# Patient Record
Sex: Female | Born: 1945 | Race: White | Hispanic: No | Smoking: Current every day smoker
Health system: Southern US, Community
[De-identification: ages and names within clinical notes are randomized; demographics above are authoritative.]

## PROBLEM LIST (undated history)

## (undated) DIAGNOSIS — I10 Essential (primary) hypertension: Secondary | ICD-10-CM

## (undated) DIAGNOSIS — E785 Hyperlipidemia, unspecified: Secondary | ICD-10-CM

## (undated) DIAGNOSIS — F419 Anxiety disorder, unspecified: Secondary | ICD-10-CM

## (undated) DIAGNOSIS — J40 Bronchitis, not specified as acute or chronic: Secondary | ICD-10-CM

## (undated) DIAGNOSIS — M810 Age-related osteoporosis without current pathological fracture: Secondary | ICD-10-CM

## (undated) DIAGNOSIS — H9190 Unspecified hearing loss, unspecified ear: Secondary | ICD-10-CM

## (undated) DIAGNOSIS — G709 Myoneural disorder, unspecified: Secondary | ICD-10-CM

## (undated) DIAGNOSIS — R0600 Dyspnea, unspecified: Secondary | ICD-10-CM

## (undated) DIAGNOSIS — K219 Gastro-esophageal reflux disease without esophagitis: Secondary | ICD-10-CM

## (undated) DIAGNOSIS — J302 Other seasonal allergic rhinitis: Secondary | ICD-10-CM

## (undated) DIAGNOSIS — J449 Chronic obstructive pulmonary disease, unspecified: Secondary | ICD-10-CM

## (undated) HISTORY — PX: TONSILLECTOMY: SUR1361

## (undated) HISTORY — DX: Gastro-esophageal reflux disease without esophagitis: K21.9

## (undated) HISTORY — DX: Hyperlipidemia, unspecified: E78.5

## (undated) HISTORY — PX: TUBAL LIGATION: SHX77

## (undated) HISTORY — DX: Other seasonal allergic rhinitis: J30.2

## (undated) HISTORY — PX: BACK SURGERY: SHX140

---

## 1998-05-27 ENCOUNTER — Other Ambulatory Visit: Admission: RE | Admit: 1998-05-27 | Discharge: 1998-05-27 | Payer: Self-pay | Admitting: Obstetrics and Gynecology

## 1999-05-31 ENCOUNTER — Other Ambulatory Visit: Admission: RE | Admit: 1999-05-31 | Discharge: 1999-05-31 | Payer: Self-pay | Admitting: Obstetrics and Gynecology

## 2000-05-31 ENCOUNTER — Other Ambulatory Visit: Admission: RE | Admit: 2000-05-31 | Discharge: 2000-05-31 | Payer: Self-pay | Admitting: *Deleted

## 2001-06-06 ENCOUNTER — Other Ambulatory Visit: Admission: RE | Admit: 2001-06-06 | Discharge: 2001-06-06 | Payer: Self-pay | Admitting: *Deleted

## 2002-06-12 ENCOUNTER — Other Ambulatory Visit: Admission: RE | Admit: 2002-06-12 | Discharge: 2002-06-12 | Payer: Self-pay | Admitting: Internal Medicine

## 2004-08-29 ENCOUNTER — Ambulatory Visit: Payer: Self-pay | Admitting: Internal Medicine

## 2004-09-28 ENCOUNTER — Ambulatory Visit: Payer: Self-pay | Admitting: Internal Medicine

## 2005-05-03 ENCOUNTER — Ambulatory Visit: Payer: Self-pay | Admitting: Internal Medicine

## 2005-05-23 ENCOUNTER — Ambulatory Visit: Payer: Self-pay | Admitting: Internal Medicine

## 2005-08-21 ENCOUNTER — Ambulatory Visit: Payer: Self-pay | Admitting: Internal Medicine

## 2005-09-09 ENCOUNTER — Emergency Department (HOSPITAL_COMMUNITY): Admission: EM | Admit: 2005-09-09 | Discharge: 2005-09-09 | Payer: Self-pay | Admitting: Family Medicine

## 2007-04-10 ENCOUNTER — Encounter: Admission: RE | Admit: 2007-04-10 | Discharge: 2007-04-10 | Payer: Self-pay | Admitting: Orthopedic Surgery

## 2008-03-21 ENCOUNTER — Emergency Department (HOSPITAL_COMMUNITY): Admission: EM | Admit: 2008-03-21 | Discharge: 2008-03-21 | Payer: Self-pay | Admitting: Emergency Medicine

## 2012-03-24 ENCOUNTER — Encounter (HOSPITAL_COMMUNITY): Payer: Self-pay | Admitting: Family Medicine

## 2012-03-24 ENCOUNTER — Emergency Department (HOSPITAL_COMMUNITY)
Admission: EM | Admit: 2012-03-24 | Discharge: 2012-03-24 | Disposition: A | Discharge status: Home / Self Care | Payer: Medicare Other | Attending: Emergency Medicine | Admitting: Emergency Medicine

## 2012-03-24 ENCOUNTER — Emergency Department (HOSPITAL_COMMUNITY): Payer: Medicare Other

## 2012-03-24 DIAGNOSIS — S92919A Unspecified fracture of unspecified toe(s), initial encounter for closed fracture: Secondary | ICD-10-CM | POA: Insufficient documentation

## 2012-03-24 DIAGNOSIS — X58XXXA Exposure to other specified factors, initial encounter: Secondary | ICD-10-CM | POA: Insufficient documentation

## 2012-03-24 DIAGNOSIS — Y92009 Unspecified place in unspecified non-institutional (private) residence as the place of occurrence of the external cause: Secondary | ICD-10-CM | POA: Insufficient documentation

## 2012-03-24 DIAGNOSIS — F17200 Nicotine dependence, unspecified, uncomplicated: Secondary | ICD-10-CM | POA: Insufficient documentation

## 2012-03-24 DIAGNOSIS — I10 Essential (primary) hypertension: Secondary | ICD-10-CM | POA: Insufficient documentation

## 2012-03-24 DIAGNOSIS — S92514A Nondisplaced fracture of proximal phalanx of right lesser toe(s), initial encounter for closed fracture: Secondary | ICD-10-CM

## 2012-03-24 DIAGNOSIS — J4489 Other specified chronic obstructive pulmonary disease: Secondary | ICD-10-CM | POA: Insufficient documentation

## 2012-03-24 DIAGNOSIS — J449 Chronic obstructive pulmonary disease, unspecified: Secondary | ICD-10-CM | POA: Insufficient documentation

## 2012-03-24 DIAGNOSIS — E7800 Pure hypercholesterolemia, unspecified: Secondary | ICD-10-CM | POA: Insufficient documentation

## 2012-03-24 HISTORY — DX: Essential (primary) hypertension: I10

## 2012-03-24 HISTORY — DX: Chronic obstructive pulmonary disease, unspecified: J44.9

## 2012-03-24 MED ORDER — IBUPROFEN 200 MG PO TABS
600.0000 mg | ORAL_TABLET | Freq: Once | ORAL | Status: AC
  Administered 2012-03-24: 600 mg via ORAL
  Filled 2012-03-24: qty 3

## 2012-03-24 NOTE — Discharge Instructions (Signed)
As we discussed, take ibuprofen 600mg  every 8 hours with food for pain and to reduce swelling. Apply ice to your injured areas for 15-20 minutes three times a day for the next several days to help reduce swelling and inflammation.  Wear the shoe when walking. Change the tape as needed.  Toe Fracture Your caregiver has diagnosed you as having a fractured toe. A toe fracture is a break in the bone of a toe. "Buddy taping" is a way of splinting your broken toe, by taping the broken toe to the toe next to it. This "buddy taping" will keep the injured toe from moving beyond normal range of motion. Buddy taping also helps the toe heal in a more normal alignment. It may take 6 to 8 weeks for the toe injury to heal. HOME CARE INSTRUCTIONS   Leave your toes taped together for as long as directed by your caregiver or until you see a doctor for a follow-up examination. You can change the tape after bathing. Always use a small piece of gauze or cotton between the toes when taping them together. This will help the skin stay dry and prevent infection.   Apply ice to the injury for 15 to 20 minutes each hour while awake for the first 2 days. Put the ice in a plastic bag and place a towel between the bag of ice and your skin.   After the first 2 days, apply heat to the injured area. Use heat for the next 2 to 3 days. Place a heating pad on the foot or soak the foot in warm water as directed by your caregiver.   Keep your foot elevated as much as possible to lessen swelling.   Wear sturdy, supportive shoes. The shoes should not pinch the toes or fit tightly against the toes.   Your caregiver may prescribe a rigid shoe if your foot is very swollen.   Your may be given crutches if the pain is too great and it hurts too much to walk.   Only take over-the-counter or prescription medicines for pain, discomfort, or fever as directed by your caregiver.   If your caregiver has given you a follow-up appointment, it  is very important to keep that appointment. Not keeping the appointment could result in a chronic or permanent injury, pain, and disability. If there is any problem keeping the appointment, you must call back to this facility for assistance.  SEEK MEDICAL CARE IF:   You have increased pain or swelling, not relieved with medications.   The pain does not get better after 1 week.   Your injured toe is cold when the others are warm.  SEEK IMMEDIATE MEDICAL CARE IF:   The toe becomes cold, numb, or white.   The toe becomes hot (inflamed) and red.  Document Released: 09/22/2000 Document Revised: 09/14/2011 Document Reviewed: 05/11/2008 [facility] Patient Information 2012 [place], [place].

## 2012-03-24 NOTE — ED Notes (Signed)
Pt complaining of right foot pain. sts she tripped and fell over a trunk in the hall last night. Pt has right foot pain and swelling.

## 2012-03-24 NOTE — ED Notes (Signed)
NAD noted at time of d/c home 

## 2012-03-24 NOTE — Progress Notes (Signed)
Orthopedic Tech Progress Note Patient Details:  Morgan [name] [birth year] [id number]  Ortho Devices Type of Ortho Device: Buddy tape;Postop boot Ortho Device/Splint Interventions: Application   Shawnie Pons 03/24/2012, 10:17 AM

## 2012-03-24 NOTE — ED Provider Notes (Signed)
History     CSN: [id number]  Arrival date & time 03/24/12  0848   First MD Initiated Contact with Patient 03/24/12 [id number]      Chief Complaint  Patient presents with  . Foot Pain    (Consider location/radiation/quality/duration/timing/severity/associated sxs/prior treatment) The history is provided by the patient.   66 year old female presents to the emergency department with a chief complaint of right foot pain after she tripped over her trunk in her hallway last night. Denies any other injuries sustained in the fall. Was initially able to ambulating but pain has become severe enough that she can no longer do this. Porch associated decreased sensation to the toe but is able to move all toes and the ankle with pain. Denies any wounds, but does endorse color change to the foot. Has taken Excedrin Migraine for the pain with minimal relief. Applied ice this morning.  Past Medical History  Diagnosis Date  . COPD (chronic obstructive pulmonary disease)   . Hypertension   . High cholesterol     Past Surgical History  Procedure Date  . Back surgery     History reviewed. No pertinent family history.  History  Substance Use Topics  . Smoking status: Current Everyday Smoker  . Smokeless tobacco: Not on file  . Alcohol Use: Yes     Review of Systems  Constitutional: Negative for fever.  Musculoskeletal:       See HPI  Skin: Positive for color change. Negative for wound.  Neurological: Positive for numbness. Negative for dizziness, syncope, weakness and headaches.    Allergies  Codeine  Home Medications   Current Outpatient Rx  Name Route Sig Dispense Refill  . ASPIRIN-ACETAMINOPHEN-CAFFEINE 250-250-65 MG PO TABS Oral Take 1 tablet by mouth every 6 (six) hours as needed. For headache    . CITALOPRAM HYDROBROMIDE 20 MG PO TABS Oral Take 20 mg by mouth daily.    Marland Kitchen LISINOPRIL 20 MG PO TABS Oral Take 20 mg by mouth daily.    Marland Kitchen METOPROLOL SUCCINATE ER 25 MG PO TB24 Oral Take 25  mg by mouth daily.    Marland Kitchen OMEPRAZOLE 20 MG PO CPDR Oral Take 20 mg by mouth daily.    Marland Kitchen SIMVASTATIN 40 MG PO TABS Oral Take 40 mg by mouth every evening.      BP 136/70  Pulse 66  Temp 98.3 F (36.8 C) (Oral)  Resp 20  SpO2 95%  Physical Exam  Constitutional: She appears well-developed and well-nourished. No distress.       Vital signs are reviewed and are normal.   HENT:  Head: Normocephalic and atraumatic.  Neck: Neck supple.  Cardiovascular: Normal rate and regular rhythm.        Bilateral radial and DP pulses are 2+   Pulmonary/Chest: No respiratory distress.  Musculoskeletal:       Right knee: no tenderness found.       Left knee: no tenderness found.       Right ankle: Normal. No head of 5th metatarsal tenderness found.       Left ankle: no tenderness.       Right foot: She exhibits tenderness and swelling.       Left foot: She exhibits no tenderness.       Feet:  Neurological: She is alert.       Sensation intact to light touch in BLE (present but altered compared to contralateral over the 5th digit)  Skin: Skin is warm and dry.  ED Course  Procedures (including critical care time)  Labs Reviewed - No data to display Dg Foot Complete Right  03/24/2012  *RADIOLOGY REPORT*  Clinical Data: History of injury complaining of right foot pain.  RIGHT FOOT COMPLETE - 3+ VIEW  Comparison: No priors.  Findings: There is an acute nondisplaced fracture through the proximal phalanx of the fifth digit, with approximately 5 degrees of lateral angulation.  No other acute fracture, subluxation, dislocation, joint or soft tissue abnormality is noted.  IMPRESSION: 1.  Acute nondisplaced minimally angulated fracture through the proximal phalanx of the right fifth toe.  Original Report Authenticated By: Florencia Reasons, M.D.     1. Closed nondisp fx of proximal phalanx of lesser toe of right foot       MDM  Mechanical fall, right foot injury with 3 areas of bruising.  Imaging study reviewed with pt, proximal 5th phalanx fx, non-displaced. Buddy tape, post-op shoe applied. RICE discussed. Pt to be d/c home.        Shaaron Adler, PA-C 03/24/12 1009

## 2012-03-26 NOTE — ED Provider Notes (Signed)
Medical screening examination/treatment/procedure(s) were performed by non-physician practitioner and as supervising physician I was immediately available for consultation/collaboration.  Coulter Oldaker R. Avalynn Bowe, MD 03/26/12 1459 

## 2015-09-08 ENCOUNTER — Encounter: Payer: Self-pay | Admitting: Primary Care

## 2015-09-08 ENCOUNTER — Ambulatory Visit (INDEPENDENT_AMBULATORY_CARE_PROVIDER_SITE_OTHER): Payer: Commercial Managed Care - HMO | Admitting: Primary Care

## 2015-09-08 ENCOUNTER — Ambulatory Visit (INDEPENDENT_AMBULATORY_CARE_PROVIDER_SITE_OTHER)
Admission: RE | Admit: 2015-09-08 | Discharge: 2015-09-08 | Disposition: A | Discharge status: Home / Self Care | Payer: Commercial Managed Care - HMO | Source: Ambulatory Visit | Attending: Primary Care | Admitting: Primary Care

## 2015-09-08 ENCOUNTER — Encounter (INDEPENDENT_AMBULATORY_CARE_PROVIDER_SITE_OTHER): Payer: Self-pay

## 2015-09-08 ENCOUNTER — Telehealth: Payer: Self-pay

## 2015-09-08 VITALS — BP 120/82 | HR 75 | Temp 98.1°F | Ht 61.0 in | Wt 135.1 lb

## 2015-09-08 DIAGNOSIS — N390 Urinary tract infection, site not specified: Secondary | ICD-10-CM | POA: Diagnosis not present

## 2015-09-08 DIAGNOSIS — K219 Gastro-esophageal reflux disease without esophagitis: Secondary | ICD-10-CM | POA: Insufficient documentation

## 2015-09-08 DIAGNOSIS — F329 Major depressive disorder, single episode, unspecified: Secondary | ICD-10-CM | POA: Insufficient documentation

## 2015-09-08 DIAGNOSIS — F32A Depression, unspecified: Secondary | ICD-10-CM | POA: Insufficient documentation

## 2015-09-08 DIAGNOSIS — I10 Essential (primary) hypertension: Secondary | ICD-10-CM | POA: Diagnosis not present

## 2015-09-08 DIAGNOSIS — F419 Anxiety disorder, unspecified: Secondary | ICD-10-CM

## 2015-09-08 DIAGNOSIS — F411 Generalized anxiety disorder: Secondary | ICD-10-CM

## 2015-09-08 DIAGNOSIS — E785 Hyperlipidemia, unspecified: Secondary | ICD-10-CM | POA: Insufficient documentation

## 2015-09-08 DIAGNOSIS — R109 Unspecified abdominal pain: Secondary | ICD-10-CM | POA: Diagnosis not present

## 2015-09-08 LAB — POCT URINALYSIS DIPSTICK
Bilirubin, UA: NEGATIVE
Blood, UA: NEGATIVE
Glucose, UA: NEGATIVE
Nitrite, UA: NEGATIVE
Spec Grav, UA: 1.02
Urobilinogen, UA: NEGATIVE
pH, UA: 6

## 2015-09-08 MED ORDER — CEPHALEXIN 250 MG PO CAPS: 250.0000 mg | ORAL_CAPSULE | Freq: Two times a day (BID) | ORAL | Status: DC

## 2015-09-08 NOTE — Telephone Encounter (Signed)
Pt left v/m keflex not at [facility]; was sent in error to [facility]. Sent electronically to [facility]; apologized to pt. Spoke with Georgina Snell at [place] to cancel rx.-

## 2015-09-08 NOTE — Assessment & Plan Note (Signed)
Managed on omeprazole 40 mg. Discussed risks of decreased bone density. Will obtain records for bone density report.

## 2015-09-08 NOTE — Assessment & Plan Note (Signed)
Stable today. Current managed on toprol XL 25 mg and lisinopril/hctz 10/12.5 mg. Continue current regimen, Will review BMP from records.

## 2015-09-08 NOTE — Assessment & Plan Note (Signed)
Managed on simvastatin 40 mg. Will obtain records for lipid panel.

## 2015-09-08 NOTE — Patient Instructions (Signed)
Start Cephalexin antibiotics. Take 1 capsule by mouth twice daily for 7 days.  Complete xray(s) prior to leaving today. I will contact you regarding your results.  It was a pleasure to meet you today! Please don't hesitate to call me with any questions. Welcome to [facility]!

## 2015-09-08 NOTE — Progress Notes (Signed)
Subjective:    [name], female    DOB: 10/02/1946, 69 y.o.   MRN: [id number]  HPI  Morgan [name] is a 69 year old female who presents today to establish care and discuss the problems mentioned below. Will obtain old records. Her last physical was in September 2016.   1) Flank pain: Her symptoms began 3 weeks ago with left groin and flank pain. She then noticed a foul odor to her urine. She took AZO for 1 week. She then experienced severe, sharp, "labor" pain to her lower back. Over the past 3 weeks she's had a dull, cramping pain to her bilateral groin with intermittent "attacks" of low back pain and nauesa. Overall attacks are less painful. During her first pain attack she had nausea, vomiting, weakness, diaphoresis. She's not had her lower back "attacks" since Sunday this week. She's been taking ibuprofen.   2) Essential Hypertension: Diagnosed 10 years ago. Currently managed on lisinopril/HCTZ 10/12.5 mg and toprol XL 25 mg. Denies chest pain, shortness of breath.  3) Hyperlipidemia: Diagnosed 10 years ago. Currently managed on simvastatin 40 mg.  4) Generalized Anxiety Disorder: Currently managed on Xanax 1 mg from prior PCP, will typically take 1/2 tablet every morning. Feels well managed at this dose.  Review of Systems  Constitutional: Positive for chills. Negative for fever.  HENT: Negative for rhinorrhea.   Respiratory: Negative for cough and shortness of breath.   Cardiovascular: Negative for chest pain.  Gastrointestinal: Positive for nausea. Negative for abdominal pain.  Genitourinary: Positive for frequency and flank pain. Negative for dysuria, urgency, hematuria and vaginal discharge.       Foul odor to her urine  Musculoskeletal:       Chronic back pain  Skin: Negative for rash.  Allergic/Immunologic: Positive for environmental allergies.  Neurological: Positive for dizziness. Negative for numbness.  Psychiatric/Behavioral:       Se HPI       Past  Medical History  Diagnosis Date  . COPD (chronic obstructive pulmonary disease) ([place])   . Hypertension   . Hyperlipidemia   . GERD (gastroesophageal reflux disease)   . Seasonal allergies     Social History   Social History  . Marital Status: Married    Spouse Name: N/A  . Number of Children: N/A  . Years of Education: N/A   Occupational History  . Not on file.   Social History Main Topics  . Smoking status: Current Every Day Smoker  . Smokeless tobacco: Not on file  . Alcohol Use: 0.0 oz/week    0 Standard drinks or equivalent per week     Comment: 2 to 3 beer a day  . Drug Use: Not on file  . Sexual Activity: Not on file   Other Topics Concern  . Not on file   Social History Narrative   Married.   2 children, 3 grandchildren.   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   Enjoys spending time with her family, going to the beach and movies.     Past Surgical History  Procedure Laterality Date  . Back surgery      No family history on file.  Allergies  Allergen Reactions  . Codeine Hives    Makes me crazy    Current Outpatient Prescriptions on File Prior to Visit  Medication Sig Dispense Refill  . metoprolol succinate (TOPROL-XL) 25 MG 24 hr tablet Take 25 mg by mouth daily.    . simvastatin (ZOCOR)  40 MG tablet Take 40 mg by mouth every evening.     No current facility-administered medications on file prior to visit.    BP 120/82 mmHg  Pulse 75  Temp(Src) 98.1 F (36.7 C) (Oral)  Ht 5\' 1"  (1.549 m)  Wt 135 lb 1.9 oz (61.29 kg)  BMI 25.54 kg/m2  SpO2 97%    Objective:   Physical Exam  Constitutional: She is oriented to person, place, and time. She appears well-nourished.  Neck: Neck supple.  Cardiovascular: Normal rate and regular rhythm.   Pulmonary/Chest: Effort normal and breath sounds normal.  Abdominal: Soft. Bowel sounds are normal. There is tenderness in the suprapubic area. There is CVA tenderness.  Neurological: She is alert  and oriented to person, place, and time.  Skin: Skin is warm and dry.  Psychiatric: She has a normal mood and affect.          Assessment & Plan:  Flank pain:  Present for 3 weeks with "attacks" of severe pain with nausea. Overall attacks are improved, last attack Sunday this week. Suspect renal stone as she had some CVA tenderness to right side and based off of HPI. Sounds like she may have passed the stone; however, will get KUB today to rule out any other stone, UA: trace leuks, positive for protein. No blood or nitrites. Will go ahead and treat with antibiotics due to duration and continued tenderness. Culture sent. Fluids, rest. Return precautions provided.

## 2015-09-08 NOTE — Progress Notes (Signed)
Pre visit review using our clinic review tool, if applicable. No additional management support is needed unless otherwise documented below in the visit note. 

## 2015-09-08 NOTE — Assessment & Plan Note (Signed)
Currently managed on 0.5 mg of xanax every morning for years from prior PCP. Feels well managed on this regimen. Will obtain UDS and controlled substance contract for refills.

## 2015-09-10 LAB — URINE CULTURE
Colony Count: NO GROWTH
Organism ID, Bacteria: NO GROWTH

## 2015-09-13 ENCOUNTER — Telehealth: Payer: Self-pay | Admitting: Primary Care

## 2015-09-13 ENCOUNTER — Other Ambulatory Visit (INDEPENDENT_AMBULATORY_CARE_PROVIDER_SITE_OTHER): Payer: Commercial Managed Care - HMO

## 2015-09-13 DIAGNOSIS — R109 Unspecified abdominal pain: Secondary | ICD-10-CM

## 2015-09-13 NOTE — Telephone Encounter (Signed)
Called and notified patient of Kate's comments. Patient verbalized understanding. Patient stated that someone has already called her and have her labs done. She is waiting for a call back.

## 2015-09-13 NOTE — Telephone Encounter (Signed)
Morgan [name], Please notify [name] that we will need to complete a CT scan of her kidneys for further evaluation. She will also needs labs.  [name] or [name], can they do labs at [facility] on [address]? If not she can go to [facility].

## 2015-09-13 NOTE — Telephone Encounter (Signed)
Noted. Will be on the look out for results.

## 2015-09-13 NOTE — Telephone Encounter (Signed)
Pt is bnot feeling better, she had a bad attack on Friday . cb number is [phone] Pt request cb  Thank you

## 2015-09-14 ENCOUNTER — Telehealth: Payer: Self-pay | Admitting: *Deleted

## 2015-09-14 ENCOUNTER — Ambulatory Visit (HOSPITAL_COMMUNITY)
Admission: RE | Admit: 2015-09-14 | Discharge: 2015-09-14 | Disposition: A | Discharge status: Home / Self Care | Payer: Commercial Managed Care - HMO | Source: Ambulatory Visit | Attending: Primary Care | Admitting: Primary Care

## 2015-09-14 ENCOUNTER — Other Ambulatory Visit: Payer: Self-pay | Admitting: Primary Care

## 2015-09-14 DIAGNOSIS — I776 Arteritis, unspecified: Secondary | ICD-10-CM

## 2015-09-14 DIAGNOSIS — R109 Unspecified abdominal pain: Secondary | ICD-10-CM

## 2015-09-14 LAB — COMPREHENSIVE METABOLIC PANEL
ALT: 14 U/L (ref 0–35)
AST: 24 U/L (ref 0–37)
Albumin: 4 g/dL (ref 3.5–5.2)
Alkaline Phosphatase: 80 U/L (ref 39–117)
BUN: 14 mg/dL (ref 6–23)
CO2: 30 mEq/L (ref 19–32)
Calcium: 9.3 mg/dL (ref 8.4–10.5)
Chloride: 97 mEq/L (ref 96–112)
Creatinine, Ser: 0.78 mg/dL (ref 0.40–1.20)
GFR: 78.07 mL/min (ref >60.00)
Glucose, Bld: 189 mg/dL — ABNORMAL HIGH (ref 70–99)
Potassium: 3.7 mEq/L (ref 3.5–5.1)
Sodium: 135 mEq/L (ref 135–145)
Total Bilirubin: 0.3 mg/dL (ref 0.2–1.2)
Total Protein: 6.9 g/dL (ref 6.0–8.3)

## 2015-09-14 LAB — CBC WITH DIFFERENTIAL/PLATELET
Basophils Absolute: 0.1 10*3/uL (ref 0.0–0.1)
Basophils Relative: 0.6 % (ref 0.0–3.0)
Eosinophils Absolute: 0.1 10*3/uL (ref 0.0–0.7)
Eosinophils Relative: 1.3 % (ref 0.0–5.0)
HCT: 36.9 % (ref 36.0–46.0)
Hemoglobin: 12.2 g/dL (ref 12.0–15.0)
Lymphocytes Relative: 32.7 % (ref 12.0–46.0)
Lymphs Abs: 3.3 10*3/uL (ref 0.7–4.0)
MCHC: 33.1 g/dL (ref 30.0–36.0)
MCV: 95.7 fl (ref 78.0–100.0)
Monocytes Absolute: 0.8 10*3/uL (ref 0.1–1.0)
Monocytes Relative: 7.5 % (ref 3.0–12.0)
Neutro Abs: 5.8 10*3/uL (ref 1.4–7.7)
Neutrophils Relative %: 57.9 % (ref 43.0–77.0)
Platelets: 482 10*3/uL — ABNORMAL HIGH (ref 150.0–400.0)
RBC: 3.86 Mil/uL — ABNORMAL LOW (ref 3.87–5.11)
RDW: 12.4 % (ref 11.5–15.5)
WBC: 10.1 10*3/uL (ref 4.0–10.5)

## 2015-09-14 NOTE — Telephone Encounter (Signed)
Notified patient of results. Ordered CTA and blood work for work up of aortitis. Patient verbalized understanding of the plan.

## 2015-09-14 NOTE — Telephone Encounter (Signed)
Call report from [place] at [facility].  CT renal stone study:  IMPRESSION: 1. 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 Recommend CTA follow-up.

## 2015-09-14 NOTE — Telephone Encounter (Signed)
Attempted to contact patient regarding CT results, message left for return call. Will do follow up CTA.

## 2015-09-15 ENCOUNTER — Ambulatory Visit (HOSPITAL_COMMUNITY)
Admission: RE | Admit: 2015-09-15 | Discharge: 2015-09-15 | Disposition: A | Discharge status: Home / Self Care | Payer: Commercial Managed Care - HMO | Source: Ambulatory Visit | Attending: Primary Care | Admitting: Primary Care

## 2015-09-15 ENCOUNTER — Encounter (HOSPITAL_COMMUNITY): Payer: Self-pay

## 2015-09-15 ENCOUNTER — Other Ambulatory Visit (INDEPENDENT_AMBULATORY_CARE_PROVIDER_SITE_OTHER): Payer: Commercial Managed Care - HMO

## 2015-09-15 ENCOUNTER — Other Ambulatory Visit: Payer: Self-pay | Admitting: Primary Care

## 2015-09-15 DIAGNOSIS — R109 Unspecified abdominal pain: Secondary | ICD-10-CM | POA: Diagnosis not present

## 2015-09-15 DIAGNOSIS — I719 Aortic aneurysm of unspecified site, without rupture: Secondary | ICD-10-CM | POA: Diagnosis not present

## 2015-09-15 DIAGNOSIS — K5730 Diverticulosis of large intestine without perforation or abscess without bleeding: Secondary | ICD-10-CM | POA: Diagnosis not present

## 2015-09-15 DIAGNOSIS — I776 Arteritis, unspecified: Secondary | ICD-10-CM

## 2015-09-15 DIAGNOSIS — M4854XA Collapsed vertebra, not elsewhere classified, thoracic region, initial encounter for fracture: Secondary | ICD-10-CM | POA: Insufficient documentation

## 2015-09-15 LAB — RHEUMATOID FACTOR: Rhuematoid fact SerPl-aCnc: <10 IU/mL (ref <=14)

## 2015-09-15 LAB — SEDIMENTATION RATE: Sed Rate: 18 mm/hr (ref 0–22)

## 2015-09-15 MED ORDER — IOHEXOL 350 MG/ML SOLN
100.0000 mL | Freq: Once | INTRAVENOUS | Status: AC | PRN
  Administered 2015-09-15: 100 mL via INTRAVENOUS

## 2015-09-15 NOTE — Progress Notes (Signed)
Report called to Alma Friendly,  NP, Belenda Cruise spoke to patient an advised her of the results.

## 2015-09-16 ENCOUNTER — Encounter: Payer: Self-pay | Admitting: Vascular Surgery

## 2015-09-16 LAB — ANA: Anti Nuclear Antibody(ANA): NEGATIVE

## 2015-09-17 ENCOUNTER — Ambulatory Visit (INDEPENDENT_AMBULATORY_CARE_PROVIDER_SITE_OTHER): Payer: Commercial Managed Care - HMO | Admitting: Vascular Surgery

## 2015-09-17 ENCOUNTER — Encounter: Payer: Self-pay | Admitting: Vascular Surgery

## 2015-09-17 VITALS — BP 153/90 | HR 80 | Ht 61.0 in | Wt 135.4 lb

## 2015-09-17 DIAGNOSIS — I700 Atherosclerosis of aorta: Secondary | ICD-10-CM | POA: Insufficient documentation

## 2015-09-17 NOTE — Addendum Note (Signed)
Addended by: Thresa Ross C on: 09/17/2015 02:09 PM   Modules accepted: Orders

## 2015-09-17 NOTE — Progress Notes (Signed)
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 (12-14-1945) female who presents with chief complaint: "tear in aorta".  Patient was seen in ED with abdominal vs back pain.  CT suggested aortic ulcer.  Subsequent CTA confirmed the diagnosis.  The patient described her pain as severe back/flank pain which has been intermittent without a trigger.  This pain has been described as pressure in the pelvis like a menstrual cramp and also as sharp back pain, like a tear.  This pain improved after vomitting.  The patient denies any fever or chills.  She denies any diarrhea or nausea currently.    Past Medical History  Diagnosis Date  . COPD (chronic obstructive pulmonary disease) ([place])   . Hypertension   . Hyperlipidemia   . GERD (gastroesophageal reflux disease)   . Seasonal allergies     Past Surgical History  Procedure Laterality Date  . Back surgery      Social History   Social History  . Marital Status: Married    Spouse Name: N/A  . Number of Children: N/A  . Years of Education: N/A   Occupational History  . Not on file.   Social History Main Topics  . Smoking status: Current Every Day Smoker -- 1.00 packs/day    Types: Cigarettes  . Smokeless tobacco: Not on file  . Alcohol Use: 0.0 oz/week    0 Standard drinks or equivalent per week     Comment: 2 to 3 beer a day  . Drug Use: No  . Sexual Activity: Not on file   Other Topics Concern  . Not on file   Social History Narrative   Married.   2 children, 3 grandchildren.   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   Enjoys spending time with her family, going to the beach and movies.     Family History  Problem Relation Age of Onset  . Heart disease Father     before age 68    Current Outpatient Prescriptions  Medication Sig Dispense Refill  . ALPRAZolam (XANAX) 1 MG tablet Take  1/2 tablet in the morning and 1/2 in the evening    . cephALEXin (KEFLEX) 250 MG capsule Take 1 capsule (250 mg total) by mouth 2 (two) times daily. 14 capsule 0  . Cholecalciferol (D-3-5) 5000 UNITS capsule Take 5,000 Units by mouth daily.    . diphenhydrAMINE (BENADRYL) 25 mg capsule Take 25 mg by mouth every 6 (six) hours as needed.    Marland Kitchen ibuprofen (ADVIL,MOTRIN) 200 MG tablet Take 200 mg by mouth every 6 (six) hours as needed.    Marland Kitchen lisinopril-hydrochlorothiazide (PRINZIDE,ZESTORETIC) 10-12.5 MG tablet Take 1 tablet by mouth daily.     . metoprolol succinate (TOPROL-XL) 25 MG 24 hr tablet Take 25 mg by mouth daily.    . Multiple Vitamin (MULTIVITAMIN) capsule Take 1 capsule by mouth daily.    Marland Kitchen omeprazole (PRILOSEC) 40 MG capsule Take 40 mg by mouth daily.     . simvastatin (ZOCOR) 40 MG tablet Take 40 mg by mouth every evening.    Marland Kitchen FLUZONE HIGH-DOSE 0.5 ML SUSY      No current facility-administered medications for this visit.     Allergies  Allergen Reactions  . Codeine Hives    Makes me crazy     REVIEW OF SYSTEMS:  (  Positives checked otherwise negative)  CARDIOVASCULAR:   '[ ]'$  chest pain,  $Remo'[ ]'GmwFx$  chest pressure,  $RemoveBe'[ ]'HqXkGHcrZ$  palpitations,  $RemoveBefore'[ ]'wjjzTTQSruafi$  shortness of breath when laying flat,  $Remo'[ ]'RMIWj$  shortness of breath with exertion,   '[ ]'$  pain in feet when walking,  $RemoveB'[ ]'bsoiVcLr$  pain in feet when laying flat, $RemoveBefo'[ ]'afzhzcDGIwk$  history of blood clot in veins (DVT),  $Remov'[ ]'Qfajbn$  history of phlebitis,  $RemoveBef'[ ]'acREafcqAj$  swelling in legs,  $Remo'[ ]'NWveE$  varicose veins  PULMONARY:   '[x]'$  productive cough,  $Remov'[ ]'ZVISbG$  asthma,  $Remove'[x]'EahUoOB$  wheezing  NEUROLOGIC:   '[ ]'$  weakness in arms or legs,  $Remo'[ ]'fDNUD$  numbness in arms or legs,  $Remo'[ ]'malJC$  difficulty speaking or slurred speech,  $Remove'[ ]'AhIiruX$  temporary loss of vision in one eye,  $Rem'[x]'WelM$  dizziness  HEMATOLOGIC:   '[ ]'$  bleeding problems,  $RemoveBe'[ ]'kZYnYDIOQ$  problems with blood clotting too easily  MUSCULOSKEL:   '[ ]'$  joint pain, $RemoveBef'[ ]'WXenWVtPTm$  joint swelling  GASTROINTEST:   '[ ]'$  vomiting blood,  $Remov'[ ]'QemQkq$  blood in stool     GENITOURINARY:   '[ ]'$  burning with  urination,  $RemoveBef'[ ]'TqvKeitzmE$  blood in urine  PSYCHIATRIC:   '[ ]'$  history of major depression  INTEGUMENTARY:   '[ ]'$  rashes,  $Remove'[ ]'rudQHvY$  ulcers  CONSTITUTIONAL:   '[ ]'$  fever,  $Remov'[ ]'jWKDDS$  chills   For VQI Use Only  PRE-ADM LIVING: Home  AMB STATUS: Ambulatory  CAD Sx: None  PRIOR CHF: None  STRESS TEST: $RemoveBefo'[x]'ciPWJOBHYrz$  No, $Re'[ ]'ydY$  Normal, $Remove'[ ]'AZfvpbg$  + ischemia, $RemoveBef'[ ]'JeRMEwgjVV$  + MI, $Rem'[ ]'CjDj$  Both   Physical Examination  Filed Vitals:   09/17/15 0858 09/17/15 0859  BP: 167/85 153/90  Pulse: 80   Height: $Remove'5\' 1"'SximZvd$  (1.549 m)   Weight: 135 lb 6.4 oz (61.417 kg)   SpO2: 99%    Body mass index is 25.6 kg/(m^2).  General: A&O x 3, WDWN  Head: [place]/AT  Ear/Nose/Throat: Hearing grossly intact, nares w/o erythema or drainage, oropharynx w/o Erythema/Exudate, Mallampati score: 3  Eyes: PERRLA, EOMI  Neck: Supple, no nuchal rigidity, no palpable LAD  Pulmonary: Sym exp, good air movt, CTAB, no rales, rhonchi, & wheezing  Cardiac: RRR, Nl S1, S2, no Murmurs, rubs or gallops  Vascular: Vessel Right Left  Radial Palpable Palpable  Brachial Palpable Palpable  Carotid Palpable, without bruit Palpable, without bruit  Aorta Not palpable N/A  Femoral Palpable Palpable  Popliteal Not palpable Not palpable  PT Palpable Palpable  DP Palpable Palpable   Gastrointestinal: soft, NTND, no G/R, no HSM, no masses, no CVAT B, somewhat ticklish on exam, no flank bruits,  Musculoskeletal: M/S 5/5 throughout , Extremities without ischemic changes   Neurologic: CN 2-12 intact , Pain and light touch intact in extremities , Motor exam as listed above  Psychiatric: Judgment intact, Mood & affect appropriate for pt's clinical situation  Dermatologic: See M/S exam for extremity exam, no rashes otherwise noted  Lymph : No Cervical, Axillary, or Inguinal lymphadenopathy   Laboratory: CBC:    Component Value Date/Time   WBC 10.1 09/13/2015 1544   RBC 3.86* 09/13/2015 1544   HGB 12.2 09/13/2015 1544   HCT 36.9 09/13/2015 1544   PLT 482.0* 09/13/2015  1544   MCV 95.7 09/13/2015 1544   MCHC 33.1 09/13/2015 1544   RDW 12.4 09/13/2015 1544   LYMPHSABS 3.3 09/13/2015 1544   MONOABS 0.8 09/13/2015 1544   EOSABS 0.1 09/13/2015 1544   BASOSABS 0.1 09/13/2015 1544    BMP:  Component Value Date/Time   NA 135 09/13/2015 1544   K 3.7 09/13/2015 1544   CL 97 09/13/2015 1544   CO2 30 09/13/2015 1544   GLUCOSE 189* 09/13/2015 1544   BUN 14 09/13/2015 1544   CREATININE 0.78 09/13/2015 1544   CALCIUM 9.3 09/13/2015 1544    Coagulation: No results found for: INR, PROTIME No results found for: PTT  Lipids: No results found for: CHOL, TRIG, HDL, CHOLHDL, VLDL, LDLCALC, LDLDIRECT  ESR 18 ANA: neg RF: <10  CTA abd/pelvis (09/15/15) 1. 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 Favor periaortic hematoma, possibly inflammatory aortitis, less likely retroperitoneal fibrosis in the absence of displacement/narrowing of contiguous branch vessels and IVC. Recommend vascular surgery consultation. 2. Descending and sigmoid diverticulosis. 3. T11 compression fracture deformity, age indeterminate.  Based on my review of the CTA, this patient has non-specific findings of soft tissue inflammation around the immediate infrarenal segment of the aorta with one tiny and one small PAU (~9 mm).  There is no evidence of frank rupture or obvious IMH.     Medical Decision Making  ROBERTO HLAVATY is a 69 y.o. 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  Also an aortic etiology would not improve with vomitting.    There no a big series in the literature in regards to the nature history of PAU, so there are no definitive recommendations from the literature.  From the thoracic PAU data, there are suggestions that PAU > 2.0 cm  require more frequently intervention.  Her current largest PAU is only 9 mm, so immediate interventions are needed.  IMH/PAU are also frequently related, so I would not be surprised if there was some IMH adjacent to the PAU.    Her labwork also is NOT consistent with aortitis.  As the patient remains asx currently, I don't think an aggressive stance is necessary, especially since the open aortic mortality rate remains 5-10% in the medicare data.    I would repeat the CTA abd/pelvis in 6 months if she continues to be sx, otherwise, I would plan repeating it in 1 year.  I discussed in depth with the patient the nature of atherosclerosis, and emphasized the importance of maximal medical management including strict control of blood pressure, blood glucose, and lipid levels, antiplatelet agents, obtaining regular exercise, and cessation of smoking.    The patient is aware that without maximal medical management the underlying atherosclerotic disease process will progress, limiting the benefit of any interventions. The patient is currently on a statin: Zocor. The patient is currently noton an anti-platele.  I suggested she start: ASA 81 mg PO daily. I also strongly suggested she need to stop smoking.  Thank you for allowing [place] to participate in this patient's care.   Adele Barthel, MD [facility] Office: [phone] Pager: [phone]  09/17/2015, 1:23 PM

## 2015-09-20 NOTE — Addendum Note (Signed)
Addended by: Dorthula Rue L on: 09/20/2015 02:53 PM   Modules accepted: Orders

## 2015-10-13 ENCOUNTER — Ambulatory Visit: Payer: Commercial Managed Care - HMO | Admitting: Primary Care

## 2015-10-15 ENCOUNTER — Ambulatory Visit (INDEPENDENT_AMBULATORY_CARE_PROVIDER_SITE_OTHER): Payer: PPO | Admitting: Primary Care

## 2015-10-15 ENCOUNTER — Encounter: Payer: Self-pay | Admitting: Primary Care

## 2015-10-15 VITALS — BP 132/82 | HR 74 | Temp 97.6°F | Ht 61.0 in | Wt 132.0 lb

## 2015-10-15 DIAGNOSIS — J209 Acute bronchitis, unspecified: Secondary | ICD-10-CM | POA: Diagnosis not present

## 2015-10-15 MED ORDER — LISINOPRIL-HYDROCHLOROTHIAZIDE 10-12.5 MG PO TABS: 1.0000 | ORAL_TABLET | Freq: Every day | ORAL | Status: DC

## 2015-10-15 MED ORDER — METOPROLOL SUCCINATE ER 25 MG PO TB24: 25.0000 mg | ORAL_TABLET | Freq: Every day | ORAL | Status: DC

## 2015-10-15 MED ORDER — LEVOFLOXACIN 250 MG PO TABS: 250.0000 mg | ORAL_TABLET | Freq: Every day | ORAL | Status: DC

## 2015-10-15 MED ORDER — ALBUTEROL SULFATE HFA 108 (90 BASE) MCG/ACT IN AERS: 2.0000 | INHALATION_SPRAY | Freq: Four times a day (QID) | RESPIRATORY_TRACT | Status: DC | PRN

## 2015-10-15 MED ORDER — OMEPRAZOLE 40 MG PO CPDR: 40.0000 mg | DELAYED_RELEASE_CAPSULE | Freq: Every day | ORAL | Status: DC

## 2015-10-15 MED ORDER — SIMVASTATIN 40 MG PO TABS: 40.0000 mg | ORAL_TABLET | Freq: Every evening | ORAL | Status: DC

## 2015-10-15 NOTE — Progress Notes (Signed)
Pre visit review using our clinic review tool, if applicable. No additional management support is needed unless otherwise documented below in the visit note. 

## 2015-10-15 NOTE — Patient Instructions (Signed)
Start levofloxacin antibiotics. Take 1 tablet by mouth daily for 5 days.  You may use the albuterol inhaler every 6 hours as needed for wheezing/shortness of breath.  Continue Mucinex DM and Dayquil as needed.  It was a pleasure to see you today!

## 2015-10-15 NOTE — Progress Notes (Signed)
Subjective:    [name], female    DOB: 1946-08-15, 70 y.o.   MRN: [id number]  HPI  Morgan [name] is a 70 year old female who presents today with a chief complaint of cough. She also reports nasal congestion, chest congestion, chills, nausea, wheezing. Denies vomiting, sore throat, headache, fevers. Her cough is productive with yellow sputum. Her symptoms have been present for the past 6 days. She is a current smoker and has history of bacterial bronchitis and pneumonia. She endorses getting infections twice a year and is typically treated with levaquin. She's taken Mucinex DM and Dayquil with temporary relief. Her cough is worse at night.   Review of Systems  Constitutional: Positive for chills and fatigue. Negative for fever.  HENT: Positive for congestion. Negative for sinus pressure and sore throat.   Respiratory: Positive for cough and wheezing.   Gastrointestinal: Positive for nausea. Negative for vomiting.        Past Medical History  Diagnosis Date  . COPD (chronic obstructive pulmonary disease) ([place])   . Hypertension   . Hyperlipidemia   . GERD (gastroesophageal reflux disease)   . Seasonal allergies     Social History   Social History  . Marital Status: Married    Spouse Name: N/A  . Number of Children: N/A  . Years of Education: N/A   Occupational History  . Not on file.   Social History Main Topics  . Smoking status: Current Every Day Smoker -- 1.00 packs/day    Types: Cigarettes  . Smokeless tobacco: Not on file  . Alcohol Use: 0.0 oz/week    0 Standard drinks or equivalent per week     Comment: 2 to 3 beer a day  . Drug Use: No  . Sexual Activity: Not on file   Other Topics Concern  . Not on file   Social History Narrative   Married.   2 children, 3 grandchildren.   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   Enjoys spending time with her family, going to the beach and movies.     Past Surgical History  Procedure Laterality  Date  . Back surgery      Family History  Problem Relation Age of Onset  . Heart disease Father     before age 40    Allergies  Allergen Reactions  . Azithromycin Nausea And Vomiting  . Codeine Hives    Makes me crazy    Current Outpatient Prescriptions on File Prior to Visit  Medication Sig Dispense Refill  . ALPRAZolam (XANAX) 1 MG tablet Take 1/2 tablet in the morning and 1/2 in the evening    . Cholecalciferol (D-3-5) 5000 UNITS capsule Take 5,000 Units by mouth daily.    Marland Kitchen FLUZONE HIGH-DOSE 0.5 ML SUSY     . Multiple Vitamin (MULTIVITAMIN) capsule Take 1 capsule by mouth daily.    . diphenhydrAMINE (BENADRYL) 25 mg capsule Take 25 mg by mouth every 6 (six) hours as needed. Reported on 10/15/2015     No current facility-administered medications on file prior to visit.    BP 132/82 mmHg  Pulse 74  Temp(Src) 97.6 F (36.4 C) (Oral)  Ht 5\' 1"  (1.549 m)  Wt 132 lb (59.875 kg)  BMI 24.95 kg/m2  SpO2 97%    Objective:   Physical Exam  Constitutional: She appears well-nourished.  HENT:  Right Ear: Tympanic membrane and ear canal normal.  Left Ear: Tympanic membrane and ear canal normal.  Nose: Nose normal. Right sinus exhibits no maxillary sinus tenderness and no frontal sinus tenderness. Left sinus exhibits no maxillary sinus tenderness and no frontal sinus tenderness.  Mouth/Throat: Oropharynx is clear and moist.  Eyes: Conjunctivae are normal.  Neck: Neck supple.  Cardiovascular: Normal rate and regular rhythm.   Pulmonary/Chest: She has no wheezes. She has rhonchi in the right upper field, the right lower field, the left upper field and the left lower field. She has rales.  Lymphadenopathy:    She has no cervical adenopathy.  Skin: Skin is warm and dry.          Assessment & Plan:  Acute Bronchitis:  Cough x 6 days, now worse. Productive with yellow sputum. Suspect viral initially, however lungs with rhonchi throughout. No wheezing. Otherwise exam  unremarkable. Productive cough during exam today. Lungs suspicious for bacterial involvement. Will send low dose levaquin. Allergy to zpak. Fluids, Mucinex, rest. Return precautions provided.

## 2015-10-28 ENCOUNTER — Other Ambulatory Visit: Payer: Self-pay | Admitting: Primary Care

## 2015-10-28 ENCOUNTER — Ambulatory Visit (INDEPENDENT_AMBULATORY_CARE_PROVIDER_SITE_OTHER)
Admission: RE | Admit: 2015-10-28 | Discharge: 2015-10-28 | Disposition: A | Discharge status: Home / Self Care | Payer: PPO | Source: Ambulatory Visit | Attending: Primary Care | Admitting: Primary Care

## 2015-10-28 ENCOUNTER — Ambulatory Visit (INDEPENDENT_AMBULATORY_CARE_PROVIDER_SITE_OTHER): Payer: PPO | Admitting: Primary Care

## 2015-10-28 ENCOUNTER — Encounter: Payer: Self-pay | Admitting: Primary Care

## 2015-10-28 VITALS — BP 152/88 | HR 73 | Temp 97.8°F | Ht 61.0 in | Wt 133.8 lb

## 2015-10-28 DIAGNOSIS — M25552 Pain in left hip: Secondary | ICD-10-CM

## 2015-10-28 DIAGNOSIS — M545 Low back pain, unspecified: Secondary | ICD-10-CM

## 2015-10-28 DIAGNOSIS — M549 Dorsalgia, unspecified: Secondary | ICD-10-CM | POA: Insufficient documentation

## 2015-10-28 DIAGNOSIS — M47816 Spondylosis without myelopathy or radiculopathy, lumbar region: Secondary | ICD-10-CM | POA: Diagnosis not present

## 2015-10-28 DIAGNOSIS — M1611 Unilateral primary osteoarthritis, right hip: Secondary | ICD-10-CM | POA: Diagnosis not present

## 2015-10-28 MED ORDER — METHOCARBAMOL 500 MG PO TABS: 250.0000 mg | ORAL_TABLET | Freq: Three times a day (TID) | ORAL | Status: DC | PRN

## 2015-10-28 NOTE — Patient Instructions (Signed)
You may try taking methocarbamol muscle relaxer to help with pain. Take 1/2 to 1 full tablet every 8 hours as needed. Caution as this medication may make you drowsy.  Complete xray(s) prior to leaving today. I will notify you of your results once received.  It was a pleasure to see you today!

## 2015-10-28 NOTE — Progress Notes (Signed)
Pre visit review using our clinic review tool, if applicable. No additional management support is needed unless otherwise documented below in the visit note. 

## 2015-10-28 NOTE — Progress Notes (Signed)
Subjective:    [name], female    DOB: 28-Dec-1945, 70 y.o.   MRN: [id number]  HPI  Morgan [name] is a 70 year old female who presents today with a chief complaint of back pain. Her pain is located to the left lower back with radiation to left hip. Her pain began Friday last week. She will typically take Naproxen and a hot shower with improvement. Yesterday her pain became worse so she's taken advil instead without improvement. Denies radiculopathy, recent injury/trauma. Her pain is worse with sitting still for a period of time. She has a history of chronic back pain with 3 prior surgeries. She has also undergone cortisone injections in the past with mild improvement. Her last xray was unremarkable in 2009.   Review of Systems  Respiratory: Negative for shortness of breath.   Cardiovascular: Negative for chest pain.  Musculoskeletal: Positive for back pain and arthralgias. Negative for joint swelling.  Neurological: Negative for numbness.       Past Medical History  Diagnosis Date  . COPD (chronic obstructive pulmonary disease) ([place])   . Hypertension   . Hyperlipidemia   . GERD (gastroesophageal reflux disease)   . Seasonal allergies     Social History   Social History  . Marital Status: Married    Spouse Name: N/A  . Number of Children: N/A  . Years of Education: N/A   Occupational History  . Not on file.   Social History Main Topics  . Smoking status: Current Every Day Smoker -- 1.00 packs/day    Types: E-cigarettes  . Smokeless tobacco: Not on file  . Alcohol Use: 0.0 oz/week    0 Standard drinks or equivalent per week     Comment: 2 to 3 beer a day  . Drug Use: No  . Sexual Activity: Not on file   Other Topics Concern  . Not on file   Social History Narrative   Married.   2 children, 3 grandchildren.   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   Enjoys spending time with her family, going to the beach and movies.     Past Surgical  History  Procedure Laterality Date  . Back surgery      Family History  Problem Relation Age of Onset  . Heart disease Father     before age 31    Allergies  Allergen Reactions  . Azithromycin Nausea And Vomiting  . Codeine Hives    Makes me crazy    Current Outpatient Prescriptions on File Prior to Visit  Medication Sig Dispense Refill  . albuterol (PROVENTIL HFA;VENTOLIN HFA) 108 (90 Base) MCG/ACT inhaler Inhale 2 puffs into the lungs every 6 (six) hours as needed for wheezing or shortness of breath. 1 Inhaler 2  . ALPRAZolam (XANAX) 1 MG tablet Take 1/2 tablet in the morning and 1/2 in the evening    . Cholecalciferol (D-3-5) 5000 UNITS capsule Take 5,000 Units by mouth daily.    Marland Kitchen lisinopril-hydrochlorothiazide (PRINZIDE,ZESTORETIC) 10-12.5 MG tablet Take 1 tablet by mouth daily. 30 tablet 1  . metoprolol succinate (TOPROL-XL) 25 MG 24 hr tablet Take 1 tablet (25 mg total) by mouth daily. 30 tablet 1  . Multiple Vitamin (MULTIVITAMIN) capsule Take 1 capsule by mouth daily.    . naproxen sodium (ANAPROX) 220 MG tablet Take 220 mg by mouth as needed.    Marland Kitchen omeprazole (PRILOSEC) 40 MG capsule Take 1 capsule (40 mg total) by mouth daily. 30 capsule  1  . simvastatin (ZOCOR) 40 MG tablet Take 1 tablet (40 mg total) by mouth every evening. 30 tablet 1   No current facility-administered medications on file prior to visit.    BP 152/88 mmHg  Pulse 73  Temp(Src) 97.8 F (36.6 C) (Oral)  Ht 5\' 1"  (1.549 m)  Wt 133 lb 12.8 oz (60.691 kg)  BMI 25.29 kg/m2  SpO2 99%    Objective:   Physical Exam  Constitutional: She appears well-nourished.  Cardiovascular: Normal rate and regular rhythm.   Pulmonary/Chest: Effort normal and breath sounds normal.  Musculoskeletal:       Left hip: She exhibits decreased range of motion. She exhibits normal strength and no tenderness.       Lumbar back: She exhibits decreased range of motion, tenderness and pain.  Decrease PROM to left  hip. Pain to lower back with straight leg raise in supine position.  Skin: Skin is warm and dry.          Assessment & Plan:

## 2015-10-28 NOTE — Assessment & Plan Note (Signed)
Chronic, history of 3 prior sugeries. Today with acute exacerbation. Last xrays in 2009. Pain also to left hip. Will obtain lumbar and left hip images. Rx for methocarbamol provided for possible muscle involvement. Will consider send to neurosurgery for evaluation given history.

## 2015-11-01 ENCOUNTER — Other Ambulatory Visit: Payer: Self-pay | Admitting: Primary Care

## 2015-11-01 DIAGNOSIS — M5442 Lumbago with sciatica, left side: Secondary | ICD-10-CM

## 2015-11-01 DIAGNOSIS — M5441 Lumbago with sciatica, right side: Secondary | ICD-10-CM

## 2015-11-08 ENCOUNTER — Telehealth: Payer: Self-pay | Admitting: Primary Care

## 2015-11-08 DIAGNOSIS — M5441 Lumbago with sciatica, right side: Secondary | ICD-10-CM

## 2015-11-08 DIAGNOSIS — M5442 Lumbago with sciatica, left side: Secondary | ICD-10-CM

## 2015-11-08 NOTE — Telephone Encounter (Signed)
Noted. MRI placed.

## 2015-11-08 NOTE — Telephone Encounter (Signed)
Patient called to let you know that she saw Dr Claudean Kinds Orthopedic Surgeon in [place], [place] who did her previous back surgeries last Wednesday. He doesn't accept her insurance and he told her she should have her PCP order an MRI and then send it to [facility] to the Attn of Dr Kristeen Miss. They have brought all her records from past Surgeries over to [facility] and faxed over the office note from last Wednesday's appt with Dr Claudean Kinds. She is asking if you will please order an MRI to help get her an appt with Dr Ellene Route. Please place MRI order.

## 2015-11-10 ENCOUNTER — Ambulatory Visit
Admission: RE | Admit: 2015-11-10 | Discharge: 2015-11-10 | Disposition: A | Discharge status: Home / Self Care | Payer: PPO | Source: Ambulatory Visit | Attending: Primary Care | Admitting: Primary Care

## 2015-11-10 DIAGNOSIS — M5442 Lumbago with sciatica, left side: Secondary | ICD-10-CM

## 2015-11-10 DIAGNOSIS — M5441 Lumbago with sciatica, right side: Secondary | ICD-10-CM

## 2015-11-10 DIAGNOSIS — M4806 Spinal stenosis, lumbar region: Secondary | ICD-10-CM | POA: Diagnosis not present

## 2015-11-15 ENCOUNTER — Encounter: Payer: Self-pay | Admitting: *Deleted

## 2015-12-02 ENCOUNTER — Telehealth: Payer: Self-pay

## 2015-12-02 NOTE — Telephone Encounter (Signed)
Pt wants to change her mail order pharmacy; pt has changed to [employer] ins. Pt will cb with name of mail order pharmacy and names of meds needing refill.

## 2015-12-06 ENCOUNTER — Other Ambulatory Visit: Payer: Self-pay | Admitting: Primary Care

## 2015-12-06 DIAGNOSIS — K219 Gastro-esophageal reflux disease without esophagitis: Secondary | ICD-10-CM

## 2015-12-06 DIAGNOSIS — I10 Essential (primary) hypertension: Secondary | ICD-10-CM

## 2015-12-06 DIAGNOSIS — E785 Hyperlipidemia, unspecified: Secondary | ICD-10-CM

## 2015-12-06 NOTE — Telephone Encounter (Signed)
Morgan [name], Patient called today and left a voicemail stating she is ready to use the mail order pharmacy.  She would like for you to call her back so you can get the phone and fax info to add into her chart as her new pharmacy. Please call patient [phone]

## 2015-12-07 MED ORDER — LISINOPRIL-HYDROCHLOROTHIAZIDE 10-12.5 MG PO TABS: 1.0000 | ORAL_TABLET | Freq: Every day | ORAL | Status: DC

## 2015-12-07 MED ORDER — METOPROLOL SUCCINATE ER 25 MG PO TB24: 25.0000 mg | ORAL_TABLET | Freq: Every day | ORAL | Status: DC

## 2015-12-07 MED ORDER — OMEPRAZOLE 40 MG PO CPDR: 40.0000 mg | DELAYED_RELEASE_CAPSULE | Freq: Every day | ORAL | Status: DC

## 2015-12-07 MED ORDER — SIMVASTATIN 40 MG PO TABS: 40.0000 mg | ORAL_TABLET | Freq: Every evening | ORAL | Status: DC

## 2015-12-07 NOTE — Telephone Encounter (Signed)
Called and spoken to patient. I have added the [facility] mail order pharmacy in her list. Patient also request to refill her medications. Ok to refill? Last seen on 10/28/2015. No future appointment.

## 2015-12-09 DIAGNOSIS — M810 Age-related osteoporosis without current pathological fracture: Secondary | ICD-10-CM | POA: Diagnosis not present

## 2015-12-09 DIAGNOSIS — M4120 Other idiopathic scoliosis, site unspecified: Secondary | ICD-10-CM | POA: Diagnosis not present

## 2015-12-09 DIAGNOSIS — M419 Scoliosis, unspecified: Secondary | ICD-10-CM | POA: Insufficient documentation

## 2015-12-09 DIAGNOSIS — M5416 Radiculopathy, lumbar region: Secondary | ICD-10-CM | POA: Diagnosis not present

## 2015-12-09 DIAGNOSIS — M545 Low back pain: Secondary | ICD-10-CM | POA: Diagnosis not present

## 2015-12-10 ENCOUNTER — Other Ambulatory Visit (HOSPITAL_COMMUNITY): Payer: Self-pay | Admitting: Neurological Surgery

## 2015-12-10 ENCOUNTER — Other Ambulatory Visit: Payer: Self-pay | Admitting: Neurological Surgery

## 2015-12-10 DIAGNOSIS — M4120 Other idiopathic scoliosis, site unspecified: Secondary | ICD-10-CM

## 2015-12-10 DIAGNOSIS — R5381 Other malaise: Secondary | ICD-10-CM

## 2015-12-20 ENCOUNTER — Other Ambulatory Visit: Payer: Self-pay | Admitting: Neurological Surgery

## 2015-12-20 ENCOUNTER — Other Ambulatory Visit: Payer: Self-pay

## 2015-12-20 DIAGNOSIS — M810 Age-related osteoporosis without current pathological fracture: Secondary | ICD-10-CM

## 2015-12-21 ENCOUNTER — Ambulatory Visit
Admission: RE | Admit: 2015-12-21 | Discharge: 2015-12-21 | Disposition: A | Discharge status: Home / Self Care | Payer: PPO | Source: Ambulatory Visit | Attending: Neurological Surgery | Admitting: Neurological Surgery

## 2015-12-21 DIAGNOSIS — M810 Age-related osteoporosis without current pathological fracture: Secondary | ICD-10-CM | POA: Diagnosis not present

## 2015-12-23 ENCOUNTER — Ambulatory Visit (HOSPITAL_COMMUNITY)
Admission: RE | Admit: 2015-12-23 | Discharge: 2015-12-23 | Disposition: A | Discharge status: Home / Self Care | Payer: PPO | Source: Ambulatory Visit | Attending: Neurological Surgery | Admitting: Neurological Surgery

## 2015-12-23 ENCOUNTER — Other Ambulatory Visit: Payer: PPO

## 2015-12-23 DIAGNOSIS — M4804 Spinal stenosis, thoracic region: Secondary | ICD-10-CM | POA: Insufficient documentation

## 2015-12-23 DIAGNOSIS — Z981 Arthrodesis status: Secondary | ICD-10-CM | POA: Diagnosis not present

## 2015-12-23 DIAGNOSIS — I700 Atherosclerosis of aorta: Secondary | ICD-10-CM | POA: Insufficient documentation

## 2015-12-23 DIAGNOSIS — M5144 Schmorl's nodes, thoracic region: Secondary | ICD-10-CM | POA: Insufficient documentation

## 2015-12-23 DIAGNOSIS — M5124 Other intervertebral disc displacement, thoracic region: Secondary | ICD-10-CM | POA: Diagnosis not present

## 2015-12-23 DIAGNOSIS — M4802 Spinal stenosis, cervical region: Secondary | ICD-10-CM | POA: Insufficient documentation

## 2015-12-23 DIAGNOSIS — M4716 Other spondylosis with myelopathy, lumbar region: Secondary | ICD-10-CM | POA: Diagnosis not present

## 2015-12-23 DIAGNOSIS — I77811 Abdominal aortic ectasia: Secondary | ICD-10-CM | POA: Insufficient documentation

## 2015-12-23 DIAGNOSIS — M2578 Osteophyte, vertebrae: Secondary | ICD-10-CM | POA: Diagnosis not present

## 2015-12-23 DIAGNOSIS — M4120 Other idiopathic scoliosis, site unspecified: Secondary | ICD-10-CM

## 2015-12-23 DIAGNOSIS — M419 Scoliosis, unspecified: Secondary | ICD-10-CM | POA: Diagnosis not present

## 2015-12-23 DIAGNOSIS — M4806 Spinal stenosis, lumbar region: Secondary | ICD-10-CM | POA: Insufficient documentation

## 2015-12-23 DIAGNOSIS — M50223 Other cervical disc displacement at C6-C7 level: Secondary | ICD-10-CM | POA: Diagnosis not present

## 2015-12-23 MED ORDER — DIAZEPAM 5 MG PO TABS
10.0000 mg | ORAL_TABLET | Freq: Once | ORAL | Status: AC
  Administered 2015-12-23: 10 mg via ORAL
  Filled 2015-12-23: qty 2

## 2015-12-23 MED ORDER — HYDROCODONE-ACETAMINOPHEN 5-325 MG PO TABS
ORAL_TABLET | ORAL | Status: AC
Start: 2015-12-23 — End: 2015-12-23
  Filled 2015-12-23: qty 2

## 2015-12-23 MED ORDER — DEXAMETHASONE 4 MG PO TABS
4.0000 mg | ORAL_TABLET | Freq: Once | ORAL | Status: DC
  Filled 2015-12-23: qty 1

## 2015-12-23 MED ORDER — IOHEXOL 300 MG/ML  SOLN
10.0000 mL | Freq: Once | INTRAMUSCULAR | Status: AC | PRN
  Administered 2015-12-23: 10 mL via INTRATHECAL

## 2015-12-23 MED ORDER — DIAZEPAM 5 MG PO TABS
ORAL_TABLET | ORAL | Status: AC
  Filled 2015-12-23: qty 2

## 2015-12-23 MED ORDER — ONDANSETRON HCL 4 MG/2ML IJ SOLN: 4.0000 mg | Freq: Four times a day (QID) | INTRAMUSCULAR | Status: DC | PRN

## 2015-12-23 MED ORDER — HYDROCODONE-ACETAMINOPHEN 5-325 MG PO TABS
1.0000 | ORAL_TABLET | ORAL | Status: DC | PRN
  Administered 2015-12-23: 2 via ORAL

## 2015-12-23 MED ORDER — LIDOCAINE HCL (PF) 1 % IJ SOLN
INTRAMUSCULAR | Status: AC
  Administered 2015-12-23: 5 mL via INTRAMUSCULAR
  Filled 2015-12-23: qty 5

## 2015-12-23 NOTE — Discharge Instructions (Signed)
Myelography, Care After °These instructions give you information on caring for yourself after your procedure. Your doctor may also give you more specific instructions. Call your doctor if you have any problems or questions after your procedure. °HOME CARE °· Rest the first day. °· When you rest, lie flat, with your head slightly raised (elevated). °· Avoid heavy lifting and activity for 48 hours, or as told by your doctor. °· You may take the bandage (dressing) off one day after the test, or as told by your doctor. °· Take all medicines only as told by your doctor. °· Ask your doctor when it is okay to take a shower or bath. °· Ask your doctor when your test results will be ready and how you can get them. Make sure you follow up and get your results. °· Do not drink alcohol for 24 hours, or as told by your doctor. °· Drink enough fluid to keep your pee (urine) clear or pale yellow. °GET HELP IF:  °· You have a fever. °· You have a headache. °· You feel sick to your stomach (nauseous) or throw up (vomit). °· You have pain or cramping in your belly (abdomen). °GET HELP RIGHT AWAY IF:  °· You have a headache with a stiff neck or fever. °· You have trouble breathing. °· Any of the places where the needles were put in are: °¨ Puffy (swollen) or red. °¨ Sore or hot to the touch. °¨ Draining yellowish-white fluid (pus). °¨ Bleeding. °MAKE SURE YOU: °· Understand these instructions. °· Will watch your condition. °· Will get help right away if you are not doing well or get worse. °  °This information is not intended to replace advice given to you by your health care provider. Make sure you discuss any questions you have with your health care provider. °  °Document Released: 07/04/2008 Document Revised: 10/16/2014 Document Reviewed: 06/19/2012 °Elsevier Interactive Patient Education ©2016 [employer] ° °

## 2015-12-23 NOTE — Procedures (Signed)
Patient is a 70 year old individual who's had significant problems with low back pain chronically she has a degenerative scoliosis that has been present for a number of years she underwent a fusion from L4 to the sacrum but now has additional degenerative changes above this with additional scoliosis measuring 60 Cobb angle at the thoracic lumbar junction she's been feeling progressive weakness in her lower extremities and MRI demonstrates that she has substantial metallic artifact from previous hardware and because of concerns of myelopathic changes throughout the thoracic spine is been advised she undergo a total myelogram.  Pre op Dx: Spondylosis with myelopathy, scoliosis, status post decompression fusion L4 to sacrum Post op Dx: Spondylosis with myelopathy, scoliosis, status post decompression and fusion L4 to sacrum Procedure: Total myelogram Surgeon: Artrice Kraker Puncture level: L3-4 Fluid color: Clear, colorless Injection: Iohexol 300, 10 mL Findings: Moderate areas of stenosis and upper lumbar spine. Severe thoracic kyphosis, poor visualization of the cervical spine will be evaluated with CAT scan in

## 2015-12-29 DIAGNOSIS — M810 Age-related osteoporosis without current pathological fracture: Secondary | ICD-10-CM | POA: Diagnosis not present

## 2015-12-29 DIAGNOSIS — M4120 Other idiopathic scoliosis, site unspecified: Secondary | ICD-10-CM | POA: Diagnosis not present

## 2015-12-31 DIAGNOSIS — M810 Age-related osteoporosis without current pathological fracture: Secondary | ICD-10-CM | POA: Diagnosis not present

## 2015-12-31 DIAGNOSIS — E559 Vitamin D deficiency, unspecified: Secondary | ICD-10-CM | POA: Diagnosis not present

## 2015-12-31 DIAGNOSIS — R5383 Other fatigue: Secondary | ICD-10-CM | POA: Diagnosis not present

## 2016-01-05 DIAGNOSIS — M810 Age-related osteoporosis without current pathological fracture: Secondary | ICD-10-CM | POA: Diagnosis not present

## 2016-02-02 DIAGNOSIS — M810 Age-related osteoporosis without current pathological fracture: Secondary | ICD-10-CM | POA: Diagnosis not present

## 2016-02-16 DIAGNOSIS — M810 Age-related osteoporosis without current pathological fracture: Secondary | ICD-10-CM | POA: Diagnosis not present

## 2016-02-25 DIAGNOSIS — M810 Age-related osteoporosis without current pathological fracture: Secondary | ICD-10-CM | POA: Diagnosis not present

## 2016-02-29 ENCOUNTER — Ambulatory Visit (INDEPENDENT_AMBULATORY_CARE_PROVIDER_SITE_OTHER): Payer: PPO | Admitting: Primary Care

## 2016-02-29 VITALS — BP 146/82 | HR 71 | Temp 97.5°F | Ht 60.0 in | Wt 125.0 lb

## 2016-02-29 DIAGNOSIS — F411 Generalized anxiety disorder: Secondary | ICD-10-CM

## 2016-02-29 DIAGNOSIS — K219 Gastro-esophageal reflux disease without esophagitis: Secondary | ICD-10-CM

## 2016-02-29 MED ORDER — ALPRAZOLAM 0.5 MG PO TABS: ORAL_TABLET | ORAL | Status: DC

## 2016-02-29 MED ORDER — VENLAFAXINE HCL ER 37.5 MG PO CP24: 37.5000 mg | ORAL_CAPSULE | Freq: Every day | ORAL | Status: DC

## 2016-02-29 MED ORDER — RANITIDINE HCL 150 MG PO TABS: 150.0000 mg | ORAL_TABLET | Freq: Two times a day (BID) | ORAL | Status: DC

## 2016-02-29 NOTE — Patient Instructions (Signed)
Start Effexor XR 37.5 mg (Venlafaxine) capsules everyday for anxiety. Take 1 capsule by mouth every morning with breakfast.  We are slowly weaning you off of your Xanax. Take 1/2 tablet by mouth twice daily as needed for 1 week, then 1/2 tablet once daily as needed for 1 week, then 1/2 tablet by mouth every other day as needed for 2 weeks, then stop.  It takes about 4-6 weeks for the Effexor to take full effect.   Start Zantac (ranitidine) 150 mg twice daily for acid reflux. Do not take omeprazole. Please notify me if you develop acid reflux symptoms after you've stopped the omeprazole.  Follow up in 4-6 weeks for re-evaluation of anxiety and acid reflux.  It was a pleasure to see you today!

## 2016-02-29 NOTE — Progress Notes (Signed)
Subjective:    [name], female    DOB: 1946/04/21, 70 y.o.   MRN: [id number]  HPI  Morgan [name] is a 70 year old female who presents today with a chief complaint of anxiety. She is currently managed on 0.5 mg of Alprazolam twice daily from her prior PCP. She's been experiencing an increased amount of anxiety as she is currently experiencing chronic back and and is following with neurosurgery and orthopedics. She needs surgery but her bones are too brittle. She underwent her first Prolia injection last week.   She was once managed on Prozac, Paxil, and Cymbalta numerous years ago. She stopped taking these medication as it caused weight gain. She is open to weaning off her Xanax and would like to start something more stable and less addicting. GAD 7 score of 15 today.   Review of Systems  Respiratory: Negative for shortness of breath.   Cardiovascular: Negative for chest pain.  Psychiatric/Behavioral: Negative for suicidal ideas and sleep disturbance. The patient is nervous/anxious.        Past Medical History  Diagnosis Date  . COPD (chronic obstructive pulmonary disease) ([place])   . Hypertension   . Hyperlipidemia   . GERD (gastroesophageal reflux disease)   . Seasonal allergies      Social History   Social History  . Marital Status: Married    Spouse Name: N/A  . Number of Children: N/A  . Years of Education: N/A   Occupational History  . Not on file.   Social History Main Topics  . Smoking status: Current Every Day Smoker -- 1.00 packs/day    Types: E-cigarettes  . Smokeless tobacco: Not on file  . Alcohol Use: 0.0 oz/week    0 Standard drinks or equivalent per week     Comment: 2 to 3 beer a day  . Drug Use: No  . Sexual Activity: Not on file   Other Topics Concern  . Not on file   Social History Narrative   Married.   2 children, 3 grandchildren.   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   Enjoys spending time with her family, going to  the beach and movies.     Past Surgical History  Procedure Laterality Date  . Back surgery      Family History  Problem Relation Age of Onset  . Heart disease Father     before age 59    Allergies  Allergen Reactions  . Azithromycin Nausea And Vomiting  . Codeine Itching    Makes me crazy    Current Outpatient Prescriptions on File Prior to Visit  Medication Sig Dispense Refill  . Cholecalciferol (D-3-5) 5000 UNITS capsule Take 5,000 Units by mouth daily.    Marland Kitchen ibuprofen (ADVIL,MOTRIN) 200 MG tablet Take 400-600 mg by mouth every 8 (eight) hours as needed for mild pain or moderate pain.    Marland Kitchen lisinopril-hydrochlorothiazide (PRINZIDE,ZESTORETIC) 10-12.5 MG tablet Take 1 tablet by mouth daily. 90 tablet 1  . metoprolol succinate (TOPROL-XL) 25 MG 24 hr tablet Take 1 tablet (25 mg total) by mouth daily. 90 tablet 1  . Multiple Vitamins-Minerals (MULTIVITAMIN GUMMIES ADULTS) CHEW Chew 2 each by mouth daily.    Marland Kitchen omeprazole (PRILOSEC) 40 MG capsule Take 1 capsule (40 mg total) by mouth daily. 90 capsule 1  . simvastatin (ZOCOR) 40 MG tablet Take 1 tablet (40 mg total) by mouth every evening. 90 tablet 1  . albuterol (PROVENTIL HFA;VENTOLIN HFA) 108 (90 Base)  MCG/ACT inhaler Inhale 2 puffs into the lungs every 6 (six) hours as needed for wheezing or shortness of breath. (Patient not taking: Reported on 02/29/2016) 1 Inhaler 2   No current facility-administered medications on file prior to visit.    BP 146/82 mmHg  Pulse 71  Temp(Src) 97.5 F (36.4 C) (Oral)  Ht 5' (1.524 m)  Wt 125 lb (56.7 kg)  BMI 24.41 kg/m2  SpO2 97%    Objective:   Physical Exam  Constitutional: She appears well-nourished.  Cardiovascular: Normal rate and regular rhythm.   Pulmonary/Chest: Effort normal and breath sounds normal.  Skin: Skin is warm and dry.  Psychiatric: She has a normal mood and affect.          Assessment & Plan:

## 2016-02-29 NOTE — Assessment & Plan Note (Signed)
Increased anxiety as she is dealing with chronic back pain and undergoing evaluation. Will slowly wean off Xanax and start low dose Effexor today. Will also provide her with hydroxyzine to use HS for sleep and anxiety.  We discussed possible side effects of headache, GI upset, drowsiness, and SI/HI. If thoughts of SI/HI develop, we discussed to present to the emergency immediately. Patient verbalized understanding.   Follow up in 4-6 weeks.

## 2016-02-29 NOTE — Progress Notes (Signed)
Pre visit review using our clinic review tool, if applicable. No additional management support is needed unless otherwise documented below in the visit note. 

## 2016-03-28 ENCOUNTER — Ambulatory Visit (INDEPENDENT_AMBULATORY_CARE_PROVIDER_SITE_OTHER): Payer: PPO | Admitting: Primary Care

## 2016-03-28 ENCOUNTER — Encounter: Payer: Self-pay | Admitting: Primary Care

## 2016-03-28 VITALS — BP 132/80 | HR 77 | Temp 98.2°F | Ht 60.0 in | Wt 121.4 lb

## 2016-03-28 DIAGNOSIS — F411 Generalized anxiety disorder: Secondary | ICD-10-CM | POA: Diagnosis not present

## 2016-03-28 DIAGNOSIS — K219 Gastro-esophageal reflux disease without esophagitis: Secondary | ICD-10-CM

## 2016-03-28 MED ORDER — HYDROXYZINE HCL 25 MG PO TABS: 25.0000 mg | ORAL_TABLET | Freq: Two times a day (BID) | ORAL | Status: DC | PRN

## 2016-03-28 MED ORDER — VENLAFAXINE HCL ER 75 MG PO CP24: 75.0000 mg | ORAL_CAPSULE | Freq: Every day | ORAL | Status: DC

## 2016-03-28 MED ORDER — RANITIDINE HCL 150 MG PO TABS: 150.0000 mg | ORAL_TABLET | Freq: Two times a day (BID) | ORAL | Status: DC

## 2016-03-28 NOTE — Progress Notes (Signed)
Pre visit review using our clinic review tool, if applicable. No additional management support is needed unless otherwise documented below in the visit note. 

## 2016-03-28 NOTE — Patient Instructions (Signed)
We've increased your dose of Effexor from 37.5 mg to 75 mg. I sent a new prescription of this to your pharmacy. Take 1 tablet by mouth every morning with breakfast.  Start hydroxyzine 25 mg tablets. Take 1 tablet by mouth twice daily as needed for anxiety.  Continue Zantac 150 mg twice daily for acid reflux. Continue to reduce consumption of trigger foods. Take a look at the information listed below.  Food Choices for Gastroesophageal Reflux Disease, Adult When you have gastroesophageal reflux disease (GERD), the foods you eat and your eating habits are very important. Choosing the right foods can help ease the discomfort of GERD. WHAT GENERAL GUIDELINES DO I NEED TO FOLLOW?  Choose fruits, vegetables, whole grains, low-fat dairy products, and low-fat meat, fish, and poultry.  Limit fats such as oils, salad dressings, butter, nuts, and avocado.  Keep a food diary to identify foods that cause symptoms.  Avoid foods that cause reflux. These may be different for different people.  Eat frequent small meals instead of three large meals each day.  Eat your meals slowly, in a relaxed setting.  Limit fried foods.  Cook foods using methods other than frying.  Avoid drinking alcohol.  Avoid drinking large amounts of liquids with your meals.  Avoid bending over or lying down until 2-3 hours after eating. WHAT FOODS ARE NOT RECOMMENDED? The following are some foods and drinks that may worsen your symptoms: Vegetables Tomatoes. Tomato juice. Tomato and spaghetti sauce. Chili peppers. Onion and garlic. Horseradish. Fruits Oranges, grapefruit, and lemon (fruit and juice). Meats High-fat meats, fish, and poultry. This includes hot dogs, ribs, ham, sausage, salami, and bacon. Dairy Whole milk and chocolate milk. Sour cream. Cream. Butter. Ice cream. Cream cheese.  Beverages Coffee and tea, with or without caffeine. Carbonated beverages or energy drinks. Condiments Hot sauce. Barbecue  sauce.  Sweets/Desserts Chocolate and cocoa. Donuts. Peppermint and spearmint. Fats and Oils High-fat foods, including [place] fries and potato chips. Other Vinegar. Strong spices, such as black pepper, white pepper, red pepper, cayenne, curry powder, cloves, ginger, and chili powder. The items listed above may not be a complete list of foods and beverages to avoid. Contact your dietitian for more information.   This information is not intended to replace advice given to you by your health care provider. Make sure you discuss any questions you have with your health care provider.   Document Released: 09/25/2005 Document Revised: 10/16/2014 Document Reviewed: 07/30/2013 Elsevier Interactive Patient Education [employer].

## 2016-03-28 NOTE — Assessment & Plan Note (Signed)
Stable on Zantac 150 milligrams twice daily. No longer taking omeprazole.

## 2016-03-28 NOTE — Progress Notes (Signed)
Subjective:    [name], female    DOB: 10/15/1945, 70 y.o.   MRN: [id number]  HPI  Morgan [name] is a 70 year old female who presents today for follow up.  1) Generalized Anxiety Disorder: Currently managed on Effexor XR 37.5 mg. Last visit we weaned her off of her Alprazolam for breakthrough anxiety as she was only managed on this medication in the past. She has tried Prozac, Paxil, and Cymbalta in the past but stopped taking due to weight gain.  Since her last visit she weaned herself down on the Alprazolam but had a few rough days after completion. After she weaned off my dose she then started taking an older prescription of Alprazolam due to increased family stress. She's noticed an improvement in her mood and anxiety since initiation of Effexor, but doesn't feel as though it's enough. She does wish to stop taking alprazolam as she understands the addictive nature of this medication.  2) GERD: Weaned off Omeprazole 40 mg last visit and transitioned over to Zantac 150 mg BID. Since her last visit she originally had difficulty with her acid reflux, but over time had improvement and is taking Zantac 150 mg twice daily. She is working to reduce consumption of trigger foods for acid reflux. Denies abdominal pain, esophageal burning, vomiting.  Review of Systems  Respiratory: Negative for shortness of breath.   Cardiovascular: Negative for chest pain.  Gastrointestinal: Negative for abdominal pain.  Neurological: Negative for headaches.  Psychiatric/Behavioral: Negative for suicidal ideas and sleep disturbance. The patient is nervous/anxious.        Past Medical History  Diagnosis Date  . COPD (chronic obstructive pulmonary disease) ([place])   . Hypertension   . Hyperlipidemia   . GERD (gastroesophageal reflux disease)   . Seasonal allergies      Social History   Social History  . Marital Status: Married    Spouse Name: N/A  . Number of Children: N/A  . Years of  Education: N/A   Occupational History  . Not on file.   Social History Main Topics  . Smoking status: Current Every Day Smoker -- 1.00 packs/day    Types: E-cigarettes  . Smokeless tobacco: Not on file  . Alcohol Use: 0.0 oz/week    0 Standard drinks or equivalent per week     Comment: 2 to 3 beer a day  . Drug Use: No  . Sexual Activity: Not on file   Other Topics Concern  . Not on file   Social History Narrative   Married.   2 children, 3 grandchildren.   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   Enjoys spending time with her family, going to the beach and movies.     Past Surgical History  Procedure Laterality Date  . Back surgery      Family History  Problem Relation Age of Onset  . Heart disease Father     before age 33    Allergies  Allergen Reactions  . Azithromycin Nausea And Vomiting  . Codeine Itching    Makes me crazy    Current Outpatient Prescriptions on File Prior to Visit  Medication Sig Dispense Refill  . Cholecalciferol (D-3-5) 5000 UNITS capsule Take 5,000 Units by mouth daily.    Marland Kitchen ibuprofen (ADVIL,MOTRIN) 200 MG tablet Take 400-600 mg by mouth every 8 (eight) hours as needed for mild pain or moderate pain.    Marland Kitchen lisinopril-hydrochlorothiazide (PRINZIDE,ZESTORETIC) 10-12.5 MG tablet Take 1 tablet  by mouth daily. 90 tablet 1  . metoprolol succinate (TOPROL-XL) 25 MG 24 hr tablet Take 1 tablet (25 mg total) by mouth daily. 90 tablet 1  . Multiple Vitamins-Minerals (MULTIVITAMIN GUMMIES ADULTS) CHEW Chew 2 each by mouth daily.    . simvastatin (ZOCOR) 40 MG tablet Take 1 tablet (40 mg total) by mouth every evening. 90 tablet 1  . vitamin C (ASCORBIC ACID) 500 MG tablet Take 500 mg by mouth daily.     No current facility-administered medications on file prior to visit.    BP 132/80 mmHg  Pulse 77  Temp(Src) 98.2 F (36.8 C) (Oral)  Ht 5' (1.524 m)  Wt 121 lb 6.4 oz (55.067 kg)  BMI 23.71 kg/m2  SpO2 98%    Objective:   Physical  Exam  Constitutional: She appears well-nourished.  Cardiovascular: Normal rate and regular rhythm.   Pulmonary/Chest: Effort normal and breath sounds normal.  Skin: Skin is warm and dry.  Psychiatric: She has a normal mood and affect.          Assessment & Plan:

## 2016-03-28 NOTE — Assessment & Plan Note (Signed)
Overall improvement on Effexor, however, dose increase if needed. Last visit it was intended for her to have a prescription of hydroxyzine to use for breakthrough anxiety but this was not sent to her pharmacy by accident.  Increased Effexor to 75 mg once daily, prescription for hydroxyzine provided for her to use as needed for breakthrough anxiety.  Will follow-up with patient in 4 weeks to determine if increased dose of Effexor and hydroxyzine are effective.

## 2016-04-18 ENCOUNTER — Telehealth: Payer: Self-pay | Admitting: Primary Care

## 2016-04-18 NOTE — Telephone Encounter (Signed)
-----   Message from [name], NP sent at 03/28/2016  3:11 PM EDT ----- Regarding: Effexor and Hydrozyxine Please check on patient. How's she feeling since we increased her dose of Effexor and added Hydroxyzine?

## 2016-04-18 NOTE — Telephone Encounter (Signed)
Message left for patient to return my call.  

## 2016-04-20 NOTE — Telephone Encounter (Signed)
Noted and agree with Miralax.

## 2016-04-20 NOTE — Telephone Encounter (Signed)
Spoken to patient and she stated that she is doing okay. She feels the medications help her more. Patient did mention that she feels better when on Xanax. Patient informed that she noticed she has been more constipated. She tried stool softners but it didn't help. She had to take some laxatives more often. So she asked if she should try Miralax. Notified patient that she could try and see any improvement.

## 2016-06-28 ENCOUNTER — Encounter: Payer: Self-pay | Admitting: Primary Care

## 2016-06-28 ENCOUNTER — Ambulatory Visit (INDEPENDENT_AMBULATORY_CARE_PROVIDER_SITE_OTHER): Payer: PPO | Admitting: Primary Care

## 2016-06-28 VITALS — BP 132/82 | HR 67 | Temp 97.8°F | Ht 61.0 in | Wt 133.8 lb

## 2016-06-28 DIAGNOSIS — Z23 Encounter for immunization: Secondary | ICD-10-CM | POA: Diagnosis not present

## 2016-06-28 DIAGNOSIS — E785 Hyperlipidemia, unspecified: Secondary | ICD-10-CM

## 2016-06-28 DIAGNOSIS — K5900 Constipation, unspecified: Secondary | ICD-10-CM

## 2016-06-28 DIAGNOSIS — I10 Essential (primary) hypertension: Secondary | ICD-10-CM

## 2016-06-28 DIAGNOSIS — F411 Generalized anxiety disorder: Secondary | ICD-10-CM | POA: Diagnosis not present

## 2016-06-28 DIAGNOSIS — K219 Gastro-esophageal reflux disease without esophagitis: Secondary | ICD-10-CM

## 2016-06-28 LAB — COMPREHENSIVE METABOLIC PANEL
ALT: 14 U/L (ref 0–35)
AST: 20 U/L (ref 0–37)
Albumin: 4.1 g/dL (ref 3.5–5.2)
Alkaline Phosphatase: 47 U/L (ref 39–117)
BUN: 16 mg/dL (ref 6–23)
CO2: 32 mEq/L (ref 19–32)
Calcium: 9 mg/dL (ref 8.4–10.5)
Chloride: 103 mEq/L (ref 96–112)
Creatinine, Ser: 0.72 mg/dL (ref 0.40–1.20)
GFR: 85.43 mL/min (ref >60.00)
Glucose, Bld: 92 mg/dL (ref 70–99)
Potassium: 4.1 mEq/L (ref 3.5–5.1)
Sodium: 140 mEq/L (ref 135–145)
Total Bilirubin: 0.3 mg/dL (ref 0.2–1.2)
Total Protein: 6.7 g/dL (ref 6.0–8.3)

## 2016-06-28 LAB — LIPID PANEL
Cholesterol: 166 mg/dL (ref 0–200)
HDL: 73.2 mg/dL (ref >39.00)
LDL Cholesterol: 70 mg/dL (ref 0–99)
NonHDL: 93.01
Total CHOL/HDL Ratio: 2
Triglycerides: 117 mg/dL (ref 0.0–149.0)
VLDL: 23.4 mg/dL (ref 0.0–40.0)

## 2016-06-28 MED ORDER — LISINOPRIL-HYDROCHLOROTHIAZIDE 10-12.5 MG PO TABS: 1.0000 | ORAL_TABLET | Freq: Every day | ORAL | 3 refills | Status: DC

## 2016-06-28 MED ORDER — RANITIDINE HCL 150 MG PO TABS: 150.0000 mg | ORAL_TABLET | Freq: Every day | ORAL | 3 refills | Status: DC

## 2016-06-28 MED ORDER — METOPROLOL SUCCINATE ER 25 MG PO TB24: 25.0000 mg | ORAL_TABLET | Freq: Every day | ORAL | 3 refills | Status: DC

## 2016-06-28 MED ORDER — OMEPRAZOLE 20 MG PO CPDR: 20.0000 mg | DELAYED_RELEASE_CAPSULE | Freq: Every day | ORAL | 0 refills | Status: DC

## 2016-06-28 MED ORDER — SIMVASTATIN 40 MG PO TABS: 40.0000 mg | ORAL_TABLET | Freq: Every evening | ORAL | 3 refills | Status: DC

## 2016-06-28 NOTE — Progress Notes (Signed)
Pre visit review using our clinic review tool, if applicable. No additional management support is needed unless otherwise documented below in the visit note. 

## 2016-06-28 NOTE — Assessment & Plan Note (Signed)
Stable in clinic, continue Toprol XL and Lisinopril-HCTZ. BMP up to date. Refills provided.

## 2016-06-28 NOTE — Assessment & Plan Note (Signed)
Persistent symptoms of esophageal reflux since off omeprazole 40 mg. Taking Zantac TID and Tums QID without much relief. Will have her restart omeprazole 20 mg once daily. If no complete improvement then will add in Zantac HS. Will continue to monitor.

## 2016-06-28 NOTE — Progress Notes (Signed)
Subjective:    [name], female    DOB: 12/12/1945, 70 y.o.   MRN: [id number]  HPI  Morgan [name] is a 69 year old female who presents today for medication refill and a chief complaint of constipation.  1) Essential Hypertension: Currently managed on lisinopril-HCTZ 10/12.5 mg and Toprol XL 25. Her BP in the clinic is stable in the office today.  2) GERD: Currently managed on Zantac 150 mg twice daily but is taking this three times daily. She takes 4 Tums tablets daily. She was removed from her omeprazole given recent diagnosis of osteoporosis. Since then she's had continues esophageal burning with epigastric discomfort.   3) Hyperlipidemia: Currently managed on Simvastatin 40 mg. She has no lipid panel on file. Denies myalgias.  4) GAD: Previously managed on Xanax only. Several visits ago she was placed on Effexor with improvement. She stopped taking Effexor 1 month ago due to constipation. She feels as though her anxiety has reduced since coming off of the Effexor and doesn't feel as though she needs medication. Her constipation did not improve after she stopped taking Effexor.   5) Constipation: Present since Summer 2017, two weeks after she started taking Effexor. She stopped taking Effexor over 1 month ago and continues to experience constipation. She has bowel movements only if she takes a laxative. She is taking a laxative every 2 days now. She's not tried taking Miralax as she cannot stand the taste. She's never tried probiotics, she is not drinking much water daily, and is not exercising. Denies rectal bleeding, abdominal pain, vomiting.  Review of Systems  Constitutional: Negative for fever.  Respiratory: Negative for shortness of breath.   Cardiovascular: Negative for chest pain.  Gastrointestinal: Positive for constipation. Negative for nausea and vomiting.       Esophageal reflux  Musculoskeletal: Negative for myalgias.  Psychiatric/Behavioral: Negative for  sleep disturbance and suicidal ideas. The patient is not nervous/anxious.        Past Medical History:  Diagnosis Date  . COPD (chronic obstructive pulmonary disease) ([place])   . GERD (gastroesophageal reflux disease)   . Hyperlipidemia   . Hypertension   . Seasonal allergies      Social History   Social History  . Marital status: Married    Spouse name: N/A  . Number of children: N/A  . Years of education: N/A   Occupational History  . Not on file.   Social History Main Topics  . Smoking status: Current Every Day Smoker    Packs/day: 1.00    Types: E-cigarettes  . Smokeless tobacco: Not on file  . Alcohol use 0.0 oz/week     Comment: 2 to 3 beer a day  . Drug use: No  . Sexual activity: Not on file   Other Topics Concern  . Not on file   Social History Narrative   Married.   2 children, 3 grandchildren.   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   Enjoys spending time with her family, going to the beach and movies.     Past Surgical History:  Procedure Laterality Date  . BACK SURGERY      Family History  Problem Relation Age of Onset  . Heart disease Father     before age 1    Allergies  Allergen Reactions  . Azithromycin Nausea And Vomiting  . Codeine Itching    Makes me crazy    Current Outpatient Prescriptions on File Prior to Visit  Medication Sig Dispense Refill  . Cholecalciferol (D-3-5) 5000 UNITS capsule Take 5,000 Units by mouth daily.    Marland Kitchen ibuprofen (ADVIL,MOTRIN) 200 MG tablet Take 400-600 mg by mouth every 8 (eight) hours as needed for mild pain or moderate pain.    . Multiple Vitamins-Minerals (MULTIVITAMIN GUMMIES ADULTS) CHEW Chew 2 each by mouth daily.    . vitamin C (ASCORBIC ACID) 500 MG tablet Take 500 mg by mouth daily.    . hydrOXYzine (ATARAX/VISTARIL) 25 MG tablet Take 1 tablet (25 mg total) by mouth 2 (two) times daily as needed for anxiety. (Patient not taking: Reported on 06/28/2016) 60 tablet 2   No current  facility-administered medications on file prior to visit.     BP 132/82   Pulse 67   Temp 97.8 F (36.6 C) (Oral)   Ht 5\' 1"  (1.549 m)   Wt 133 lb 12.8 oz (60.7 kg)   SpO2 (!) 67%   BMI 25.28 kg/m    Objective:   Physical Exam  Constitutional: She appears well-nourished.  Neck: Neck supple.  Cardiovascular: Normal rate and regular rhythm.   Pulmonary/Chest: Effort normal and breath sounds normal.  Abdominal: Soft. Bowel sounds are normal. There is no tenderness.  Skin: Skin is warm and dry.  Psychiatric: She has a normal mood and affect.          Assessment & Plan:

## 2016-06-28 NOTE — Patient Instructions (Addendum)
Start Omeprazole 20 mg once every morning. Take Zantac 150 mg every evening.  I sent refills of your medications through mail order.  Complete lab work prior to leaving today. I will notify you of your results once received.   Increase consumption of fiber. Take a look at the information below. Increase consumption of water, ensure you are consuming 64 ounces of water daily.   Start exercising. You should be getting 150 minutes of moderate intensity exercise weekly.  Stop taking Laxatives. Please call me if no improvement in your constipation in 3-4 weeks.  It was a pleasure to see you today!  High-Fiber Diet Fiber, also called dietary fiber, is a type of carbohydrate found in fruits, vegetables, whole grains, and beans. A high-fiber diet can have many health benefits. Your health care provider may recommend a high-fiber diet to help:  Prevent constipation. Fiber can make your bowel movements more regular.  Lower your cholesterol.  Relieve hemorrhoids, uncomplicated diverticulosis, or irritable bowel syndrome.  Prevent overeating as part of a weight-loss plan.  Prevent heart disease, type 2 diabetes, and certain cancers. WHAT IS MY PLAN? The recommended daily intake of fiber includes:  38 grams for men under age 56.  19 grams for men over age 34.  78 grams for women under age 10.  66 grams for women over age 41. You can get the recommended daily intake of dietary fiber by eating a variety of fruits, vegetables, grains, and beans. Your health care provider may also recommend a fiber supplement if it is not possible to get enough fiber through your diet. WHAT DO I NEED TO KNOW ABOUT A HIGH-FIBER DIET?  Fiber supplements have not been widely studied for their effectiveness, so it is better to get fiber through food sources.  Always check the fiber content on thenutrition facts label of any prepackaged food. Look for foods that contain at least 5 grams of fiber per  serving.  Ask your dietitian if you have questions about specific foods that are related to your condition, especially if those foods are not listed in the following section.  Increase your daily fiber consumption gradually. Increasing your intake of dietary fiber too quickly may cause bloating, cramping, or gas.  Drink plenty of water. Water helps you to digest fiber. WHAT FOODS CAN I EAT? Grains Whole-grain breads. Multigrain cereal. Oats and oatmeal. Brown rice. Barley. Bulgur wheat. Madeira. Bran muffins. Popcorn. Rye wafer crackers. Vegetables Sweet potatoes. Spinach. Kale. Artichokes. Cabbage. Broccoli. Green peas. Carrots. Squash. Fruits Berries. Pears. Apples. Oranges. Avocados. Prunes and raisins. Dried figs. Meats and Other Protein Sources [place], kidney, pinto, and soy beans. Split peas. Lentils. Nuts and seeds. Dairy Fiber-fortified yogurt. Beverages Fiber-fortified soy milk. Fiber-fortified orange juice. Other Fiber bars. The items listed above may not be a complete list of recommended foods or beverages. Contact your dietitian for more options. WHAT FOODS ARE NOT RECOMMENDED? Grains White bread. Pasta made with refined flour. White rice. Vegetables Fried potatoes. Canned vegetables. Well-cooked vegetables.  Fruits Fruit juice. Cooked, strained fruit. Meats and Other Protein Sources Fatty cuts of meat. Fried Sales executive or fried fish. Dairy Milk. Yogurt. Cream cheese. Sour cream. Beverages Soft drinks. Other Cakes and pastries. Butter and oils. The items listed above may not be a complete list of foods and beverages to avoid. Contact your dietitian for more information. WHAT ARE SOME TIPS FOR INCLUDING HIGH-FIBER FOODS IN MY DIET?  Eat a wide variety of high-fiber foods.  Make sure that half of all  grains consumed each day are whole grains.  Replace breads and cereals made from refined flour or white flour with whole-grain breads and cereals.  Replace white rice  with brown rice, bulgur wheat, or millet.  Start the day with a breakfast that is high in fiber, such as a cereal that contains at least 5 grams of fiber per serving.  Use beans in place of meat in soups, salads, or pasta.  Eat high-fiber snacks, such as berries, raw vegetables, nuts, or popcorn.   This information is not intended to replace advice given to you by your health care provider. Make sure you discuss any questions you have with your health care provider.   Document Released: 09/25/2005 Document Revised: 10/16/2014 Document Reviewed: 03/10/2014 Elsevier Interactive Patient Education [employer].

## 2016-06-28 NOTE — Assessment & Plan Note (Signed)
Improved since off Effexor. Now just uses hydroxyzine PRN. Denise SI/HI.

## 2016-06-28 NOTE — Assessment & Plan Note (Signed)
Present since summer 2017. Now taking laxatives 2-3 times weekly, strongly discouraged use. After further discussion she is not eating much fiber, does not drink water, and is not exercising. Information provided today regarding fiber rich foods, appropriate water intake, and recommendations for daily exercise. Will also have her start daily probiotics. Good bowel sounds throughout. No suspicion for obstruction.

## 2016-06-28 NOTE — Assessment & Plan Note (Signed)
Lipid panel and LFT's pending today. Refill simvastatin.

## 2016-06-29 ENCOUNTER — Encounter: Payer: Self-pay | Admitting: *Deleted

## 2016-07-03 ENCOUNTER — Telehealth: Payer: Self-pay | Admitting: *Deleted

## 2016-07-03 NOTE — Telephone Encounter (Signed)
Morgan [name] from [facility] called stating that she sent over a request for a prior authorization on Hydroxyzine and wanted to make sure that you got it? Please let Morgan [name] know the status of the PA.

## 2016-07-04 NOTE — Telephone Encounter (Signed)
Received fax request for Prior Auth and will process it.  Notified Anna at [facility].

## 2016-07-04 NOTE — Telephone Encounter (Signed)
Morgan [name], any information regarding this?

## 2016-07-06 NOTE — Telephone Encounter (Signed)
Faxed over Prior Auth on 07/05/2016.

## 2016-08-21 DIAGNOSIS — Z1231 Encounter for screening mammogram for malignant neoplasm of breast: Secondary | ICD-10-CM | POA: Diagnosis not present

## 2016-08-21 DIAGNOSIS — M810 Age-related osteoporosis without current pathological fracture: Secondary | ICD-10-CM | POA: Diagnosis not present

## 2016-08-21 DIAGNOSIS — E559 Vitamin D deficiency, unspecified: Secondary | ICD-10-CM | POA: Diagnosis not present

## 2016-08-21 DIAGNOSIS — R5383 Other fatigue: Secondary | ICD-10-CM | POA: Diagnosis not present

## 2016-08-23 ENCOUNTER — Encounter: Payer: Self-pay | Admitting: Primary Care

## 2016-08-26 ENCOUNTER — Other Ambulatory Visit: Payer: Self-pay | Admitting: Primary Care

## 2016-08-26 DIAGNOSIS — F411 Generalized anxiety disorder: Secondary | ICD-10-CM

## 2016-08-30 DIAGNOSIS — M810 Age-related osteoporosis without current pathological fracture: Secondary | ICD-10-CM | POA: Diagnosis not present

## 2016-09-08 ENCOUNTER — Ambulatory Visit
Admission: RE | Admit: 2016-09-08 | Discharge: 2016-09-08 | Disposition: A | Discharge status: Home / Self Care | Payer: PPO | Source: Ambulatory Visit | Attending: Vascular Surgery | Admitting: Vascular Surgery

## 2016-09-08 DIAGNOSIS — I700 Atherosclerosis of aorta: Secondary | ICD-10-CM

## 2016-09-08 MED ORDER — IOPAMIDOL (ISOVUE-370) INJECTION 76%
75.0000 mL | Freq: Once | INTRAVENOUS | Status: AC | PRN
  Administered 2016-09-08: 75 mL via INTRAVENOUS

## 2016-09-13 ENCOUNTER — Encounter: Payer: Self-pay | Admitting: Vascular Surgery

## 2016-09-13 DIAGNOSIS — M4120 Other idiopathic scoliosis, site unspecified: Secondary | ICD-10-CM | POA: Diagnosis not present

## 2016-09-13 DIAGNOSIS — Z6825 Body mass index (BMI) 25.0-25.9, adult: Secondary | ICD-10-CM | POA: Diagnosis not present

## 2016-09-13 DIAGNOSIS — I10 Essential (primary) hypertension: Secondary | ICD-10-CM | POA: Diagnosis not present

## 2016-09-18 NOTE — Progress Notes (Signed)
Established Abdominal Aortic PAU  History of Present Illness  The patient is a 70 y.o. ([birth year]) female who presents with chief complaint: back pain.  This patient previously presented with severe back/flank pain.  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  The spine ortho felt this should be treated with more conservative measures.  She still has back sx but these are improved.  She has had no recurrently of the prior sx leading to the prior CT.  The patient's PMH, PSH, SH, and FamHx are unchanged from 09/17/15.  Current Outpatient Prescriptions  Medication Sig Dispense Refill  . Cholecalciferol (D-3-5) 5000 UNITS capsule Take 5,000 Units by mouth daily.    . hydrOXYzine (ATARAX/VISTARIL) 25 MG tablet TAKE 1 TABLET (25 MG TOTAL) BY MOUTH 2 (TWO) TIMES DAILY AS NEEDED FOR ANXIETY. 60 tablet 2  . ibuprofen (ADVIL,MOTRIN) 200 MG tablet Take 400-600 mg by mouth every 8 (eight) hours as needed for mild pain or moderate pain.    Marland Kitchen lisinopril-hydrochlorothiazide (PRINZIDE,ZESTORETIC) 10-12.5 MG tablet Take 1 tablet by mouth daily. 90 tablet 3  . metoprolol succinate (TOPROL-XL) 25 MG 24 hr tablet Take 1 tablet (25 mg total) by mouth daily. 90 tablet 3  . Multiple Vitamins-Minerals (MULTIVITAMIN GUMMIES ADULTS) CHEW Chew 2 each by mouth daily.    Marland Kitchen omeprazole (PRILOSEC) 20 MG capsule Take 1 capsule (20 mg total) by mouth daily. 90 capsule 0  . ranitidine (ZANTAC) 150 MG tablet Take 1 tablet (150 mg total) by mouth daily. 90 tablet 3  . simvastatin (ZOCOR) 40 MG tablet Take 1 tablet (40 mg total) by mouth every evening. 90 tablet 3  . vitamin C (ASCORBIC ACID) 500 MG tablet Take 500 mg by mouth daily.     No current facility-administered medications for this visit.     On ROS today: chronic back pain, no motor loss   Physical Examination  Vitals:   09/22/16 1125  BP:  131/73  Pulse: 81  Resp: 14  Temp: 97.2 F (36.2 C)  SpO2: 99%  Weight: 125 lb (56.7 kg)  Height: 5\' 1"  (1.549 m)   Body mass index is 23.62 kg/m.  General: A&O x 3, WDWN  Pulmonary: Sym exp, good air movt, CTAB, no rales, rhonchi, & wheezing  Cardiac: RRR, Nl S1, S2, no Murmurs, rubs or gallops  Vascular: Vessel Right Left  Radial Palpable Palpable  Brachial Palpable Palpable  Carotid Palpable, without bruit Palpable, without bruit  Aorta Not palpable N/A  Femoral Palpable Palpable  Popliteal Not palpable Not palpable  PT Palpable Palpable  DP Palpable Palpable   Gastrointestinal: soft, NTND, no G/R, no HSM, no masses, no CVAT B  Musculoskeletal: M/S 5/5 throughout , Extremities without ischemic changes   Neurologic:  Pain and light touch intact in extremities , Motor exam as listed above   CTA Abd/pelvis (09/22/2016) VASCULAR  1. Moderate amount of mixed calcified and noncalcified atherosclerotic plaque within a normal caliber abdominal aorta, not resulting in a hemodynamically significant stenosis. Aortic Atherosclerosis (ICD10-170.0) 2. Note is made of two penetrating though contained atherosclerotic ulcers arising from the left side of the abdominal aorta as detailed above. No abdominal aortic dissection or periaortic stranding.  NON-VASCULAR  1. Unchanged punctate (approximately 4 mm) right middle lobe pulmonary nodule, stable since the 09/2015 examination. This examination documents 1 year of stability. No follow-up needed if patient is low-risk.  2. Stable sequela  of lower lumbar paraspinal fusion with persistent severe scoliotic curvature of the thoracolumbar spine.  Based on my review of this patient's CTA, there is no evidence of IMH in the segment involving the two small PAU.  The periaortic tissue appears less inflamed than previously.  I don't appreciate that the size of these PAU has changed much.   Medical Decision Making  The patient is  a 70 y.o. female who presents with: small asx aortic PAU x 2   After comparing the two CT studies, I suspect that the patient likely had a sx PAU with limited IMH previously.  Unfortunately, it would have been impossible to separate the spinal related pain and the PAU related pain, so I doubt I would have done anything.  At this point, the PAUs remain small without any IMH or dissection or periaortic stranding, and the patient is complete asx from the aorta, so I don't think there is an indication for intervention.  In the event of development of sx or worsening size growth in these PAU, I would consider EVAR for these PAU.  At this point, I think it is acceptable to monitor her aorta with annual CTA abd/pelvis as I doubt AAA duplex will be adequate.  Thank you for allowing [place] to participate in this patient's care.   Adele Barthel, MD, FACS [facility] Office: [phone] Pager: [phone]

## 2016-09-22 ENCOUNTER — Ambulatory Visit (INDEPENDENT_AMBULATORY_CARE_PROVIDER_SITE_OTHER): Payer: PPO | Admitting: Vascular Surgery

## 2016-09-22 ENCOUNTER — Ambulatory Visit: Payer: Commercial Managed Care - HMO | Admitting: Vascular Surgery

## 2016-09-22 ENCOUNTER — Encounter: Payer: Self-pay | Admitting: Vascular Surgery

## 2016-09-22 VITALS — BP 131/73 | HR 81 | Temp 97.2°F | Resp 14 | Ht 61.0 in | Wt 125.0 lb

## 2016-09-22 DIAGNOSIS — I700 Atherosclerosis of aorta: Secondary | ICD-10-CM | POA: Diagnosis not present

## 2016-09-26 ENCOUNTER — Other Ambulatory Visit: Payer: Self-pay | Admitting: Primary Care

## 2016-09-26 DIAGNOSIS — K219 Gastro-esophageal reflux disease without esophagitis: Secondary | ICD-10-CM

## 2016-09-26 MED ORDER — OMEPRAZOLE 20 MG PO CPDR: 20.0000 mg | DELAYED_RELEASE_CAPSULE | Freq: Every day | ORAL | 0 refills | Status: DC

## 2016-09-26 NOTE — Telephone Encounter (Signed)
Received faxed refill request for omeprazole (PRILOSEC) 20 MG capsule.  Last prescribed and seen on 06/28/2016.  Will refill as requested to mail order.

## 2016-09-27 ENCOUNTER — Ambulatory Visit: Payer: PPO

## 2016-10-11 ENCOUNTER — Ambulatory Visit (INDEPENDENT_AMBULATORY_CARE_PROVIDER_SITE_OTHER): Payer: PPO | Admitting: Primary Care

## 2016-10-11 ENCOUNTER — Encounter: Payer: Self-pay | Admitting: Primary Care

## 2016-10-11 VITALS — BP 134/86 | HR 70 | Temp 97.3°F | Ht 61.0 in | Wt 126.8 lb

## 2016-10-11 DIAGNOSIS — I700 Atherosclerosis of aorta: Secondary | ICD-10-CM | POA: Diagnosis not present

## 2016-10-11 DIAGNOSIS — F411 Generalized anxiety disorder: Secondary | ICD-10-CM

## 2016-10-11 DIAGNOSIS — Z0000 Encounter for general adult medical examination without abnormal findings: Secondary | ICD-10-CM

## 2016-10-11 DIAGNOSIS — Z23 Encounter for immunization: Secondary | ICD-10-CM

## 2016-10-11 DIAGNOSIS — I10 Essential (primary) hypertension: Secondary | ICD-10-CM

## 2016-10-11 MED ORDER — ZOSTER VACCINE LIVE 19400 UNT/0.65ML ~~LOC~~ SUSR: 0.6500 mL | Freq: Once | SUBCUTANEOUS | 0 refills | Status: AC

## 2016-10-11 NOTE — Addendum Note (Signed)
Addended by: Jacqualin Combes on: 10/11/2016 05:29 PM   Modules accepted: Orders

## 2016-10-11 NOTE — Progress Notes (Signed)
[name], female   DOB: 1946-01-15, 71 y.o.   MRN: [id number]  HPI: Ms. Morgan [name] is a 71 year old female who presents today for her annual wellness exam.  Past Medical History:  Diagnosis Date  . COPD (chronic obstructive pulmonary disease) ([place])   . GERD (gastroesophageal reflux disease)   . Hyperlipidemia   . Hypertension   . Seasonal allergies     Current Outpatient Prescriptions  Medication Sig Dispense Refill  . aspirin EC 81 MG tablet Take 81 mg by mouth daily.    . Cholecalciferol (D-3-5) 5000 UNITS capsule Take 5,000 Units by mouth daily.    . hydrOXYzine (ATARAX/VISTARIL) 25 MG tablet TAKE 1 TABLET (25 MG TOTAL) BY MOUTH 2 (TWO) TIMES DAILY AS NEEDED FOR ANXIETY. 60 tablet 2  . ibuprofen (ADVIL,MOTRIN) 200 MG tablet Take 400-600 mg by mouth every 8 (eight) hours as needed for mild pain or moderate pain.    Marland Kitchen lisinopril-hydrochlorothiazide (PRINZIDE,ZESTORETIC) 10-12.5 MG tablet Take 1 tablet by mouth daily. 90 tablet 3  . metoprolol succinate (TOPROL-XL) 25 MG 24 hr tablet Take 1 tablet (25 mg total) by mouth daily. 90 tablet 3  . Multiple Vitamins-Minerals (MULTIVITAMIN GUMMIES ADULTS) CHEW Chew 2 each by mouth daily.    Marland Kitchen omeprazole (PRILOSEC) 20 MG capsule Take 1 capsule (20 mg total) by mouth daily. 90 capsule 0  . ranitidine (ZANTAC) 150 MG tablet Take 1 tablet (150 mg total) by mouth daily. 90 tablet 3  . simvastatin (ZOCOR) 40 MG tablet Take 1 tablet (40 mg total) by mouth every evening. 90 tablet 3  . vitamin C (ASCORBIC ACID) 500 MG tablet Take 500 mg by mouth daily.     No current facility-administered medications for this visit.     Allergies  Allergen Reactions  . Azithromycin Nausea And Vomiting  . Codeine Itching    Makes me crazy    Family History  Problem Relation Age of Onset  . Heart disease Father     before age 44    Social History   Social History  . Marital status: Married    Spouse name: N/A  . Number of children: N/A  .  Years of education: N/A   Occupational History  . Not on file.   Social History Main Topics  . Smoking status: Current Every Day Smoker    Packs/day: 1.00    Types: E-cigarettes  . Smokeless tobacco: Never Used  . Alcohol use 0.0 oz/week     Comment: 2 to 3 beer a day  . Drug use: No  . Sexual activity: Not on file   Other Topics Concern  . Not on file   Social History Narrative   Married.   2 children, 3 grandchildren.   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   Enjoys spending time with her family, going to the beach and movies.     Hospitiliaztions: None  Health Maintenance:    Flu: Completed in September 2017  Tetanus: Unsure, believes it's been over 10 years.  Pneumovax: Never completed  Prevnar: Never completed  Zostavax: Never completed.   Bone Density: Completed in March 2017, osteoporosis, managed on Prolia.  Colonoscopy: Completed a "partial" colonoscopy over 10 years.   Eye Doctor: Completed in 2014, no abrupt changes in vision  Dental Exam: Completed in April 2017.  Mammogram: Completed in November 2017, negative  Pap: Completed in 2012, declines today.    Providers: Dr. Layne Benton, Orthopedics; Dr. Ellene Route, Neurosurgery; Dr. Bridgett Larsson,  Vein and Vascular; Dr. Donnal Debar, audiologist; Alma Friendly, PCP   I have personally reviewed and have noted: 1. The patient's medical and social history 2. Their use of alcohol, tobacco or illicit drugs 3. Their current medications and supplements 4. The patient's functional ability including ADL's, fall risks, home safety risks  and hearing or visual impairment. 5. Diet and physical activities 6. Evidence for depression or mood disorder  Subjective:   Review of Systems:   Constitutional: Denies fever, headache or abrupt weight changes.  HEENT: Denies eye pain, eye redness, ear pain, ringing in the ears, wax buildup, runny nose, nasal congestion, bloody nose, or sore throat. Respiratory: Denies difficulty breathing,  shortness of breath. She has noticed sinus congestion and chest congestion that began on 10/01/16. Her cough is productive with light yellow which is better than before.   Cardiovascular: Denies chest pain, chest tightness, palpitations or swelling in the hands or feet.  Gastrointestinal: Denies abdominal pain, bloating, constipation, diarrhea or blood in the stool.  GU: Denies urgency, frequency, pain with urination, burning sensation, blood in urine, odor or discharge. Musculoskeletal: She does experience decrease in range of motion to lower back. No difficulty with gait, muscle pain or joint pain and swelling.  Skin: Denies redness, rashes, lesions or ulcercations.  Neurological: Denies dizziness, difficulty with memory, difficulty with speech or problems with balance and coordination.   No other specific complaints in a complete review of systems (except as listed in HPI above).  Objective:  PE:   BP 134/86   Pulse 70   Temp 97.3 F (36.3 C) (Oral)   Ht 5\' 1"  (1.549 m)   Wt 126 lb 12.8 oz (57.5 kg)   SpO2 96%   BMI 23.96 kg/m  Wt Readings from Last 3 Encounters:  10/11/16 126 lb 12.8 oz (57.5 kg)  09/22/16 125 lb (56.7 kg)  06/28/16 133 lb 12.8 oz (60.7 kg)    General: Appears their stated age, well developed, well nourished in NAD. Skin: Warm, dry and intact. No rashes, lesions or ulcerations noted. HEENT: Head: normal shape and size; Eyes: sclera white, no icterus, conjunctiva pink, PERRLA and EOMs intact; Ears: Tm's gray and intact, normal light reflex; Nose: mucosa pink and moist, septum midline; Throat/Mouth: Teeth present, mucosa pink and moist, no exudate, lesions or ulcerations noted.  Neck: Normal range of motion. Neck supple, trachea midline. No massses, lumps or thyromegaly present.  Cardiovascular: Normal rate and rhythm. S1,S2 noted.  No murmur, rubs or gallops noted. No JVD or BLE edema. No carotid bruits noted. Pulmonary/Chest: Normal effort and positive vesicular  breath sounds. No respiratory distress. No wheezes, rales or ronchi noted.  Abdomen: Soft and nontender. Normal bowel sounds, no bruits noted. No distention or masses noted. Liver, spleen and kidneys non palpable. Musculoskeletal: Normal range of motion. No signs of joint swelling. No difficulty with gait.  Neurological: Alert and oriented. Cranial nerves II-XII intact. Coordination normal. +DTRs bilaterally. Psychiatric: Mood and affect normal. Behavior is normal. Judgment and thought content normal.    BMET    Component Value Date/Time   NA 140 06/28/2016 1500   K 4.1 06/28/2016 1500   CL 103 06/28/2016 1500   CO2 32 06/28/2016 1500   GLUCOSE 92 06/28/2016 1500   BUN 16 06/28/2016 1500   CREATININE 0.72 06/28/2016 1500   CALCIUM 9.0 06/28/2016 1500    Lipid Panel     Component Value Date/Time   CHOL 166 06/28/2016 1500   TRIG 117.0 06/28/2016 1500  HDL 73.20 06/28/2016 1500   CHOLHDL 2 06/28/2016 1500   VLDL 23.4 06/28/2016 1500   LDLCALC 70 06/28/2016 1500    CBC    Component Value Date/Time   WBC 10.1 09/13/2015 1544   RBC 3.86 (L) 09/13/2015 1544   HGB 12.2 09/13/2015 1544   HCT 36.9 09/13/2015 1544   PLT 482.0 (H) 09/13/2015 1544   MCV 95.7 09/13/2015 1544   MCHC 33.1 09/13/2015 1544   RDW 12.4 09/13/2015 1544   LYMPHSABS 3.3 09/13/2015 1544   MONOABS 0.8 09/13/2015 1544   EOSABS 0.1 09/13/2015 1544   BASOSABS 0.1 09/13/2015 1544    Hgb A1C No results found for: HGBA1C    Assessment and Plan:   Medicare Annual Wellness Visit:  Diet: She endorses a healthy diet. Physical activity: Active but does not regularly exercise. Depression/mood screen: Negative. Does experience bouts of anxiety with improvement with hydroxyzine.  Hearing: Intact to whispered voice Visual acuity: Grossly normal, performs annual eye exam  ADLs: Capable Fall risk: None, discussed prevention. Home safety: Good  Cognitive evaluation: Intact to orientation, naming, recall and  repetition EOL planning: Discussed advanced directives. Full code.  Preventative Medicine: Unsure of Td, she will check with insurance company. Prevnar due and provided today. Rx printed for Zostavax with instructions to check with insurance for coverage and complete 30 days after Prevnar. Influenza vaccination UTD. Overall healthy diet, discussed regular exercise. Labs UTD. Exam stable. 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 Information provided regarding Cologuard today. She declines Pap, strongly recommended. All recommendations were provided at the end of her visit today.  Next appointment: Follow up in 1 year.

## 2016-10-11 NOTE — Assessment & Plan Note (Signed)
Recently completed CT angio per vascular with two ulcers to left side. She will continue with annual CT evaluation. No evidence of dissection.

## 2016-10-11 NOTE — Assessment & Plan Note (Signed)
Unsure of Td, she will check with insurance company. Prevnar due and provided today. Rx printed for Zostavax with instructions to check with insurance for coverage and complete 30 days after Prevnar. Influenza vaccination UTD. Overall healthy diet, discussed regular exercise. Labs UTD. Exam stable. 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 Information provided regarding Cologuard today. She declines Pap, strongly recommended. All recommendations were provided at the end of her visit today.  I have personally reviewed and have noted: 1. The patient's medical and social history 2. Their use of alcohol, tobacco or illicit drugs 3. Their current medications and supplements 4. The patient's functional ability including ADL's, fall  risks, home safety risks and hearing or visual  impairment. 5. Diet and physical activities 6. Evidence for depression or mood disorder

## 2016-10-11 NOTE — Progress Notes (Signed)
Pre visit review using our clinic review tool, if applicable. No additional management support is needed unless otherwise documented below in the visit note. 

## 2016-10-11 NOTE — Patient Instructions (Addendum)
You were provided with a pneumonia vaccination today.  Call your insurance company to ensure they will cover the Zostavax (shingles) vaccination. Take the prescription to your pharmacy for administration. You must wait 30 days after the pneumonia vaccination before you can get the shingles vaccination.  Complete the Cologuard specimen once received.  Schedule an eye exam at your earliest convenience.   I recommend a Pap. Think about this.  Continue to work on healthy diet and regular exercise.  Continue Prolia injections for osteoporosis.  Follow up in 1 year for annual physical or sooner if needed.  It was a pleasure to see you today!

## 2016-10-11 NOTE — Assessment & Plan Note (Signed)
Overall manages well. Uses hydroxyzine PRN for breakthrough anxiety.

## 2016-10-11 NOTE — Assessment & Plan Note (Signed)
Stable. Continue Toprol XL.

## 2016-10-11 NOTE — Assessment & Plan Note (Signed)
Lipids stable in September 2017, continue statin.

## 2016-10-12 ENCOUNTER — Telehealth: Payer: Self-pay | Admitting: Primary Care

## 2016-10-12 NOTE — Telephone Encounter (Signed)
Please notify patient that I did some research, and based off of current guidelines she will need a repeat bone density scan in March of 2019, not this March. Continue Prolia injections.

## 2016-10-12 NOTE — Telephone Encounter (Signed)
Message left for patient to return my call.  

## 2016-10-13 NOTE — Telephone Encounter (Signed)
Spoken and notified patient of Kate's comments. Patient verbalized understanding. 

## 2016-10-16 ENCOUNTER — Telehealth: Payer: Self-pay | Admitting: *Deleted

## 2016-10-16 DIAGNOSIS — J069 Acute upper respiratory infection, unspecified: Secondary | ICD-10-CM

## 2016-10-16 MED ORDER — AMOXICILLIN 875 MG PO TABS: 875.0000 mg | ORAL_TABLET | Freq: Two times a day (BID) | ORAL | 0 refills | Status: DC

## 2016-10-16 NOTE — Telephone Encounter (Signed)
Spoke to pt

## 2016-10-16 NOTE — Telephone Encounter (Signed)
Please notify patient that I've sent in a prescription for Amoxicillin to her pharmacy. Take 1 tablet by mouth twice daily for 7 days.

## 2016-10-16 NOTE — Telephone Encounter (Addendum)
Patient stated that she was seen last week and was told to call back this week if not better and an antibiotic would be called in for her. Patient stated that she has head and chest congestion/light yellow, low grade fever and feels that she has an infection.   Patient stated she wanted to let Anda Kraft know that she is not any better and feels really bad. Pharmacy [facility]

## 2016-10-24 ENCOUNTER — Telehealth: Payer: Self-pay | Admitting: Primary Care

## 2016-10-24 NOTE — Telephone Encounter (Signed)
Spoken to patient earlier today regarding Cologaurd.Will fax order to [employer].  Also patient wanted to asked Anda Kraft. Patient was seen on 10/11/2016. Patient stated that she is not feeling any better. Still have a lot congestion and ears still bothering her.  Patient stated that she completed the amoxicillin a couple days ago. Patient would like to know if she need another round of antibiotics or need to be seen. Please advise.

## 2016-10-24 NOTE — Telephone Encounter (Signed)
Patient spoke to [employer] about [employer].  The insurance company told her it would be approved with a $30 co-pay.

## 2016-10-24 NOTE — Telephone Encounter (Signed)
She shouldn't need another antibiotic. If she's no better at all then she needs to be re-evaluated.  Has she tried Mucinex for chest congestion? Is she running fevers? Is she having difficulty breathing?

## 2016-10-24 NOTE — Telephone Encounter (Signed)
Spoken and notified patient of Kate's comments. Patient verbalized understanding. 

## 2016-10-30 ENCOUNTER — Encounter: Payer: Self-pay | Admitting: Primary Care

## 2016-10-30 ENCOUNTER — Ambulatory Visit (INDEPENDENT_AMBULATORY_CARE_PROVIDER_SITE_OTHER)
Admission: RE | Admit: 2016-10-30 | Discharge: 2016-10-30 | Disposition: A | Discharge status: Home / Self Care | Payer: PPO | Source: Ambulatory Visit | Attending: Primary Care | Admitting: Primary Care

## 2016-10-30 ENCOUNTER — Ambulatory Visit (INDEPENDENT_AMBULATORY_CARE_PROVIDER_SITE_OTHER): Payer: PPO | Admitting: Primary Care

## 2016-10-30 ENCOUNTER — Other Ambulatory Visit: Payer: Self-pay | Admitting: Primary Care

## 2016-10-30 VITALS — BP 152/90 | HR 77 | Temp 98.7°F | Ht 61.0 in | Wt 125.8 lb

## 2016-10-30 DIAGNOSIS — R059 Cough, unspecified: Secondary | ICD-10-CM

## 2016-10-30 DIAGNOSIS — J209 Acute bronchitis, unspecified: Secondary | ICD-10-CM

## 2016-10-30 DIAGNOSIS — R05 Cough: Secondary | ICD-10-CM

## 2016-10-30 MED ORDER — DOXYCYCLINE HYCLATE 100 MG PO TABS: 100.0000 mg | ORAL_TABLET | Freq: Two times a day (BID) | ORAL | 0 refills | Status: DC

## 2016-10-30 NOTE — Progress Notes (Signed)
Subjective:    [name], female    DOB: [birth year], 71 y.o.   MRN: [id number]  HPI  Ms. Morgan [name] is a 71 year old female who presents today with a chief complaint of cough. She also reports nasal congestion, ear pain, ear fullness. She was evaluated on 01/03 for [place] and noted to have URI symptoms. She called in several days later reporting productive cough with yellow sputum and feeling worse and was provided with a prescription for Amoxil. She called in eight days after her antibiotic was prescribed stating no improvement. She was recommended to come in again for re-evaluation.  Since completion of antibiotics she's not noticed any improvement. She's experiencing ear pain, sinus pressure, cough, chest congestion, body aches, fatigue. Her cough is worse in the morning with mucous production. She has run low grade fevers. She was once on Spiriva for emphysema but stopped taking it a while back. She did recently restart the Spiriva for a few days with improvement in mucous production.  Review of Systems  Constitutional: Positive for chills and fatigue.  HENT: Positive for congestion, ear pain and sinus pressure.   Respiratory: Positive for cough. Negative for shortness of breath and wheezing.   Cardiovascular: Negative for chest pain.       Past Medical History:  Diagnosis Date  . COPD (chronic obstructive pulmonary disease) ([place])   . GERD (gastroesophageal reflux disease)   . Hyperlipidemia   . Hypertension   . Seasonal allergies      Social History   Social History  . Marital status: Married    Spouse name: N/A  . Number of children: N/A  . Years of education: N/A   Occupational History  . Not on file.   Social History Main Topics  . Smoking status: Current Every Day Smoker    Packs/day: 1.00    Types: E-cigarettes  . Smokeless tobacco: Never Used  . Alcohol use 0.0 oz/week     Comment: 2 to 3 beer a day  . Drug use: No  . Sexual activity: Not on file     Other Topics Concern  . Not on file   Social History Narrative   Married.   2 children, 3 grandchildren.   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   Enjoys spending time with her family, going to the beach and movies.     Past Surgical History:  Procedure Laterality Date  . BACK SURGERY      Family History  Problem Relation Age of Onset  . Heart disease Father     before age 84    Allergies  Allergen Reactions  . Azithromycin Nausea And Vomiting  . Codeine Itching    Makes me crazy    Current Outpatient Prescriptions on File Prior to Visit  Medication Sig Dispense Refill  . aspirin EC 81 MG tablet Take 81 mg by mouth daily.    . Cholecalciferol (D-3-5) 5000 UNITS capsule Take 5,000 Units by mouth daily.    . hydrOXYzine (ATARAX/VISTARIL) 25 MG tablet TAKE 1 TABLET (25 MG TOTAL) BY MOUTH 2 (TWO) TIMES DAILY AS NEEDED FOR ANXIETY. 60 tablet 2  . ibuprofen (ADVIL,MOTRIN) 200 MG tablet Take 400-600 mg by mouth every 8 (eight) hours as needed for mild pain or moderate pain.    Marland Kitchen lisinopril-hydrochlorothiazide (PRINZIDE,ZESTORETIC) 10-12.5 MG tablet Take 1 tablet by mouth daily. 90 tablet 3  . metoprolol succinate (TOPROL-XL) 25 MG 24 hr tablet Take 1 tablet (25 mg  total) by mouth daily. 90 tablet 3  . Multiple Vitamins-Minerals (MULTIVITAMIN GUMMIES ADULTS) CHEW Chew 2 each by mouth daily.    Marland Kitchen omeprazole (PRILOSEC) 20 MG capsule Take 1 capsule (20 mg total) by mouth daily. 90 capsule 0  . ranitidine (ZANTAC) 150 MG tablet Take 1 tablet (150 mg total) by mouth daily. 90 tablet 3  . simvastatin (ZOCOR) 40 MG tablet Take 1 tablet (40 mg total) by mouth every evening. 90 tablet 3  . vitamin C (ASCORBIC ACID) 500 MG tablet Take 500 mg by mouth daily.     No current facility-administered medications on file prior to visit.     BP (!) 152/90   Pulse 77   Temp 98.7 F (37.1 C) (Oral)   Ht 5\' 1"  (1.549 m)   Wt 125 lb 12.8 oz (57.1 kg)   SpO2 98%   BMI 23.77 kg/m     Objective:   Physical Exam  Constitutional: She appears well-nourished.  HENT:  Right Ear: Ear canal normal.  Left Ear: Ear canal normal.  Nose: Mucosal edema present. Right sinus exhibits maxillary sinus tenderness. Right sinus exhibits no frontal sinus tenderness. Left sinus exhibits maxillary sinus tenderness. Left sinus exhibits no frontal sinus tenderness.  Mouth/Throat: Oropharynx is clear and moist.  TM cloudy bilaterally  Eyes: Conjunctivae are normal.  Neck: Neck supple.  Cardiovascular: Normal rate and regular rhythm.   Pulmonary/Chest: Effort normal and breath sounds normal. She has no wheezes. She has no rales.  Lymphadenopathy:    She has no cervical adenopathy.  Skin: Skin is warm and dry.          Assessment & Plan:  URI vs Bronchitis:  Symptoms since early January. No improvement with Amoxil course. Exam today without evidence of pneumonia, but given duration of symptoms, history of tobacco abuse, and systemic symptoms will obtain chest xray. Vitals stable. She appears tired, not sickly. Will await results of xray. Consider Doxycycline.   Sheral Flow, NP

## 2016-10-30 NOTE — Progress Notes (Signed)
Pre visit review using our clinic review tool, if applicable. No additional management support is needed unless otherwise documented below in the visit note. 

## 2016-10-30 NOTE — Patient Instructions (Signed)
Continue Mucinex for chest congestion.  Nasal Congestion/Ear Pressure: Try using Flonase (fluticasone) nasal spray. Instill 1 spray in each nostril twice daily.   Consider restarting your Spiriva inhaler as this will decrease overall mucous production.  Complete xray(s) prior to leaving today. I will notify you of your results once received.  It was a pleasure to see you today!

## 2016-11-15 ENCOUNTER — Encounter: Payer: Self-pay | Admitting: Primary Care

## 2016-11-15 DIAGNOSIS — Z1212 Encounter for screening for malignant neoplasm of rectum: Secondary | ICD-10-CM | POA: Diagnosis not present

## 2016-11-15 DIAGNOSIS — Z1211 Encounter for screening for malignant neoplasm of colon: Secondary | ICD-10-CM | POA: Diagnosis not present

## 2016-11-16 LAB — COLOGUARD

## 2016-11-23 ENCOUNTER — Telehealth: Payer: Self-pay | Admitting: Primary Care

## 2016-11-23 NOTE — Telephone Encounter (Signed)
Please notify patient that her Cologuard result was negative for colon cancer. We will repeat this in 3 years.

## 2016-11-24 NOTE — Telephone Encounter (Signed)
Sending letter with results and Kate's comments for patient. 

## 2016-12-07 ENCOUNTER — Other Ambulatory Visit: Payer: Self-pay | Admitting: Primary Care

## 2016-12-07 DIAGNOSIS — F411 Generalized anxiety disorder: Secondary | ICD-10-CM

## 2017-02-19 DIAGNOSIS — M810 Age-related osteoporosis without current pathological fracture: Secondary | ICD-10-CM | POA: Diagnosis not present

## 2017-02-19 DIAGNOSIS — E559 Vitamin D deficiency, unspecified: Secondary | ICD-10-CM | POA: Diagnosis not present

## 2017-02-19 DIAGNOSIS — R5383 Other fatigue: Secondary | ICD-10-CM | POA: Diagnosis not present

## 2017-02-26 ENCOUNTER — Other Ambulatory Visit: Payer: Self-pay | Admitting: Primary Care

## 2017-02-26 DIAGNOSIS — K219 Gastro-esophageal reflux disease without esophagitis: Secondary | ICD-10-CM

## 2017-02-26 MED ORDER — OMEPRAZOLE 20 MG PO CPDR: 20.0000 mg | DELAYED_RELEASE_CAPSULE | Freq: Every day | ORAL | 0 refills | Status: DC

## 2017-02-28 DIAGNOSIS — M810 Age-related osteoporosis without current pathological fracture: Secondary | ICD-10-CM | POA: Diagnosis not present

## 2017-02-28 DIAGNOSIS — E559 Vitamin D deficiency, unspecified: Secondary | ICD-10-CM | POA: Diagnosis not present

## 2017-03-13 ENCOUNTER — Other Ambulatory Visit: Payer: Self-pay | Admitting: Primary Care

## 2017-03-13 DIAGNOSIS — F411 Generalized anxiety disorder: Secondary | ICD-10-CM

## 2017-04-26 ENCOUNTER — Encounter: Payer: Self-pay | Admitting: Primary Care

## 2017-04-26 ENCOUNTER — Ambulatory Visit (INDEPENDENT_AMBULATORY_CARE_PROVIDER_SITE_OTHER): Payer: PPO | Admitting: Primary Care

## 2017-04-26 VITALS — BP 126/74 | HR 64 | Temp 98.2°F | Ht 61.0 in | Wt 120.1 lb

## 2017-04-26 DIAGNOSIS — F32A Depression, unspecified: Secondary | ICD-10-CM

## 2017-04-26 DIAGNOSIS — F329 Major depressive disorder, single episode, unspecified: Secondary | ICD-10-CM

## 2017-04-26 DIAGNOSIS — F419 Anxiety disorder, unspecified: Secondary | ICD-10-CM | POA: Diagnosis not present

## 2017-04-26 MED ORDER — VENLAFAXINE HCL ER 37.5 MG PO CP24: 37.5000 mg | ORAL_CAPSULE | Freq: Every day | ORAL | 1 refills | Status: DC

## 2017-04-26 NOTE — Assessment & Plan Note (Signed)
GAD 7 score of 15, PHQ 9 score of 17 today. Since she had some improvement on her short term treatment with Effexor, will restart at XR 37.5 mg once daily. Discussed potential side effects, she verbalized understanding.  Follow up in 6 weeks for re-evaluation.

## 2017-04-26 NOTE — Progress Notes (Signed)
Subjective:    [name], female    DOB: 05/05/1946, 71 y.o.   MRN: [id number]  HPI  Morgan [name] is a 71 year old female with a history of anxiety disorder who presents today with a chief complaint of anxiety and depression.   Previously managed on Xanax solely for anxiety this was discontinued given daily symptoms of anxiety despite medication use. She was initiated on Effexor XR 37.5 mg in Summer 2017 and increased to 75 mg six weeks later. She did notice brief improvement in symptoms but stopped taking the Effexor after 1 month as she felt better.   She's experiencing symptoms of loss of interest in most things, little motivation to do anything, doesn't want to be around people, daily worry, daily anxiety. GAD 7 score of 15 and PHQ 9 score of 17. She denies SI/HI.  Review of Systems  Constitutional: Positive for fatigue.  Cardiovascular: Negative for chest pain and palpitations.  Neurological: Negative for headaches.  Psychiatric/Behavioral: Negative for sleep disturbance. The patient is nervous/anxious.        See HPI       Past Medical History:  Diagnosis Date  . COPD (chronic obstructive pulmonary disease) ([place])   . GERD (gastroesophageal reflux disease)   . Hyperlipidemia   . Hypertension   . Seasonal allergies      Social History   Social History  . Marital status: Married    Spouse name: N/A  . Number of children: N/A  . Years of education: N/A   Occupational History  . Not on file.   Social History Main Topics  . Smoking status: Current Every Day Smoker    Packs/day: 1.00    Types: E-cigarettes  . Smokeless tobacco: Never Used  . Alcohol use 0.0 oz/week     Comment: 2 to 3 beer a day  . Drug use: No  . Sexual activity: Not on file   Other Topics Concern  . Not on file   Social History Narrative   Married.   2 children, 3 grandchildren.   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   Enjoys spending time with her family, going to  the beach and movies.     Past Surgical History:  Procedure Laterality Date  . BACK SURGERY      Family History  Problem Relation Age of Onset  . Heart disease Father        before age 62    Allergies  Allergen Reactions  . Azithromycin Nausea And Vomiting  . Codeine Itching    Makes me crazy    Current Outpatient Prescriptions on File Prior to Visit  Medication Sig Dispense Refill  . aspirin EC 81 MG tablet Take 81 mg by mouth daily.    . Cholecalciferol (D-3-5) 5000 UNITS capsule Take 5,000 Units by mouth daily.    . hydrOXYzine (ATARAX/VISTARIL) 25 MG tablet TAKE 1 TABLET (25 MG TOTAL) BY MOUTH 2 (TWO) TIMES DAILY AS NEEDED FOR ANXIETY. 60 tablet 2  . ibuprofen (ADVIL,MOTRIN) 200 MG tablet Take 400-600 mg by mouth every 8 (eight) hours as needed for mild pain or moderate pain.    Marland Kitchen lisinopril-hydrochlorothiazide (PRINZIDE,ZESTORETIC) 10-12.5 MG tablet Take 1 tablet by mouth daily. 90 tablet 3  . metoprolol succinate (TOPROL-XL) 25 MG 24 hr tablet Take 1 tablet (25 mg total) by mouth daily. 90 tablet 3  . Multiple Vitamins-Minerals (MULTIVITAMIN GUMMIES ADULTS) CHEW Chew 2 each by mouth daily.    Marland Kitchen  omeprazole (PRILOSEC) 20 MG capsule Take 1 capsule (20 mg total) by mouth daily. 90 capsule 0  . simvastatin (ZOCOR) 40 MG tablet Take 1 tablet (40 mg total) by mouth every evening. 90 tablet 3  . vitamin C (ASCORBIC ACID) 500 MG tablet Take 500 mg by mouth daily.    . ranitidine (ZANTAC) 150 MG tablet Take 1 tablet (150 mg total) by mouth daily. (Patient not taking: Reported on 04/26/2017) 90 tablet 3   No current facility-administered medications on file prior to visit.     BP 126/74   Pulse 64   Temp 98.2 F (36.8 C) (Oral)   Ht 5\' 1"  (1.549 m)   Wt 120 lb 1.9 oz (54.5 kg)   SpO2 98%   BMI 22.70 kg/m    Objective:   Physical Exam  Constitutional: She appears well-nourished.  Neck: Neck supple.  Cardiovascular: Normal rate and regular rhythm.   Pulmonary/Chest:  Effort normal and breath sounds normal.  Skin: Skin is warm and dry.  Psychiatric:  Mood seems more down than usual          Assessment & Plan:

## 2017-04-26 NOTE — Patient Instructions (Signed)
Start venlafaxine (Effexor) ER 37.5 mg tablets for anxiety and depression. Take 1 tablet by mouth daily.  Schedule a follow up visit in 6 weeks for re-evaluation. Call me sooner if you have any problems.  It was a pleasure to see you today!

## 2017-05-11 ENCOUNTER — Telehealth: Payer: Self-pay

## 2017-05-11 DIAGNOSIS — F419 Anxiety disorder, unspecified: Principal | ICD-10-CM

## 2017-05-11 DIAGNOSIS — F329 Major depressive disorder, single episode, unspecified: Secondary | ICD-10-CM

## 2017-05-11 DIAGNOSIS — F32A Depression, unspecified: Secondary | ICD-10-CM

## 2017-05-11 MED ORDER — VENLAFAXINE HCL ER 75 MG PO CP24: 75.0000 mg | ORAL_CAPSULE | Freq: Every day | ORAL | 0 refills | Status: DC

## 2017-05-11 NOTE — Telephone Encounter (Signed)
Pt left v/m that pt was seen on 04/26/17 and pt was started on effexor XR 37.5 mg taking one cap po at breakfast. Pt said this dosage is not helping and pt request to be increased to 75 mg. [facility]. Pt request cb.

## 2017-05-11 NOTE — Telephone Encounter (Signed)
Pt was notified of results and instructions, pt verbalized understanding.   

## 2017-05-11 NOTE — Telephone Encounter (Signed)
Will increase dose to XR 75 mg. She may take two of the 37.5 mg capsules until her bottle is empty. I sent a new prescription for the 75 mg capsules to her pharmacy (just take one of those). We will see her in late August for follow up as scheduled.

## 2017-05-30 ENCOUNTER — Other Ambulatory Visit: Payer: Self-pay

## 2017-05-30 DIAGNOSIS — I700 Atherosclerosis of aorta: Secondary | ICD-10-CM

## 2017-05-31 ENCOUNTER — Other Ambulatory Visit: Payer: Self-pay | Admitting: Sports Medicine

## 2017-05-31 DIAGNOSIS — M810 Age-related osteoporosis without current pathological fracture: Secondary | ICD-10-CM | POA: Diagnosis not present

## 2017-05-31 DIAGNOSIS — M25552 Pain in left hip: Secondary | ICD-10-CM | POA: Diagnosis not present

## 2017-05-31 DIAGNOSIS — M549 Dorsalgia, unspecified: Secondary | ICD-10-CM

## 2017-05-31 DIAGNOSIS — M8448XS Pathological fracture, other site, sequela: Secondary | ICD-10-CM

## 2017-05-31 DIAGNOSIS — M545 Low back pain: Secondary | ICD-10-CM | POA: Diagnosis not present

## 2017-06-05 ENCOUNTER — Telehealth: Payer: Self-pay | Admitting: Primary Care

## 2017-06-05 NOTE — Telephone Encounter (Signed)
Please notify patient that she is not due for physical. If she would like to continue the Effexor and would like lab work done and she will need to come in for an office visit. She may schedule this at her convenience. I do recommend lab work and follow-up as she will be due in September.

## 2017-06-05 NOTE — Telephone Encounter (Signed)
Spoken and notified patient of Kate's comments. Patient verbalized understanding.  Patient will call back to schedule follow up appt with lab work.  FYI to Anda Kraft, patient wanted Anda Kraft to know that she is will on the Effexor.

## 2017-06-05 NOTE — Telephone Encounter (Signed)
Noted  

## 2017-06-05 NOTE — Telephone Encounter (Signed)
Patient called to cancel her 6 wk follow up appointment on 06/06/17.  Patient said she wants to have lab work done, but it's not due until 06/29/17.  Patient said she doesn't want to come in for a physical. Please advise.

## 2017-06-06 ENCOUNTER — Other Ambulatory Visit: Payer: PPO

## 2017-06-07 ENCOUNTER — Ambulatory Visit: Payer: PPO | Admitting: Primary Care

## 2017-06-10 ENCOUNTER — Ambulatory Visit
Admission: RE | Admit: 2017-06-10 | Discharge: 2017-06-10 | Disposition: A | Discharge status: Home / Self Care | Payer: PPO | Source: Ambulatory Visit | Attending: Sports Medicine | Admitting: Sports Medicine

## 2017-06-10 DIAGNOSIS — M8448XS Pathological fracture, other site, sequela: Secondary | ICD-10-CM

## 2017-06-10 DIAGNOSIS — M549 Dorsalgia, unspecified: Secondary | ICD-10-CM

## 2017-06-10 DIAGNOSIS — M5126 Other intervertebral disc displacement, lumbar region: Secondary | ICD-10-CM | POA: Diagnosis not present

## 2017-06-13 ENCOUNTER — Other Ambulatory Visit: Payer: Self-pay | Admitting: Primary Care

## 2017-06-13 ENCOUNTER — Other Ambulatory Visit: Payer: PPO

## 2017-06-13 DIAGNOSIS — K219 Gastro-esophageal reflux disease without esophagitis: Secondary | ICD-10-CM

## 2017-06-18 ENCOUNTER — Other Ambulatory Visit: Payer: PPO

## 2017-06-28 DIAGNOSIS — M47816 Spondylosis without myelopathy or radiculopathy, lumbar region: Secondary | ICD-10-CM | POA: Diagnosis not present

## 2017-07-06 DIAGNOSIS — M5416 Radiculopathy, lumbar region: Secondary | ICD-10-CM | POA: Diagnosis not present

## 2017-07-06 DIAGNOSIS — M48061 Spinal stenosis, lumbar region without neurogenic claudication: Secondary | ICD-10-CM | POA: Diagnosis not present

## 2017-07-06 DIAGNOSIS — M5136 Other intervertebral disc degeneration, lumbar region: Secondary | ICD-10-CM | POA: Diagnosis not present

## 2017-07-06 DIAGNOSIS — M4726 Other spondylosis with radiculopathy, lumbar region: Secondary | ICD-10-CM | POA: Diagnosis not present

## 2017-07-31 ENCOUNTER — Encounter: Payer: Self-pay | Admitting: Primary Care

## 2017-07-31 ENCOUNTER — Ambulatory Visit (INDEPENDENT_AMBULATORY_CARE_PROVIDER_SITE_OTHER): Payer: PPO | Admitting: Primary Care

## 2017-07-31 VITALS — BP 144/82 | HR 83 | Temp 98.1°F | Ht 61.0 in | Wt 124.1 lb

## 2017-07-31 DIAGNOSIS — Z23 Encounter for immunization: Secondary | ICD-10-CM

## 2017-07-31 DIAGNOSIS — I10 Essential (primary) hypertension: Secondary | ICD-10-CM | POA: Diagnosis not present

## 2017-07-31 DIAGNOSIS — F329 Major depressive disorder, single episode, unspecified: Secondary | ICD-10-CM | POA: Diagnosis not present

## 2017-07-31 DIAGNOSIS — E785 Hyperlipidemia, unspecified: Secondary | ICD-10-CM

## 2017-07-31 DIAGNOSIS — F419 Anxiety disorder, unspecified: Secondary | ICD-10-CM

## 2017-07-31 DIAGNOSIS — K219 Gastro-esophageal reflux disease without esophagitis: Secondary | ICD-10-CM

## 2017-07-31 DIAGNOSIS — I700 Atherosclerosis of aorta: Secondary | ICD-10-CM

## 2017-07-31 DIAGNOSIS — F32A Depression, unspecified: Secondary | ICD-10-CM

## 2017-07-31 LAB — COMPREHENSIVE METABOLIC PANEL
ALT: 19 U/L (ref 0–35)
AST: 22 U/L (ref 0–37)
Albumin: 4.2 g/dL (ref 3.5–5.2)
Alkaline Phosphatase: 45 U/L (ref 39–117)
BUN: 19 mg/dL (ref 6–23)
CO2: 29 mEq/L (ref 19–32)
Calcium: 8.9 mg/dL (ref 8.4–10.5)
Chloride: 99 mEq/L (ref 96–112)
Creatinine, Ser: 0.69 mg/dL (ref 0.40–1.20)
GFR: 89.44 mL/min (ref >60.00)
Glucose, Bld: 114 mg/dL — ABNORMAL HIGH (ref 70–99)
Potassium: 3.5 mEq/L (ref 3.5–5.1)
Sodium: 136 mEq/L (ref 135–145)
Total Bilirubin: 0.5 mg/dL (ref 0.2–1.2)
Total Protein: 7 g/dL (ref 6.0–8.3)

## 2017-07-31 LAB — LIPID PANEL
Cholesterol: 210 mg/dL — ABNORMAL HIGH (ref 0–200)
HDL: 96 mg/dL (ref >39.00)
LDL Cholesterol: 94 mg/dL (ref 0–99)
NonHDL: 114.06
Total CHOL/HDL Ratio: 2
Triglycerides: 102 mg/dL (ref 0.0–149.0)
VLDL: 20.4 mg/dL (ref 0.0–40.0)

## 2017-07-31 MED ORDER — METOPROLOL SUCCINATE ER 25 MG PO TB24: 25.0000 mg | ORAL_TABLET | Freq: Every day | ORAL | 3 refills | Status: DC

## 2017-07-31 MED ORDER — LISINOPRIL-HYDROCHLOROTHIAZIDE 10-12.5 MG PO TABS: 1.0000 | ORAL_TABLET | Freq: Every day | ORAL | 3 refills | Status: DC

## 2017-07-31 MED ORDER — VENLAFAXINE HCL ER 75 MG PO CP24: 75.0000 mg | ORAL_CAPSULE | Freq: Every day | ORAL | 3 refills | Status: DC

## 2017-07-31 MED ORDER — OMEPRAZOLE 20 MG PO CPDR: 20.0000 mg | DELAYED_RELEASE_CAPSULE | Freq: Every day | ORAL | 3 refills | Status: DC

## 2017-07-31 MED ORDER — SIMVASTATIN 40 MG PO TABS: 40.0000 mg | ORAL_TABLET | Freq: Every evening | ORAL | 3 refills | Status: DC

## 2017-07-31 NOTE — Assessment & Plan Note (Signed)
Following with Neurosurgery, recent epidural was successful. Doing well.

## 2017-07-31 NOTE — Assessment & Plan Note (Signed)
Due for repeat CT scan in January 2019.

## 2017-07-31 NOTE — Assessment & Plan Note (Signed)
Stable overall. Continue lisinopril-HCTZ and metoprolol. Refills sent to pharmacy. BMP pending.

## 2017-07-31 NOTE — Assessment & Plan Note (Signed)
Doing well on increased Effexor dose, continue same. Refills sent to pharmacy.

## 2017-07-31 NOTE — Assessment & Plan Note (Signed)
Stable on omeprazole, use Zantac PRN. Refills sent to pharmacy.

## 2017-07-31 NOTE — Assessment & Plan Note (Signed)
Due for repeat lipids today. Refills sent for Simvastatin.

## 2017-07-31 NOTE — Patient Instructions (Signed)
Complete lab work prior to leaving today. I will notify you of your results once received.   I sent refills of your medications through the mail order pharmacy.  It was a pleasure to see you today!

## 2017-07-31 NOTE — Progress Notes (Signed)
Subjective:    [name], female    DOB: June 30, 1946, 71 y.o.   MRN: [id number]  HPI  Morgan [name] is a 71 year old female who presents today for medication refills.  1) Hyperlipidemia: Currently managed on Simvastatin 40 mg. She is due for repeat lipid panel today. She denies myalgias.   2) Essential Hypertension: Currently managed on lisinopril-HCTZ 10-12.5 mg once daily and metoprolol succinate 25 mg once daily. She does not check her BP at home. She denies chest pain, dizziness.  3) Anxiety and Depression: Currently managed on Effexor 75 mg. She feels well managed on this dose. She denies SI/HI.   4) GERD: Currently managed on omeprazole 20 mg. She feels well managed on this dose. She will occasionally require Zantac in addition to omeprazole. Without her omeprazole she will experience esophageal burning with reflux.   Review of Systems  Eyes: Negative for visual disturbance.  Respiratory: Negative for shortness of breath.   Cardiovascular: Negative for chest pain.  Gastrointestinal:       GERD  Neurological: Negative for dizziness and headaches.  Psychiatric/Behavioral: Negative for sleep disturbance. The patient is not nervous/anxious.        Doing well on Effexor       Past Medical History:  Diagnosis Date  . COPD (chronic obstructive pulmonary disease) ([place])   . GERD (gastroesophageal reflux disease)   . Hyperlipidemia   . Hypertension   . Seasonal allergies      Social History   Social History  . Marital status: Married    Spouse name: N/A  . Number of children: N/A  . Years of education: N/A   Occupational History  . Not on file.   Social History Main Topics  . Smoking status: Current Every Day Smoker    Packs/day: 1.00    Types: E-cigarettes  . Smokeless tobacco: Never Used  . Alcohol use 0.0 oz/week     Comment: 2 to 3 beer a day  . Drug use: No  . Sexual activity: Not on file   Other Topics Concern  . Not on file   Social  History Narrative   Married.   2 children, 3 grandchildren.   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   Enjoys spending time with her family, going to the beach and movies.     Past Surgical History:  Procedure Laterality Date  . BACK SURGERY      Family History  Problem Relation Age of Onset  . Heart disease Father        before age 59    Allergies  Allergen Reactions  . Azithromycin Nausea And Vomiting  . Codeine Itching    Makes me crazy    Current Outpatient Prescriptions on File Prior to Visit  Medication Sig Dispense Refill  . Cholecalciferol (D-3-5) 5000 UNITS capsule Take 5,000 Units by mouth daily.    . Multiple Vitamins-Minerals (MULTIVITAMIN GUMMIES ADULTS) CHEW Chew 2 each by mouth daily.    . vitamin C (ASCORBIC ACID) 500 MG tablet Take 500 mg by mouth daily.    Marland Kitchen aspirin EC 81 MG tablet Take 81 mg by mouth daily.    Marland Kitchen ibuprofen (ADVIL,MOTRIN) 200 MG tablet Take 400-600 mg by mouth every 8 (eight) hours as needed for mild pain or moderate pain.    . ranitidine (ZANTAC) 150 MG tablet TAKE 1 TABLET (150 MG TOTAL) BY MOUTH 2 (TWO) TIMES DAILY. (Patient not taking: Reported on 07/31/2017) 180 tablet  1   No current facility-administered medications on file prior to visit.     BP (!) 144/82   Pulse 83   Temp 98.1 F (36.7 C) (Oral)   Ht 5\' 1"  (1.549 m)   Wt 124 lb 1.9 oz (56.3 kg)   SpO2 98%   BMI 23.45 kg/m    Objective:   Physical Exam  Constitutional: She appears well-nourished.  Neck: Neck supple.  Cardiovascular: Normal rate and regular rhythm.   Pulmonary/Chest: Effort normal and breath sounds normal.  Skin: Skin is warm and dry.          Assessment & Plan:

## 2017-08-01 NOTE — Addendum Note (Signed)
Addended by: Jacqualin Combes on: 08/01/2017 06:44 PM   Modules accepted: Orders

## 2017-08-03 ENCOUNTER — Other Ambulatory Visit: Payer: Self-pay | Admitting: Primary Care

## 2017-08-03 DIAGNOSIS — R739 Hyperglycemia, unspecified: Secondary | ICD-10-CM

## 2017-08-06 ENCOUNTER — Other Ambulatory Visit (INDEPENDENT_AMBULATORY_CARE_PROVIDER_SITE_OTHER): Payer: PPO

## 2017-08-06 DIAGNOSIS — R739 Hyperglycemia, unspecified: Secondary | ICD-10-CM

## 2017-08-06 LAB — HEMOGLOBIN A1C: Hgb A1c MFr Bld: 5.6 % (ref 4.6–6.5)

## 2017-08-07 ENCOUNTER — Other Ambulatory Visit: Payer: Self-pay | Admitting: Primary Care

## 2017-08-07 DIAGNOSIS — F419 Anxiety disorder, unspecified: Principal | ICD-10-CM

## 2017-08-07 DIAGNOSIS — F32A Depression, unspecified: Secondary | ICD-10-CM

## 2017-08-07 DIAGNOSIS — F329 Major depressive disorder, single episode, unspecified: Secondary | ICD-10-CM

## 2017-08-24 DIAGNOSIS — M810 Age-related osteoporosis without current pathological fracture: Secondary | ICD-10-CM | POA: Diagnosis not present

## 2017-08-24 DIAGNOSIS — E559 Vitamin D deficiency, unspecified: Secondary | ICD-10-CM | POA: Diagnosis not present

## 2017-08-24 DIAGNOSIS — R5383 Other fatigue: Secondary | ICD-10-CM | POA: Diagnosis not present

## 2017-09-05 DIAGNOSIS — E559 Vitamin D deficiency, unspecified: Secondary | ICD-10-CM | POA: Diagnosis not present

## 2017-09-05 DIAGNOSIS — M810 Age-related osteoporosis without current pathological fracture: Secondary | ICD-10-CM | POA: Diagnosis not present

## 2017-10-11 NOTE — Progress Notes (Deleted)
Established Aortic PAU   History of Present Illness   Morgan [name] is a 72 y.o. (August 09, 1946) female who presents with chief complaint: ***.  This patient previously presented with severe back/flank pain.  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  The spine ortho felt this should be treated with more conservative measures.    Pt current sx are: ***.  The patient is *** a smoker.  The patient's PMH, PSH, SH, and FamHx are unchanged from 09/22/17.  Current Outpatient Medications  Medication Sig Dispense Refill  . aspirin EC 81 MG tablet Take 81 mg by mouth daily.    . Cholecalciferol (D-3-5) 5000 UNITS capsule Take 5,000 Units by mouth daily.    Marland Kitchen ibuprofen (ADVIL,MOTRIN) 200 MG tablet Take 400-600 mg by mouth every 8 (eight) hours as needed for mild pain or moderate pain.    Marland Kitchen lisinopril-hydrochlorothiazide (PRINZIDE,ZESTORETIC) 10-12.5 MG tablet Take 1 tablet by mouth daily. 90 tablet 3  . metoprolol succinate (TOPROL-XL) 25 MG 24 hr tablet Take 1 tablet (25 mg total) by mouth daily. 90 tablet 3  . Multiple Vitamins-Minerals (MULTIVITAMIN GUMMIES ADULTS) CHEW Chew 2 each by mouth daily.    Marland Kitchen omeprazole (PRILOSEC) 20 MG capsule Take 1 capsule (20 mg total) by mouth daily. 90 capsule 3  . ranitidine (ZANTAC) 150 MG tablet TAKE 1 TABLET (150 MG TOTAL) BY MOUTH 2 (TWO) TIMES DAILY. (Patient not taking: Reported on 07/31/2017) 180 tablet 1  . simvastatin (ZOCOR) 40 MG tablet Take 1 tablet (40 mg total) by mouth every evening. 90 tablet 3  . venlafaxine XR (EFFEXOR XR) 75 MG 24 hr capsule Take 1 capsule (75 mg total) by mouth daily with breakfast. 90 capsule 3  . vitamin C (ASCORBIC ACID) 500 MG tablet Take 500 mg by mouth daily.     No current facility-administered medications for this visit.     On ROS today: ***, ***   Physical Examination  ***There were no  vitals filed for this visit. ***There is no height or weight on file to calculate BMI.  General {LOC:19197::"Somulent","Alert"}, {Orientation:19197::"Confused","O x 3"}, {Weight:19197::"Obese","Cachectic","WD"}, {General state of health:19197::"Ill appearing","Elderly","NAD"}  Pulmonary {Chest wall:19197::"Asx chest movement","Sym exp"}, {Air movt:19197::"Decreased *** air movt","good B air movt"}, {BS:19197::"rales on ***","rhonchi on ***","wheezing on ***","CTA B"}  Cardiac {Rhythm:19197::"Irregularly, irregular rate and rhythm","RRR, Nl S1, S2"}, {Murmur:19197::"Murmur present: ***","no Murmurs"}, {Rubs:19197::"Rub present: ***","No rubs"}, {Gallop:19197::"Gallop present: ***","No S3,S4"}  Vascular Vessel Right Left  Radial {Palpable:19197::"Not palpable","Faintly palpable","Palpable"} {Palpable:19197::"Not palpable","Faintly palpable","Palpable"}  Brachial {Palpable:19197::"Not palpable","Faintly palpable","Palpable"} {Palpable:19197::"Not palpable","Faintly palpable","Palpable"}  Carotid Palpable, {Bruit:19197::"Bruit present","No Bruit"} Palpable, {Bruit:19197::"Bruit present","No Bruit"}  Aorta Not palpable N/A  Femoral {Palpable:19197::"Not palpable","Faintly palpable","Palpable"} {Palpable:19197::"Not palpable","Faintly palpable","Palpable"}  Popliteal {Palpable:19197::"Prominently palpable","Not palpable"} {Palpable:19197::"Prominently palpable","Not palpable"}  PT {Palpable:19197::"Not palpable","Faintly palpable","Palpable"} {Palpable:19197::"Not palpable","Faintly palpable","Palpable"}  DP {Palpable:19197::"Not palpable","Faintly palpable","Palpable"} {Palpable:19197::"Not palpable","Faintly palpable","Palpable"}    Gastro- intestinal soft, {Distension:19197::"distended","non-distended"}, {TTP:19197::"TTP in *** quadrant","appropriate tenderness to palpation","non-tender to palpation"}, {Guarding:19197::"Guarding and rebound in *** quadrant","No guarding or rebound"}, {HSM:19197::"HSM  present","no HSM"}, {Masses:19197::"Mass present: ***","no masses"}, {Flank:19197::"CVAT on ***","Flank bruit present on ***","no CVAT B"}, {AAA:19197::"Palpable prominent aortic pulse","No palpable prominent aortic pulse"}, {Surgical incision:19197::"Surg incisions: ***","Surgical incisions well healed"," "}  Musculo- skeletal M/S 5/5 throughout {MS:19197::"except ***"," "}, Extremities without ischemic changes {MS:19197::"except ***"," "}, {Edema:19197::"Pitting edema present: ***","Non-pitting edema present: ***","No edema present"}, {Varicosities:19197::"Varicosities present: ***","No visible varicosities "}, {LDS:19197::"Lipodermatosclerosis present: ***","No Lipodermatosclerosis present"}  Neurologic Pain and light touch intact in extremities{CN:19197::" except  for decreased sensation in ***"," "}, Motor exam as listed above     Radiology     No results found.    Medical Decision Making   Morgan [name] is a 72 y.o. (May 26, 1946) female who presents with: small asx PAU x 2   ***  Thank you for allowing [place] to participate in this patient's care.  Adele Barthel, MD, FACS [facility] Office: [phone] Pager: [phone]

## 2017-10-12 ENCOUNTER — Other Ambulatory Visit: Payer: PPO

## 2017-10-12 ENCOUNTER — Ambulatory Visit: Payer: PPO | Admitting: Vascular Surgery

## 2017-10-19 ENCOUNTER — Encounter: Payer: Self-pay | Admitting: Primary Care

## 2017-10-19 ENCOUNTER — Ambulatory Visit (INDEPENDENT_AMBULATORY_CARE_PROVIDER_SITE_OTHER): Payer: PPO | Admitting: Primary Care

## 2017-10-19 VITALS — BP 120/68 | HR 73 | Temp 97.9°F | Ht 61.0 in | Wt 137.0 lb

## 2017-10-19 DIAGNOSIS — J019 Acute sinusitis, unspecified: Secondary | ICD-10-CM

## 2017-10-19 DIAGNOSIS — K5903 Drug induced constipation: Secondary | ICD-10-CM

## 2017-10-19 MED ORDER — POLYETHYLENE GLYCOL 3350 17 GM/SCOOP PO POWD: ORAL | 0 refills | Status: DC

## 2017-10-19 MED ORDER — AMOXICILLIN-POT CLAVULANATE 875-125 MG PO TABS: 1.0000 | ORAL_TABLET | Freq: Two times a day (BID) | ORAL | 0 refills | Status: DC

## 2017-10-19 NOTE — Progress Notes (Signed)
Subjective:    [name], female    DOB: 27-Sep-1946, 72 y.o.   MRN: [id number]  HPI  Morgan [name] is a 72 year old female who presents today with a chief complaint of sinus pressure and constipation.   1) Sinus Pressure: She also reports nasal congestion, sore throat, cough, chest congestion, headaches. She's blowing green mucous from her nasal cavity. Her symptoms began in early December 2018 which became worse around Christmas. She's taken Mucinex, "sinus medication", used her husband's inhaler without improvement. She denies fevers.   2) Constipation: Present since initiation of Effexor. She feels well managed on Effexor and doesn't want to switch. Originally started with ducloax OTC daily, now takes Senna every other night. She will experience daily bowel movements with four senna tablets daily.   Review of Systems  Constitutional: Positive for fatigue.  HENT: Positive for congestion and sinus pressure.   Respiratory: Positive for cough. Negative for shortness of breath.   Gastrointestinal: Positive for constipation. Negative for abdominal pain.       Past Medical History:  Diagnosis Date  . COPD (chronic obstructive pulmonary disease) ([place])   . GERD (gastroesophageal reflux disease)   . Hyperlipidemia   . Hypertension   . Seasonal allergies      Social History   Socioeconomic History  . Marital status: Married    Spouse name: Not on file  . Number of children: Not on file  . Years of education: Not on file  . Highest education level: Not on file  Social Needs  . Financial resource strain: Not on file  . Food insecurity - worry: Not on file  . Food insecurity - inability: Not on file  . Transportation needs - medical: Not on file  . Transportation needs - non-medical: Not on file  Occupational History  . Not on file  Tobacco Use  . Smoking status: Current Every Day Smoker    Packs/day: 1.00    Types: E-cigarettes  . Smokeless tobacco: Never Used    Substance and Sexual Activity  . Alcohol use: Yes    Alcohol/week: 0.0 oz    Comment: 2 to 3 beer a day  . Drug use: No  . Sexual activity: Not on file  Other Topics Concern  . Not on file  Social History Narrative   Married.   2 children, 3 grandchildren.   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   Enjoys spending time with her family, going to the beach and movies.     Past Surgical History:  Procedure Laterality Date  . BACK SURGERY      Family History  Problem Relation Age of Onset  . Heart disease Father        before age 63    Allergies  Allergen Reactions  . Azithromycin Nausea And Vomiting  . Codeine Itching    Makes me crazy    Current Outpatient Medications on File Prior to Visit  Medication Sig Dispense Refill  . aspirin EC 81 MG tablet Take 81 mg by mouth daily.    . Cholecalciferol (D-3-5) 5000 UNITS capsule Take 5,000 Units by mouth daily.    Marland Kitchen ibuprofen (ADVIL,MOTRIN) 200 MG tablet Take 400-600 mg by mouth every 8 (eight) hours as needed for mild pain or moderate pain.    Marland Kitchen lisinopril-hydrochlorothiazide (PRINZIDE,ZESTORETIC) 10-12.5 MG tablet Take 1 tablet by mouth daily. 90 tablet 3  . metoprolol succinate (TOPROL-XL) 25 MG 24 hr tablet Take 1 tablet (25  mg total) by mouth daily. 90 tablet 3  . Multiple Vitamins-Minerals (MULTIVITAMIN GUMMIES ADULTS) CHEW Chew 2 each by mouth daily.    Marland Kitchen omeprazole (PRILOSEC) 20 MG capsule Take 1 capsule (20 mg total) by mouth daily. 90 capsule 3  . ranitidine (ZANTAC) 150 MG tablet TAKE 1 TABLET (150 MG TOTAL) BY MOUTH 2 (TWO) TIMES DAILY. 180 tablet 1  . simvastatin (ZOCOR) 40 MG tablet Take 1 tablet (40 mg total) by mouth every evening. 90 tablet 3  . venlafaxine XR (EFFEXOR XR) 75 MG 24 hr capsule Take 1 capsule (75 mg total) by mouth daily with breakfast. 90 capsule 3  . vitamin C (ASCORBIC ACID) 500 MG tablet Take 500 mg by mouth daily.     No current facility-administered medications on file prior to  visit.     BP 120/68   Pulse 73   Temp 97.9 F (36.6 C) (Oral)   Ht 5\' 1"  (1.549 m)   Wt 137 lb (62.1 kg)   SpO2 98%   BMI 25.89 kg/m    Objective:   Physical Exam  Constitutional: She appears well-nourished. She appears ill.  HENT:  Right Ear: Tympanic membrane and ear canal normal.  Left Ear: Tympanic membrane and ear canal normal.  Nose: Mucosal edema present. Right sinus exhibits maxillary sinus tenderness and frontal sinus tenderness. Left sinus exhibits maxillary sinus tenderness and frontal sinus tenderness.  Mouth/Throat: Oropharynx is clear and moist.  Eyes: Conjunctivae are normal.  Neck: Neck supple.  Cardiovascular: Normal rate and regular rhythm.  Pulmonary/Chest: Effort normal. She has no wheezes. She has rhonchi in the left upper field. She has no rales.  Lymphadenopathy:    She has no cervical adenopathy.  Skin: Skin is warm and dry.          Assessment & Plan:  Acute Sinusitis:  Symptoms x 4 weeks, worse over the last 2 weeks. Exam today consistent for likely bacterial involvement. Rx for Augmentin course sent to pharmacy. Fluids, rest, follow up PRN.

## 2017-10-19 NOTE — Patient Instructions (Signed)
Start Augmentin antibiotics for the infection Take 1 tablet by mouth twice daily for 10 days.  Start Miralax daily. Mix 17 grams into clear liquid once to twice daily as needed for constipation.  It was a pleasure to see you today!   High-Fiber Diet Fiber, also called dietary fiber, is a type of carbohydrate found in fruits, vegetables, whole grains, and beans. A high-fiber diet can have many health benefits. Your health care provider may recommend a high-fiber diet to help:  Prevent constipation. Fiber can make your bowel movements more regular.  Lower your cholesterol.  Relieve hemorrhoids, uncomplicated diverticulosis, or irritable bowel syndrome.  Prevent overeating as part of a weight-loss plan.  Prevent heart disease, type 2 diabetes, and certain cancers.  What is my plan? The recommended daily intake of fiber includes:  38 grams for men under age 16.  89 grams for men over age 79.  74 grams for women under age 63.  50 grams for women over age 58.  You can get the recommended daily intake of dietary fiber by eating a variety of fruits, vegetables, grains, and beans. Your health care provider may also recommend a fiber supplement if it is not possible to get enough fiber through your diet. What do I need to know about a high-fiber diet?  Fiber supplements have not been widely studied for their effectiveness, so it is better to get fiber through food sources.  Always check the fiber content on thenutrition facts label of any prepackaged food. Look for foods that contain at least 5 grams of fiber per serving.  Ask your dietitian if you have questions about specific foods that are related to your condition, especially if those foods are not listed in the following section.  Increase your daily fiber consumption gradually. Increasing your intake of dietary fiber too quickly may cause bloating, cramping, or gas.  Drink plenty of water. Water helps you to digest fiber. What  foods can I eat? Grains Whole-grain breads. Multigrain cereal. Oats and oatmeal. Brown rice. Barley. Bulgur wheat. [place]. Bran muffins. Popcorn. Rye wafer crackers. Vegetables Sweet potatoes. Spinach. Kale. Artichokes. Cabbage. Broccoli. Green peas. Carrots. Squash. Fruits Berries. Pears. Apples. Oranges. Avocados. Prunes and raisins. Dried figs. Meats and Other Protein Sources [place], kidney, pinto, and soy beans. Split peas. Lentils. Nuts and seeds. Dairy Fiber-fortified yogurt. Beverages Fiber-fortified soy milk. Fiber-fortified orange juice. Other Fiber bars. The items listed above may not be a complete list of recommended foods or beverages. Contact your dietitian for more options. What foods are not recommended? Grains White bread. Pasta made with refined flour. White rice. Vegetables Fried potatoes. Canned vegetables. Well-cooked vegetables. Fruits Fruit juice. Cooked, strained fruit. Meats and Other Protein Sources Fatty cuts of meat. Fried Sales executive or fried fish. Dairy Milk. Yogurt. Cream cheese. Sour cream. Beverages Soft drinks. Other Cakes and pastries. Butter and oils. The items listed above may not be a complete list of foods and beverages to avoid. Contact your dietitian for more information. What are some tips for including high-fiber foods in my diet?  Eat a wide variety of high-fiber foods.  Make sure that half of all grains consumed each day are whole grains.  Replace breads and cereals made from refined flour or white flour with whole-grain breads and cereals.  Replace white rice with brown rice, bulgur wheat, or millet.  Start the day with a breakfast that is high in fiber, such as a cereal that contains at least 5 grams of fiber per serving.  Use beans in place of meat in soups, salads, or pasta.  Eat high-fiber snacks, such as berries, raw vegetables, nuts, or popcorn. This information is not intended to replace advice given to you by your health  care provider. Make sure you discuss any questions you have with your health care provider. Document Released: 09/25/2005 Document Revised: 03/02/2016 Document Reviewed: 03/10/2014 Elsevier Interactive Patient Education  [employer].

## 2017-10-19 NOTE — Assessment & Plan Note (Signed)
Secondary to Effexor. Will avoid changing Effexor as she's doing so well on this regimen.  Will have her try Miralax, increase water intake, increase fiber. She will update.

## 2017-11-05 NOTE — Progress Notes (Signed)
Established Abdominal Aortic PAU   History of Present Illness   Morgan [name] is a 72 y.o. (01/22/1946) female who presents for cc: chronic back pain.  Patient is due for a repeat epidural injection.  She notes on L side radiating pain to hip.  This patient previously presented with severe back/flank pain.  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  The spine ortho felt this should be treated with more conservative measures.  Subsequent CTA demonstrated two small PAUs.  Pt's sx are: unchanged.  The patient's PMH, PSH, SH, and FamHx are unchanged from 09/22/16.  Current Outpatient Medications  Medication Sig Dispense Refill  . amoxicillin-clavulanate (AUGMENTIN) 875-125 MG tablet Take 1 tablet by mouth 2 (two) times daily. 20 tablet 0  . aspirin EC 81 MG tablet Take 81 mg by mouth daily.    . Cholecalciferol (D-3-5) 5000 UNITS capsule Take 5,000 Units by mouth daily.    Marland Kitchen ibuprofen (ADVIL,MOTRIN) 200 MG tablet Take 400-600 mg by mouth every 8 (eight) hours as needed for mild pain or moderate pain.    Marland Kitchen lisinopril-hydrochlorothiazide (PRINZIDE,ZESTORETIC) 10-12.5 MG tablet Take 1 tablet by mouth daily. 90 tablet 3  . metoprolol succinate (TOPROL-XL) 25 MG 24 hr tablet Take 1 tablet (25 mg total) by mouth daily. 90 tablet 3  . Multiple Vitamins-Minerals (MULTIVITAMIN GUMMIES ADULTS) CHEW Chew 2 each by mouth daily.    Marland Kitchen omeprazole (PRILOSEC) 20 MG capsule Take 1 capsule (20 mg total) by mouth daily. 90 capsule 3  . polyethylene glycol powder (GLYCOLAX/MIRALAX) powder Mix 17 g into water once to twice daily as needed for constipation. 3350 g 0  . ranitidine (ZANTAC) 150 MG tablet TAKE 1 TABLET (150 MG TOTAL) BY MOUTH 2 (TWO) TIMES DAILY. 180 tablet 1  . simvastatin (ZOCOR) 40 MG tablet Take 1 tablet (40 mg total) by mouth every evening. 90 tablet 3  . venlafaxine XR  (EFFEXOR XR) 75 MG 24 hr capsule Take 1 capsule (75 mg total) by mouth daily with breakfast. 90 capsule 3  . vitamin C (ASCORBIC ACID) 500 MG tablet Take 500 mg by mouth daily.     No current facility-administered medications for this visit.     On ROS today: recurrent back pain, no thromboembolic sx   Physical Examination   Vitals:   11/09/17 1336  BP: 132/74  Pulse: 68  Resp: 18  Temp: (!) 97.4 F (36.3 C)  TempSrc: Oral  SpO2: 98%  Weight: 137 lb (62.1 kg)  Height: 5\' 1"  (1.549 m)   Body mass index is 25.89 kg/m.  General Alert, O x 3, WD, NAD  Pulmonary Sym exp, good B air movt, CTA B  Cardiac RRR, Nl S1, S2, no Murmurs, No rubs, No S3,S4  Vascular Vessel Right Left  Radial Palpable Palpable  Brachial Palpable Palpable  Carotid Palpable, No Bruit Palpable, No Bruit  Aorta Not palpable N/A  Femoral Palpable Palpable  Popliteal Not palpable Not palpable  PT Faintly palpable Faintly palpable  DP Faintly palpable Faintly palpable    Gastro- intestinal soft, non-distended, non-tender to palpation, No guarding or rebound, no HSM, no masses, no CVAT B, No palpable prominent aortic pulse,    Musculo- skeletal M/S 5/5 throughout  , Extremities without ischemic changes  , No edema present, No visible varicosities , No Lipodermatosclerosis present  Neurologic Pain and light touch intact in extremities , Motor exam  as listed above   Radiology     Ct Angio Abdomen Pelvis  W &/or Wo Contrast  Result Date: 11/09/2017 CLINICAL DATA:  Aortic atherosclerosis follow-up EXAM: CTA ABDOMEN AND PELVIS wITHOUT AND WITH CONTRAST TECHNIQUE: Multidetector CT imaging of the abdomen and pelvis was performed using the standard protocol during bolus administration of intravenous contrast. Multiplanar reconstructed images and MIPs were obtained and reviewed to evaluate the vascular anatomy. CONTRAST:  73mL ISOVUE-370 IOPAMIDOL (ISOVUE-370) INJECTION 76% COMPARISON:  09/08/2016 FINDINGS: VASCULAR  Aorta: Atherosclerotic calcifications and changes involving the abdominal aorta are redemonstrated. There are 2 left-sided penetrating atherosclerotic ulcers that are grossly unchanged in size measuring 9 mm and 7 mm compared with 9 mm and 6 mm in length on the prior study. The aorta is nonaneurysmal. There is no luminal narrowing. Celiac: Atherosclerotic calcification at the origin. Patent. Branch vessels patent. SMA: Atherosclerotic calcifications at the origin. Patent. Branch vessels grossly patent. Renals: There is atherosclerotic calcification and some smooth plaque at the origin of the left renal artery without significant narrowing. There is similar atherosclerotic calcification and smooth plaque at the origin of the right renal artery without significant narrowing. IMA: Diminutive and grossly patent. Inflow: There are minimal atherosclerotic calcifications in the bilateral common iliac arteries without significant narrowing. There are nonaneurysmal. Internal and external iliac arteries are minimally diseased and are widely patent without aneurysmal dilatation. Proximal Outflow: Grossly patent. Veins: No obvious DVT. Review of the MIP images confirms the above findings. NON-VASCULAR Lower chest: 4 mm right middle lobe nodule is stable on image 1. Hepatobiliary: Unremarkable Pancreas: Unremarkable Spleen: Unremarkable Adrenals/Urinary Tract: Stable left adrenal nodule. Kidneys and right adrenal gland are within normal limits. Bladder is decompressed. Stomach/Bowel: Stomach and duodenum are within normal limits. No obvious mass in the colon. No evidence of small-bowel obstruction. Lymphatic: No abnormal retroperitoneal adenopathy. Reproductive: No evidence of pelvic mass. Adnexa are within normal limits. Other: No free fluid. Musculoskeletal: Scoliosis of the thoracolumbar junction. Stabilization hardware in the lumbosacral spine. These findings are not significantly changed. Inferior endplate depression at J68  is stable. Mild compression deformity at T11 is stable. 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 There is no significant luminal narrowing or aortic aneurysm. Aortic Atherosclerosis (ICD10-I70.0). NON-VASCULAR Chronic changes. Electronically Signed   By: Marybelle Killings M.D.   On: 11/09/2017 13:46    Based on my review of the CTA, there has been essentially no changes since last CT.   Medical Decision Making   MARTICIA REIFSCHNEIDER is a 72 y.o. female who presents small asx aortic PAU x 2   We discussed proceeding with q2 year abd/pelvic CTA to limit radiation exposure given pt significant radiation history for her spinal evaluations.  Patient will follow up with [place] in the event any acute changes in back or abdominal pain.  Thank you for allowing [place] to participate in this patient's care.   Adele Barthel, MD, FACS [facility] Office: [phone] Pager: [phone]

## 2017-11-09 ENCOUNTER — Ambulatory Visit: Payer: PPO | Admitting: Vascular Surgery

## 2017-11-09 ENCOUNTER — Encounter: Payer: Self-pay | Admitting: Vascular Surgery

## 2017-11-09 ENCOUNTER — Ambulatory Visit
Admission: RE | Admit: 2017-11-09 | Discharge: 2017-11-09 | Disposition: A | Discharge status: Home / Self Care | Payer: PPO | Source: Ambulatory Visit | Attending: Vascular Surgery | Admitting: Vascular Surgery

## 2017-11-09 VITALS — BP 132/74 | HR 68 | Temp 97.4°F | Resp 18 | Ht 61.0 in | Wt 137.0 lb

## 2017-11-09 DIAGNOSIS — I700 Atherosclerosis of aorta: Secondary | ICD-10-CM

## 2017-11-09 MED ORDER — IOPAMIDOL (ISOVUE-370) INJECTION 76%
75.0000 mL | Freq: Once | INTRAVENOUS | Status: AC | PRN
  Administered 2017-11-09: 75 mL via INTRAVENOUS

## 2017-11-12 DIAGNOSIS — H2513 Age-related nuclear cataract, bilateral: Secondary | ICD-10-CM | POA: Diagnosis not present

## 2017-12-10 ENCOUNTER — Other Ambulatory Visit: Payer: Self-pay | Admitting: Sports Medicine

## 2017-12-10 DIAGNOSIS — M810 Age-related osteoporosis without current pathological fracture: Secondary | ICD-10-CM

## 2017-12-12 ENCOUNTER — Ambulatory Visit (INDEPENDENT_AMBULATORY_CARE_PROVIDER_SITE_OTHER): Payer: PPO | Admitting: Primary Care

## 2017-12-12 ENCOUNTER — Encounter: Payer: Self-pay | Admitting: Primary Care

## 2017-12-12 VITALS — BP 134/78 | HR 87 | Temp 98.0°F | Ht 61.0 in | Wt 139.5 lb

## 2017-12-12 DIAGNOSIS — J069 Acute upper respiratory infection, unspecified: Secondary | ICD-10-CM

## 2017-12-12 MED ORDER — ALBUTEROL SULFATE HFA 108 (90 BASE) MCG/ACT IN AERS: 2.0000 | INHALATION_SPRAY | RESPIRATORY_TRACT | 0 refills | Status: DC | PRN

## 2017-12-12 MED ORDER — PREDNISONE 20 MG PO TABS: ORAL_TABLET | ORAL | 0 refills | Status: DC

## 2017-12-12 MED ORDER — BENZONATATE 200 MG PO CAPS: 200.0000 mg | ORAL_CAPSULE | Freq: Three times a day (TID) | ORAL | 0 refills | Status: DC | PRN

## 2017-12-12 NOTE — Progress Notes (Signed)
Subjective:    Patient ID: SOLE LENGACHER, female    DOB: [birth year], 72 y.o.   MRN: [id number]  HPI  Ms. Mariner is a 72 year old female with a history of tobacco abuse who presents today with a chief complaint of cough.   She also reports chest congestion, sinus pressure, sore throat, shortness of breath, wheezing. Two weeks ago she was diagnosed with a dental abscess and treated with Amoxil for one week, last dose was last night. Four days ago she began to experience sore throat with dry, hacking cough. She's been taking Mucinex, Aleve, and Amoxil, Nyquil without improvement in her cough.   Review of Systems  Constitutional: Negative for fever.  HENT: Positive for congestion and sinus pressure. Negative for ear pain.   Respiratory: Positive for cough, shortness of breath and wheezing.        Past Medical History:  Diagnosis Date  . COPD (chronic obstructive pulmonary disease) ([place])   . GERD (gastroesophageal reflux disease)   . Hyperlipidemia   . Hypertension   . Seasonal allergies      Social History   Socioeconomic History  . Marital status: Married    Spouse name: Not on file  . Number of children: Not on file  . Years of education: Not on file  . Highest education level: Not on file  Social Needs  . Financial resource strain: Not on file  . Food insecurity - worry: Not on file  . Food insecurity - inability: Not on file  . Transportation needs - medical: Not on file  . Transportation needs - non-medical: Not on file  Occupational History  . Not on file  Tobacco Use  . Smoking status: Current Every Day Smoker    Packs/day: 1.00    Types: E-cigarettes, Cigarettes  . Smokeless tobacco: Never Used  Substance and Sexual Activity  . Alcohol use: Yes    Alcohol/week: 0.0 oz    Comment: 2 to 3 beer a day  . Drug use: No  . Sexual activity: Not on file  Other Topics Concern  . Not on file  Social History Narrative   Married.   2 children, 3 grandchildren.   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   Enjoys spending time with her family, going to the beach and movies.     Past Surgical History:  Procedure Laterality Date  . BACK SURGERY      Family History  Problem Relation Age of Onset  . Heart disease Father        before age 45    Allergies  Allergen Reactions  . Azithromycin Nausea And Vomiting  . Codeine Itching    Makes me crazy    Current Outpatient Medications on File Prior to Visit  Medication Sig Dispense Refill  . aspirin EC 81 MG tablet Take 81 mg by mouth daily.    . Cholecalciferol (D-3-5) 5000 UNITS capsule Take 5,000 Units by mouth daily.    Marland Kitchen ibuprofen (ADVIL,MOTRIN) 200 MG tablet Take 400-600 mg by mouth every 8 (eight) hours as needed for mild pain or moderate pain.    Marland Kitchen lisinopril-hydrochlorothiazide (PRINZIDE,ZESTORETIC) 10-12.5 MG tablet Take 1 tablet by mouth daily. 90 tablet 3  . metoprolol succinate (TOPROL-XL) 25 MG 24 hr tablet Take 1 tablet (25 mg total) by mouth daily. 90 tablet 3  . Multiple Vitamins-Minerals (MULTIVITAMIN GUMMIES ADULTS) CHEW Chew 2 each by mouth daily.    Marland Kitchen omeprazole (PRILOSEC) 20 MG capsule Take 1  capsule (20 mg total) by mouth daily. 90 capsule 3  . polyethylene glycol powder (GLYCOLAX/MIRALAX) powder Mix 17 g into water once to twice daily as needed for constipation. 3350 g 0  . ranitidine (ZANTAC) 150 MG tablet TAKE 1 TABLET (150 MG TOTAL) BY MOUTH 2 (TWO) TIMES DAILY. 180 tablet 1  . simvastatin (ZOCOR) 40 MG tablet Take 1 tablet (40 mg total) by mouth every evening. 90 tablet 3  . venlafaxine XR (EFFEXOR XR) 75 MG 24 hr capsule Take 1 capsule (75 mg total) by mouth daily with breakfast. 90 capsule 3  . vitamin C (ASCORBIC ACID) 500 MG tablet Take 500 mg by mouth daily.     No current facility-administered medications on file prior to visit.     BP 134/78   Pulse 87   Temp 98 F (36.7 C) (Oral)   Ht 5\' 1"  (1.549 m)   Wt 139 lb 8 oz (63.3 kg)   SpO2 96%   BMI 26.36  kg/m    Objective:   Physical Exam  Constitutional: She appears well-nourished.  HENT:  Right Ear: Tympanic membrane and ear canal normal.  Left Ear: Tympanic membrane and ear canal normal.  Nose: Mucosal edema present. Right sinus exhibits maxillary sinus tenderness. Right sinus exhibits no frontal sinus tenderness. Left sinus exhibits maxillary sinus tenderness. Left sinus exhibits no frontal sinus tenderness.  Mouth/Throat: Oropharynx is clear and moist.  Eyes: Conjunctivae are normal.  Neck: Neck supple.  Cardiovascular: Normal rate and regular rhythm.  Pulmonary/Chest: Effort normal and breath sounds normal. She has no wheezes. She has no rales.  Lymphadenopathy:    She has no cervical adenopathy.  Skin: Skin is warm and dry.          Assessment & Plan:  URI:  Cough, sore throat, sinus pressure x 4 days. Completed antibiotic course last night for dental abscess. Exam today without evidence of pneumonia, doesn't appear sickly, vitals stable. Suspect viral involvement at this point, especially given recent antibiotic use. Treat with prednisone burst and albuterol inhaler given SOB with wheezing. Fluids, rest, follow up PRN.  [name], NP

## 2017-12-12 NOTE — Patient Instructions (Addendum)
Start prednisone tablets. Take 2 tablets daily for 5 days.   Shortness of Breath/Wheezing/Cough: Use the albuterol inhaler. Inhale 2 puffs into the lungs every 4 to six hours as needed for wheezing, cough, and/or shortness of breath.   You may take Benzonatate capsules for cough. Take 1 capsule by mouth three times daily as needed for cough.  Ensure you are staying hydrated with water and rest.  Please call me if no improvement in 3-4 days.  It was a pleasure to see you today!

## 2017-12-14 ENCOUNTER — Telehealth: Payer: Self-pay | Admitting: Primary Care

## 2017-12-14 DIAGNOSIS — J069 Acute upper respiratory infection, unspecified: Secondary | ICD-10-CM

## 2017-12-14 MED ORDER — PREDNISONE 10 MG PO TABS: ORAL_TABLET | ORAL | 0 refills | Status: DC

## 2017-12-14 NOTE — Telephone Encounter (Signed)
Prescription for prednisone 10 mg sent to pharmacy.  Take 4 tablets by mouth once daily for 5 days.

## 2017-12-14 NOTE — Telephone Encounter (Signed)
Received a faxed request from [facility]. They are notifying  that prednisone 20 mg is on back order. They have 10 mg's in stock. Can they get a new script?

## 2017-12-21 ENCOUNTER — Ambulatory Visit
Admission: RE | Admit: 2017-12-21 | Discharge: 2017-12-21 | Disposition: A | Discharge status: Home / Self Care | Payer: PPO | Source: Ambulatory Visit | Attending: Sports Medicine | Admitting: Sports Medicine

## 2017-12-21 DIAGNOSIS — Z780 Asymptomatic menopausal state: Secondary | ICD-10-CM | POA: Diagnosis not present

## 2017-12-21 DIAGNOSIS — M810 Age-related osteoporosis without current pathological fracture: Secondary | ICD-10-CM | POA: Diagnosis not present

## 2018-01-26 ENCOUNTER — Other Ambulatory Visit: Payer: Self-pay | Admitting: Primary Care

## 2018-01-26 DIAGNOSIS — K219 Gastro-esophageal reflux disease without esophagitis: Secondary | ICD-10-CM

## 2018-02-06 DIAGNOSIS — M4120 Other idiopathic scoliosis, site unspecified: Secondary | ICD-10-CM | POA: Diagnosis not present

## 2018-02-08 DIAGNOSIS — M545 Low back pain: Secondary | ICD-10-CM | POA: Diagnosis not present

## 2018-02-08 DIAGNOSIS — M4726 Other spondylosis with radiculopathy, lumbar region: Secondary | ICD-10-CM | POA: Diagnosis not present

## 2018-02-08 DIAGNOSIS — M5416 Radiculopathy, lumbar region: Secondary | ICD-10-CM | POA: Diagnosis not present

## 2018-02-08 DIAGNOSIS — M4186 Other forms of scoliosis, lumbar region: Secondary | ICD-10-CM | POA: Diagnosis not present

## 2018-02-28 DIAGNOSIS — R5383 Other fatigue: Secondary | ICD-10-CM | POA: Diagnosis not present

## 2018-02-28 DIAGNOSIS — M810 Age-related osteoporosis without current pathological fracture: Secondary | ICD-10-CM | POA: Diagnosis not present

## 2018-02-28 DIAGNOSIS — E559 Vitamin D deficiency, unspecified: Secondary | ICD-10-CM | POA: Diagnosis not present

## 2018-03-05 DIAGNOSIS — E559 Vitamin D deficiency, unspecified: Secondary | ICD-10-CM | POA: Diagnosis not present

## 2018-03-05 DIAGNOSIS — M810 Age-related osteoporosis without current pathological fracture: Secondary | ICD-10-CM | POA: Diagnosis not present

## 2018-05-30 DIAGNOSIS — Z6827 Body mass index (BMI) 27.0-27.9, adult: Secondary | ICD-10-CM | POA: Diagnosis not present

## 2018-05-30 DIAGNOSIS — M4120 Other idiopathic scoliosis, site unspecified: Secondary | ICD-10-CM | POA: Diagnosis not present

## 2018-05-30 DIAGNOSIS — I10 Essential (primary) hypertension: Secondary | ICD-10-CM | POA: Diagnosis not present

## 2018-06-07 DIAGNOSIS — M5416 Radiculopathy, lumbar region: Secondary | ICD-10-CM | POA: Diagnosis not present

## 2018-06-07 DIAGNOSIS — M47816 Spondylosis without myelopathy or radiculopathy, lumbar region: Secondary | ICD-10-CM | POA: Diagnosis not present

## 2018-07-30 ENCOUNTER — Other Ambulatory Visit: Payer: Self-pay | Admitting: Primary Care

## 2018-07-30 DIAGNOSIS — F419 Anxiety disorder, unspecified: Secondary | ICD-10-CM

## 2018-07-30 DIAGNOSIS — F32A Depression, unspecified: Secondary | ICD-10-CM

## 2018-07-30 DIAGNOSIS — E785 Hyperlipidemia, unspecified: Secondary | ICD-10-CM

## 2018-07-30 DIAGNOSIS — F329 Major depressive disorder, single episode, unspecified: Secondary | ICD-10-CM

## 2018-07-30 DIAGNOSIS — I10 Essential (primary) hypertension: Secondary | ICD-10-CM

## 2018-07-30 DIAGNOSIS — K219 Gastro-esophageal reflux disease without esophagitis: Secondary | ICD-10-CM

## 2018-07-31 NOTE — Telephone Encounter (Signed)
Needs office visit for further refills. Please notify her that I will send a 30 day supply to her pharmacy for now, she will have to follow up for further refills.

## 2018-07-31 NOTE — Telephone Encounter (Signed)
Last prescribed on 07/31/2017  Last office visit on 07/31/2017

## 2018-08-02 NOTE — Telephone Encounter (Signed)
Spoken and notified patient of Morgan [name] comments. Patient verbalized understanding.  OV on 08/09/2018

## 2018-08-09 ENCOUNTER — Ambulatory Visit (INDEPENDENT_AMBULATORY_CARE_PROVIDER_SITE_OTHER): Payer: PPO | Admitting: Primary Care

## 2018-08-09 ENCOUNTER — Encounter: Payer: Self-pay | Admitting: Primary Care

## 2018-08-09 ENCOUNTER — Other Ambulatory Visit: Payer: Self-pay | Admitting: Primary Care

## 2018-08-09 VITALS — BP 140/80 | HR 78 | Temp 98.1°F | Ht 61.0 in | Wt 141.5 lb

## 2018-08-09 DIAGNOSIS — Z122 Encounter for screening for malignant neoplasm of respiratory organs: Secondary | ICD-10-CM | POA: Diagnosis not present

## 2018-08-09 DIAGNOSIS — E785 Hyperlipidemia, unspecified: Secondary | ICD-10-CM

## 2018-08-09 DIAGNOSIS — R5383 Other fatigue: Secondary | ICD-10-CM | POA: Diagnosis not present

## 2018-08-09 DIAGNOSIS — Z23 Encounter for immunization: Secondary | ICD-10-CM | POA: Diagnosis not present

## 2018-08-09 DIAGNOSIS — F17200 Nicotine dependence, unspecified, uncomplicated: Secondary | ICD-10-CM | POA: Insufficient documentation

## 2018-08-09 DIAGNOSIS — F419 Anxiety disorder, unspecified: Secondary | ICD-10-CM

## 2018-08-09 DIAGNOSIS — I10 Essential (primary) hypertension: Secondary | ICD-10-CM

## 2018-08-09 DIAGNOSIS — K219 Gastro-esophageal reflux disease without esophagitis: Secondary | ICD-10-CM

## 2018-08-09 DIAGNOSIS — M810 Age-related osteoporosis without current pathological fracture: Secondary | ICD-10-CM | POA: Diagnosis not present

## 2018-08-09 DIAGNOSIS — Z720 Tobacco use: Secondary | ICD-10-CM | POA: Diagnosis not present

## 2018-08-09 DIAGNOSIS — F329 Major depressive disorder, single episode, unspecified: Secondary | ICD-10-CM

## 2018-08-09 DIAGNOSIS — I700 Atherosclerosis of aorta: Secondary | ICD-10-CM

## 2018-08-09 DIAGNOSIS — J449 Chronic obstructive pulmonary disease, unspecified: Secondary | ICD-10-CM | POA: Diagnosis not present

## 2018-08-09 DIAGNOSIS — E871 Hypo-osmolality and hyponatremia: Secondary | ICD-10-CM

## 2018-08-09 DIAGNOSIS — F32A Depression, unspecified: Secondary | ICD-10-CM

## 2018-08-09 DIAGNOSIS — J441 Chronic obstructive pulmonary disease with (acute) exacerbation: Secondary | ICD-10-CM

## 2018-08-09 HISTORY — DX: Chronic obstructive pulmonary disease with (acute) exacerbation: J44.1

## 2018-08-09 LAB — COMPREHENSIVE METABOLIC PANEL
ALT: 16 U/L (ref 0–35)
AST: 19 U/L (ref 0–37)
Albumin: 4.5 g/dL (ref 3.5–5.2)
Alkaline Phosphatase: 57 U/L (ref 39–117)
BUN: 8 mg/dL (ref 6–23)
CO2: 28 mEq/L (ref 19–32)
Calcium: 9.4 mg/dL (ref 8.4–10.5)
Chloride: 91 mEq/L — ABNORMAL LOW (ref 96–112)
Creatinine, Ser: 0.74 mg/dL (ref 0.40–1.20)
GFR: 82.26 mL/min (ref >60.00)
Glucose, Bld: 116 mg/dL — ABNORMAL HIGH (ref 70–99)
Potassium: 3.4 mEq/L — ABNORMAL LOW (ref 3.5–5.1)
Sodium: 128 mEq/L — ABNORMAL LOW (ref 135–145)
Total Bilirubin: 0.5 mg/dL (ref 0.2–1.2)
Total Protein: 7.1 g/dL (ref 6.0–8.3)

## 2018-08-09 LAB — LIPID PANEL
Cholesterol: 183 mg/dL (ref 0–200)
HDL: 94 mg/dL (ref >39.00)
LDL Cholesterol: 69 mg/dL (ref 0–99)
NonHDL: 88.66
Total CHOL/HDL Ratio: 2
Triglycerides: 100 mg/dL (ref 0.0–149.0)
VLDL: 20 mg/dL (ref 0.0–40.0)

## 2018-08-09 LAB — CBC
HCT: 38.4 % (ref 36.0–46.0)
Hemoglobin: 13 g/dL (ref 12.0–15.0)
MCHC: 33.9 g/dL (ref 30.0–36.0)
MCV: 98.5 fl (ref 78.0–100.0)
Platelets: 463 10*3/uL — ABNORMAL HIGH (ref 150.0–400.0)
RBC: 3.9 Mil/uL (ref 3.87–5.11)
RDW: 13.1 % (ref 11.5–15.5)
WBC: 8.5 10*3/uL (ref 4.0–10.5)

## 2018-08-09 LAB — VITAMIN B12: Vitamin B-12: 348 pg/mL (ref 211–911)

## 2018-08-09 LAB — HEMOGLOBIN A1C: Hgb A1c MFr Bld: 6.2 % (ref 4.6–6.5)

## 2018-08-09 LAB — TSH: TSH: 0.68 u[IU]/mL (ref 0.35–4.50)

## 2018-08-09 MED ORDER — OMEPRAZOLE 20 MG PO CPDR: 20.0000 mg | DELAYED_RELEASE_CAPSULE | Freq: Every day | ORAL | 3 refills | Status: DC

## 2018-08-09 MED ORDER — RANITIDINE HCL 150 MG PO TABS: 150.0000 mg | ORAL_TABLET | Freq: Every day | ORAL | 3 refills | Status: DC

## 2018-08-09 MED ORDER — ZOSTER VAC RECOMB ADJUVANTED 50 MCG/0.5ML IM SUSR: 0.5000 mL | Freq: Once | INTRAMUSCULAR | 1 refills | Status: AC

## 2018-08-09 MED ORDER — ZOSTER VAC RECOMB ADJUVANTED 50 MCG/0.5ML IM SUSR: 0.5000 mL | Freq: Once | INTRAMUSCULAR | 0 refills | Status: DC

## 2018-08-09 NOTE — Assessment & Plan Note (Signed)
Doing well on omeprazole daily and Zantac PRN. Refills sent for both.

## 2018-08-09 NOTE — Assessment & Plan Note (Signed)
Doing well on venlafaxine ER 75 mg, continue same. Denies SI/HI.

## 2018-08-09 NOTE — Assessment & Plan Note (Signed)
Borderline too high today. Continue current regimen for now. Will have her start monitoring home readings. She will report readings that are consistently at or above 140/90. BMP pending.

## 2018-08-09 NOTE — Assessment & Plan Note (Signed)
50+ pack year history of tobacco abuse, not ready to quit. Referral placed for lung cancer screening.

## 2018-08-09 NOTE — Addendum Note (Signed)
Addended by: Jacqualin Combes on: 08/09/2018 01:07 PM   Modules accepted: Orders

## 2018-08-09 NOTE — Assessment & Plan Note (Signed)
Managed on Prolia every 6 months. Bone density scan from March 2019 with some improvement. Continue Prolia.

## 2018-08-09 NOTE — Progress Notes (Signed)
Subjective:    [name], female    DOB: [birth year], 72 y.o.   MRN: [id number]  HPI  Morgan [name] is a 72 year old female who presents today for follow up. She would like an influenza vaccination and has never had the shingrix vaccination.   1) Essential Hypertension: Currently managed on lisinopril-HCTZ 10-12.5 mg daily, metoprolol succinate 25 mg. She is not checking her BP at home. She denies chest pain, headaches, dizziness.   BP Readings from Last 3 Encounters:  08/09/18 140/80  12/12/17 134/78  11/09/17 132/74     2) Hyperlipidemia/Atherosclerosis of Aorta: Currently managed on Simvastatin 40 mg, aspirin 81 mg. Her last lipid panel with TC of 210, HDL 96, LDL of 94. She continues to smoke cigarettes, not ready to quit. She is compliant to her simvastatin daily.  3) Anxiety and Depression: Currently managed on venlafaxine ER 75 mg daily. Overall she's doing much better, feels well managed on venlafaxine. Denies SI/HI.  4) GERD: Currently managed on omeprazole 20 mg once daily, and ranitidine 150 mg as needed. She ran out of her omeprazole so she's been taking ranitidine daily to twice daily.   5) Osteoporosis: Currently managed on Prolia for which she gets twice annually. Recent bone density scan in March 2019 with improvement. Following with orthopedics.   6) COPD: Using her albuterol twice daily for which she's been doing so for the last 1 month. Prior to that she used her albuterol 1-2 times weekly. She denies fevers. She thinks she's having more difficulty with SOB now due to seasonal changes. She has noticed feeling more fatigued and out of energy.   Review of Systems  Constitutional: Negative for fever.  HENT: Negative for congestion.   Respiratory: Negative for cough.        Intermittent shortness of breath recently  Cardiovascular: Negative for chest pain.  Neurological: Negative for dizziness and headaches.  Psychiatric/Behavioral: Negative for  suicidal ideas.       See HPI       Past Medical History:  Diagnosis Date  . COPD (chronic obstructive pulmonary disease) ([place])   . GERD (gastroesophageal reflux disease)   . Hyperlipidemia   . Hypertension   . Seasonal allergies      Social History   Socioeconomic History  . Marital status: Married    Spouse name: Not on file  . Number of children: Not on file  . Years of education: Not on file  . Highest education level: Not on file  Occupational History  . Not on file  Social Needs  . Financial resource strain: Not on file  . Food insecurity:    Worry: Not on file    Inability: Not on file  . Transportation needs:    Medical: Not on file    Non-medical: Not on file  Tobacco Use  . Smoking status: Current Every Day Smoker    Packs/day: 1.00    Types: E-cigarettes, Cigarettes  . Smokeless tobacco: Never Used  Substance and Sexual Activity  . Alcohol use: Yes    Alcohol/week: 0.0 standard drinks    Comment: 2 to 3 beer a day  . Drug use: No  . Sexual activity: Not on file  Lifestyle  . Physical activity:    Days per week: Not on file    Minutes per session: Not on file  . Stress: Not on file  Relationships  . Social connections:    Talks on phone: Not on file  Gets together: Not on file    Attends religious service: Not on file    Active member of club or organization: Not on file    Attends meetings of clubs or organizations: Not on file    Relationship status: Not on file  . Intimate partner violence:    Fear of current or ex partner: Not on file    Emotionally abused: Not on file    Physically abused: Not on file    Forced sexual activity: Not on file  Other Topics Concern  . Not on file  Social History Narrative   Married.   2 children, 3 grandchildren.   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   Enjoys spending time with her family, going to the beach and movies.     Past Surgical History:  Procedure Laterality Date  . BACK SURGERY       Family History  Problem Relation Age of Onset  . Heart disease Father        before age 51    Allergies  Allergen Reactions  . Azithromycin Nausea And Vomiting  . Codeine Itching    Makes me crazy    Current Outpatient Medications on File Prior to Visit  Medication Sig Dispense Refill  . albuterol (PROAIR HFA) 108 (90 Base) MCG/ACT inhaler Inhale 2 puffs into the lungs every 4 (four) hours as needed for wheezing or shortness of breath. 1 Inhaler 0  . aspirin EC 81 MG tablet Take 81 mg by mouth daily.    . Cholecalciferol (D-3-5) 5000 UNITS capsule Take 5,000 Units by mouth daily.    Marland Kitchen ibuprofen (ADVIL,MOTRIN) 200 MG tablet Take 400-600 mg by mouth every 8 (eight) hours as needed for mild pain or moderate pain.    Marland Kitchen lisinopril-hydrochlorothiazide (PRINZIDE,ZESTORETIC) 10-12.5 MG tablet Take 1 tablet by mouth daily. For blood pressure. 30 tablet 0  . metoprolol succinate (TOPROL-XL) 25 MG 24 hr tablet Take 1 tablet (25 mg total) by mouth daily. For blood pressure. 30 tablet 0  . Multiple Vitamins-Minerals (MULTIVITAMIN GUMMIES ADULTS) CHEW Chew 2 each by mouth daily.    . polyethylene glycol powder (GLYCOLAX/MIRALAX) powder Mix 17 g into water once to twice daily as needed for constipation. 3350 g 0  . simvastatin (ZOCOR) 40 MG tablet Take 1 tablet (40 mg total) by mouth every evening. For cholesterol. 30 tablet 0  . venlafaxine XR (EFFEXOR-XR) 75 MG 24 hr capsule Take 1 capsule (75 mg total) by mouth daily with breakfast. For anxiety/depression. 30 capsule 0  . vitamin C (ASCORBIC ACID) 500 MG tablet Take 500 mg by mouth daily.     No current facility-administered medications on file prior to visit.     BP 140/80   Pulse 78   Temp 98.1 F (36.7 C) (Oral)   Ht 5\' 1"  (1.549 m)   Wt 141 lb 8 oz (64.2 kg)   SpO2 98%   BMI 26.74 kg/m    Objective:   Physical Exam  Constitutional: She appears well-nourished.  Neck: Neck supple.  Cardiovascular: Normal rate and regular  rhythm.  Respiratory: Effort normal and breath sounds normal. She has no wheezes.  Skin: Skin is warm and dry.  Psychiatric: She has a normal mood and affect.           Assessment & Plan:

## 2018-08-09 NOTE — Assessment & Plan Note (Signed)
Repeat lipid panel pending. Continue simvastatin. Strongly advised she quit smoking.

## 2018-08-09 NOTE — Patient Instructions (Signed)
Stop by the lab prior to leaving today. I will notify you of your results once received.   Take the shingles vaccination to your pharmacy for administration.  Continue Prolia for osteoporosis.  You will be contacted regarding your referral for the lung cancer screening program.  Please let us know if you have not been contacted within one week.   Please notify me if you continue to require use of your albuterol inhaler more than three times weekly.   Monitor your blood pressure at home and notify me if you see readings consistently at or above 140/90.  Stop smoking as discussed.   It was a pleasure to see you today!

## 2018-08-09 NOTE — Assessment & Plan Note (Signed)
Overall stable except for seasonal changes. Discussed for her to notify me if she continues to require use of her albuterol inhaler more than three times weekly. Consider LABA/ICS if needed. She will update. Lungs clear on exam.

## 2018-08-09 NOTE — Assessment & Plan Note (Signed)
Continues to smoke, compliant to statin and aspirin. Strongly advised she quit smoking.

## 2018-08-12 ENCOUNTER — Encounter: Payer: Self-pay | Admitting: *Deleted

## 2018-08-12 ENCOUNTER — Telehealth: Payer: Self-pay | Admitting: *Deleted

## 2018-08-12 DIAGNOSIS — Z122 Encounter for screening for malignant neoplasm of respiratory organs: Secondary | ICD-10-CM

## 2018-08-12 NOTE — Telephone Encounter (Signed)
Received a referral for initial lung cancer screening scan.  Contacted the patient and obtained their smoking history, current smoker at 1.5ppd normally been at 1 ppd for 56 years with a 48 pkyr history   as well as answering questions related to screening process.  Patient denies signs of lung cancer such as weight loss or hemoptysis at this time.  Patient denies comorbidity that would prevent curative treatment if lung cancer were found.  Patient is scheduled for the Shared Decision Making Visit and CT scan on 09-10-18@1315 .

## 2018-08-13 ENCOUNTER — Telehealth: Payer: Self-pay | Admitting: *Deleted

## 2018-08-13 NOTE — Telephone Encounter (Signed)
Lab appt on 08/16/2018

## 2018-08-13 NOTE — Telephone Encounter (Signed)
Pt called and wanted to speak to nurse concerning phone called message.

## 2018-08-13 NOTE — Telephone Encounter (Signed)
Per DPR, left detail message of Kate Clark's comments for patient to call back to schedule a lab appointment. 

## 2018-08-13 NOTE — Telephone Encounter (Signed)
-----   Message from [name], NP sent at 08/09/2018  4:36 PM EDT ----- Please notify patient:  Electrolytes are slightly off including sodium and potassium. Unsure why, but this will need repeating. Blood sugar is in the prediabetic range so this could account for the abnormal electrolytes. Needs to work on a healthy diet and start with regular exercise.  Thyroid function is normal. Blood counts are stable, no anemia. Cholesterol looks good. B12 is normal but on the lower end of normal. I would start with oral B12 1000 mcg once daily.   Repeat BMP needed in one week, please schedule.

## 2018-08-16 ENCOUNTER — Other Ambulatory Visit (INDEPENDENT_AMBULATORY_CARE_PROVIDER_SITE_OTHER): Payer: PPO

## 2018-08-16 DIAGNOSIS — E871 Hypo-osmolality and hyponatremia: Secondary | ICD-10-CM | POA: Diagnosis not present

## 2018-08-16 LAB — BASIC METABOLIC PANEL
BUN: 10 mg/dL (ref 6–23)
CO2: 31 mEq/L (ref 19–32)
Calcium: 9.1 mg/dL (ref 8.4–10.5)
Chloride: 92 mEq/L — ABNORMAL LOW (ref 96–112)
Creatinine, Ser: 0.71 mg/dL (ref 0.40–1.20)
GFR: 86.28 mL/min (ref >60.00)
Glucose, Bld: 122 mg/dL — ABNORMAL HIGH (ref 70–99)
Potassium: 3.6 mEq/L (ref 3.5–5.1)
Sodium: 129 mEq/L — ABNORMAL LOW (ref 135–145)

## 2018-08-19 ENCOUNTER — Telehealth: Payer: Self-pay | Admitting: Primary Care

## 2018-08-19 ENCOUNTER — Other Ambulatory Visit: Payer: Self-pay | Admitting: Primary Care

## 2018-08-19 DIAGNOSIS — I10 Essential (primary) hypertension: Secondary | ICD-10-CM

## 2018-08-19 MED ORDER — LISINOPRIL 20 MG PO TABS: 20.0000 mg | ORAL_TABLET | Freq: Every day | ORAL | 0 refills | Status: DC

## 2018-08-19 NOTE — Telephone Encounter (Signed)
Patient returned Chan's call about her lab results.  Please call patient back.

## 2018-08-19 NOTE — Telephone Encounter (Signed)
Spoken and notified patient of Kate Clark's comments. Patient verbalized understanding.  

## 2018-08-23 ENCOUNTER — Other Ambulatory Visit (INDEPENDENT_AMBULATORY_CARE_PROVIDER_SITE_OTHER): Payer: PPO

## 2018-08-23 DIAGNOSIS — E871 Hypo-osmolality and hyponatremia: Secondary | ICD-10-CM | POA: Diagnosis not present

## 2018-08-23 LAB — BASIC METABOLIC PANEL
BUN: 9 mg/dL (ref 6–23)
CO2: 27 mEq/L (ref 19–32)
Calcium: 8.9 mg/dL (ref 8.4–10.5)
Chloride: 100 mEq/L (ref 96–112)
Creatinine, Ser: 0.72 mg/dL (ref 0.40–1.20)
GFR: 84.89 mL/min (ref >60.00)
Glucose, Bld: 116 mg/dL — ABNORMAL HIGH (ref 70–99)
Potassium: 3.6 mEq/L (ref 3.5–5.1)
Sodium: 135 mEq/L (ref 135–145)

## 2018-08-23 NOTE — Addendum Note (Signed)
Addended by: Ellamae Sia on: 08/23/2018 11:22 AM   Modules accepted: Orders

## 2018-08-30 DIAGNOSIS — R5383 Other fatigue: Secondary | ICD-10-CM | POA: Diagnosis not present

## 2018-08-30 DIAGNOSIS — M810 Age-related osteoporosis without current pathological fracture: Secondary | ICD-10-CM | POA: Diagnosis not present

## 2018-08-30 DIAGNOSIS — E559 Vitamin D deficiency, unspecified: Secondary | ICD-10-CM | POA: Diagnosis not present

## 2018-09-10 ENCOUNTER — Inpatient Hospital Stay: Payer: PPO | Attending: Oncology | Admitting: Oncology

## 2018-09-10 ENCOUNTER — Ambulatory Visit
Admission: RE | Admit: 2018-09-10 | Discharge: 2018-09-10 | Disposition: A | Discharge status: Home / Self Care | Payer: PPO | Source: Ambulatory Visit | Attending: Oncology | Admitting: Oncology

## 2018-09-10 DIAGNOSIS — Z87891 Personal history of nicotine dependence: Secondary | ICD-10-CM | POA: Diagnosis not present

## 2018-09-10 DIAGNOSIS — F17210 Nicotine dependence, cigarettes, uncomplicated: Secondary | ICD-10-CM | POA: Diagnosis not present

## 2018-09-10 DIAGNOSIS — Z122 Encounter for screening for malignant neoplasm of respiratory organs: Secondary | ICD-10-CM | POA: Diagnosis not present

## 2018-09-10 NOTE — Progress Notes (Signed)
In accordance with CMS guidelines, patient has met eligibility criteria including age, absence of signs or symptoms of lung cancer.  Social History   Tobacco Use  . Smoking status: Current Every Day Smoker    Packs/day: 1.00    Years: 56.00    Pack years: 56.00    Types: E-cigarettes, Cigarettes  . Smokeless tobacco: Never Used  Substance Use Topics  . Alcohol use: Yes    Alcohol/week: 0.0 standard drinks    Comment: 2 to 3 beer a day  . Drug use: No     A shared decision-making session was conducted prior to the performance of CT scan. This includes one or more decision aids, includes benefits and harms of screening, follow-up diagnostic testing, over-diagnosis, false positive rate, and total radiation exposure.  Counseling on the importance of adherence to annual lung cancer LDCT screening, impact of co-morbidities, and ability or willingness to undergo diagnosis and treatment is imperative for compliance of the program.  Counseling on the importance of continued smoking cessation for former smokers; the importance of smoking cessation for current smokers, and information about tobacco cessation interventions have been given to patient including [place] and [facility] quit [place] programs.  Written order for lung cancer screening with LDCT has been given to the patient and any and all questions have been answered to the best of my abilities.   Yearly follow up will be coordinated by Burgess Estelle, Thoracic Navigator.  Faythe Casa, NP 09/10/2018 2:21 PM

## 2018-09-11 DIAGNOSIS — M810 Age-related osteoporosis without current pathological fracture: Secondary | ICD-10-CM | POA: Diagnosis not present

## 2018-09-11 DIAGNOSIS — E559 Vitamin D deficiency, unspecified: Secondary | ICD-10-CM | POA: Diagnosis not present

## 2018-09-13 ENCOUNTER — Other Ambulatory Visit: Payer: Self-pay | Admitting: *Deleted

## 2018-09-13 ENCOUNTER — Encounter: Payer: Self-pay | Admitting: *Deleted

## 2018-09-13 DIAGNOSIS — I10 Essential (primary) hypertension: Secondary | ICD-10-CM

## 2018-09-13 DIAGNOSIS — F419 Anxiety disorder, unspecified: Secondary | ICD-10-CM

## 2018-09-13 DIAGNOSIS — E785 Hyperlipidemia, unspecified: Secondary | ICD-10-CM

## 2018-09-13 DIAGNOSIS — F329 Major depressive disorder, single episode, unspecified: Secondary | ICD-10-CM

## 2018-09-13 DIAGNOSIS — F32A Depression, unspecified: Secondary | ICD-10-CM

## 2018-09-13 MED ORDER — METOPROLOL SUCCINATE ER 25 MG PO TB24: 25.0000 mg | ORAL_TABLET | Freq: Every day | ORAL | 1 refills | Status: DC

## 2018-09-13 MED ORDER — SIMVASTATIN 40 MG PO TABS: 40.0000 mg | ORAL_TABLET | Freq: Every evening | ORAL | 1 refills | Status: DC

## 2018-09-13 MED ORDER — VENLAFAXINE HCL ER 75 MG PO CP24: 75.0000 mg | ORAL_CAPSULE | Freq: Every day | ORAL | 1 refills | Status: DC

## 2018-11-10 ENCOUNTER — Other Ambulatory Visit: Payer: Self-pay | Admitting: Primary Care

## 2018-11-10 DIAGNOSIS — I10 Essential (primary) hypertension: Secondary | ICD-10-CM

## 2018-11-12 ENCOUNTER — Ambulatory Visit (INDEPENDENT_AMBULATORY_CARE_PROVIDER_SITE_OTHER): Payer: PPO | Admitting: Internal Medicine

## 2018-11-12 ENCOUNTER — Encounter: Payer: Self-pay | Admitting: Internal Medicine

## 2018-11-12 VITALS — BP 200/110 | HR 75 | Temp 97.8°F | Wt 134.0 lb

## 2018-11-12 DIAGNOSIS — I10 Essential (primary) hypertension: Secondary | ICD-10-CM | POA: Diagnosis not present

## 2018-11-12 LAB — CBC
HCT: 38.3 % (ref 36.0–46.0)
Hemoglobin: 12.8 g/dL (ref 12.0–15.0)
MCHC: 33.4 g/dL (ref 30.0–36.0)
MCV: 97.3 fl (ref 78.0–100.0)
Platelets: 350 10*3/uL (ref 150.0–400.0)
RBC: 3.94 Mil/uL (ref 3.87–5.11)
RDW: 12.2 % (ref 11.5–15.5)
WBC: 8.8 10*3/uL (ref 4.0–10.5)

## 2018-11-12 LAB — COMPREHENSIVE METABOLIC PANEL
ALT: 16 U/L (ref 0–35)
AST: 21 U/L (ref 0–37)
Albumin: 4.4 g/dL (ref 3.5–5.2)
Alkaline Phosphatase: 52 U/L (ref 39–117)
BUN: 8 mg/dL (ref 6–23)
CO2: 29 mEq/L (ref 19–32)
Calcium: 9.3 mg/dL (ref 8.4–10.5)
Chloride: 99 mEq/L (ref 96–112)
Creatinine, Ser: 0.62 mg/dL (ref 0.40–1.20)
GFR: 94.86 mL/min (ref >60.00)
Glucose, Bld: 117 mg/dL — ABNORMAL HIGH (ref 70–99)
Potassium: 3.9 mEq/L (ref 3.5–5.1)
Sodium: 136 mEq/L (ref 135–145)
Total Bilirubin: 0.4 mg/dL (ref 0.2–1.2)
Total Protein: 6.9 g/dL (ref 6.0–8.3)

## 2018-11-13 ENCOUNTER — Encounter: Payer: Self-pay | Admitting: Internal Medicine

## 2018-11-13 NOTE — Patient Instructions (Signed)

## 2018-11-13 NOTE — Progress Notes (Signed)
Subjective:    [name], female    DOB: 1946-09-01, 73 y.o.   MRN: [id number]  HPI  Patient presents to the clinic today with complaint of elevated blood pressure.  She has noticed that she has had headaches over the last 2 weeks.  She felt like this was related to stress because she has been staying with a friend who recently had surgery.  The headache is located in the back of her head extending up to her forehead.  She describes the pain as pressure.  She reports some associated lightheadedness but denies visual changes or dizziness.  She reports she has been checking her blood pressure with her friend's blood pressure cuff and it has been running in the high 180s.  Her blood pressure this morning at [facility] was 215/104.  She is currently taking Lisinopril 20 mg daily.  She reports she was on HCTZ in the past but it was stopped due to low potassium.  She recently stopped smoking.  Her BP in the clinic is 200/110.  There is no ECG on file.  Review of Systems      Past Medical History:  Diagnosis Date  . COPD (chronic obstructive pulmonary disease) ([place])   . GERD (gastroesophageal reflux disease)   . Hyperlipidemia   . Hypertension   . Seasonal allergies     Current Outpatient Medications  Medication Sig Dispense Refill  . albuterol (PROAIR HFA) 108 (90 Base) MCG/ACT inhaler Inhale 2 puffs into the lungs every 4 (four) hours as needed for wheezing or shortness of breath. 1 Inhaler 0  . Cholecalciferol (D-3-5) 5000 UNITS capsule Take 5,000 Units by mouth daily.    Marland Kitchen ibuprofen (ADVIL,MOTRIN) 200 MG tablet Take 400-600 mg by mouth every 8 (eight) hours as needed for mild pain or moderate pain.    Marland Kitchen lisinopril (PRINIVIL,ZESTRIL) 20 MG tablet TAKE 1 TABLET (20 MG TOTAL) BY MOUTH DAILY. FOR BLOOD PRESSURE. 90 tablet 1  . metoprolol succinate (TOPROL-XL) 25 MG 24 hr tablet Take 1 tablet (25 mg total) by mouth daily. For blood pressure. 90 tablet 1  . Multiple Vitamins-Minerals  (MULTIVITAMIN GUMMIES ADULTS) CHEW Chew 2 each by mouth daily.    Marland Kitchen omeprazole (PRILOSEC) 20 MG capsule Take 1 capsule (20 mg total) by mouth daily. For heartburn. 90 capsule 3  . polyethylene glycol powder (GLYCOLAX/MIRALAX) powder Mix 17 g into water once to twice daily as needed for constipation. 3350 g 0  . simvastatin (ZOCOR) 40 MG tablet Take 1 tablet (40 mg total) by mouth every evening. For cholesterol. 90 tablet 1  . venlafaxine XR (EFFEXOR-XR) 75 MG 24 hr capsule Take 1 capsule (75 mg total) by mouth daily with breakfast. For anxiety/depression. 90 capsule 1  . vitamin C (ASCORBIC ACID) 500 MG tablet Take 500 mg by mouth daily.    Marland Kitchen aspirin EC 81 MG tablet Take 81 mg by mouth daily.     No current facility-administered medications for this visit.     Allergies  Allergen Reactions  . Hctz [Hydrochlorothiazide] Other (See Comments)    Hyponatremia   . Azithromycin Nausea And Vomiting  . Codeine Itching    Makes me crazy    Family History  Problem Relation Age of Onset  . Heart disease Father        before age 32    Social History   Socioeconomic History  . Marital status: Married    Spouse name: Not on file  . Number  of children: Not on file  . Years of education: Not on file  . Highest education level: Not on file  Occupational History  . Not on file  Social Needs  . Financial resource strain: Not on file  . Food insecurity:    Worry: Not on file    Inability: Not on file  . Transportation needs:    Medical: Not on file    Non-medical: Not on file  Tobacco Use  . Smoking status: Current Every Day Smoker    Packs/day: 1.00    Years: 56.00    Pack years: 56.00    Types: E-cigarettes  . Smokeless tobacco: Never Used  . Tobacco comment: stopped cigs Dec 2019  Substance and Sexual Activity  . Alcohol use: Yes    Alcohol/week: 0.0 standard drinks    Comment: 2 to 3 beer a day  . Drug use: No  . Sexual activity: Not on file  Lifestyle  . Physical activity:     Days per week: Not on file    Minutes per session: Not on file  . Stress: Not on file  Relationships  . Social connections:    Talks on phone: Not on file    Gets together: Not on file    Attends religious service: Not on file    Active member of club or organization: Not on file    Attends meetings of clubs or organizations: Not on file    Relationship status: Not on file  . Intimate partner violence:    Fear of current or ex partner: Not on file    Emotionally abused: Not on file    Physically abused: Not on file    Forced sexual activity: Not on file  Other Topics Concern  . Not on file  Social History Narrative   Married.   2 children, 3 grandchildren.   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   Enjoys spending time with her family, going to the beach and movies.      Constitutional: Patient reports headache.  Denies fever, malaise, fatigue, or abrupt weight changes.  HEENT: Denies eye pain, eye redness, ear pain, ringing in the ears, wax buildup, runny nose, nasal congestion, bloody nose, or sore throat. Respiratory: Denies difficulty breathing, shortness of breath, cough or sputum production.   Cardiovascular: Denies chest pain, chest tightness, palpitations or swelling in the hands or feet.  Musculoskeletal: Denies decrease in range of motion, difficulty with gait, muscle pain or joint pain and swelling.  Neurological: Denies dizziness, difficulty with memory, difficulty with speech or problems with balance and coordination.    No other specific complaints in a complete review of systems (except as listed in HPI above).  Objective:   Physical Exam    BP (!) 200/110   Pulse 75   Temp 97.8 F (36.6 C) (Oral)   Wt 134 lb (60.8 kg)   SpO2 97%   BMI 25.32 kg/m  Wt Readings from Last 3 Encounters:  11/12/18 134 lb (60.8 kg)  09/10/18 138 lb (62.6 kg)  08/09/18 141 lb 8 oz (64.2 kg)    General: Appears her stated age, well developed, well nourished in  NAD. HEENT: Head: normal shape and size; Eyes: sclera white, no icterus, conjunctiva pink, PERRLA and EOMs intact;  Cardiovascular: Normal rate and rhythm. S1,S2 noted.  No murmur, rubs or gallops noted. No JVD or BLE edema.  Pulmonary/Chest: Normal effort and positive vesicular breath sounds. No respiratory distress. No wheezes, rales or  ronchi noted.  Musculoskeletal: Normal flexion, extension and rotation of the cervical spine.  No bony tenderness noted over the cervical spine. Neurological: Alert and oriented.  Coordination normal.    BMET    Component Value Date/Time   NA 136 11/12/2018 1209   K 3.9 11/12/2018 1209   CL 99 11/12/2018 1209   CO2 29 11/12/2018 1209   GLUCOSE 117 (H) 11/12/2018 1209   BUN 8 11/12/2018 1209   CREATININE 0.62 11/12/2018 1209   CALCIUM 9.3 11/12/2018 1209    Lipid Panel     Component Value Date/Time   CHOL 183 08/09/2018 1218   TRIG 100.0 08/09/2018 1218   HDL 94.00 08/09/2018 1218   CHOLHDL 2 08/09/2018 1218   VLDL 20.0 08/09/2018 1218   LDLCALC 69 08/09/2018 1218    CBC    Component Value Date/Time   WBC 8.8 11/12/2018 1209   RBC 3.94 11/12/2018 1209   HGB 12.8 11/12/2018 1209   HCT 38.3 11/12/2018 1209   PLT 350.0 11/12/2018 1209   MCV 97.3 11/12/2018 1209   MCHC 33.4 11/12/2018 1209   RDW 12.2 11/12/2018 1209   LYMPHSABS 3.3 09/13/2015 1544   MONOABS 0.8 09/13/2015 1544   EOSABS 0.1 09/13/2015 1544   BASOSABS 0.1 09/13/2015 1544    Hgb A1C Lab Results  Component Value Date   HGBA1C 6.2 08/09/2018          Assessment & Plan:   HTN, Uncontrolled:  Increase Lisinopril to 40 mg p.o. daily Discussed DASH diet and increase in physical activity Monitor BP once daily, at least 2 hours after taking medication CBC and CMP today  Follow-up with PCP on Monday for recheck of hypertension Webb Silversmith, NP

## 2018-11-18 ENCOUNTER — Ambulatory Visit (INDEPENDENT_AMBULATORY_CARE_PROVIDER_SITE_OTHER): Payer: PPO | Admitting: Primary Care

## 2018-11-18 ENCOUNTER — Encounter: Payer: Self-pay | Admitting: Primary Care

## 2018-11-18 VITALS — BP 162/98 | HR 78 | Temp 98.3°F | Ht 61.0 in | Wt 135.5 lb

## 2018-11-18 DIAGNOSIS — I700 Atherosclerosis of aorta: Secondary | ICD-10-CM | POA: Diagnosis not present

## 2018-11-18 DIAGNOSIS — I10 Essential (primary) hypertension: Secondary | ICD-10-CM

## 2018-11-18 LAB — BASIC METABOLIC PANEL
BUN: 9 mg/dL (ref 6–23)
CO2: 30 mEq/L (ref 19–32)
Calcium: 9.2 mg/dL (ref 8.4–10.5)
Chloride: 99 mEq/L (ref 96–112)
Creatinine, Ser: 0.68 mg/dL (ref 0.40–1.20)
GFR: 85.26 mL/min (ref >60.00)
Glucose, Bld: 109 mg/dL — ABNORMAL HIGH (ref 70–99)
Potassium: 4.1 mEq/L (ref 3.5–5.1)
Sodium: 137 mEq/L (ref 135–145)

## 2018-11-18 MED ORDER — AMLODIPINE BESYLATE 5 MG PO TABS: 5.0000 mg | ORAL_TABLET | Freq: Every day | ORAL | 0 refills | Status: DC

## 2018-11-18 MED ORDER — LISINOPRIL 20 MG PO TABS: 40.0000 mg | ORAL_TABLET | Freq: Every day | ORAL | 0 refills | Status: DC

## 2018-11-18 NOTE — Assessment & Plan Note (Signed)
Uncontrolled. Evaluated one week ago, lisinopril increased to 40 mg.  BP improved but still above goal. Continue lisinopril 40 mg, check BMP. Continue metoprolol succinate 25 mg. Add Amlodipine 5 mg. Continue to monitor BP and follow up in 2-3 weeks for BP check.

## 2018-11-18 NOTE — Progress Notes (Signed)
Subjective:    [name], female    DOB: September 21, 1946, 73 y.o.   MRN: [id number]  HPI  Morgan [name] is a 73 year old female who presents today for follow up of hypertension.   She was last evaluated on 11/12/18 with complaints of elevated blood pressure readings, headaches, increased stress. Her BP was running 270'W systolic. She did check her BP at [facility] which was 215/104. BP was 200/110 during that visit. She endorsed compliance to Lisinopril 20 mg and metoprolol succinate 25 mg. During her last visit her lisinopril was increased to 40 mg. She was encouraged to work on a [place] and follow up with PCP today.  Since her last visit she's been checking her BP at home which is running 150-180's/80's-90's. Her headaches have improved but she's still feeling "brain fog". She denies changes in speech, unilateral weakness, dizziness, abdominal pain, back pain. She does endorse daily worry and long history of anxiety. She was following with Vascular, Dr. Bridgett Larsson and undergoing evaluation for abdominal atherosclerosis, last visit in 2019. She will be due for repeat imaging in 2021.   BP Readings from Last 3 Encounters:  11/18/18 (!) 162/98  11/12/18 (!) 200/110  08/09/18 140/80     Review of Systems  Constitutional: Negative for fatigue.  Respiratory: Negative for shortness of breath.   Cardiovascular: Negative for chest pain.  Neurological: Negative for dizziness and headaches.       Past Medical History:  Diagnosis Date  . COPD (chronic obstructive pulmonary disease) ([place])   . GERD (gastroesophageal reflux disease)   . Hyperlipidemia   . Hypertension   . Seasonal allergies      Social History   Socioeconomic History  . Marital status: Married    Spouse name: Not on file  . Number of children: Not on file  . Years of education: Not on file  . Highest education level: Not on file  Occupational History  . Not on file  Social Needs  . Financial resource strain: Not on  file  . Food insecurity:    Worry: Not on file    Inability: Not on file  . Transportation needs:    Medical: Not on file    Non-medical: Not on file  Tobacco Use  . Smoking status: Current Every Day Smoker    Packs/day: 1.00    Years: 56.00    Pack years: 56.00    Types: E-cigarettes  . Smokeless tobacco: Never Used  . Tobacco comment: stopped cigs Dec 2019  Substance and Sexual Activity  . Alcohol use: Yes    Alcohol/week: 0.0 standard drinks    Comment: 2 to 3 beer a day  . Drug use: No  . Sexual activity: Not on file  Lifestyle  . Physical activity:    Days per week: Not on file    Minutes per session: Not on file  . Stress: Not on file  Relationships  . Social connections:    Talks on phone: Not on file    Gets together: Not on file    Attends religious service: Not on file    Active member of club or organization: Not on file    Attends meetings of clubs or organizations: Not on file    Relationship status: Not on file  . Intimate partner violence:    Fear of current or ex partner: Not on file    Emotionally abused: Not on file    Physically abused: Not on  file    Forced sexual activity: Not on file  Other Topics Concern  . Not on file  Social History Narrative   Married.   2 children, 3 grandchildren.   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   Enjoys spending time with her family, going to the beach and movies.     Past Surgical History:  Procedure Laterality Date  . BACK SURGERY      Family History  Problem Relation Age of Onset  . Heart disease Father        before age 72    Allergies  Allergen Reactions  . Hctz [Hydrochlorothiazide] Other (See Comments)    Hyponatremia   . Azithromycin Nausea And Vomiting  . Codeine Itching    Makes me crazy    Current Outpatient Medications on File Prior to Visit  Medication Sig Dispense Refill  . albuterol (PROAIR HFA) 108 (90 Base) MCG/ACT inhaler Inhale 2 puffs into the lungs every 4 (four)  hours as needed for wheezing or shortness of breath. 1 Inhaler 0  . aspirin EC 81 MG tablet Take 81 mg by mouth daily.    . Cholecalciferol (D-3-5) 5000 UNITS capsule Take 5,000 Units by mouth daily.    Marland Kitchen ibuprofen (ADVIL,MOTRIN) 200 MG tablet Take 400-600 mg by mouth every 8 (eight) hours as needed for mild pain or moderate pain.    Marland Kitchen lisinopril (PRINIVIL,ZESTRIL) 20 MG tablet TAKE 1 TABLET (20 MG TOTAL) BY MOUTH DAILY. FOR BLOOD PRESSURE. (Patient taking differently: Take 40 mg by mouth daily. For blood pressure.) 90 tablet 1  . metoprolol succinate (TOPROL-XL) 25 MG 24 hr tablet Take 1 tablet (25 mg total) by mouth daily. For blood pressure. 90 tablet 1  . Multiple Vitamins-Minerals (MULTIVITAMIN GUMMIES ADULTS) CHEW Chew 2 each by mouth daily.    Marland Kitchen omeprazole (PRILOSEC) 20 MG capsule Take 1 capsule (20 mg total) by mouth daily. For heartburn. 90 capsule 3  . polyethylene glycol powder (GLYCOLAX/MIRALAX) powder Mix 17 g into water once to twice daily as needed for constipation. 3350 g 0  . simvastatin (ZOCOR) 40 MG tablet Take 1 tablet (40 mg total) by mouth every evening. For cholesterol. 90 tablet 1  . venlafaxine XR (EFFEXOR-XR) 75 MG 24 hr capsule Take 1 capsule (75 mg total) by mouth daily with breakfast. For anxiety/depression. 90 capsule 1  . vitamin C (ASCORBIC ACID) 500 MG tablet Take 500 mg by mouth daily.     No current facility-administered medications on file prior to visit.     BP (!) 162/98   Pulse 78   Temp 98.3 F (36.8 C) (Oral)   Ht 5\' 1"  (1.549 m)   Wt 135 lb 8 oz (61.5 kg)   SpO2 98%   BMI 25.60 kg/m    Objective:   Physical Exam  Constitutional: She appears well-nourished.  Neck: Neck supple.  Cardiovascular: Normal rate and regular rhythm.  Respiratory: Effort normal and breath sounds normal.  Skin: Skin is warm and dry.           Assessment & Plan:

## 2018-11-18 NOTE — Assessment & Plan Note (Signed)
Following with vascular surgery, due for repeat imaging in 2021. Asymptomatic.

## 2018-11-18 NOTE — Patient Instructions (Signed)
Start amlodipine 5 mg for blood pressure.  Continue taking lisinopril 40 mg daily for blood pressure and metoprolol succinate 25 mg daily for blood pressure.  Stop by the lab prior to leaving today. I will notify you of your results once received.   Continue to monitor your blood pressure daily.  Schedule a follow up visit in 2-3 weeks for BP check.  It was a pleasure to see you today!

## 2018-11-28 ENCOUNTER — Other Ambulatory Visit: Payer: Self-pay | Admitting: Primary Care

## 2018-11-28 DIAGNOSIS — F32A Depression, unspecified: Secondary | ICD-10-CM

## 2018-11-28 DIAGNOSIS — F419 Anxiety disorder, unspecified: Principal | ICD-10-CM

## 2018-11-28 DIAGNOSIS — F329 Major depressive disorder, single episode, unspecified: Secondary | ICD-10-CM

## 2018-11-28 MED ORDER — VENLAFAXINE HCL ER 75 MG PO CP24: 75.0000 mg | ORAL_CAPSULE | Freq: Every day | ORAL | 1 refills | Status: DC

## 2018-12-02 ENCOUNTER — Encounter: Payer: Self-pay | Admitting: Primary Care

## 2018-12-02 ENCOUNTER — Ambulatory Visit (INDEPENDENT_AMBULATORY_CARE_PROVIDER_SITE_OTHER): Payer: PPO | Admitting: Primary Care

## 2018-12-02 VITALS — BP 150/82 | HR 77 | Temp 98.3°F | Ht 61.0 in | Wt 135.2 lb

## 2018-12-02 DIAGNOSIS — I10 Essential (primary) hypertension: Secondary | ICD-10-CM

## 2018-12-02 MED ORDER — AMLODIPINE BESYLATE 10 MG PO TABS: 10.0000 mg | ORAL_TABLET | Freq: Every day | ORAL | 0 refills | Status: DC

## 2018-12-02 NOTE — Progress Notes (Signed)
Subjective:    [name], female    DOB: 1946/08/02, 73 y.o.   MRN: [id number]  HPI  Morgan [name] is a 73 year old female who presents today for follow up of hypertension.   She was last evaluated on 11/18/18 for BP follow up, blood pressure was noted to be uncontrolled despite increase in her lisinopril to 40 mg. We decided to continue her lisinopril 40 mg and metoprolol succinate 25 mg and add in Amlodipine 5 mg.   Since her last visit she's noticed some ankle edema, right worse than left. Overall the edema is tolerable and doesn't cause her discomfort. She is not elevating her lower extremities at night. She's checking BP at home which is running 140's-150's/70's-80's. She denies dizziness. Headaches have improved.   BP Readings from Last 3 Encounters:  12/02/18 (!) 150/82  11/18/18 (!) 162/98  11/12/18 (!) 200/110     Review of Systems  Eyes: Negative for visual disturbance.  Respiratory: Negative for shortness of breath.   Cardiovascular: Positive for leg swelling. Negative for chest pain.  Neurological: Positive for headaches. Negative for dizziness.       Past Medical History:  Diagnosis Date  . COPD (chronic obstructive pulmonary disease) ([place])   . GERD (gastroesophageal reflux disease)   . Hyperlipidemia   . Hypertension   . Seasonal allergies      Social History   Socioeconomic History  . Marital status: Married    Spouse name: Not on file  . Number of children: Not on file  . Years of education: Not on file  . Highest education level: Not on file  Occupational History  . Not on file  Social Needs  . Financial resource strain: Not on file  . Food insecurity:    Worry: Not on file    Inability: Not on file  . Transportation needs:    Medical: Not on file    Non-medical: Not on file  Tobacco Use  . Smoking status: Current Every Day Smoker    Packs/day: 1.00    Years: 56.00    Pack years: 56.00    Types: E-cigarettes  . Smokeless  tobacco: Never Used  . Tobacco comment: stopped cigs Dec 2019  Substance and Sexual Activity  . Alcohol use: Yes    Alcohol/week: 0.0 standard drinks    Comment: 2 to 3 beer a day  . Drug use: No  . Sexual activity: Not on file  Lifestyle  . Physical activity:    Days per week: Not on file    Minutes per session: Not on file  . Stress: Not on file  Relationships  . Social connections:    Talks on phone: Not on file    Gets together: Not on file    Attends religious service: Not on file    Active member of club or organization: Not on file    Attends meetings of clubs or organizations: Not on file    Relationship status: Not on file  . Intimate partner violence:    Fear of current or ex partner: Not on file    Emotionally abused: Not on file    Physically abused: Not on file    Forced sexual activity: Not on file  Other Topics Concern  . Not on file  Social History Narrative   Married.   2 children, 3 grandchildren.   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   Enjoys spending time with her family, going  to the beach and movies.     Past Surgical History:  Procedure Laterality Date  . BACK SURGERY      Family History  Problem Relation Age of Onset  . Heart disease Father        before age 79    Allergies  Allergen Reactions  . Hctz [Hydrochlorothiazide] Other (See Comments)    Hyponatremia   . Azithromycin Nausea And Vomiting  . Codeine Itching    Makes me crazy    Current Outpatient Medications on File Prior to Visit  Medication Sig Dispense Refill  . albuterol (PROAIR HFA) 108 (90 Base) MCG/ACT inhaler Inhale 2 puffs into the lungs every 4 (four) hours as needed for wheezing or shortness of breath. 1 Inhaler 0  . aspirin EC 81 MG tablet Take 81 mg by mouth daily.    . Cholecalciferol (D-3-5) 5000 UNITS capsule Take 5,000 Units by mouth daily.    Marland Kitchen ibuprofen (ADVIL,MOTRIN) 200 MG tablet Take 400-600 mg by mouth every 8 (eight) hours as needed for mild  pain or moderate pain.    Marland Kitchen lisinopril (PRINIVIL,ZESTRIL) 20 MG tablet Take 2 tablets (40 mg total) by mouth daily. For blood pressure. 30 tablet 0  . metoprolol succinate (TOPROL-XL) 25 MG 24 hr tablet Take 1 tablet (25 mg total) by mouth daily. For blood pressure. 90 tablet 1  . Multiple Vitamins-Minerals (MULTIVITAMIN GUMMIES ADULTS) CHEW Chew 2 each by mouth daily.    Marland Kitchen omeprazole (PRILOSEC) 20 MG capsule Take 1 capsule (20 mg total) by mouth daily. For heartburn. 90 capsule 3  . polyethylene glycol powder (GLYCOLAX/MIRALAX) powder Mix 17 g into water once to twice daily as needed for constipation. 3350 g 0  . simvastatin (ZOCOR) 40 MG tablet Take 1 tablet (40 mg total) by mouth every evening. For cholesterol. 90 tablet 1  . venlafaxine XR (EFFEXOR-XR) 75 MG 24 hr capsule Take 1 capsule (75 mg total) by mouth daily with breakfast. For anxiety/depression. 90 capsule 1  . vitamin C (ASCORBIC ACID) 500 MG tablet Take 500 mg by mouth daily.     No current facility-administered medications on file prior to visit.     BP (!) 150/82   Pulse 77   Temp 98.3 F (36.8 C) (Oral)   Ht 5\' 1"  (1.549 m)   Wt 135 lb 4 oz (61.3 kg)   SpO2 98%   BMI 25.56 kg/m    Objective:   Physical Exam  Constitutional: She appears well-nourished.  Neck: Neck supple.  Cardiovascular: Normal rate and regular rhythm.  Mild ankle edema to bilateral lower extremities, right more than left.   Respiratory: Effort normal and breath sounds normal.  Skin: Skin is warm and dry.           Assessment & Plan:

## 2018-12-02 NOTE — Patient Instructions (Signed)
Continue lisinopril 40 mg daily and metoprolol succinate 25 mg daily. We've increased the Amlodipine to 10 mg, I sent a new prescription to your pharmacy.  Elevate your legs and be sure to stay hydrated with water.  Please call me with some BP readings in 2 weeks as discussed. Call me if your swelling becomes worse/intolerable.  It was a pleasure to see you today!

## 2018-12-02 NOTE — Assessment & Plan Note (Signed)
Improved with addition of Amlodipine 5 mg, not at goal. Continue lisinopril 40 mg and metoprolol succinate 25 mg. Increase Amlodipine to 10 mg. She will monitor BP at home and report readings in 2 weeks. She will also update if edema becomes worse/intolerable. If unable to tolerate then consider hydralazine. Could consider increase in metoprolol succinate to 50 mg but has had borderline lower heart rate on prior visits.

## 2018-12-13 DIAGNOSIS — H903 Sensorineural hearing loss, bilateral: Secondary | ICD-10-CM | POA: Diagnosis not present

## 2018-12-16 ENCOUNTER — Ambulatory Visit (INDEPENDENT_AMBULATORY_CARE_PROVIDER_SITE_OTHER): Payer: PPO | Admitting: Primary Care

## 2018-12-16 ENCOUNTER — Telehealth: Payer: Self-pay | Admitting: Primary Care

## 2018-12-16 ENCOUNTER — Encounter: Payer: Self-pay | Admitting: Primary Care

## 2018-12-16 VITALS — BP 144/84 | HR 73 | Temp 98.0°F | Ht 61.0 in | Wt 137.2 lb

## 2018-12-16 DIAGNOSIS — R1900 Intra-abdominal and pelvic swelling, mass and lump, unspecified site: Secondary | ICD-10-CM | POA: Diagnosis not present

## 2018-12-16 DIAGNOSIS — I10 Essential (primary) hypertension: Secondary | ICD-10-CM

## 2018-12-16 HISTORY — DX: Intra-abdominal and pelvic swelling, mass and lump, unspecified site: R19.00

## 2018-12-16 MED ORDER — LOSARTAN POTASSIUM 100 MG PO TABS: 100.0000 mg | ORAL_TABLET | Freq: Every day | ORAL | 0 refills | Status: DC

## 2018-12-16 MED ORDER — HYDRALAZINE HCL 25 MG PO TABS: 25.0000 mg | ORAL_TABLET | Freq: Two times a day (BID) | ORAL | 0 refills | Status: DC

## 2018-12-16 NOTE — Progress Notes (Signed)
Subjective:    [name], female    DOB: January 16, 1946, 73 y.o.   MRN: [id number]  HPI  Morgan [name] is a 73 year old female who presents today for follow up of hypertension and a chief complaint of abdominal bulge.  She was last evaluated on 12/02/18 for hypertension. Her BP had improved on Amlodipine 5 mg, lisinopril 40 mg, and metoprolol succinate 25 mg but was not at goal. During her last visit amlodipine was increased to 10 mg. She was once managed on HCTZ but this caused hyponatremia.   Since her last visit she's noticed an increase in lower extremity/ankle edema. She's checking her BP at home which is running 130's-150's/70's-80's.   BP Readings from Last 3 Encounters:  12/16/18 (!) 144/84  12/02/18 (!) 150/82  11/18/18 (!) 162/98   She also repots a "lump" to the left lower abdomen for which she noticed one month ago. Over the last one week she's noticed that the lump has enlarged. She denies vaginal discharge, bleeding  Review of Systems  Eyes: Negative for visual disturbance.  Respiratory: Negative for shortness of breath.   Cardiovascular: Positive for leg swelling. Negative for chest pain.  Neurological: Negative for dizziness and headaches.       Past Medical History:  Diagnosis Date  . COPD (chronic obstructive pulmonary disease) ([place])   . GERD (gastroesophageal reflux disease)   . Hyperlipidemia   . Hypertension   . Seasonal allergies      Social History   Socioeconomic History  . Marital status: Married    Spouse name: Not on file  . Number of children: Not on file  . Years of education: Not on file  . Highest education level: Not on file  Occupational History  . Not on file  Social Needs  . Financial resource strain: Not on file  . Food insecurity:    Worry: Not on file    Inability: Not on file  . Transportation needs:    Medical: Not on file    Non-medical: Not on file  Tobacco Use  . Smoking status: Current Every Day Smoker   Packs/day: 1.00    Years: 56.00    Pack years: 56.00    Types: E-cigarettes  . Smokeless tobacco: Never Used  . Tobacco comment: stopped cigs Dec 2019  Substance and Sexual Activity  . Alcohol use: Yes    Alcohol/week: 0.0 standard drinks    Comment: 2 to 3 beer a day  . Drug use: No  . Sexual activity: Not on file  Lifestyle  . Physical activity:    Days per week: Not on file    Minutes per session: Not on file  . Stress: Not on file  Relationships  . Social connections:    Talks on phone: Not on file    Gets together: Not on file    Attends religious service: Not on file    Active member of club or organization: Not on file    Attends meetings of clubs or organizations: Not on file    Relationship status: Not on file  . Intimate partner violence:    Fear of current or ex partner: Not on file    Emotionally abused: Not on file    Physically abused: Not on file    Forced sexual activity: Not on file  Other Topics Concern  . Not on file  Social History Narrative   Married.   2 children, 3 grandchildren.  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   Enjoys spending time with her family, going to the beach and movies.     Past Surgical History:  Procedure Laterality Date  . BACK SURGERY      Family History  Problem Relation Age of Onset  . Heart disease Father        before age 39    Allergies  Allergen Reactions  . Hctz [Hydrochlorothiazide] Other (See Comments)    Hyponatremia   . Azithromycin Nausea And Vomiting  . Codeine Itching    Makes me crazy    Current Outpatient Medications on File Prior to Visit  Medication Sig Dispense Refill  . albuterol (PROAIR HFA) 108 (90 Base) MCG/ACT inhaler Inhale 2 puffs into the lungs every 4 (four) hours as needed for wheezing or shortness of breath. 1 Inhaler 0  . aspirin EC 81 MG tablet Take 81 mg by mouth daily.    . Cholecalciferol (D-3-5) 5000 UNITS capsule Take 5,000 Units by mouth daily.    Marland Kitchen ibuprofen  (ADVIL,MOTRIN) 200 MG tablet Take 400-600 mg by mouth every 8 (eight) hours as needed for mild pain or moderate pain.    . metoprolol succinate (TOPROL-XL) 25 MG 24 hr tablet Take 1 tablet (25 mg total) by mouth daily. For blood pressure. 90 tablet 1  . Multiple Vitamins-Minerals (MULTIVITAMIN GUMMIES ADULTS) CHEW Chew 2 each by mouth daily.    Marland Kitchen omeprazole (PRILOSEC) 20 MG capsule Take 1 capsule (20 mg total) by mouth daily. For heartburn. 90 capsule 3  . polyethylene glycol powder (GLYCOLAX/MIRALAX) powder Mix 17 g into water once to twice daily as needed for constipation. 3350 g 0  . simvastatin (ZOCOR) 40 MG tablet Take 1 tablet (40 mg total) by mouth every evening. For cholesterol. 90 tablet 1  . venlafaxine XR (EFFEXOR-XR) 75 MG 24 hr capsule Take 1 capsule (75 mg total) by mouth daily with breakfast. For anxiety/depression. 90 capsule 1  . vitamin C (ASCORBIC ACID) 500 MG tablet Take 500 mg by mouth daily.     No current facility-administered medications on file prior to visit.     BP (!) 144/84   Pulse 73   Temp 98 F (36.7 C) (Oral)   Ht 5\' 1"  (1.549 m)   Wt 137 lb 4 oz (62.3 kg)   SpO2 98%   BMI 25.93 kg/m    Objective:   Physical Exam  Constitutional: She appears well-nourished.  Neck: Neck supple.  Cardiovascular: Normal rate and regular rhythm.  Respiratory: Effort normal and breath sounds normal.  GI: Normal appearance and bowel sounds are normal. There is no abdominal tenderness.    Large abdominal bulge to bilateral lower abdomen. Most noticeable when standing, feels firm when standing. Softer feeling when supine. Non tender  Skin: Skin is warm and dry.  Psychiatric: She has a normal mood and affect.           Assessment & Plan:

## 2018-12-16 NOTE — Patient Instructions (Signed)
Stop lisinopril and amlodipine for blood pressure.  Continue metoprolol succinate 25 mg for blood pressure. Start losartan 100 mg for blood pressure. Take 1 tablet once daily. Start hydralazine 25 mg for blood pressure. Take 1 tablet twice daily.  You will be contacted regarding your ultrasound.  Please let us know if you have not been contacted within one week.   It was a pleasure to see you today!

## 2018-12-16 NOTE — Telephone Encounter (Signed)
Patient has appointment today 12/16/2018

## 2018-12-16 NOTE — Addendum Note (Signed)
Addended by: Jacqualin Combes on: 12/16/2018 12:47 PM   Modules accepted: Orders

## 2018-12-16 NOTE — Telephone Encounter (Addendum)
-----   Message from [name], NP sent at 12/02/2018 11:16 AM EST ----- Regarding: BP Will you please check on patient's BP readings since we increased her Amlodipine to 10 mg. How's her leg swelling?

## 2018-12-16 NOTE — Assessment & Plan Note (Addendum)
Unclear etiology. Could be hernia. Rule out other mass. Large and quite evident on exam. Abdominal ultrasound pending.

## 2018-12-16 NOTE — Assessment & Plan Note (Signed)
Improved on Amlodipine but cannot tolerate side effects of lower extremity edema. Cannot take HCTZ given hyponatremia. Cannot tolerate increase in metoprolol dose given lower HR.  Stop lisinopril. Start losartan. Start hydralazine BID.   We will plan to see her back in 2-3 weeks for BP check.

## 2018-12-17 ENCOUNTER — Ambulatory Visit
Admission: RE | Admit: 2018-12-17 | Discharge: 2018-12-17 | Disposition: A | Discharge status: Home / Self Care | Payer: PPO | Source: Ambulatory Visit | Attending: Primary Care | Admitting: Primary Care

## 2018-12-17 DIAGNOSIS — R1900 Intra-abdominal and pelvic swelling, mass and lump, unspecified site: Secondary | ICD-10-CM

## 2018-12-18 ENCOUNTER — Telehealth: Payer: Self-pay

## 2018-12-18 DIAGNOSIS — I10 Essential (primary) hypertension: Secondary | ICD-10-CM

## 2018-12-18 MED ORDER — LOSARTAN POTASSIUM 50 MG PO TABS: 100.0000 mg | ORAL_TABLET | Freq: Every day | ORAL | 1 refills | Status: DC

## 2018-12-18 NOTE — Telephone Encounter (Signed)
Pt left v/m that pharmacy is supposed to be contacting Gentry Fitz NP about inability to get losartan. Pt wants to know what to do and request cb.

## 2018-12-18 NOTE — Telephone Encounter (Signed)
Noted, will send losartan 50 mg tablets and have her take 2 to equal 100 mg.  Rx sent to pharmacy.

## 2018-12-18 NOTE — Telephone Encounter (Signed)
Per DPR, left detail message of Kate Clark's comments for patient. 

## 2018-12-19 ENCOUNTER — Telehealth: Payer: Self-pay

## 2018-12-19 NOTE — Telephone Encounter (Signed)
Patient is wondering about her ultrasound results. Please review. Also see other message about her b/p medication. Thank you

## 2018-12-19 NOTE — Telephone Encounter (Signed)
Patient called back and left a message stating that pharmacy advised patient all Losartan doses are D/C, unavailable now. Patient wanted to know what to do now. [phone].

## 2018-12-20 MED ORDER — IRBESARTAN 150 MG PO TABS: 150.0000 mg | ORAL_TABLET | Freq: Every day | ORAL | 0 refills | Status: DC

## 2018-12-20 NOTE — Telephone Encounter (Signed)
Please notify patient that we will send irbesartan 150 mg to her pharmacy.  Have her monitor her BP and notify us if she sees consistent readings at or above 135/90.

## 2018-12-20 NOTE — Telephone Encounter (Signed)
See telephone encounter.

## 2018-12-20 NOTE — Addendum Note (Signed)
Addended by: [name] on: 12/20/2018 07:16 AM   Modules accepted: Orders

## 2018-12-20 NOTE — Telephone Encounter (Signed)
See result note.  

## 2018-12-20 NOTE — Telephone Encounter (Signed)
Spoken and notified patient of Morgan [name]'s comments. Patient verbalized understanding.  

## 2018-12-23 DIAGNOSIS — H903 Sensorineural hearing loss, bilateral: Secondary | ICD-10-CM | POA: Diagnosis not present

## 2018-12-30 ENCOUNTER — Ambulatory Visit (INDEPENDENT_AMBULATORY_CARE_PROVIDER_SITE_OTHER): Payer: PPO | Admitting: Primary Care

## 2018-12-30 ENCOUNTER — Other Ambulatory Visit: Payer: Self-pay

## 2018-12-30 DIAGNOSIS — I10 Essential (primary) hypertension: Secondary | ICD-10-CM | POA: Diagnosis not present

## 2018-12-30 DIAGNOSIS — E785 Hyperlipidemia, unspecified: Secondary | ICD-10-CM

## 2018-12-30 MED ORDER — METOPROLOL SUCCINATE ER 25 MG PO TB24: 25.0000 mg | ORAL_TABLET | Freq: Every day | ORAL | 1 refills | Status: DC

## 2018-12-30 MED ORDER — SIMVASTATIN 40 MG PO TABS: 40.0000 mg | ORAL_TABLET | Freq: Every evening | ORAL | 1 refills | Status: DC

## 2018-12-30 MED ORDER — IRBESARTAN 300 MG PO TABS: 300.0000 mg | ORAL_TABLET | Freq: Every day | ORAL | 0 refills | Status: DC

## 2018-12-30 NOTE — Assessment & Plan Note (Signed)
Since last visit her losartan 100 mg was recalled, we switched to irbesartan 150 mg.  Blood pressure seems about the same, but given that we have now switched to irbesartan 150 mg we can increase to 300 mg and discontinue hydralazine.  Hydralazine is not favored in the elderly population given risks of falls, dizziness.  Continue metoprolol succinate.  She verbalized understanding.  We will call her in 2 weeks for blood pressure readings, she will continue to monitor.

## 2018-12-30 NOTE — Progress Notes (Signed)
Subjective:    [name], female    DOB: [birth year], 73 y.o.   MRN: [id number]  HPI  Morgan [name] is a 73 year old female who is being evaluated via phone today for follow up of hypertension. Patient consented to treatment via phone, she is at home, I am in the office.  She was last evaluated in the office on December 10, 2018 for follow-up of hypertension.  Blood pressure had improved on amlodipine 10 mg, losartan 100 mg, metoprolol 25 mg but was not at goal.  She also noted marked increase of lower extremity/ankle edema.  Blood pressure from home was running 140's-150's/70's-80's.  Given her complaints of intolerable ankle edema her amlodipine was discontinued.  She has a history of hypo-natremia on HCTZ so this was not an option.  She could not go up on metoprolol dose given near bradycardia.  Given those limitations we decided to start hydralazine 25 mg twice daily.  In between her office visit and now her losartan 100 mg tablets were recalled so she was switched to irbesartan 150 mg.  She is speaking with me via phone today given recent Covid-19 outbreak. She's checking her blood pressure at home and is getting readings of  130's-150's/80's, mostly 140's/80's.  Her ankle edema has decreased overall.  She denies dizziness, falls, weakness.  Phone visit lasted for 10 minutes.  Review of Systems  Eyes: Negative for visual disturbance.  Respiratory: Negative for shortness of breath.   Cardiovascular: Negative for chest pain.  Neurological: Negative for dizziness and headaches.       Past Medical History:  Diagnosis Date  . COPD (chronic obstructive pulmonary disease) ([place])   . GERD (gastroesophageal reflux disease)   . Hyperlipidemia   . Hypertension   . Seasonal allergies      Social History   Socioeconomic History  . Marital status: Married    Spouse name: Not on file  . Number of children: Not on file  . Years of education: Not on file  . Highest education  level: Not on file  Occupational History  . Not on file  Social Needs  . Financial resource strain: Not on file  . Food insecurity:    Worry: Not on file    Inability: Not on file  . Transportation needs:    Medical: Not on file    Non-medical: Not on file  Tobacco Use  . Smoking status: Current Every Day Smoker    Packs/day: 1.00    Years: 56.00    Pack years: 56.00    Types: E-cigarettes  . Smokeless tobacco: Never Used  . Tobacco comment: stopped cigs Dec 2019  Substance and Sexual Activity  . Alcohol use: Yes    Alcohol/week: 0.0 standard drinks    Comment: 2 to 3 beer a day  . Drug use: No  . Sexual activity: Not on file  Lifestyle  . Physical activity:    Days per week: Not on file    Minutes per session: Not on file  . Stress: Not on file  Relationships  . Social connections:    Talks on phone: Not on file    Gets together: Not on file    Attends religious service: Not on file    Active member of club or organization: Not on file    Attends meetings of clubs or organizations: Not on file    Relationship status: Not on file  . Intimate partner violence:  Fear of current or ex partner: Not on file    Emotionally abused: Not on file    Physically abused: Not on file    Forced sexual activity: Not on file  Other Topics Concern  . Not on file  Social History Narrative   Married.   2 children, 3 grandchildren.   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   Enjoys spending time with her family, going to the beach and movies.     Past Surgical History:  Procedure Laterality Date  . BACK SURGERY      Family History  Problem Relation Age of Onset  . Heart disease Father        before age 18    Allergies  Allergen Reactions  . Hctz [Hydrochlorothiazide] Other (See Comments)    Hyponatremia   . Azithromycin Nausea And Vomiting  . Codeine Itching    Makes me crazy    Current Outpatient Medications on File Prior to Visit  Medication Sig Dispense  Refill  . albuterol (PROAIR HFA) 108 (90 Base) MCG/ACT inhaler Inhale 2 puffs into the lungs every 4 (four) hours as needed for wheezing or shortness of breath. 1 Inhaler 0  . aspirin EC 81 MG tablet Take 81 mg by mouth daily.    . Cholecalciferol (D-3-5) 5000 UNITS capsule Take 5,000 Units by mouth daily.    . hydrALAZINE (APRESOLINE) 25 MG tablet Take 1 tablet (25 mg total) by mouth 2 (two) times daily. For blood pressure. 60 tablet 0  . ibuprofen (ADVIL,MOTRIN) 200 MG tablet Take 400-600 mg by mouth every 8 (eight) hours as needed for mild pain or moderate pain.    . Multiple Vitamins-Minerals (MULTIVITAMIN GUMMIES ADULTS) CHEW Chew 2 each by mouth daily.    Marland Kitchen omeprazole (PRILOSEC) 20 MG capsule Take 1 capsule (20 mg total) by mouth daily. For heartburn. 90 capsule 3  . polyethylene glycol powder (GLYCOLAX/MIRALAX) powder Mix 17 g into water once to twice daily as needed for constipation. 3350 g 0  . venlafaxine XR (EFFEXOR-XR) 75 MG 24 hr capsule Take 1 capsule (75 mg total) by mouth daily with breakfast. For anxiety/depression. 90 capsule 1  . vitamin C (ASCORBIC ACID) 500 MG tablet Take 500 mg by mouth daily.     No current facility-administered medications on file prior to visit.     There were no vitals taken for this visit.   Objective:   Physical Exam  Constitutional: She is oriented to person, place, and time.  Respiratory: Effort normal.  Speaking in complete sentences  Neurological: She is alert and oriented to person, place, and time.  Psychiatric: She has a normal mood and affect.           Assessment & Plan:

## 2018-12-30 NOTE — Patient Instructions (Addendum)
Stop taking hydralazine 25 mg tablets for blood pressure.  We've increased your irbesartan to 300 mg. You may take two of your irbesartan tablets until your bottle is empty. I will send a new prescription for the 300 mg tablets to your pharmacy.  Continue taking metoprolol 25 mg daily for blood pressure.  Continue to monitor your blood pressure as discussed, we will message/call you in a few weeks for some readings.  Please notify me sooner if you see readings at or below 100/60 or at or above 140/90.  Have a good day!

## 2019-01-07 ENCOUNTER — Telehealth: Payer: Self-pay | Admitting: *Deleted

## 2019-01-07 NOTE — Telephone Encounter (Signed)
Patient called stating that she did increase her Irbesartan to 300 mg and is also taking her Metoprolol. Patient stated that she does have the Hydralazine, but has not been taking it. Patient stated that her BP this morning was 181/87. Patient wants to know what she should do?

## 2019-01-07 NOTE — Telephone Encounter (Signed)
See MyChart messages.

## 2019-01-08 ENCOUNTER — Other Ambulatory Visit: Payer: Self-pay | Admitting: Primary Care

## 2019-01-08 DIAGNOSIS — I10 Essential (primary) hypertension: Secondary | ICD-10-CM

## 2019-01-27 ENCOUNTER — Other Ambulatory Visit: Payer: Self-pay | Admitting: Primary Care

## 2019-01-27 DIAGNOSIS — I10 Essential (primary) hypertension: Secondary | ICD-10-CM

## 2019-01-28 ENCOUNTER — Other Ambulatory Visit: Payer: Self-pay | Admitting: Primary Care

## 2019-01-28 DIAGNOSIS — I10 Essential (primary) hypertension: Secondary | ICD-10-CM

## 2019-01-30 ENCOUNTER — Telehealth: Payer: Self-pay

## 2019-01-30 NOTE — Telephone Encounter (Signed)
Can we set her up for a virtual visit for Friday or early next week?

## 2019-01-30 NOTE — Telephone Encounter (Signed)
Pt had virtual visit on 12/30/18. Since then pts BP has been fluctuating. This past week highest reading was this morning at 174/87 and the lowest reading was 136/76 on 01/27/19. Pt checked BP now at 12:10 pm and BP is 176/88.pt took metoprolol 25 mg and irbesartan 300 mg at 10 AM this morning.ankles are slightly swollen but much better than when taking the amlodipine. No H/A,dizziness,CP,SOB or blurred vision.pt wants to know what to do. [facility].

## 2019-01-31 ENCOUNTER — Ambulatory Visit (INDEPENDENT_AMBULATORY_CARE_PROVIDER_SITE_OTHER): Payer: PPO | Admitting: Primary Care

## 2019-01-31 ENCOUNTER — Encounter: Payer: Self-pay | Admitting: Primary Care

## 2019-01-31 ENCOUNTER — Other Ambulatory Visit: Payer: Self-pay

## 2019-01-31 VITALS — BP 175/88 | HR 77

## 2019-01-31 DIAGNOSIS — I10 Essential (primary) hypertension: Secondary | ICD-10-CM

## 2019-01-31 MED ORDER — HYDRALAZINE HCL 50 MG PO TABS: ORAL_TABLET | ORAL | 0 refills | Status: DC

## 2019-01-31 NOTE — Progress Notes (Signed)
Subjective:    [name], female    DOB: October 28, 1945, 73 y.o.   MRN: [id number]  HPI  Virtual Visit via Video Note  I connected with ZAYDEN HAHNE on 01/31/19 at 11:00 AM EDT by a video enabled telemedicine application and verified that I am speaking with the correct person using two identifiers.   I discussed the limitations of evaluation and management by telemedicine and the availability of in person appointments. The patient expressed understanding and agreed to proceed. She is at home, I am in the office.  We attempted a video visit and connected briefly until the video failed. We completed our visit via phone.   History of Present Illness:  Morgan [name] is a 73 year old female who presents today for follow up of hypertension.  She is currently managed on irbesartan 300 mg, metoprolol succinate 25 mg. She cannot tolerate Amlodipine due to ankle edema and has hyponatremia with HCTZ.   BP Readings from Last 3 Encounters:  01/31/19 (!) 175/88  12/16/18 (!) 144/84  12/02/18 (!) 150/82   Today she endorses a fluctuation of blood pressure. She is compliant to her metoprolol succinate 25 mg and irbesartan 300 mg. She was actually taking the hydralazine 25 mg BID up until 10 days ago. When taking hydralazine she would notice readings of 130's-160's/80's, mostly 150's/80's. Since coming off of the hydralazine she's seen readings of 148/80, 136/76, 163/84, 171/81, 174/87, 152/71, 175/88. She denies dizziness, chest pain, shortness of breath. She does still have some ankle edema.    Observations/Objective:  Alert and oriented. Speaking in complete sentences. Appears well. No distress.  Assessment and Plan:  See problem based charting.  Follow Up Instructions:  Continue metoprolol succinate 25 mg daily. Continue irbesartan 300 mg daily.  Start hydralazine 50 mg. Take 1/2 tablet in the morning and 1 full tablet in the afternoon.  Continue to monitor your blood  pressure and schedule a follow up visit with me for two weeks.  It was a pleasure to see you today!    I discussed the assessment and treatment plan with the patient. The patient was provided an opportunity to ask questions and all were answered. The patient agreed with the plan and demonstrated an understanding of the instructions.   The patient was advised to call back or seek an in-person evaluation if the symptoms worsen or if the condition fails to improve as anticipated.     [name], NP    Review of Systems  Respiratory: Negative for shortness of breath.   Cardiovascular: Negative for chest pain.  Neurological: Negative for dizziness and headaches.       Past Medical History:  Diagnosis Date  . COPD (chronic obstructive pulmonary disease) ([place])   . GERD (gastroesophageal reflux disease)   . Hyperlipidemia   . Hypertension   . Seasonal allergies      Social History   Socioeconomic History  . Marital status: Married    Spouse name: Not on file  . Number of children: Not on file  . Years of education: Not on file  . Highest education level: Not on file  Occupational History  . Not on file  Social Needs  . Financial resource strain: Not on file  . Food insecurity:    Worry: Not on file    Inability: Not on file  . Transportation needs:    Medical: Not on file    Non-medical: Not on file  Tobacco Use  .  Smoking status: Current Every Day Smoker    Packs/day: 1.00    Years: 56.00    Pack years: 56.00    Types: E-cigarettes  . Smokeless tobacco: Never Used  . Tobacco comment: stopped cigs Dec 2019  Substance and Sexual Activity  . Alcohol use: Yes    Alcohol/week: 0.0 standard drinks    Comment: 2 to 3 beer a day  . Drug use: No  . Sexual activity: Not on file  Lifestyle  . Physical activity:    Days per week: Not on file    Minutes per session: Not on file  . Stress: Not on file  Relationships  . Social connections:    Talks on phone:  Not on file    Gets together: Not on file    Attends religious service: Not on file    Active member of club or organization: Not on file    Attends meetings of clubs or organizations: Not on file    Relationship status: Not on file  . Intimate partner violence:    Fear of current or ex partner: Not on file    Emotionally abused: Not on file    Physically abused: Not on file    Forced sexual activity: Not on file  Other Topics Concern  . Not on file  Social History Narrative   Married.   2 children, 3 grandchildren.   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   Enjoys spending time with her family, going to the beach and movies.     Past Surgical History:  Procedure Laterality Date  . BACK SURGERY      Family History  Problem Relation Age of Onset  . Heart disease Father        before age 59    Allergies  Allergen Reactions  . Hctz [Hydrochlorothiazide] Other (See Comments)    Hyponatremia   . Amlodipine Swelling  . Azithromycin Nausea And Vomiting  . Codeine Itching    Makes me crazy    Current Outpatient Medications on File Prior to Visit  Medication Sig Dispense Refill  . albuterol (PROAIR HFA) 108 (90 Base) MCG/ACT inhaler Inhale 2 puffs into the lungs every 4 (four) hours as needed for wheezing or shortness of breath. 1 Inhaler 0  . aspirin EC 81 MG tablet Take 81 mg by mouth daily.    . Cholecalciferol (D-3-5) 5000 UNITS capsule Take 5,000 Units by mouth daily.    Marland Kitchen ibuprofen (ADVIL,MOTRIN) 200 MG tablet Take 400-600 mg by mouth every 8 (eight) hours as needed for mild pain or moderate pain.    Marland Kitchen irbesartan (AVAPRO) 300 MG tablet Take 1 tablet (300 mg total) by mouth daily. For blood pressure. 90 tablet 0  . metoprolol succinate (TOPROL-XL) 25 MG 24 hr tablet Take 1 tablet (25 mg total) by mouth daily. For blood pressure. 90 tablet 1  . Multiple Vitamins-Minerals (MULTIVITAMIN GUMMIES ADULTS) CHEW Chew 2 each by mouth daily.    Marland Kitchen omeprazole (PRILOSEC) 20  MG capsule Take 1 capsule (20 mg total) by mouth daily. For heartburn. 90 capsule 3  . polyethylene glycol powder (GLYCOLAX/MIRALAX) powder Mix 17 g into water once to twice daily as needed for constipation. 3350 g 0  . simvastatin (ZOCOR) 40 MG tablet Take 1 tablet (40 mg total) by mouth every evening. For cholesterol. 90 tablet 1  . venlafaxine XR (EFFEXOR-XR) 75 MG 24 hr capsule Take 1 capsule (75 mg total) by mouth daily with breakfast. For anxiety/depression.  90 capsule 1  . vitamin C (ASCORBIC ACID) 500 MG tablet Take 500 mg by mouth daily.     No current facility-administered medications on file prior to visit.     BP (!) 175/88   Pulse 77    Objective:   Physical Exam  Constitutional: She is oriented to person, place, and time. She appears well-nourished.  Respiratory: Effort normal. No respiratory distress.  Neurological: She is alert and oriented to person, place, and time.  Skin: Skin is warm and dry.  Psychiatric: She has a normal mood and affect.           Assessment & Plan:

## 2019-01-31 NOTE — Patient Instructions (Signed)
Continue metoprolol succinate 25 mg daily. Continue irbesartan 300 mg daily.  Start hydralazine 50 mg. Take 1/2 tablet in the morning and 1 full tablet in the afternoon.  Continue to monitor your blood pressure and schedule a follow up visit with me for two weeks.  It was a pleasure to see you today!

## 2019-01-31 NOTE — Assessment & Plan Note (Signed)
Above goal. Seemed to be doing slightly better on hydralazine 25 mg BID, but still above goal.  Continue irbesartan 300 mg and metoprolol succinate 25 mg, increase hydralazine to 25 mg in the AM and 50 mg in the afternoon.   She will monitor BP and we will see her back in 2 weeks.

## 2019-02-01 ENCOUNTER — Other Ambulatory Visit: Payer: Self-pay | Admitting: Primary Care

## 2019-02-01 DIAGNOSIS — I10 Essential (primary) hypertension: Secondary | ICD-10-CM

## 2019-02-27 ENCOUNTER — Other Ambulatory Visit: Payer: Self-pay | Admitting: Primary Care

## 2019-02-27 DIAGNOSIS — I10 Essential (primary) hypertension: Secondary | ICD-10-CM

## 2019-03-05 ENCOUNTER — Ambulatory Visit: Payer: PPO | Admitting: *Deleted

## 2019-03-05 ENCOUNTER — Encounter: Payer: Self-pay | Admitting: Primary Care

## 2019-03-05 ENCOUNTER — Other Ambulatory Visit (INDEPENDENT_AMBULATORY_CARE_PROVIDER_SITE_OTHER): Payer: PPO

## 2019-03-05 ENCOUNTER — Ambulatory Visit (INDEPENDENT_AMBULATORY_CARE_PROVIDER_SITE_OTHER): Payer: PPO | Admitting: Primary Care

## 2019-03-05 VITALS — BP 160/76 | HR 70 | Temp 98.5°F | Wt 134.0 lb

## 2019-03-05 DIAGNOSIS — R7303 Prediabetes: Secondary | ICD-10-CM

## 2019-03-05 DIAGNOSIS — E119 Type 2 diabetes mellitus without complications: Secondary | ICD-10-CM | POA: Insufficient documentation

## 2019-03-05 DIAGNOSIS — I10 Essential (primary) hypertension: Secondary | ICD-10-CM | POA: Diagnosis not present

## 2019-03-05 LAB — POCT GLYCOSYLATED HEMOGLOBIN (HGB A1C): Hemoglobin A1C: 5.5 % (ref 4.0–5.6)

## 2019-03-05 NOTE — Progress Notes (Signed)
Subjective:    [name], female    DOB: 01/31/1946, 73 y.o.   MRN: [id number]  HPI  Virtual Visit via Video Note  I connected with Morgan [name] on 03/05/19 at 10:20 AM EDT by a video enabled telemedicine application and verified that I am speaking with the correct person using two identifiers.  Location: Patient: Home Provider: Office   I discussed the limitations of evaluation and management by telemedicine and the availability of in person appointments. The patient expressed understanding and agreed to proceed.  History of Present Illness:  Morgan [name] is a 73 year old female who presents today for follow up of hypertension.  She is currently managed on hydralazine 25 mg every morning and 50 mg HS, metoprolol succinate 25 mg daily, irbesartan 300 mg daily.   Her son passed away unexpectedly about one week ago and since then she's felt fatigued. Prior to the passing of her son she started feeling fatigued. She continues to notice mild ankle edema, more swelling during the evenings. She does have some headaches and has been taking Aleve as needed with resolve.    She's checking her BP at home which is running 152/79, 149/71, 146/89, 131/60, 161/74, 108/59, 132/95, 138/73, 143/67, 143/79, 135/73, 128/63 (morning after her son passed away), 154/81, 169/81, 158/79, 171/83, 142/80, 152/79, 163/81, 129/60, 168/79, 150/77 (this morning).     Observations/Objective:  Alert and oriented. Appears well, not sickly. No distress. Speaking in complete sentences.   Assessment and Plan:  See problem based charting  Follow Up Instructions:  Call the main office to schedule a nurse visit for BP check and also a lab appointment for the diabetes test.  If your blood pressure is too high then we will increase your morning hydralazine to 50 mg and continue your evening hydralazine at 50 mg. Continue taking metoprolol succinate and irbesartan for blood pressure.  We will be  in touch soon! It was a pleasure to see you today! Morgan Bossier, NP-C    I discussed the assessment and treatment plan with the patient. The patient was provided an opportunity to ask questions and all were answered. The patient agreed with the plan and demonstrated an understanding of the instructions.   The patient was advised to call back or seek an in-person evaluation if the symptoms worsen or if the condition fails to improve as anticipated.    Morgan [name], NP    Review of Systems  Constitutional: Positive for fatigue.  Respiratory: Negative for shortness of breath.   Cardiovascular: Negative for chest pain.       Mild ankle edema, improved overall  Neurological: Positive for headaches.       Past Medical History:  Diagnosis Date  . COPD (chronic obstructive pulmonary disease) ([place])   . GERD (gastroesophageal reflux disease)   . Hyperlipidemia   . Hypertension   . Seasonal allergies      Social History   Socioeconomic History  . Marital status: Married    Spouse name: Not on file  . Number of children: Not on file  . Years of education: Not on file  . Highest education level: Not on file  Occupational History  . Not on file  Social Needs  . Financial resource strain: Not on file  . Food insecurity:    Worry: Not on file    Inability: Not on file  . Transportation needs:    Medical: Not on file    Non-medical:  Not on file  Tobacco Use  . Smoking status: Current Every Day Smoker    Packs/day: 1.00    Years: 56.00    Pack years: 56.00    Types: E-cigarettes  . Smokeless tobacco: Never Used  . Tobacco comment: stopped cigs Dec 2019  Substance and Sexual Activity  . Alcohol use: Yes    Alcohol/week: 0.0 standard drinks    Comment: 2 to 3 beer a day  . Drug use: No  . Sexual activity: Not on file  Lifestyle  . Physical activity:    Days per week: Not on file    Minutes per session: Not on file  . Stress: Not on file  Relationships  . Social  connections:    Talks on phone: Not on file    Gets together: Not on file    Attends religious service: Not on file    Active member of club or organization: Not on file    Attends meetings of clubs or organizations: Not on file    Relationship status: Not on file  . Intimate partner violence:    Fear of current or ex partner: Not on file    Emotionally abused: Not on file    Physically abused: Not on file    Forced sexual activity: Not on file  Other Topics Concern  . Not on file  Social History Narrative   Married.   2 children, 3 grandchildren.   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   Enjoys spending time with her family, going to the beach and movies.     Past Surgical History:  Procedure Laterality Date  . BACK SURGERY      Family History  Problem Relation Age of Onset  . Heart disease Father        before age 62    Allergies  Allergen Reactions  . Hctz [Hydrochlorothiazide] Other (See Comments)    Hyponatremia   . Amlodipine Swelling  . Azithromycin Nausea And Vomiting  . Codeine Itching    Makes me crazy    Current Outpatient Medications on File Prior to Visit  Medication Sig Dispense Refill  . albuterol (PROAIR HFA) 108 (90 Base) MCG/ACT inhaler Inhale 2 puffs into the lungs every 4 (four) hours as needed for wheezing or shortness of breath. 1 Inhaler 0  . aspirin EC 81 MG tablet Take 81 mg by mouth daily.    . Cholecalciferol (D-3-5) 5000 UNITS capsule Take 5,000 Units by mouth daily.    . hydrALAZINE (APRESOLINE) 50 MG tablet TAKE 1/2 TABLET EVERY MORNING AND 1 FULL TABLET IN THE EVENING FOR BLOOD PRESSURE. 45 tablet 0  . ibuprofen (ADVIL,MOTRIN) 200 MG tablet Take 400-600 mg by mouth every 8 (eight) hours as needed for mild pain or moderate pain.    Marland Kitchen irbesartan (AVAPRO) 300 MG tablet Take 1 tablet (300 mg total) by mouth daily. For blood pressure. 90 tablet 0  . metoprolol succinate (TOPROL-XL) 25 MG 24 hr tablet Take 1 tablet (25 mg total) by  mouth daily. For blood pressure. 90 tablet 1  . Multiple Vitamins-Minerals (MULTIVITAMIN GUMMIES ADULTS) CHEW Chew 2 each by mouth daily.    Marland Kitchen omeprazole (PRILOSEC) 20 MG capsule Take 1 capsule (20 mg total) by mouth daily. For heartburn. 90 capsule 3  . polyethylene glycol powder (GLYCOLAX/MIRALAX) powder Mix 17 g into water once to twice daily as needed for constipation. 3350 g 0  . simvastatin (ZOCOR) 40 MG tablet Take 1 tablet (40 mg  total) by mouth every evening. For cholesterol. 90 tablet 1  . venlafaxine XR (EFFEXOR-XR) 75 MG 24 hr capsule Take 1 capsule (75 mg total) by mouth daily with breakfast. For anxiety/depression. 90 capsule 1  . vitamin C (ASCORBIC ACID) 500 MG tablet Take 500 mg by mouth daily.     No current facility-administered medications on file prior to visit.     BP (!) 150/77   Pulse 84   Temp 98.5 F (36.9 C) (Oral)   Wt 134 lb (60.8 kg)   BMI 25.32 kg/m    Objective:   Physical Exam  Constitutional: She is oriented to person, place, and time. She appears well-nourished.  Respiratory: Effort normal.  Neurological: She is alert and oriented to person, place, and time.  Psychiatric: She has a normal mood and affect.           Assessment & Plan:

## 2019-03-05 NOTE — Progress Notes (Signed)
Per [name], NP encounter order on 03/05/2019, patient presents today for a nurse visit blood pressure check for ongoing follow up and management.  Vital Sign Readings today BP 160/76 P 70.

## 2019-03-05 NOTE — Assessment & Plan Note (Signed)
Fluctuations of BP with improved readings in the evening. Will have her start by scheduling a nurse visit for BP check in our office. Once we have these results we will be able to make a decision on treatment.  If BP above goal then increase hydralazine to 50 mg in the AM, continue 50 mg in the PM. Continue metoprolol succinate and irbesartan.  Await BP check.

## 2019-03-05 NOTE — Patient Instructions (Signed)
Call the main office to schedule a nurse visit for BP check and also a lab appointment for the diabetes test.  If your blood pressure is too high then we will increase your morning hydralazine to 50 mg and continue your evening hydralazine at 50 mg. Continue taking metoprolol succinate and irbesartan for blood pressure.  We will be in touch soon! It was a pleasure to see you today! [name], NP-C

## 2019-03-05 NOTE — Assessment & Plan Note (Signed)
A1C of 6.2 in November 2019, repeat A1C pending.

## 2019-03-10 ENCOUNTER — Telehealth: Payer: Self-pay | Admitting: *Deleted

## 2019-03-10 DIAGNOSIS — I10 Essential (primary) hypertension: Secondary | ICD-10-CM

## 2019-03-10 DIAGNOSIS — E538 Deficiency of other specified B group vitamins: Secondary | ICD-10-CM

## 2019-03-10 NOTE — Telephone Encounter (Signed)
Message left for patient to return my call.  

## 2019-03-10 NOTE — Telephone Encounter (Signed)
It seems like her BP is coming down from the initial check nearly every time. Her BP goal is to be at or less than 140/90. I would like to recheck some labs to make sure there's nothing abnormal causing this. Will you please schedule her?

## 2019-03-10 NOTE — Telephone Encounter (Signed)
Spoken and notified patient of Morgan [name] comments. Patient verbalized understanding.  Lab appt on 03/12/2019

## 2019-03-10 NOTE — Telephone Encounter (Signed)
Patient called stating that she is still having problems with her blood pressure. Patient stated that these are the readings that she has gotten: Thursday 03/06/19 166/81 Friday  03/07/19 171/77,  134/64  1:30 110/57 felt a little disoriented 9:00 169/80 Saturday 03/08/19 160/77 plus 73, 9:00 am took medication, 11:25 am 100/50/ pulse 119, 10:00 pm 151/73 Sunday May 31/20 156/77,  2 and half hours later 135/66 pulse 105, 2:00 111/71 91 pulse 3:30 pm 118/66 pulse 98 Today 157/85 pulse 93

## 2019-03-12 ENCOUNTER — Other Ambulatory Visit (INDEPENDENT_AMBULATORY_CARE_PROVIDER_SITE_OTHER): Payer: PPO

## 2019-03-12 DIAGNOSIS — I10 Essential (primary) hypertension: Secondary | ICD-10-CM | POA: Diagnosis not present

## 2019-03-12 DIAGNOSIS — E538 Deficiency of other specified B group vitamins: Secondary | ICD-10-CM

## 2019-03-12 LAB — TSH: TSH: 0.81 u[IU]/mL (ref 0.35–4.50)

## 2019-03-12 LAB — CBC
HCT: 34.8 % — ABNORMAL LOW (ref 36.0–46.0)
Hemoglobin: 11.7 g/dL — ABNORMAL LOW (ref 12.0–15.0)
MCHC: 33.8 g/dL (ref 30.0–36.0)
MCV: 96.9 fl (ref 78.0–100.0)
Platelets: 342 10*3/uL (ref 150.0–400.0)
RBC: 3.59 Mil/uL — ABNORMAL LOW (ref 3.87–5.11)
RDW: 12.8 % (ref 11.5–15.5)
WBC: 7.6 10*3/uL (ref 4.0–10.5)

## 2019-03-12 LAB — VITAMIN B12: Vitamin B-12: >1500 pg/mL — ABNORMAL HIGH (ref 211–911)

## 2019-03-18 DIAGNOSIS — E559 Vitamin D deficiency, unspecified: Secondary | ICD-10-CM | POA: Diagnosis not present

## 2019-03-18 DIAGNOSIS — M810 Age-related osteoporosis without current pathological fracture: Secondary | ICD-10-CM | POA: Diagnosis not present

## 2019-03-28 ENCOUNTER — Other Ambulatory Visit: Payer: Self-pay | Admitting: Primary Care

## 2019-03-28 DIAGNOSIS — L720 Epidermal cyst: Secondary | ICD-10-CM | POA: Diagnosis not present

## 2019-03-28 DIAGNOSIS — I10 Essential (primary) hypertension: Secondary | ICD-10-CM

## 2019-03-28 DIAGNOSIS — L821 Other seborrheic keratosis: Secondary | ICD-10-CM | POA: Diagnosis not present

## 2019-03-28 DIAGNOSIS — L820 Inflamed seborrheic keratosis: Secondary | ICD-10-CM | POA: Diagnosis not present

## 2019-03-28 DIAGNOSIS — L570 Actinic keratosis: Secondary | ICD-10-CM | POA: Diagnosis not present

## 2019-03-28 DIAGNOSIS — L578 Other skin changes due to chronic exposure to nonionizing radiation: Secondary | ICD-10-CM | POA: Diagnosis not present

## 2019-03-28 NOTE — Telephone Encounter (Deleted)
Last prescribed on  . Last appointment on     . Next future appointment

## 2019-03-29 ENCOUNTER — Other Ambulatory Visit: Payer: Self-pay | Admitting: Primary Care

## 2019-03-29 DIAGNOSIS — I10 Essential (primary) hypertension: Secondary | ICD-10-CM

## 2019-04-02 DIAGNOSIS — M6200 Separation of muscle (nontraumatic), unspecified site: Secondary | ICD-10-CM | POA: Diagnosis not present

## 2019-04-24 ENCOUNTER — Other Ambulatory Visit: Payer: Self-pay | Admitting: Primary Care

## 2019-04-24 DIAGNOSIS — I10 Essential (primary) hypertension: Secondary | ICD-10-CM

## 2019-05-01 ENCOUNTER — Encounter: Payer: Self-pay | Admitting: Family Medicine

## 2019-05-01 ENCOUNTER — Ambulatory Visit (INDEPENDENT_AMBULATORY_CARE_PROVIDER_SITE_OTHER): Payer: PPO | Admitting: Family Medicine

## 2019-05-01 VITALS — BP 149/79 | HR 107 | Temp 100.3°F | Wt 136.0 lb

## 2019-05-01 DIAGNOSIS — J441 Chronic obstructive pulmonary disease with (acute) exacerbation: Secondary | ICD-10-CM | POA: Diagnosis not present

## 2019-05-01 DIAGNOSIS — J449 Chronic obstructive pulmonary disease, unspecified: Secondary | ICD-10-CM

## 2019-05-01 DIAGNOSIS — J069 Acute upper respiratory infection, unspecified: Secondary | ICD-10-CM

## 2019-05-01 MED ORDER — DOXYCYCLINE HYCLATE 100 MG PO TABS: 100.0000 mg | ORAL_TABLET | Freq: Two times a day (BID) | ORAL | 0 refills | Status: DC

## 2019-05-01 MED ORDER — ALBUTEROL SULFATE HFA 108 (90 BASE) MCG/ACT IN AERS: 2.0000 | INHALATION_SPRAY | RESPIRATORY_TRACT | 1 refills | Status: DC | PRN

## 2019-05-01 NOTE — Patient Instructions (Signed)
Covid-19 testing  Drive up testing at  [place] under the overhang of the entrance closest to the corner of [address] and [address]   COPD exacerbation  1) Albuterol every 6 hours for the next 24-48 hours 2) Take the antibiotic 3) Call back if symptoms not improving  4) If worsening breathing difficulty, high fevers/chills > go to the ER.

## 2019-05-01 NOTE — Progress Notes (Signed)
I connected with Morgan [name] on 05/01/19 at  4:00 PM EDT by video and verified that I am speaking with the correct person using two identifiers.   I discussed the limitations, risks, security and privacy concerns of performing an evaluation and management service by video/telephone and the availability of in person appointments. I also discussed with the patient that there may be a patient responsible charge related to this service. The patient expressed understanding and agreed to proceed.  Patient location: Home Provider Location: [facility] Participants: Lesleigh Noe and Minola Guin [facility]   Subjective:     Morgan [name] is a 73 y.o. female presenting for Cough (shortness of breath, congestion, some wheezing. Started 2 weeks ago.)     Cough This is a new problem. The current episode started 1 to 4 weeks ago. The problem has been gradually worsening. The cough is productive of sputum. Associated symptoms include a fever, nasal congestion, postnasal drip, rhinorrhea, shortness of breath (even when walking short distance) and wheezing. Pertinent negatives include no chest pain, chills, ear congestion, ear pain, headaches, myalgias or sore throat. The symptoms are aggravated by lying down. She has tried ipratropium inhaler (mucinex, antibiotics, ) for the symptoms.   Walking with SOB - the last few days while inside even walking across the room is having a hard time breathing.   Ran out of albuterol. Has been using husband's Spiriva w/ some improvement. The last 2 days has been getting worse.  Did start taking amoxicillin from husband when it was left over w/o improvement   Sick contact: no Went to [place] - stayed with son's condo, and tried to social distance - was having symptoms prior to this   Review of Systems  Constitutional: Positive for fever. Negative for chills.  HENT: Positive for postnasal drip and rhinorrhea. Negative for ear pain and sore throat.    Respiratory: Positive for cough, shortness of breath (even when walking short distance) and wheezing.   Cardiovascular: Negative for chest pain.  Musculoskeletal: Positive for back pain (from coughing). Negative for myalgias.  Neurological: Negative for headaches.     Social History   Tobacco Use  Smoking Status Former Smoker  . Packs/day: 1.00  . Years: 56.00  . Pack years: 56.00  . Types: E-cigarettes  . Quit date: 09/08/2018  . Years since quitting: 0.6  Smokeless Tobacco Never Used  Tobacco Comment   stopped cigs Dec 2019        Objective:   BP Readings from Last 3 Encounters:  05/01/19 (!) 149/79  03/05/19 (!) 160/76  01/31/19 (!) 175/88   Wt Readings from Last 3 Encounters:  05/01/19 136 lb (61.7 kg)  03/05/19 134 lb (60.8 kg)  12/16/18 137 lb 4 oz (62.3 kg)   BP (!) 149/79 Comment: per patient  Pulse (!) 107 Comment: per patient  Temp 100.3 F (37.9 C) Comment: per patient  Wt 136 lb (61.7 kg)   BMI 25.70 kg/m   Physical Exam Constitutional:      Appearance: Normal appearance. She is not ill-appearing.  HENT:     Head: Normocephalic and atraumatic.     Right Ear: External ear normal.     Left Ear: External ear normal.  Eyes:     Conjunctiva/sclera: Conjunctivae normal.  Pulmonary:     Effort: Pulmonary effort is normal. No respiratory distress.  Neurological:     Mental Status: She is alert. Mental status is at baseline.  Psychiatric:  Mood and Affect: Mood normal.        Behavior: Behavior normal.        Thought Content: Thought content normal.        Judgment: Judgment normal.           Assessment & Plan:   Problem List Items Addressed This Visit      Respiratory   COPD (chronic obstructive pulmonary disease) (HCC) - Primary   Relevant Medications   albuterol (PROAIR HFA) 108 (90 Base) MCG/ACT inhaler   Other Relevant Orders   Novel Coronavirus, NAA ([facility])    Other Visit Diagnoses    Acute upper respiratory infection        Relevant Medications   albuterol (PROAIR HFA) 108 (90 Base) MCG/ACT inhaler   Other Relevant Orders   Novel Coronavirus, NAA ([facility])   COPD exacerbation (HCC)       Relevant Medications   doxycycline (VIBRA-TABS) 100 MG tablet   albuterol (PROAIR HFA) 108 (90 Base) MCG/ACT inhaler     Given COPD hx and SOB, productive cough -- suspect exacerbation, however, due to covid-19 recommend that patient be tested. Allergy to azithromycin so will treat with doxy.   Pt complaining of SOB with short ambulation but speaking in complete sentences and breathing w/o the use of accessory muscles.    Given symptoms possible covid-19 and would recommend being tested ER precautions given Advised staying out of work and isolating until results have come back Order placed and advised to go to the [employer]   No follow-ups on file.  Lesleigh Noe, MD

## 2019-05-02 ENCOUNTER — Other Ambulatory Visit: Payer: Self-pay

## 2019-05-02 DIAGNOSIS — Z20822 Contact with and (suspected) exposure to covid-19: Secondary | ICD-10-CM

## 2019-05-02 DIAGNOSIS — R6889 Other general symptoms and signs: Secondary | ICD-10-CM | POA: Diagnosis not present

## 2019-05-05 ENCOUNTER — Ambulatory Visit: Payer: PPO | Admitting: Family Medicine

## 2019-05-05 ENCOUNTER — Encounter (HOSPITAL_COMMUNITY): Payer: Self-pay | Admitting: Emergency Medicine

## 2019-05-05 ENCOUNTER — Emergency Department (HOSPITAL_COMMUNITY): Payer: PPO

## 2019-05-05 ENCOUNTER — Inpatient Hospital Stay (HOSPITAL_COMMUNITY)
Admission: EM | Admit: 2019-05-05 | Discharge: 2019-05-07 | DRG: 190 | Disposition: A | Discharge status: Home / Self Care | Payer: PPO | Attending: Internal Medicine | Admitting: Internal Medicine

## 2019-05-05 ENCOUNTER — Other Ambulatory Visit: Payer: Self-pay

## 2019-05-05 DIAGNOSIS — J9601 Acute respiratory failure with hypoxia: Secondary | ICD-10-CM | POA: Diagnosis present

## 2019-05-05 DIAGNOSIS — F17290 Nicotine dependence, other tobacco product, uncomplicated: Secondary | ICD-10-CM | POA: Diagnosis present

## 2019-05-05 DIAGNOSIS — Z20828 Contact with and (suspected) exposure to other viral communicable diseases: Secondary | ICD-10-CM | POA: Diagnosis present

## 2019-05-05 DIAGNOSIS — R0602 Shortness of breath: Secondary | ICD-10-CM | POA: Diagnosis not present

## 2019-05-05 DIAGNOSIS — K219 Gastro-esophageal reflux disease without esophagitis: Secondary | ICD-10-CM | POA: Diagnosis present

## 2019-05-05 DIAGNOSIS — Z8249 Family history of ischemic heart disease and other diseases of the circulatory system: Secondary | ICD-10-CM | POA: Diagnosis not present

## 2019-05-05 DIAGNOSIS — F419 Anxiety disorder, unspecified: Secondary | ICD-10-CM

## 2019-05-05 DIAGNOSIS — F32A Depression, unspecified: Secondary | ICD-10-CM | POA: Diagnosis present

## 2019-05-05 DIAGNOSIS — E785 Hyperlipidemia, unspecified: Secondary | ICD-10-CM

## 2019-05-05 DIAGNOSIS — Z791 Long term (current) use of non-steroidal anti-inflammatories (NSAID): Secondary | ICD-10-CM

## 2019-05-05 DIAGNOSIS — J441 Chronic obstructive pulmonary disease with (acute) exacerbation: Secondary | ICD-10-CM

## 2019-05-05 DIAGNOSIS — Z716 Tobacco abuse counseling: Secondary | ICD-10-CM

## 2019-05-05 DIAGNOSIS — Z79899 Other long term (current) drug therapy: Secondary | ICD-10-CM

## 2019-05-05 DIAGNOSIS — F329 Major depressive disorder, single episode, unspecified: Secondary | ICD-10-CM | POA: Diagnosis not present

## 2019-05-05 DIAGNOSIS — F411 Generalized anxiety disorder: Secondary | ICD-10-CM | POA: Diagnosis not present

## 2019-05-05 DIAGNOSIS — Z885 Allergy status to narcotic agent status: Secondary | ICD-10-CM

## 2019-05-05 DIAGNOSIS — Z888 Allergy status to other drugs, medicaments and biological substances status: Secondary | ICD-10-CM

## 2019-05-05 DIAGNOSIS — R05 Cough: Secondary | ICD-10-CM | POA: Diagnosis not present

## 2019-05-05 DIAGNOSIS — F17200 Nicotine dependence, unspecified, uncomplicated: Secondary | ICD-10-CM | POA: Diagnosis present

## 2019-05-05 DIAGNOSIS — I700 Atherosclerosis of aorta: Secondary | ICD-10-CM | POA: Diagnosis not present

## 2019-05-05 DIAGNOSIS — I10 Essential (primary) hypertension: Secondary | ICD-10-CM | POA: Diagnosis not present

## 2019-05-05 DIAGNOSIS — Z881 Allergy status to other antibiotic agents status: Secondary | ICD-10-CM | POA: Diagnosis not present

## 2019-05-05 DIAGNOSIS — Z7982 Long term (current) use of aspirin: Secondary | ICD-10-CM | POA: Diagnosis not present

## 2019-05-05 DIAGNOSIS — Z720 Tobacco use: Secondary | ICD-10-CM | POA: Diagnosis present

## 2019-05-05 HISTORY — DX: Chronic obstructive pulmonary disease with (acute) exacerbation: J44.1

## 2019-05-05 LAB — COMPREHENSIVE METABOLIC PANEL
ALT: 16 U/L (ref 0–44)
AST: 27 U/L (ref 15–41)
Albumin: 4.5 g/dL (ref 3.5–5.0)
Alkaline Phosphatase: 61 U/L (ref 38–126)
Anion gap: 12 (ref 5–15)
BUN: 11 mg/dL (ref 8–23)
CO2: 22 mmol/L (ref 22–32)
Calcium: 9.3 mg/dL (ref 8.9–10.3)
Chloride: 100 mmol/L (ref 98–111)
Creatinine, Ser: 0.72 mg/dL (ref 0.44–1.00)
GFR calc Af Amer: >60 mL/min (ref >60)
GFR calc non Af Amer: >60 mL/min (ref >60)
Glucose, Bld: 125 mg/dL — ABNORMAL HIGH (ref 70–99)
Potassium: 4.1 mmol/L (ref 3.5–5.1)
Sodium: 134 mmol/L — ABNORMAL LOW (ref 135–145)
Total Bilirubin: 0.8 mg/dL (ref 0.3–1.2)
Total Protein: 7.2 g/dL (ref 6.5–8.1)

## 2019-05-05 LAB — POCT I-STAT EG7
Acid-base deficit: 2 mmol/L (ref 0.0–2.0)
Bicarbonate: 23.5 mmol/L (ref 20.0–28.0)
Calcium, Ion: 1.12 mmol/L — ABNORMAL LOW (ref 1.15–1.40)
HCT: 40 % (ref 36.0–46.0)
Hemoglobin: 13.6 g/dL (ref 12.0–15.0)
O2 Saturation: 80 %
Potassium: 4.1 mmol/L (ref 3.5–5.1)
Sodium: 133 mmol/L — ABNORMAL LOW (ref 135–145)
TCO2: 25 mmol/L (ref 22–32)
pCO2, Ven: 40.8 mmHg — ABNORMAL LOW (ref 44.0–60.0)
pH, Ven: 7.369 (ref 7.250–7.430)
pO2, Ven: 46 mmHg — ABNORMAL HIGH (ref 32.0–45.0)

## 2019-05-05 LAB — CBC WITH DIFFERENTIAL/PLATELET
Abs Immature Granulocytes: 0.03 10*3/uL (ref 0.00–0.07)
Basophils Absolute: 0.1 10*3/uL (ref 0.0–0.1)
Basophils Relative: 2 %
Eosinophils Absolute: 1.2 10*3/uL — ABNORMAL HIGH (ref 0.0–0.5)
Eosinophils Relative: 13 %
HCT: 37.1 % (ref 36.0–46.0)
Hemoglobin: 12.4 g/dL (ref 12.0–15.0)
Immature Granulocytes: 0 %
Lymphocytes Relative: 22 %
Lymphs Abs: 2.1 10*3/uL (ref 0.7–4.0)
MCH: 32.1 pg (ref 26.0–34.0)
MCHC: 33.4 g/dL (ref 30.0–36.0)
MCV: 96.1 fL (ref 80.0–100.0)
Monocytes Absolute: 1.1 10*3/uL — ABNORMAL HIGH (ref 0.1–1.0)
Monocytes Relative: 12 %
Neutro Abs: 4.7 10*3/uL (ref 1.7–7.7)
Neutrophils Relative %: 51 %
Platelets: 374 10*3/uL (ref 150–400)
RBC: 3.86 MIL/uL — ABNORMAL LOW (ref 3.87–5.11)
RDW: 12.8 % (ref 11.5–15.5)
WBC: 9.3 10*3/uL (ref 4.0–10.5)
nRBC: 0 % (ref 0.0–0.2)

## 2019-05-05 LAB — MRSA PCR SCREENING: MRSA by PCR: NEGATIVE

## 2019-05-05 LAB — SARS CORONAVIRUS 2 BY RT PCR (HOSPITAL ORDER, PERFORMED IN ~~LOC~~ HOSPITAL LAB): SARS Coronavirus 2: NEGATIVE

## 2019-05-05 LAB — NOVEL CORONAVIRUS, NAA: SARS-CoV-2, NAA: NOT DETECTED

## 2019-05-05 MED ORDER — VITAMIN C 500 MG PO TABS
500.0000 mg | ORAL_TABLET | Freq: Every day | ORAL | Status: DC
  Administered 2019-05-06 – 2019-05-07 (×2): 500 mg via ORAL
  Filled 2019-05-05 (×2): qty 1

## 2019-05-05 MED ORDER — ALBUTEROL SULFATE (2.5 MG/3ML) 0.083% IN NEBU
5.0000 mg | INHALATION_SOLUTION | RESPIRATORY_TRACT | Status: DC | PRN
  Administered 2019-05-06: 5 mg via RESPIRATORY_TRACT
  Filled 2019-05-05: qty 6

## 2019-05-05 MED ORDER — ALBUTEROL SULFATE HFA 108 (90 BASE) MCG/ACT IN AERS
INHALATION_SPRAY | RESPIRATORY_TRACT | Status: AC
  Administered 2019-05-05: 13:00:00 8 via RESPIRATORY_TRACT
  Filled 2019-05-05: qty 6.7

## 2019-05-05 MED ORDER — ALBUTEROL SULFATE HFA 108 (90 BASE) MCG/ACT IN AERS: 2.0000 | INHALATION_SPRAY | RESPIRATORY_TRACT | Status: DC | PRN

## 2019-05-05 MED ORDER — IRBESARTAN 150 MG PO TABS
300.0000 mg | ORAL_TABLET | Freq: Every day | ORAL | Status: DC
  Administered 2019-05-06 – 2019-05-07 (×2): 300 mg via ORAL
  Filled 2019-05-05 (×2): qty 2

## 2019-05-05 MED ORDER — METOPROLOL SUCCINATE ER 25 MG PO TB24
25.0000 mg | ORAL_TABLET | Freq: Every day | ORAL | Status: DC
  Administered 2019-05-06 – 2019-05-07 (×2): 25 mg via ORAL
  Filled 2019-05-05 (×2): qty 1

## 2019-05-05 MED ORDER — VENLAFAXINE HCL ER 75 MG PO CP24
75.0000 mg | ORAL_CAPSULE | Freq: Every day | ORAL | Status: DC
  Administered 2019-05-06 – 2019-05-07 (×2): 75 mg via ORAL
  Filled 2019-05-05 (×2): qty 1

## 2019-05-05 MED ORDER — AEROCHAMBER PLUS FLO-VU LARGE MISC
Status: AC
  Administered 2019-05-05: 13:00:00
  Filled 2019-05-05: qty 1

## 2019-05-05 MED ORDER — IBUPROFEN 200 MG PO TABS: 400.0000 mg | ORAL_TABLET | Freq: Three times a day (TID) | ORAL | Status: DC | PRN

## 2019-05-05 MED ORDER — ALBUTEROL (5 MG/ML) CONTINUOUS INHALATION SOLN
15.0000 mg/h | INHALATION_SOLUTION | Freq: Once | RESPIRATORY_TRACT | Status: AC
  Administered 2019-05-05: 15 mg/h via RESPIRATORY_TRACT
  Filled 2019-05-05: qty 20

## 2019-05-05 MED ORDER — ADULT MULTIVITAMIN W/MINERALS CH
ORAL_TABLET | Freq: Every day | ORAL | Status: DC
  Filled 2019-05-05: qty 1

## 2019-05-05 MED ORDER — METHYLPREDNISOLONE SODIUM SUCC 125 MG IJ SOLR
80.0000 mg | Freq: Four times a day (QID) | INTRAMUSCULAR | Status: AC
  Administered 2019-05-05 – 2019-05-06 (×4): 80 mg via INTRAVENOUS
  Filled 2019-05-05 (×4): qty 2

## 2019-05-05 MED ORDER — PREDNISONE 20 MG PO TABS
40.0000 mg | ORAL_TABLET | Freq: Every day | ORAL | Status: DC
  Administered 2019-05-07: 08:00:00 40 mg via ORAL
  Filled 2019-05-05: qty 2

## 2019-05-05 MED ORDER — ALBUTEROL SULFATE (2.5 MG/3ML) 0.083% IN NEBU: 5.0000 mg | INHALATION_SOLUTION | Freq: Once | RESPIRATORY_TRACT | Status: DC

## 2019-05-05 MED ORDER — HYDRALAZINE HCL 25 MG PO TABS
25.0000 mg | ORAL_TABLET | Freq: Every morning | ORAL | Status: DC
  Administered 2019-05-06 – 2019-05-07 (×2): 25 mg via ORAL
  Filled 2019-05-05 (×2): qty 1

## 2019-05-05 MED ORDER — SODIUM CHLORIDE 0.9 % IV SOLN
1.0000 g | INTRAVENOUS | Status: DC
  Administered 2019-05-05: 1 g via INTRAVENOUS
  Filled 2019-05-05: qty 10

## 2019-05-05 MED ORDER — MAGNESIUM SULFATE 2 GM/50ML IV SOLN
2.0000 g | Freq: Once | INTRAVENOUS | Status: AC
  Administered 2019-05-05: 2 g via INTRAVENOUS
  Filled 2019-05-05: qty 50

## 2019-05-05 MED ORDER — PANTOPRAZOLE SODIUM 40 MG PO TBEC
40.0000 mg | DELAYED_RELEASE_TABLET | Freq: Every day | ORAL | Status: DC
  Administered 2019-05-06 – 2019-05-07 (×2): 40 mg via ORAL
  Filled 2019-05-05 (×2): qty 1

## 2019-05-05 MED ORDER — ADULT MULTIVITAMIN W/MINERALS CH
1.0000 | ORAL_TABLET | Freq: Every day | ORAL | Status: DC
  Administered 2019-05-06 – 2019-05-07 (×2): 1 via ORAL
  Filled 2019-05-05 (×2): qty 1

## 2019-05-05 MED ORDER — SODIUM CHLORIDE 0.45 % IV SOLN
INTRAVENOUS | Status: DC
  Administered 2019-05-05: 21:00:00 via INTRAVENOUS

## 2019-05-05 MED ORDER — ENOXAPARIN SODIUM 40 MG/0.4ML ~~LOC~~ SOLN
40.0000 mg | SUBCUTANEOUS | Status: DC
  Administered 2019-05-05 – 2019-05-06 (×2): 40 mg via SUBCUTANEOUS
  Filled 2019-05-05 (×2): qty 0.4

## 2019-05-05 MED ORDER — SIMVASTATIN 20 MG PO TABS
40.0000 mg | ORAL_TABLET | Freq: Every evening | ORAL | Status: DC
  Administered 2019-05-05 – 2019-05-06 (×2): 40 mg via ORAL
  Filled 2019-05-05 (×2): qty 2

## 2019-05-05 MED ORDER — IPRATROPIUM BROMIDE HFA 17 MCG/ACT IN AERS
4.0000 | INHALATION_SPRAY | Freq: Once | RESPIRATORY_TRACT | Status: AC
  Administered 2019-05-05: 4 via RESPIRATORY_TRACT
  Filled 2019-05-05: qty 12.9

## 2019-05-05 MED ORDER — IPRATROPIUM BROMIDE 0.02 % IN SOLN
0.5000 mg | Freq: Once | RESPIRATORY_TRACT | Status: AC
  Administered 2019-05-05: 0.5 mg via RESPIRATORY_TRACT
  Filled 2019-05-05: qty 2.5

## 2019-05-05 MED ORDER — PNEUMOCOCCAL VAC POLYVALENT 25 MCG/0.5ML IJ INJ: 0.5000 mL | INJECTION | INTRAMUSCULAR | Status: DC

## 2019-05-05 MED ORDER — ALBUTEROL SULFATE HFA 108 (90 BASE) MCG/ACT IN AERS
8.0000 | INHALATION_SPRAY | RESPIRATORY_TRACT | Status: DC | PRN
  Administered 2019-05-05: 8 via RESPIRATORY_TRACT
  Filled 2019-05-05: qty 6.7

## 2019-05-05 MED ORDER — METHYLPREDNISOLONE SODIUM SUCC 125 MG IJ SOLR
125.0000 mg | Freq: Once | INTRAMUSCULAR | Status: AC
  Administered 2019-05-05: 125 mg via INTRAVENOUS
  Filled 2019-05-05: qty 2

## 2019-05-05 MED ORDER — HYDRALAZINE HCL 25 MG PO TABS: 25.0000 mg | ORAL_TABLET | ORAL | Status: DC

## 2019-05-05 MED ORDER — NICOTINE 21 MG/24HR TD PT24: 21.0000 mg | MEDICATED_PATCH | Freq: Every day | TRANSDERMAL | Status: DC

## 2019-05-05 MED ORDER — HYDRALAZINE HCL 50 MG PO TABS
50.0000 mg | ORAL_TABLET | Freq: Every day | ORAL | Status: DC
  Administered 2019-05-05 – 2019-05-06 (×2): 50 mg via ORAL
  Filled 2019-05-05 (×2): qty 1

## 2019-05-05 MED ORDER — VITAMIN D 25 MCG (1000 UNIT) PO TABS
5000.0000 [IU] | ORAL_TABLET | Freq: Every day | ORAL | Status: DC
  Administered 2019-05-06 – 2019-05-07 (×2): 5000 [IU] via ORAL
  Filled 2019-05-05 (×3): qty 5

## 2019-05-05 NOTE — ED Notes (Signed)
ED TO INPATIENT HANDOFF REPORT  ED Nurse Name and Phone #: [phone]  S Name/Age/Gender Morgan [name] 73 y.o. female Room/Bed: 026C/026C  Code Status   Code Status: Not on file  Home/SNF/Other Home Patient oriented to: self, place, time and situation Is this baseline? Yes   Triage Complete: Triage complete  Chief Complaint shob  Triage Note Pt reports SHOB x 2-3 weeks. Pt reports diagnosed with bronchitis and started antibiotics on Friday. Pt reports she also took her husbands antibiotic. Pt reports worsening SHOB since Saturday. Pt reports using an albuterol inhaler every 2 hours with minimal relief. Pt swabbed for COVID on 7/24 with no result. Denies COVID contact, or fever. Pt reports cough.    Allergies Allergies  Allergen Reactions  . Hctz [Hydrochlorothiazide] Other (See Comments)    Hyponatremia   . Amlodipine Swelling    Ankle/leg swelling  . Azithromycin Nausea And Vomiting  . Codeine Itching    Makes me crazy    Level of Care/Admitting Diagnosis ED Disposition    ED Disposition Condition [facility] Area: [place] [[id number]]  Level of Care: Telemetry Medical [104]  Covid Evaluation: Confirmed COVID Negative  Diagnosis: COPD exacerbation ([place]) [[id number]]  Admitting Physician: Elwyn Reach [[id number]]  Attending Physician: Elwyn Reach [[id number]]  Estimated length of stay: past midnight tomorrow  Certification:: I certify this patient will need inpatient services for at least 2 midnights  PT Class (Do Not Modify): Inpatient [101]  PT Acc Code (Do Not Modify): Private [1]       B Medical/Surgery History Past Medical History:  Diagnosis Date  . COPD (chronic obstructive pulmonary disease) ([place])   . GERD (gastroesophageal reflux disease)   . Hyperlipidemia   . Hypertension   . Seasonal allergies    Past Surgical History:  Procedure Laterality Date  . BACK SURGERY       A IV Location/Drains/Wounds Patient  Lines/Drains/Airways Status   Active Line/Drains/Airways    Name:   Placement date:   Placement time:   Site:   Days:   Peripheral IV 05/05/19 Left Antecubital   05/05/19    1312    Antecubital   less than 1   Incision (Closed) 12/23/15 Back Medial   12/23/15    1002     1229          Intake/Output Last 24 hours  Intake/Output Summary (Last 24 hours) at 05/05/2019 1834 Last data filed at 05/05/2019 1440 Gross per 24 hour  Intake 50 ml  Output -  Net 50 ml    Labs/Imaging Results for orders placed or performed during the hospital encounter of 05/05/19 (from the past 48 hour(s))  CBC with Differential/Platelet     Status: Abnormal   Collection Time: 05/05/19 12:38 PM  Result Value Ref Range   WBC 9.3 4.0 - 10.5 K/uL   RBC 3.86 (L) 3.87 - 5.11 MIL/uL   Hemoglobin 12.4 12.0 - 15.0 g/dL   HCT 37.1 36.0 - 46.0 %   MCV 96.1 80.0 - 100.0 fL   MCH 32.1 26.0 - 34.0 pg   MCHC 33.4 30.0 - 36.0 g/dL   RDW 12.8 11.5 - 15.5 %   Platelets 374 150 - 400 K/uL   nRBC 0.0 0.0 - 0.2 %   Neutrophils Relative % 51 %   Neutro Abs 4.7 1.7 - 7.7 K/uL   Lymphocytes Relative 22 %   Lymphs Abs 2.1 0.7 - 4.0 K/uL  Monocytes Relative 12 %   Monocytes Absolute 1.1 (H) 0.1 - 1.0 K/uL   Eosinophils Relative 13 %   Eosinophils Absolute 1.2 (H) 0.0 - 0.5 K/uL   Basophils Relative 2 %   Basophils Absolute 0.1 0.0 - 0.1 K/uL   Immature Granulocytes 0 %   Abs Immature Granulocytes 0.03 0.00 - 0.07 K/uL    Comment: Performed at [facility] [address], [place], [place] [address]  Comprehensive metabolic panel     Status: Abnormal   Collection Time: 05/05/19 12:38 PM  Result Value Ref Range   Sodium 134 (L) 135 - 145 mmol/L   Potassium 4.1 3.5 - 5.1 mmol/L   Chloride 100 98 - 111 mmol/L   CO2 22 22 - 32 mmol/L   Glucose, Bld 125 (H) 70 - 99 mg/dL   BUN 11 8 - 23 mg/dL   Creatinine, Ser 0.72 0.44 - 1.00 mg/dL   Calcium 9.3 8.9 - 10.3 mg/dL   Total Protein 7.2 6.5 - 8.1 g/dL   Albumin 4.5  3.5 - 5.0 g/dL   AST 27 15 - 41 U/L   ALT 16 0 - 44 U/L   Alkaline Phosphatase 61 38 - 126 U/L   Total Bilirubin 0.8 0.3 - 1.2 mg/dL   GFR calc non Af Amer >60 >60 mL/min   GFR calc Af Amer >60 >60 mL/min   Anion gap 12 5 - 15    Comment: Performed at [facility], [place] [address], [place], [place] [address]  SARS Coronavirus 2 (CEPHEID - Performed in [place] hospital lab), Hosp Order     Status: None   Collection Time: 05/05/19 12:38 PM   Specimen: Nasopharyngeal Swab  Result Value Ref Range   SARS Coronavirus 2 NEGATIVE NEGATIVE    Comment: (NOTE) If result is NEGATIVE SARS-CoV-2 target nucleic acids are NOT DETECTED. The SARS-CoV-2 RNA is generally detectable in upper and lower  respiratory specimens during the acute phase of infection. The lowest  concentration of SARS-CoV-2 viral copies this assay can detect is 250  copies / mL. A negative result does not preclude SARS-CoV-2 infection  and should not be used as the sole basis for treatment or other  patient management decisions.  A negative result may occur with  improper specimen collection / handling, submission of specimen other  than nasopharyngeal swab, presence of viral mutation(s) within the  areas targeted by this assay, and inadequate number of viral copies  (<250 copies / mL). A negative result must be combined with clinical  observations, patient history, and epidemiological information. If result is POSITIVE SARS-CoV-2 target nucleic acids are DETECTED. The SARS-CoV-2 RNA is generally detectable in upper and lower  respiratory specimens dur ing the acute phase of infection.  Positive  results are indicative of active infection with SARS-CoV-2.  Clinical  correlation with patient history and other diagnostic information is  necessary to determine patient infection status.  Positive results do  not rule out bacterial infection or co-infection with other viruses. If result is PRESUMPTIVE POSTIVE SARS-CoV-2  nucleic acids MAY BE PRESENT.   A presumptive positive result was obtained on the submitted specimen  and confirmed on repeat testing.  While 2019 novel coronavirus  (SARS-CoV-2) nucleic acids may be present in the submitted sample  additional confirmatory testing may be necessary for epidemiological  and / or clinical management purposes  to differentiate between  SARS-CoV-2 and other Sarbecovirus currently known to infect humans.  If clinically  indicated additional testing with an alternate test  methodology [phone]) is advised. The SARS-CoV-2 RNA is generally  detectable in upper and lower respiratory sp ecimens during the acute  phase of infection. The expected result is Negative. Fact Sheet for Patients:  StrictlyIdeas.no Fact Sheet for Healthcare Providers: BankingDealers.co.za This test is not yet approved or cleared by the [place] FDA and has been authorized for detection and/or diagnosis of SARS-CoV-2 by FDA under an Emergency Use Authorization (EUA).  This EUA will remain in effect (meaning this test can be used) for the duration of the COVID-19 declaration under Section 564(b)(1) of the Act, 21 U.S.C. section 360bbb-3(b)(1), unless the authorization is terminated or revoked sooner. Performed at [facility], [place] [address], [place], [place] [address]   POCT I-Stat EG7     Status: Abnormal   Collection Time: 05/05/19 12:51 PM  Result Value Ref Range   pH, Ven 7.369 7.250 - 7.430   pCO2, Ven 40.8 (L) 44.0 - 60.0 mmHg   pO2, Ven 46.0 (H) 32.0 - 45.0 mmHg   Bicarbonate 23.5 20.0 - 28.0 mmol/L   TCO2 25 22 - 32 mmol/L   O2 Saturation 80.0 %   Acid-base deficit 2.0 0.0 - 2.0 mmol/L   Sodium 133 (L) 135 - 145 mmol/L   Potassium 4.1 3.5 - 5.1 mmol/L   Calcium, Ion 1.12 (L) 1.15 - 1.40 mmol/L   HCT 40.0 36.0 - 46.0 %   Hemoglobin 13.6 12.0 - 15.0 g/dL   Patient temperature HIDE    Sample type VENOUS    Dg Chest  Port 1 View  Result Date: 05/05/2019 CLINICAL DATA:  Shortness of breath and cough EXAM: PORTABLE CHEST 1 VIEW COMPARISON:  October 30, 2016 FINDINGS: There is mild scarring in the right base. There is no edema or consolidation. Heart size and pulmonary vascularity are normal. No adenopathy. There is aortic atherosclerosis. There is thoracic levoscoliosis as well as thoracolumbar dextroscoliosis. There is postoperative change in the lower cervical region. IMPRESSION: Mild scarring right base. No edema or consolidation. Stable cardiac silhouette. Aortic Atherosclerosis (ICD10-I70.0). Electronically Signed   By: Lowella Grip III M.D.   On: 05/05/2019 13:01    Pending Labs [employer] (From admission, onward)    Start     Ordered   Signed and Held  HIV antibody  Once,   R     Signed and Held   Signed and Held  CBC  (enoxaparin (LOVENOX)    CrCl >/= 30 ml/min)  Once,   R    Comments: Baseline for enoxaparin therapy IF NOT ALREADY DRAWN.  Notify MD if PLT < 100 K.    Signed and Held   Signed and Held  Creatinine, serum  (enoxaparin (LOVENOX)    CrCl >/= 30 ml/min)  Once,   R    Comments: Baseline for enoxaparin therapy IF NOT ALREADY DRAWN.    Signed and Held   Signed and Held  Creatinine, serum  (enoxaparin (LOVENOX)    CrCl >/= 30 ml/min)  Weekly,   R    Comments: while on enoxaparin therapy    Signed and Held   Signed and Held  Basic metabolic panel  Tomorrow morning,   R     Signed and Held   Signed and Held  Comprehensive metabolic panel  Tomorrow morning,   R     Signed and Held          Vitals/Pain Today's Vitals   05/05/19 1500  05/05/19 1530 05/05/19 1536 05/05/19 1657  BP: (!) 144/73 (!) 134/108    Pulse: 80 80    Resp: (!) 23 18    Temp:      TempSrc:      SpO2: 96% 98%    PainSc:   0-No pain 0-No pain    Isolation Precautions No active isolations  Medications Medications  albuterol (VENTOLIN HFA) 108 (90 Base) MCG/ACT inhaler 8 puff (8 puffs Inhalation  Given 05/05/19 1245)  methylPREDNISolone sodium succinate (SOLU-MEDROL) 125 mg/2 mL injection 125 mg (125 mg Intravenous Given 05/05/19 1305)  magnesium sulfate IVPB 2 g 50 mL (0 g Intravenous Stopped 05/05/19 1440)  ipratropium (ATROVENT HFA) inhaler 4 puff (4 puffs Inhalation Given 05/05/19 1343)  AeroChamber Plus Flo-Vu Large MISC (  Given 05/05/19 1245)  ipratropium (ATROVENT) nebulizer solution 0.5 mg (0.5 mg Nebulization Given 05/05/19 1618)  albuterol (PROVENTIL,VENTOLIN) solution continuous neb (15 mg/hr Nebulization Given 05/05/19 1618)    Mobility walks with person assist Low fall risk   Focused Assessments  , Pulmonary Assessment Handoff:  Lung sounds: Bilateral Breath Sounds: Inspiratory wheezes, Expiratory wheezes O2 Device: Nasal Cannula O2 Flow Rate (L/min): 2 L/min      R Recommendations: See Admitting Provider Note  Report given to:   Additional Notes:

## 2019-05-05 NOTE — ED Triage Notes (Signed)
Pt reports SHOB x 2-3 weeks. Pt reports diagnosed with bronchitis and started antibiotics on Friday. Pt reports she also took her husbands antibiotic. Pt reports worsening SHOB since Saturday. Pt reports using an albuterol inhaler every 2 hours with minimal relief. Pt swabbed for COVID on 7/24 with no result. Denies COVID contact, or fever. Pt reports cough.

## 2019-05-05 NOTE — H&P (Signed)
History and Physical   Morgan [name] QQP:[id number] DOB: 04-12-1946 DOA: 05/05/2019  Referring MD/NP/PA: Dr. Maryan Rued  PCP: [name], NP   Outpatient Specialists: None  Patient coming from: Home  Chief Complaint: Shortness of breath  HPI: Morgan [name] is a 73 y.o. female with medical history significant of COPD, GERD, hypertension, seasonal allergies, who quit smoking in December of last year and has never been hospitalized for COPD but started having progressive shortness of breath wheezing and cough in the last 2 days.  Patient has inhalers at home that she try but no relief.  She came to the ER in acute respiratory failure with hypoxia.  Patient was struggling to breathe in obvious distress.  Patient denies any exposure to patient was COVID-19.  She denies any heart disease.  She tried antibiotics at home including doxycycline and amoxicillin from her husband.  Symptoms continue to get worse.  She is being admitted with acute exacerbation of COPD.Marland Kitchen  ED Course: Temperature 98.2 blood pressure 1 5584 pulse 93 respiratory of 32 oxygen sat 86% room air and 97% on 4 L.  ABG showed a pH of 7.36 PCO2 of 40 and PO2 of 46.  Sodium 134 otherwise chemistry and CBC appear to be all within normal.  COVID-19 testing is negative.  Chest x-ray shows mild scarring at the right base no edema or consolidation.  Patient has been given steroids with breathing treatment in the ER and will be admitted for treatment.  Review of Systems: As per HPI otherwise 10 point review of systems negative.    Past Medical History:  Diagnosis Date  . COPD (chronic obstructive pulmonary disease) ([place])   . GERD (gastroesophageal reflux disease)   . Hyperlipidemia   . Hypertension   . Seasonal allergies     Past Surgical History:  Procedure Laterality Date  . BACK SURGERY       reports that she quit smoking about 7 months ago. Her smoking use included e-cigarettes. She has a 56.00 pack-year smoking history.  She has never used smokeless tobacco. She reports current alcohol use. She reports that she does not use drugs.  Allergies  Allergen Reactions  . Hctz [Hydrochlorothiazide] Other (See Comments)    Hyponatremia   . Amlodipine Swelling  . Azithromycin Nausea And Vomiting  . Codeine Itching    Makes me crazy    Family History  Problem Relation Age of Onset  . Heart disease Father        before age 72     Prior to Admission medications   Medication Sig Start Date End Date Taking? Authorizing Provider  albuterol (PROAIR HFA) 108 (90 Base) MCG/ACT inhaler Inhale 2 puffs into the lungs every 4 (four) hours as needed for wheezing or shortness of breath. 05/01/19   Lesleigh Noe, MD  aspirin EC 81 MG tablet Take 81 mg by mouth daily.    [provider]  Cholecalciferol (D-3-5) 5000 UNITS capsule Take 5,000 Units by mouth daily.    [provider]  doxycycline (VIBRA-TABS) 100 MG tablet Take 1 tablet (100 mg total) by mouth 2 (two) times daily for 5 days. 05/01/19 05/06/19  Lesleigh Noe, MD  hydrALAZINE (APRESOLINE) 50 MG tablet TAKE 1/2 TABLET EVERY MORNING AND 1 FULL TABLET IN THE EVENING FOR BLOOD PRESSURE. 04/24/19   [name], NP  ibuprofen (ADVIL,MOTRIN) 200 MG tablet Take 400-600 mg by mouth every 8 (eight) hours as needed for mild pain or moderate pain.  [provider]  irbesartan (AVAPRO) 300 MG tablet TAKE 1 TABLET (300 MG TOTAL) BY MOUTH DAILY. FOR BLOOD PRESSURE. 03/31/19   [name], NP  metoprolol succinate (TOPROL-XL) 25 MG 24 hr tablet Take 1 tablet (25 mg total) by mouth daily. For blood pressure. 12/30/18   [name], NP  Multiple Vitamins-Minerals (MULTIVITAMIN GUMMIES ADULTS) CHEW Chew 2 each by mouth daily.    [provider]  omeprazole (PRILOSEC) 20 MG capsule Take 1 capsule (20 mg total) by mouth daily. For heartburn. 08/09/18   [name], NP  polyethylene glycol powder (GLYCOLAX/MIRALAX) powder Mix  17 g into water once to twice daily as needed for constipation. 10/19/17   [name], NP  simvastatin (ZOCOR) 40 MG tablet Take 1 tablet (40 mg total) by mouth every evening. For cholesterol. 12/30/18   [name], NP  venlafaxine XR (EFFEXOR-XR) 75 MG 24 hr capsule Take 1 capsule (75 mg total) by mouth daily with breakfast. For anxiety/depression. 11/28/18   [name], NP  vitamin C (ASCORBIC ACID) 500 MG tablet Take 500 mg by mouth daily.    [provider]    Physical Exam: Vitals:   05/05/19 1400 05/05/19 1415 05/05/19 1445 05/05/19 1500  BP: (!) 143/68 (!) 155/70 131/69 (!) 144/73  Pulse: 75 85 85 80  Resp: (!) 24 16 19  (!) 23  Temp:      TempSrc:      SpO2: 96% 97% 96% 96%      Constitutional: Cachectic, in obvious respiratory distress Vitals:   05/05/19 1400 05/05/19 1415 05/05/19 1445 05/05/19 1500  BP: (!) 143/68 (!) 155/70 131/69 (!) 144/73  Pulse: 75 85 85 80  Resp: (!) 24 16 19  (!) 23  Temp:      TempSrc:      SpO2: 96% 97% 96% 96%   Eyes: PERRL, lids and conjunctivae normal ENMT: Mucous membranes are moist. Posterior pharynx clear of any exudate or lesions.Normal dentition.  Neck: normal, supple, no masses, no thyromegaly Respiratory: Patient in respiratory distress, marked expiratory wheezing was poor air entry bilaterally, use of extra muscle respiration. Cardiovascular: Sinus tachycardia: No murmurs / rubs / gallops. No extremity edema. 2+ pedal pulses. No carotid bruits.  Abdomen: no tenderness, no masses palpated. No hepatosplenomegaly. Bowel sounds positive.  Musculoskeletal: no clubbing / cyanosis. No joint deformity upper and lower extremities. Good ROM, no contractures. Normal muscle tone.  Skin: no rashes, lesions, ulcers. No induration Neurologic: CN 2-12 grossly intact. Sensation intact, DTR normal. Strength 5/5 in all 4.  Psychiatric: Normal judgment and insight. Alert and oriented x 3. Normal mood.     Labs on  Admission: I have personally reviewed following labs and imaging studies  CBC: Recent Labs  Lab 05/05/19 1238 05/05/19 1251  WBC 9.3  --   NEUTROABS 4.7  --   HGB 12.4 13.6  HCT 37.1 40.0  MCV 96.1  --   PLT 374  --    Basic Metabolic Panel: Recent Labs  Lab 05/05/19 1238 05/05/19 1251  NA 134* 133*  K 4.1 4.1  CL 100  --   CO2 22  --   GLUCOSE 125*  --   BUN 11  --   CREATININE 0.72  --   CALCIUM 9.3  --    GFR: Estimated Creatinine Clearance: 54.4 mL/min (by C-G formula based on SCr of 0.72 mg/dL). Liver Function Tests: Recent Labs  Lab 05/05/19 1238  AST 27  ALT  16  ALKPHOS 61  BILITOT 0.8  PROT 7.2  ALBUMIN 4.5   No results for input(s): LIPASE, AMYLASE in the last 168 hours. No results for input(s): AMMONIA in the last 168 hours. Coagulation Profile: No results for input(s): INR, PROTIME in the last 168 hours. Cardiac Enzymes: No results for input(s): CKTOTAL, CKMB, CKMBINDEX, TROPONINI in the last 168 hours. BNP (last 3 results) No results for input(s): PROBNP in the last 8760 hours. HbA1C: No results for input(s): HGBA1C in the last 72 hours. CBG: No results for input(s): GLUCAP in the last 168 hours. Lipid Profile: No results for input(s): CHOL, HDL, LDLCALC, TRIG, CHOLHDL, LDLDIRECT in the last 72 hours. Thyroid Function Tests: No results for input(s): TSH, T4TOTAL, FREET4, T3FREE, THYROIDAB in the last 72 hours. Anemia Panel: No results for input(s): VITAMINB12, FOLATE, FERRITIN, TIBC, IRON, RETICCTPCT in the last 72 hours. Urine analysis:    Component Value Date/Time   BILIRUBINUR negative 09/08/2015 1156   PROTEINUR +- 09/08/2015 1156   UROBILINOGEN negative 09/08/2015 1156   NITRITE negative 09/08/2015 1156   LEUKOCYTESUR Trace (A) 09/08/2015 1156   Sepsis Labs: @LABRCNTIP (procalcitonin:4,lacticidven:4) ) Recent Results (from the past 240 hour(s))  SARS Coronavirus 2 (CEPHEID - Performed in [facility] hospital lab), Hosp Order      Status: None   Collection Time: 05/05/19 12:38 PM   Specimen: Nasopharyngeal Swab  Result Value Ref Range Status   SARS Coronavirus 2 NEGATIVE NEGATIVE Final    Comment: (NOTE) If result is NEGATIVE SARS-CoV-2 target nucleic acids are NOT DETECTED. The SARS-CoV-2 RNA is generally detectable in upper and lower  respiratory specimens during the acute phase of infection. The lowest  concentration of SARS-CoV-2 viral copies this assay can detect is 250  copies / mL. A negative result does not preclude SARS-CoV-2 infection  and should not be used as the sole basis for treatment or other  patient management decisions.  A negative result may occur with  improper specimen collection / handling, submission of specimen other  than nasopharyngeal swab, presence of viral mutation(s) within the  areas targeted by this assay, and inadequate number of viral copies  (<250 copies / mL). A negative result must be combined with clinical  observations, patient history, and epidemiological information. If result is POSITIVE SARS-CoV-2 target nucleic acids are DETECTED. The SARS-CoV-2 RNA is generally detectable in upper and lower  respiratory specimens dur ing the acute phase of infection.  Positive  results are indicative of active infection with SARS-CoV-2.  Clinical  correlation with patient history and other diagnostic information is  necessary to determine patient infection status.  Positive results do  not rule out bacterial infection or co-infection with other viruses. If result is PRESUMPTIVE POSTIVE SARS-CoV-2 nucleic acids MAY BE PRESENT.   A presumptive positive result was obtained on the submitted specimen  and confirmed on repeat testing.  While 2019 novel coronavirus  (SARS-CoV-2) nucleic acids may be present in the submitted sample  additional confirmatory testing may be necessary for epidemiological  and / or clinical management purposes  to differentiate between  SARS-CoV-2 and other  Sarbecovirus currently known to infect humans.  If clinically indicated additional testing with an alternate test  methodology [phone]) is advised. The SARS-CoV-2 RNA is generally  detectable in upper and lower respiratory sp ecimens during the acute  phase of infection. The expected result is Negative. Fact Sheet for Patients:  StrictlyIdeas.no Fact Sheet for Healthcare Providers: BankingDealers.co.za This test is not yet approved or cleared  by the [place] and has been authorized for detection and/or diagnosis of SARS-CoV-2 by FDA under an Emergency Use Authorization (EUA).  This EUA will remain in effect (meaning this test can be used) for the duration of the COVID-19 declaration under Section 564(b)(1) of the Act, 21 U.S.C. section 360bbb-3(b)(1), unless the authorization is terminated or revoked sooner. Performed at [facility], [place] [address] [place], [place] [address]      Radiological Exams on Admission: Dg Chest Port 1 View  Result Date: 05/05/2019 CLINICAL DATA:  Shortness of breath and cough EXAM: PORTABLE CHEST 1 VIEW COMPARISON:  October 30, 2016 FINDINGS: There is mild scarring in the right base. There is no edema or consolidation. Heart size and pulmonary vascularity are normal. No adenopathy. There is aortic atherosclerosis. There is thoracic levoscoliosis as well as thoracolumbar dextroscoliosis. There is postoperative change in the lower cervical region. IMPRESSION: Mild scarring right base. No edema or consolidation. Stable cardiac silhouette. Aortic Atherosclerosis (ICD10-I70.0). Electronically Signed   By: Lowella Grip III M.D.   On: 05/05/2019 13:01    EKG: Independently reviewed.  Shows normal sinus rhythm with nonspecific ST abnormalities.  No previous tracing.  Assessment/Plan Active Problems:   Hyperlipidemia   Essential hypertension   Anxiety and depression   GERD (gastroesophageal  reflux disease)   Tobacco abuse   COPD exacerbation (HCC)     #1 acute exacerbation of COPD.:  Patient will be admitted for COPD exacerbation.  She seems to be in acute respiratory failure.  No previous admission for COPD.  We will start IV steroids with breathing treatments and antibiotics.  Follow the gold COPD protocol.  #2 hypertension: Continue with home regimen of blood pressure medications.  #3 anxiety disorder: Continue home regimen.  Patient counseled.  #4 hyperlipidemia: Continue home regimen.  #5 GERD: Continue PPIs  #6 history of tobacco abuse: Patient says she quit in December.  Counseling provided to maintain her off tobacco.   DVT prophylaxis: Lovenox Code Status: Full code Family Communication: No family at bedside Disposition Plan: Home Consults called: None Admission status: Inpatient  Severity of Illness: The appropriate patient status for this patient is INPATIENT. Inpatient status is judged to be reasonable and necessary in order to provide the required intensity of service to ensure the patient's safety. The patient's presenting symptoms, physical exam findings, and initial radiographic and laboratory data in the context of their chronic comorbidities is felt to place them at high risk for further clinical deterioration. Furthermore, it is not anticipated that the patient will be medically stable for discharge from the hospital within 2 midnights of admission. The following factors support the patient status of inpatient.   " The patient's presenting symptoms include shortness of breath. " The worrisome physical exam findings include marked expiratory wheezing. " The initial radiographic and laboratory data are worrisome because of no finding on chest x-ray. " The chronic co-morbidities include chronic tobacco abuse.   * I certify that at the point of admission it is my clinical judgment that the patient will require inpatient hospital care spanning beyond 2  midnights from the point of admission due to high intensity of service, high risk for further deterioration and high frequency of surveillance required.Barbette Merino MD [facility] Pager [phone]  If 7PM-7AM, please contact night-coverage [email or web address] Password [facility]  05/05/2019, 3:35 PM

## 2019-05-05 NOTE — ED Provider Notes (Signed)
[facility] EMERGENCY DEPARTMENT Provider Note   CSN: [id number] Arrival date & time: 05/05/19  1133     History   Chief Complaint Chief Complaint  Patient presents with   Shortness of Breath    HPI Morgan [name] is a 73 y.o. female.     The history is provided by the patient.  Shortness of Breath Severity:  Severe Onset quality:  Gradual Duration:  3 days Timing:  Constant Progression:  Worsening Chronicity:  New Context: URI   Relieved by:  Nothing Worsened by:  Activity and coughing Ineffective treatments:  Inhaler and rest (abx) Associated symptoms: cough and wheezing   Associated symptoms: no abdominal pain, no fever, no headaches, no sore throat, no sputum production and no vomiting   Risk factors comment:  Stopped smoking 12/19. hx of COPD, htn and gerd   Past Medical History:  Diagnosis Date   COPD (chronic obstructive pulmonary disease) ([place])    GERD (gastroesophageal reflux disease)    Hyperlipidemia    Hypertension    Seasonal allergies     Patient Active Problem List   Diagnosis Date Noted   Prediabetes 03/05/2019   Abdominal wall bulge 12/16/2018   Tobacco abuse 08/09/2018   COPD (chronic obstructive pulmonary disease) ([place]) 08/09/2018   Osteoporosis 08/09/2018   Medicare annual wellness visit, subsequent 10/11/2016   Constipation 06/28/2016   Back pain 10/28/2015   Atherosclerosis of aorta ([place]) 09/17/2015   Hyperlipidemia 09/08/2015   Essential hypertension 09/08/2015   Anxiety and depression 09/08/2015   GERD (gastroesophageal reflux disease) 09/08/2015    Past Surgical History:  Procedure Laterality Date   BACK SURGERY       OB History   No obstetric history on file.      Home Medications    Prior to Admission medications   Medication Sig Start Date End Date Taking? Authorizing Provider  albuterol (PROAIR HFA) 108 (90 Base) MCG/ACT inhaler Inhale 2 puffs into the lungs every 4 (four)  hours as needed for wheezing or shortness of breath. 05/01/19   Lesleigh Noe, MD  aspirin EC 81 MG tablet Take 81 mg by mouth daily.    [provider]  Cholecalciferol (D-3-5) 5000 UNITS capsule Take 5,000 Units by mouth daily.    [provider]  doxycycline (VIBRA-TABS) 100 MG tablet Take 1 tablet (100 mg total) by mouth 2 (two) times daily for 5 days. 05/01/19 05/06/19  Lesleigh Noe, MD  hydrALAZINE (APRESOLINE) 50 MG tablet TAKE 1/2 TABLET EVERY MORNING AND 1 FULL TABLET IN THE EVENING FOR BLOOD PRESSURE. 04/24/19   [name], NP  ibuprofen (ADVIL,MOTRIN) 200 MG tablet Take 400-600 mg by mouth every 8 (eight) hours as needed for mild pain or moderate pain.    [provider]  irbesartan (AVAPRO) 300 MG tablet TAKE 1 TABLET (300 MG TOTAL) BY MOUTH DAILY. FOR BLOOD PRESSURE. 03/31/19   [name], NP  metoprolol succinate (TOPROL-XL) 25 MG 24 hr tablet Take 1 tablet (25 mg total) by mouth daily. For blood pressure. 12/30/18   [name], NP  Multiple Vitamins-Minerals (MULTIVITAMIN GUMMIES ADULTS) CHEW Chew 2 each by mouth daily.    [provider]  omeprazole (PRILOSEC) 20 MG capsule Take 1 capsule (20 mg total) by mouth daily. For heartburn. 08/09/18   [name], NP  polyethylene glycol powder (GLYCOLAX/MIRALAX) powder Mix 17 g into water once to twice daily as needed for constipation. 10/19/17   Alma Friendly  K, NP  simvastatin (ZOCOR) 40 MG tablet Take 1 tablet (40 mg total) by mouth every evening. For cholesterol. 12/30/18   [name], NP  venlafaxine XR (EFFEXOR-XR) 75 MG 24 hr capsule Take 1 capsule (75 mg total) by mouth daily with breakfast. For anxiety/depression. 11/28/18   [name], NP  vitamin C (ASCORBIC ACID) 500 MG tablet Take 500 mg by mouth daily.    [provider]    Family History Family History  Problem Relation Age of Onset   Heart disease Father        before age 39     Social History Social History   Tobacco Use   Smoking status: Former Smoker    Packs/day: 1.00    Years: 56.00    Pack years: 56.00    Types: E-cigarettes    Quit date: 09/08/2018    Years since quitting: 0.6   Smokeless tobacco: Never Used   Tobacco comment: stopped cigs Dec 2019  Substance Use Topics   Alcohol use: Yes    Alcohol/week: 0.0 standard drinks    Comment: 2 to 3 beer a day   Drug use: No     Allergies   Hctz [hydrochlorothiazide], Amlodipine, Azithromycin, and Codeine   Review of Systems Review of Systems  Constitutional: Negative for fever.  HENT: Negative for sore throat.   Respiratory: Positive for cough, shortness of breath and wheezing. Negative for sputum production.   Gastrointestinal: Negative for abdominal pain and vomiting.  Neurological: Negative for headaches.  All other systems reviewed and are negative.    Physical Exam Updated Vital Signs BP (!) 144/90    Pulse 93    Temp 98.2 F (36.8 C) (Oral)    Resp (!) 24    SpO2 (!) 86%   Physical Exam Vitals signs and nursing note reviewed.  Constitutional:      General: She is in acute distress.     Appearance: She is well-developed and normal weight.  HENT:     Head: Normocephalic and atraumatic.  Eyes:     Conjunctiva/sclera: Conjunctivae normal.     Pupils: Pupils are equal, round, and reactive to light.  Neck:     Musculoskeletal: Normal range of motion and neck supple.  Cardiovascular:     Rate and Rhythm: Normal rate and regular rhythm.     Heart sounds: No murmur.  Pulmonary:     Effort: Tachypnea and accessory muscle usage present. No respiratory distress.     Breath sounds: Wheezing present. No rales.     Comments: Pursed lip breathing Abdominal:     General: There is no distension.     Palpations: Abdomen is soft.     Tenderness: There is no abdominal tenderness. There is no guarding or rebound.  Musculoskeletal: Normal range of motion.        General: No  tenderness.  Skin:    General: Skin is warm and dry.     Findings: No erythema or rash.  Neurological:     Mental Status: She is alert and oriented to person, place, and time.  Psychiatric:        Behavior: Behavior normal.      ED Treatments / Results  Labs (all labs ordered are listed, but only abnormal results are displayed) Labs Reviewed  CBC WITH DIFFERENTIAL/PLATELET - Abnormal; Notable for the following components:      Result Value   RBC 3.86 (*)    Monocytes Absolute 1.1 (*)  Eosinophils Absolute 1.2 (*)    All other components within normal limits  COMPREHENSIVE METABOLIC PANEL - Abnormal; Notable for the following components:   Sodium 134 (*)    Glucose, Bld 125 (*)    All other components within normal limits  POCT I-STAT EG7 - Abnormal; Notable for the following components:   pCO2, Ven 40.8 (*)    pO2, Ven 46.0 (*)    Sodium 133 (*)    Calcium, Ion 1.12 (*)    All other components within normal limits  SARS CORONAVIRUS 2 (HOSPITAL ORDER, [facility])    EKG EKG Interpretation  Date/Time:  Monday May 05 2019 11:58:00 EDT Ventricular Rate:  86 PR Interval:  148 QRS Duration: 80 QT Interval:  374 QTC Calculation: 447 R Axis:   86 Text Interpretation:  Normal sinus rhythm Nonspecific ST abnormality No previous tracing Confirmed by Blanchie Dessert [phone]) on 05/05/2019 12:20:05 PM   Radiology Dg Chest Port 1 View  Result Date: 05/05/2019 CLINICAL DATA:  Shortness of breath and cough EXAM: PORTABLE CHEST 1 VIEW COMPARISON:  October 30, 2016 FINDINGS: There is mild scarring in the right base. There is no edema or consolidation. Heart size and pulmonary vascularity are normal. No adenopathy. There is aortic atherosclerosis. There is thoracic levoscoliosis as well as thoracolumbar dextroscoliosis. There is postoperative change in the lower cervical region. IMPRESSION: Mild scarring right base. No edema or consolidation. Stable  cardiac silhouette. Aortic Atherosclerosis (ICD10-I70.0). Electronically Signed   By: Lowella Grip III M.D.   On: 05/05/2019 13:01    Procedures Procedures (including critical care time)  Medications Ordered in ED Medications  methylPREDNISolone sodium succinate (SOLU-MEDROL) 125 mg/2 mL injection 125 mg (has no administration in time range)  magnesium sulfate IVPB 2 g 50 mL (has no administration in time range)  albuterol (VENTOLIN HFA) 108 (90 Base) MCG/ACT inhaler 8 puff (has no administration in time range)  ipratropium (ATROVENT HFA) inhaler 4 puff (has no administration in time range)  albuterol (VENTOLIN HFA) 108 (90 Base) MCG/ACT inhaler (has no administration in time range)     Initial Impression / Assessment and Plan / ED Course  I have reviewed the triage vital signs and the nursing notes.  Pertinent labs & imaging results that were available during my care of the patient were reviewed by me and considered in my medical decision making (see chart for details).       Patient is a 72 year old female presenting today with shortness of breath, diffuse wheezing and hypoxia.  Patient does have a history of COPD but states she does not use inhalers regularly at home.  She was diagnosed with bronchitis on Friday and started on doxycycline but states she took 2 doses of her husband's amoxicillin 2 days before that.  Saturday was a bad day but she felt a little better yesterday and then became abruptly worse overnight.  Patient states she has never felt this bad before.  She does not use oxygen at home.  Stop smoking on 12/19.  Patient denies any chest or abdominal pain.  She has no lower extremity swelling or prior heart issues.  Concern for COPD exacerbation versus COVID.  Lower suspicion for CHF, dissection or PE.  Patient given albuterol, Atrovent, Solu-Medrol and magnesium.  X-rays and labs are pending.  Will reevaluate.  With 2 L of oxygen patient is satting 96%  3:09 PM Chest  x-rays were reassuring without any evidence of infection or fluid.  Patient's  VBG without acute findings, CBC, CMP and COVID testing all within normal limits.  After Solu-Medrol, magnesium and 2 rounds of albuterol as an inhaler patient's work of breathing has improved but she still has diffuse wheezing.  Patient was started on a an hour-long continuous neb with additional Atrovent.  Feel that she will need to be admitted for COPD exacerbation.  At this time she does not require BiPAP  CRITICAL CARE Performed by: Verlin Duke Total critical care time: 30 minutes Critical care time was exclusive of separately billable procedures and treating other patients. Critical care was necessary to treat or prevent imminent or life-threatening deterioration. Critical care was time spent personally by me on the following activities: development of treatment plan with patient and/or surrogate as well as nursing, discussions with consultants, evaluation of patient's response to treatment, examination of patient, obtaining history from patient or surrogate, ordering and performing treatments and interventions, ordering and review of laboratory studies, ordering and review of radiographic studies, pulse oximetry and re-evaluation of patient's condition.   Final Clinical Impressions(s) / ED Diagnoses   Final diagnoses:  COPD exacerbation [facility])    ED Discharge Orders    None       Blanchie Dessert, MD 05/05/19 1514

## 2019-05-06 ENCOUNTER — Encounter: Payer: Self-pay | Admitting: Family Medicine

## 2019-05-06 LAB — URINALYSIS, ROUTINE W REFLEX MICROSCOPIC
Bacteria, UA: NONE SEEN
Bilirubin Urine: NEGATIVE
Glucose, UA: 50 mg/dL — AB
Hgb urine dipstick: NEGATIVE
Ketones, ur: NEGATIVE mg/dL
Nitrite: NEGATIVE
Protein, ur: NEGATIVE mg/dL
Specific Gravity, Urine: 1.013 (ref 1.005–1.030)
pH: 6 (ref 5.0–8.0)

## 2019-05-06 LAB — COMPREHENSIVE METABOLIC PANEL
ALT: 15 U/L (ref 0–44)
AST: 26 U/L (ref 15–41)
Albumin: 3.8 g/dL (ref 3.5–5.0)
Alkaline Phosphatase: 56 U/L (ref 38–126)
Anion gap: 12 (ref 5–15)
BUN: 11 mg/dL (ref 8–23)
CO2: 20 mmol/L — ABNORMAL LOW (ref 22–32)
Calcium: 8.6 mg/dL — ABNORMAL LOW (ref 8.9–10.3)
Chloride: 101 mmol/L (ref 98–111)
Creatinine, Ser: 0.71 mg/dL (ref 0.44–1.00)
GFR calc Af Amer: >60 mL/min (ref >60)
GFR calc non Af Amer: >60 mL/min (ref >60)
Glucose, Bld: 200 mg/dL — ABNORMAL HIGH (ref 70–99)
Potassium: 3.4 mmol/L — ABNORMAL LOW (ref 3.5–5.1)
Sodium: 133 mmol/L — ABNORMAL LOW (ref 135–145)
Total Bilirubin: 0.3 mg/dL (ref 0.3–1.2)
Total Protein: 6.1 g/dL — ABNORMAL LOW (ref 6.5–8.1)

## 2019-05-06 LAB — STREP PNEUMONIAE URINARY ANTIGEN: Strep Pneumo Urinary Antigen: NEGATIVE

## 2019-05-06 LAB — GLUCOSE, CAPILLARY
Glucose-Capillary: 215 mg/dL — ABNORMAL HIGH (ref 70–99)
Glucose-Capillary: 162 mg/dL — ABNORMAL HIGH (ref 70–99)

## 2019-05-06 LAB — HIV ANTIBODY (ROUTINE TESTING W REFLEX): HIV Screen 4th Generation wRfx: NONREACTIVE

## 2019-05-06 MED ORDER — IPRATROPIUM-ALBUTEROL 0.5-2.5 (3) MG/3ML IN SOLN
3.0000 mL | Freq: Four times a day (QID) | RESPIRATORY_TRACT | Status: DC
  Administered 2019-05-06 (×3): 3 mL via RESPIRATORY_TRACT
  Filled 2019-05-06 (×3): qty 3

## 2019-05-06 MED ORDER — ACETAMINOPHEN 325 MG PO TABS
650.0000 mg | ORAL_TABLET | Freq: Four times a day (QID) | ORAL | Status: DC | PRN
  Administered 2019-05-06: 650 mg via ORAL
  Filled 2019-05-06: qty 2

## 2019-05-06 MED ORDER — ENSURE ENLIVE PO LIQD
237.0000 mL | Freq: Two times a day (BID) | ORAL | Status: DC
  Administered 2019-05-06 – 2019-05-07 (×2): 237 mL via ORAL

## 2019-05-06 MED ORDER — IPRATROPIUM-ALBUTEROL 0.5-2.5 (3) MG/3ML IN SOLN: 3.0000 mL | RESPIRATORY_TRACT | Status: DC | PRN

## 2019-05-06 MED ORDER — POTASSIUM CHLORIDE CRYS ER 20 MEQ PO TBCR
40.0000 meq | EXTENDED_RELEASE_TABLET | Freq: Once | ORAL | Status: AC
  Administered 2019-05-06: 16:00:00 40 meq via ORAL
  Filled 2019-05-06: qty 2

## 2019-05-06 MED ORDER — INSULIN ASPART 100 UNIT/ML ~~LOC~~ SOLN
0.0000 [IU] | Freq: Three times a day (TID) | SUBCUTANEOUS | Status: DC
  Administered 2019-05-06: 3 [IU] via SUBCUTANEOUS
  Administered 2019-05-07: 2 [IU] via SUBCUTANEOUS

## 2019-05-06 MED ORDER — NICOTINE 21 MG/24HR TD PT24
21.0000 mg | MEDICATED_PATCH | Freq: Every day | TRANSDERMAL | Status: DC
  Administered 2019-05-06 – 2019-05-07 (×2): 21 mg via TRANSDERMAL
  Filled 2019-05-06 (×2): qty 1

## 2019-05-06 MED ORDER — ORAL CARE MOUTH RINSE
15.0000 mL | Freq: Two times a day (BID) | OROMUCOSAL | Status: DC
  Administered 2019-05-06: 21:00:00 15 mL via OROMUCOSAL

## 2019-05-06 NOTE — Evaluation (Signed)
Physical Therapy Evaluation Patient Details Name: Morgan [name] MRN: [id number] DOB: 02-26-1946 Today's Date: 05/06/2019   History of Present Illness  Pt is a 73 y/o female presenting with progressive SOB, wheezing and cough for 2 days. Found in acute repiratory failure with hypoxia, admitted with acute exacerbation of COPD. PMH: COPD, HTN.     Clinical Impression  Pt admitted with above diagnosis. Pt currently with functional limitations due to the deficits listed below (see PT Problem List). PTA pt active and independent. She did not require home O2. On eval, she required min guard assist transfers and ambulation 200 feet with RW. Pt on 2 L O2 throughout session with SpO2 96-100%. Mildly unsteady gait noted, corrected with RW. Suspect pt will transition back to no AD for gait prior to d/c. She does have RW at home if needed.  Pt will benefit from skilled PT to increase their independence and safety with mobility to allow discharge to the venue listed below.  PT to follow acutely. No follow up services indicated.     Follow Up Recommendations No PT follow up;Supervision - Intermittent    Equipment Recommendations  None recommended by PT    Recommendations for Other Services       Precautions / Restrictions Precautions Precautions: Other (comment) Precaution Comments: watch sats Restrictions Weight Bearing Restrictions: No      Mobility  Bed Mobility Overal bed mobility: Modified Independent             General bed mobility comments: no assist required, HOB elevated  Transfers Overall transfer level: Needs assistance Equipment used: Ambulation equipment used Transfers: Sit to/from Stand Sit to Stand: Min guard         General transfer comment: min guard for safety, increased time to stabilize initial standing balance  Ambulation/Gait Ambulation/Gait assistance: Min guard Gait Distance (Feet): 200 Feet Assistive device: Rolling walker (2 wheeled) Gait  Pattern/deviations: Step-through pattern;Decreased stride length Gait velocity: decreased Gait velocity interpretation: 1.31 - 2.62 ft/sec, indicative of limited community ambulator General Gait Details: RW utilized due to mildly unsteady gait. Ambulated on 2 L O2 with SpO2 96-100%. Max HR 121.  Stairs            Wheelchair Mobility    Modified Rankin (Stroke Patients Only)       Balance Overall balance assessment: Needs assistance Sitting-balance support: No upper extremity supported;Feet supported Sitting balance-Leahy Scale: Good     Standing balance support: No upper extremity supported;During functional activity Standing balance-Leahy Scale: Fair Standing balance comment: static stand without UE support. RW for amb.                             Pertinent Vitals/Pain Pain Assessment: No/denies pain    Home Living Family/patient expects to be discharged to:: Private residence Living Arrangements: Spouse/significant other Available Help at Discharge: Family Type of Home: House Home Access: Level entry     Home Layout: Two level;Able to live on main level with bedroom/bathroom Home Equipment: Gilford Rile - 2 wheels;Cane - single point;Toilet riser;Grab bars - tub/shower;Wheelchair - manual      Prior Function Level of Independence: Independent         Comments: driving      Hand Dominance        Extremity/Trunk Assessment   Upper Extremity Assessment Upper Extremity Assessment: Overall WFL for tasks assessed    Lower Extremity Assessment Lower Extremity Assessment: Overall WFL for tasks assessed  Cervical / Trunk Assessment Cervical / Trunk Assessment: Normal  Communication   Communication: HOH  Cognition Arousal/Alertness: Awake/alert Behavior During Therapy: WFL for tasks assessed/performed Overall Cognitive Status: Within Functional Limits for tasks assessed                                        General  Comments General comments (skin integrity, edema, etc.): SpO2 96-100% on 2 L O2, max HR 121 during amb    Exercises     Assessment/Plan    PT Assessment Patient needs continued PT services  PT Problem List Decreased mobility;Cardiopulmonary status limiting activity;Decreased activity tolerance;Decreased balance       PT Treatment Interventions DME instruction;Therapeutic activities;Gait training;Therapeutic exercise;Patient/family education;Balance training;Functional mobility training    PT Goals (Current goals can be found in the Care Plan section)  Acute Rehab PT Goals Patient Stated Goal: to get stronger, get home  PT Goal Formulation: With patient Time For Goal Achievement: 05/20/19 Potential to Achieve Goals: Good    Frequency Min 3X/week   Barriers to discharge        Co-evaluation               AM-PAC PT "6 Clicks" Mobility  Outcome Measure Help needed turning from your back to your side while in a flat bed without using bedrails?: None Help needed moving from lying on your back to sitting on the side of a flat bed without using bedrails?: None Help needed moving to and from a bed to a chair (including a wheelchair)?: A Little Help needed standing up from a chair using your arms (e.g., wheelchair or bedside chair)?: A Little Help needed to walk in hospital room?: A Little Help needed climbing 3-5 steps with a railing? : A Little 6 Click Score: 20    End of Session Equipment Utilized During Treatment: Gait belt;Oxygen Activity Tolerance: Patient tolerated treatment well Patient left: in chair;with call bell/phone within reach Nurse Communication: Mobility status PT Visit Diagnosis: Difficulty in walking, not elsewhere classified (R26.2)    Time: 3007-6226 PT Time Calculation (min) (ACUTE ONLY): 18 min   Charges:   PT Evaluation $PT Eval Low Complexity: 1 Low          Lorrin Goodell, PT  Office # [phone] Pager [phone]   Lorriane Shire 05/06/2019, 11:35 AM

## 2019-05-06 NOTE — Progress Notes (Signed)
Chaplain responded to spiritual consult  Patient may be interested in completing an Advanced Directive. Chaplain saw that doctor had already left AD form with patient.  Chaplain explained [employer].  Patient said she wanted more time to think about it and would complete it probably after discharge. Rev. Tamsen Snider Pager [phone]

## 2019-05-06 NOTE — Progress Notes (Signed)
Initial Nutrition Assessment  DOCUMENTATION CODES:   Not applicable  INTERVENTION:  -Ensure Enlive po BID, each supplement provides 350 kcal and 20 grams of protein -Continue with MVI with minerals -Continue to monitor meal intake   NUTRITION DIAGNOSIS:   Increased nutrient needs related to acute illness(acute hypoxic respiratory failure second to COPD exacerbation) as evidenced by estimated needs.   GOAL:   Patient will meet greater than or equal to 90% of their needs   MONITOR:   PO intake, Supplement acceptance, Labs, Weight trends  REASON FOR ASSESSMENT:   Consult Assessment of nutrition requirement/status  ASSESSMENT:  RD working remotely.  73 year old female with medical history significant of COPD, GERD, HTN, who quit smoking in December of last year who presented to ED for progressive SOB, wheezing, and cough x 2 days; O2 stats were 86% on room air and 97% on 4L. Patient given steroids with breathing treatment in ED and admitted to hospital acute hypoxic respiratory failure secondary to COPD exacerbation.  RD consulted for assessment of nutritional status. Unable to reach patient after multiple attempts via phone.   Per chart review, pt seen by physician this morning during breakfast, pt reported feeling somewhat better, but not back to baseline. Patient without prior COPD complications, not on home O2; continues with 2L with SPO2 96-100%   Patient with good po - 100% of HH breakfast this morning. RD to order Ensure BID to assist with calorie and protein needs  Current weight - 61.6 kg with +1 mild pitting BLE Stable weight history  Medications reviewed and include: VitD3, Duoneb, methylprednisolone, MVI with minerals, protonix, vit C  Labs: Potassium 3.4  Glucose 200  NUTRITION - FOCUSED PHYSICAL EXAM: Unable to complete at this time   Diet Order:   Diet Order            Diet Heart Room service appropriate? Yes; Fluid consistency: Thin  Diet effective  now              EDUCATION NEEDS:   No education needs have been identified at this time  Skin:  Skin Assessment: Reviewed RN Assessment  Last BM:  7/26  Height:   Ht Readings from Last 1 Encounters:  12/16/18 5\' 1"  (1.549 m)    Weight:   Wt Readings from Last 1 Encounters:  05/05/19 61.6 kg    Ideal Body Weight:  47.7 kg  BMI:  Body mass index is 25.66 kg/m.  Estimated Nutritional Needs:   Kcal:  1650-1850  Protein:  83-93  Fluid:  >1.6L   Lajuan Lines, RD, LDN  After Hours/Weekend Pager: [phone]

## 2019-05-06 NOTE — Plan of Care (Signed)

## 2019-05-06 NOTE — Progress Notes (Signed)
PROGRESS NOTE  Morgan [name] YYT:[id number] DOB: [birth year] DOA: 05/05/2019 PCP: [name], NP  HPI/Recap of past 24 hours: HPI from Dr Wilhelmenia Blase is a 73 y.o. female with medical history significant of COPD, GERD, hypertension, seasonal allergies, who quit smoking in December of last year and has been vaping since then. 1st time admission for COPD exacerbation, started having progressive shortness of breath, wheezing and cough in the last 2 days.  Patient has inhalers at home that she try but no relief.  She came to the ER in acute respiratory failure with hypoxia.  Patient was struggling to breathe in obvious distress.  Patient denies any exposure to COVID-19.  She denies any heart disease.  She tried antibiotics at home including doxycycline and amoxicillin from her husband.  Symptoms continue to get worse.  She is being admitted with acute exacerbation of COPD. In the ED, RR of 32 oxygen, sat 86% room air and 97% on 4 L.  ABG showed a pH of 7.36 PCO2 of 40 and PO2 of 46. COVID-19 testing is negative.  Chest x-ray shows mild scarring at the right base noedemaorconsolidation.  Patient has been given steroids with breathing treatment in the ER and will be admitted for treatment.    Today, met patient sitting up in chair having her breakfast, reports she feels somewhat better, but not back to her baseline.  Denies any chest pain, abdominal pain, nausea/vomiting, fever/chills.  Still coughing.  Assessment/Plan: Active Problems:   Hyperlipidemia   Essential hypertension   Anxiety and depression   GERD (gastroesophageal reflux disease)   Tobacco abuse   COPD exacerbation (HCC)  Acute hypoxic respiratory failure 2/2 COPD exacerbation Currently on oxygen 2 L, saturating well Afebrile, no leukocytosis UA, urine strep pneumo/Legionella pending Chest x-ray showed mild scaring right base, no edema or consolidation Continue steroids Patient allergic to azithromycin, may consider  doxycycline if no significant improvement.  Will DC ceftriaxone as there is no evidence of pneumonia. Duo nebs, incentive spirometry Supplemental oxygen PRN PT/OT Monitor closely  Hypertension Continue home regimen  GERD Continue PPI  History of tobacco abuse Patient reports currently quitting in December 2019 (about 11/2 pack/day), but has been vaping since then Discouraged the use of vaping as well Advised patient to try nicotine patch, ordered Currently refusing to try the patch right now  Generalized anxiety disorder Continue home regimen       Malnutrition Type:      Malnutrition Characteristics:      Nutrition Interventions:       Estimated body mass index is 25.66 kg/m as calculated from the following:   Height as of 12/16/18: 5' 1"  (1.549 m).   Weight as of this encounter: 61.6 kg.     Code Status: Full  Family Communication: Discussed with patient  Disposition Plan: Likely home   Consultants:  None  Procedures:  None  Antimicrobials:  None  DVT prophylaxis: Lovenox   Objective: Vitals:   05/06/19 0147 05/06/19 0339 05/06/19 0732 05/06/19 0920  BP:  114/66 133/75   Pulse: 92 89  88  Resp: 18 (!) 21  17  Temp:  98.2 F (36.8 C) 98 F (36.7 C)   TempSrc:  Oral Oral   SpO2: 99% 96%  98%  Weight:        Intake/Output Summary (Last 24 hours) at 05/06/2019 1033 Last data filed at 05/06/2019 1019 Gross per 24 hour  Intake 1084.81 ml  Output -  Net 1084.[place]  ml   Filed Weights   05/05/19 2000  Weight: 61.6 kg    Exam:  General: NAD   Cardiovascular: S1, S2 present  Respiratory:  Diminished breath sounds bilaterally, mild wheezing noted bilaterally  Abdomen: Soft, nontender, nondistended, bowel sounds present  Musculoskeletal: No bilateral pedal edema noted  Skin: Normal  Psychiatry: Normal mood   Data Reviewed: CBC: Recent Labs  Lab 05/05/19 1238 05/05/19 1251  WBC 9.3  --   NEUTROABS 4.7  --   HGB 12.4  13.6  HCT 37.1 40.0  MCV 96.1  --   PLT 374  --    Basic Metabolic Panel: Recent Labs  Lab 05/05/19 1238 05/05/19 1251 05/06/19 0319  NA 134* 133* 133*  K 4.1 4.1 3.4*  CL 100  --  101  CO2 22  --  20*  GLUCOSE 125*  --  200*  BUN 11  --  11  CREATININE 0.72  --  0.71  CALCIUM 9.3  --  8.6*   GFR: Estimated Creatinine Clearance: 54.3 mL/min (by C-G formula based on SCr of 0.71 mg/dL). Liver Function Tests: Recent Labs  Lab 05/05/19 1238 05/06/19 0319  AST 27 26  ALT 16 15  ALKPHOS 61 56  BILITOT 0.8 0.3  PROT 7.2 6.1*  ALBUMIN 4.5 3.8   No results for input(s): LIPASE, AMYLASE in the last 168 hours. No results for input(s): AMMONIA in the last 168 hours. Coagulation Profile: No results for input(s): INR, PROTIME in the last 168 hours. Cardiac Enzymes: No results for input(s): CKTOTAL, CKMB, CKMBINDEX, TROPONINI in the last 168 hours. BNP (last 3 results) No results for input(s): PROBNP in the last 8760 hours. HbA1C: No results for input(s): HGBA1C in the last 72 hours. CBG: No results for input(s): GLUCAP in the last 168 hours. Lipid Profile: No results for input(s): CHOL, HDL, LDLCALC, TRIG, CHOLHDL, LDLDIRECT in the last 72 hours. Thyroid Function Tests: No results for input(s): TSH, T4TOTAL, FREET4, T3FREE, THYROIDAB in the last 72 hours. Anemia Panel: No results for input(s): VITAMINB12, FOLATE, FERRITIN, TIBC, IRON, RETICCTPCT in the last 72 hours. Urine analysis:    Component Value Date/Time   BILIRUBINUR negative 09/08/2015 1156   PROTEINUR +- 09/08/2015 1156   UROBILINOGEN negative 09/08/2015 1156   NITRITE negative 09/08/2015 1156   LEUKOCYTESUR Trace (A) 09/08/2015 1156   Sepsis Labs: '@LABRCNTIP'$ (procalcitonin:4,lacticidven:4)  ) Recent Results (from the past 240 hour(s))  Novel Coronavirus, NAA ([facility])     Status: None   Collection Time: 05/02/19 12:00 AM  Result Value Ref Range Status   SARS-CoV-2, NAA Not Detected Not Detected Final     Comment: Testing was performed using the cobas(R) SARS-CoV-2 test. This test was developed and its performance characteristics determined by [employer]. This test has not been FDA cleared or approved. This test has been authorized by FDA under an Emergency Use Authorization (EUA). This test is only authorized for the duration of time the declaration that circumstances exist justifying the authorization of the emergency use of in vitro diagnostic tests for detection of SARS-CoV-2 virus and/or diagnosis of COVID-19 infection under section 564(b)(1) of the Act, 21 U.S.C. 161WRU-0(A)(5), unless the authorization is terminated or revoked sooner. When diagnostic testing is negative, the possibility of a false negative result should be considered in the context of a patient's recent exposures and the presence of clinical signs and symptoms consistent with COVID-19. An individual without symptoms of COVID-19 and who is not shedding SARS-CoV-2 virus would expect to have  a negati ve (not detected) result in this assay.   SARS Coronavirus 2 (CEPHEID - Performed in [place] hospital lab), Hosp Order     Status: None   Collection Time: 05/05/19 12:38 PM   Specimen: Nasopharyngeal Swab  Result Value Ref Range Status   SARS Coronavirus 2 NEGATIVE NEGATIVE Final    Comment: (NOTE) If result is NEGATIVE SARS-CoV-2 target nucleic acids are NOT DETECTED. The SARS-CoV-2 RNA is generally detectable in upper and lower  respiratory specimens during the acute phase of infection. The lowest  concentration of SARS-CoV-2 viral copies this assay can detect is 250  copies / mL. A negative result does not preclude SARS-CoV-2 infection  and should not be used as the sole basis for treatment or other  patient management decisions.  A negative result may occur with  improper specimen collection / handling, submission of specimen other  than nasopharyngeal swab, presence of viral mutation(s) within  the  areas targeted by this assay, and inadequate number of viral copies  (<250 copies / mL). A negative result must be combined with clinical  observations, patient history, and epidemiological information. If result is POSITIVE SARS-CoV-2 target nucleic acids are DETECTED. The SARS-CoV-2 RNA is generally detectable in upper and lower  respiratory specimens dur ing the acute phase of infection.  Positive  results are indicative of active infection with SARS-CoV-2.  Clinical  correlation with patient history and other diagnostic information is  necessary to determine patient infection status.  Positive results do  not rule out bacterial infection or co-infection with other viruses. If result is PRESUMPTIVE POSTIVE SARS-CoV-2 nucleic acids MAY BE PRESENT.   A presumptive positive result was obtained on the submitted specimen  and confirmed on repeat testing.  While 2019 novel coronavirus  (SARS-CoV-2) nucleic acids may be present in the submitted sample  additional confirmatory testing may be necessary for epidemiological  and / or clinical management purposes  to differentiate between  SARS-CoV-2 and other Sarbecovirus currently known to infect humans.  If clinically indicated additional testing with an alternate test  methodology [phone]) is advised. The SARS-CoV-2 RNA is generally  detectable in upper and lower respiratory sp ecimens during the acute  phase of infection. The expected result is Negative. Fact Sheet for Patients:  StrictlyIdeas.no Fact Sheet for Healthcare Providers: BankingDealers.co.za This test is not yet approved or cleared by the [place] FDA and has been authorized for detection and/or diagnosis of SARS-CoV-2 by FDA under an Emergency Use Authorization (EUA).  This EUA will remain in effect (meaning this test can be used) for the duration of the COVID-19 declaration under Section 564(b)(1) of the Act, 21  U.S.C. section 360bbb-3(b)(1), unless the authorization is terminated or revoked sooner. Performed at [facility], [place] [address], [place], [place] [address]   MRSA PCR Screening     Status: None   Collection Time: 05/05/19  8:04 PM   Specimen: Nasopharyngeal  Result Value Ref Range Status   MRSA by PCR NEGATIVE NEGATIVE Final    Comment:        The GeneXpert MRSA Assay (FDA approved for NASAL specimens only), is one component of a comprehensive MRSA colonization surveillance program. It is not intended to diagnose MRSA infection nor to guide or monitor treatment for MRSA infections. Performed at [facility], [place] [address] [place],  [address]       Studies: Dg Chest Port 1 View  Result Date: 05/05/2019 CLINICAL DATA:  Shortness of  breath and cough EXAM: PORTABLE CHEST 1 VIEW COMPARISON:  October 30, 2016 FINDINGS: There is mild scarring in the right base. There is no edema or consolidation. Heart size and pulmonary vascularity are normal. No adenopathy. There is aortic atherosclerosis. There is thoracic levoscoliosis as well as thoracolumbar dextroscoliosis. There is postoperative change in the lower cervical region. IMPRESSION: Mild scarring right base. No edema or consolidation. Stable cardiac silhouette. Aortic Atherosclerosis (ICD10-I70.0). Electronically Signed   By: Lowella Grip III M.D.   On: 05/05/2019 13:01    Scheduled Meds: . cholecalciferol  5,000 Units Oral Daily  . enoxaparin (LOVENOX) injection  40 mg Subcutaneous Q24H  . hydrALAZINE  25 mg Oral q morning - 10a   And  . hydrALAZINE  50 mg Oral QHS  . ipratropium-albuterol  3 mL Nebulization Q6H  . irbesartan  300 mg Oral Daily  . mouth rinse  15 mL Mouth Rinse BID  . methylPREDNISolone (SOLU-MEDROL) injection  80 mg Intravenous Q6H   Followed by  . [START ON 05/07/2019] predniSONE  40 mg Oral Q breakfast  . metoprolol succinate  25 mg Oral Daily  . multivitamin with minerals  1  tablet Oral Daily  . pantoprazole  40 mg Oral Daily  . pneumococcal 23 valent vaccine  0.5 mL Intramuscular Tomorrow-1000  . simvastatin  40 mg Oral QPM  . venlafaxine XR  75 mg Oral Q breakfast  . vitamin C  500 mg Oral Daily    Continuous Infusions: . cefTRIAXone (ROCEPHIN)  IV Stopped (05/05/19 2143)     LOS: 1 day     Alma Friendly, MD [facility]  If 7PM-7AM, please contact night-coverage [email or web address] 05/06/2019, 10:33 AM

## 2019-05-06 NOTE — Evaluation (Addendum)
Occupational Therapy Evaluation Patient Details Name: Morgan [name] MRN: [id number] DOB: 1946-04-14 Today's Date: 05/06/2019    History of Present Illness Pt is a 73 y/o female presenting with progressive SOB, wheezing and cough for 2 days. Found in acute repiratory failure with hypoxia, admitted with acute exacerbation of COPD. PMH: COPD, HTN.    Clinical Impression   PTA patient independent and driving. Admitted for above and limited by problem list below, including impaired balance and decreased activity tolerance.  She requires min guard for transfers, LB ADLs, and in room mobility.  Mild instability, reaching out for UE support during tasks/mobility, and fatigues easily.  Initiated education on energy conservation and home safety.  VSS on 2L via [place], SpO2 98-100%. Patient will benefit from continued OT services while admitted in order to maximize activity tolerance, independence and safety with ADLs.  Will follow while admitted but anticipate no further needs after dc.     Follow Up Recommendations  No OT follow up;Supervision - Intermittent    Equipment Recommendations  Tub/shower seat    Recommendations for Other Services PT consult     Precautions / Restrictions Restrictions Weight Bearing Restrictions: No      Mobility Bed Mobility Overal bed mobility: Modified Independent             General bed mobility comments: no assist required, HOB elevated  Transfers Overall transfer level: Needs assistance   Transfers: Sit to/from Stand Sit to Stand: Min guard         General transfer comment: min guard for safety and balance, mild unsteadiness    Balance Overall balance assessment: Needs assistance Sitting-balance support: No upper extremity supported;Feet supported Sitting balance-Leahy Scale: Good     Standing balance support: No upper extremity supported;During functional activity Standing balance-Leahy Scale: Fair Standing balance comment: patient  unsteady, reaching out for UE support dyanimcally                            ADL either performed or assessed with clinical judgement   ADL Overall ADL's : Needs assistance/impaired     Grooming: Min guard;Standing   Upper Body Bathing: Set up;Sitting   Lower Body Bathing: Min guard;Sit to/from stand   Upper Body Dressing : Supervision/safety;Sitting   Lower Body Dressing: Min guard;Sit to/from stand   Toilet Transfer: Min guard;Ambulation Toilet Transfer Details (indicate cue type and reason): simulated to recliner          Functional mobility during ADLs: Min guard General ADL Comments: pt limited by decreased activity tolerance, impaired balance     Vision         Perception     Praxis      Pertinent Vitals/Pain Pain Assessment: No/denies pain     Hand Dominance     Extremity/Trunk Assessment Upper Extremity Assessment Upper Extremity Assessment: Overall WFL for tasks assessed   Lower Extremity Assessment Lower Extremity Assessment: Defer to PT evaluation   Cervical / Trunk Assessment Cervical / Trunk Assessment: Normal   Communication Communication Communication: HOH   Cognition Arousal/Alertness: Awake/alert Behavior During Therapy: WFL for tasks assessed/performed Overall Cognitive Status: Within Functional Limits for tasks assessed                                     General Comments  VSS, SPO2 98-100% on 2L [place]    Exercises  Shoulder Instructions      Home Living Family/patient expects to be discharged to:: Private residence Living Arrangements: Spouse/significant other Available Help at Discharge: Family Type of Home: House Home Access: Level entry     Home Layout: Two level;Able to live on main level with bedroom/bathroom(does not have to access 2nd floor)     Bathroom Shower/Tub: Tub/shower unit   Bathroom Toilet: Standard     Home Equipment: Environmental consultant - 2 wheels;Cane - single point;Toilet  riser;Grab bars - tub/shower;Wheelchair - manual          Prior Functioning/Environment Level of Independence: Independent        Comments: driving         OT Problem List: Decreased strength;Decreased activity tolerance;Impaired balance (sitting and/or standing);Decreased knowledge of use of DME or AE;Cardiopulmonary status limiting activity      OT Treatment/Interventions: Self-care/ADL training;Energy conservation;DME and/or AE instruction;Therapeutic activities;Balance training;Patient/family education    OT Goals(Current goals can be found in the care plan section) Acute Rehab OT Goals Patient Stated Goal: to get stronger, get home  OT Goal Formulation: With patient Time For Goal Achievement: 05/20/19 Potential to Achieve Goals: Good  OT Frequency: Min 2X/week   Barriers to D/C:            Co-evaluation              AM-PAC OT "6 Clicks" Daily Activity     Outcome Measure Help from another person eating meals?: None Help from another person taking care of personal grooming?: A Little Help from another person toileting, which includes using toliet, bedpan, or urinal?: A Little Help from another person bathing (including washing, rinsing, drying)?: A Little Help from another person to put on and taking off regular upper body clothing?: None Help from another person to put on and taking off regular lower body clothing?: A Little 6 Click Score: 20   End of Session Equipment Utilized During Treatment: Gait belt Nurse Communication: Mobility status  Activity Tolerance: Patient tolerated treatment well Patient left: in chair;with call bell/phone within reach  OT Visit Diagnosis: Unsteadiness on feet (R26.81)                Time: 9622-2979 OT Time Calculation (min): 10 min Charges:  OT General Charges $OT Visit: 1 Visit OT Evaluation $OT Eval Low Complexity: 1 Low  Delight Stare, OT Acute Rehabilitation Services Pager [phone] Office [phone]     Delight Stare 05/06/2019, 9:47 AM

## 2019-05-07 ENCOUNTER — Telehealth: Payer: Self-pay

## 2019-05-07 LAB — CBC WITH DIFFERENTIAL/PLATELET
Abs Immature Granulocytes: 0.22 10*3/uL — ABNORMAL HIGH (ref 0.00–0.07)
Basophils Absolute: 0 10*3/uL (ref 0.0–0.1)
Basophils Relative: 0 %
Eosinophils Absolute: 0 10*3/uL (ref 0.0–0.5)
Eosinophils Relative: 0 %
HCT: 31.6 % — ABNORMAL LOW (ref 36.0–46.0)
Hemoglobin: 10.7 g/dL — ABNORMAL LOW (ref 12.0–15.0)
Immature Granulocytes: 1 %
Lymphocytes Relative: 5 %
Lymphs Abs: 1 10*3/uL (ref 0.7–4.0)
MCH: 32.3 pg (ref 26.0–34.0)
MCHC: 33.9 g/dL (ref 30.0–36.0)
MCV: 95.5 fL (ref 80.0–100.0)
Monocytes Absolute: 2 10*3/uL — ABNORMAL HIGH (ref 0.1–1.0)
Monocytes Relative: 10 %
Neutro Abs: 16.7 10*3/uL — ABNORMAL HIGH (ref 1.7–7.7)
Neutrophils Relative %: 84 %
Platelets: 357 10*3/uL (ref 150–400)
RBC: 3.31 MIL/uL — ABNORMAL LOW (ref 3.87–5.11)
RDW: 13.2 % (ref 11.5–15.5)
WBC: 19.9 10*3/uL — ABNORMAL HIGH (ref 4.0–10.5)
nRBC: 0 % (ref 0.0–0.2)

## 2019-05-07 LAB — GLUCOSE, CAPILLARY: Glucose-Capillary: 158 mg/dL — ABNORMAL HIGH (ref 70–99)

## 2019-05-07 LAB — LEGIONELLA PNEUMOPHILA SEROGP 1 UR AG: L. pneumophila Serogp 1 Ur Ag: NEGATIVE

## 2019-05-07 LAB — BASIC METABOLIC PANEL
Anion gap: 11 (ref 5–15)
BUN: 14 mg/dL (ref 8–23)
CO2: 22 mmol/L (ref 22–32)
Calcium: 9.1 mg/dL (ref 8.9–10.3)
Chloride: 101 mmol/L (ref 98–111)
Creatinine, Ser: 0.63 mg/dL (ref 0.44–1.00)
GFR calc Af Amer: >60 mL/min (ref >60)
GFR calc non Af Amer: >60 mL/min (ref >60)
Glucose, Bld: 119 mg/dL — ABNORMAL HIGH (ref 70–99)
Potassium: 4.2 mmol/L (ref 3.5–5.1)
Sodium: 134 mmol/L — ABNORMAL LOW (ref 135–145)

## 2019-05-07 MED ORDER — PREDNISONE 20 MG PO TABS: 40.0000 mg | ORAL_TABLET | Freq: Every day | ORAL | 0 refills | Status: AC

## 2019-05-07 MED ORDER — IPRATROPIUM-ALBUTEROL 0.5-2.5 (3) MG/3ML IN SOLN
3.0000 mL | Freq: Two times a day (BID) | RESPIRATORY_TRACT | Status: DC
  Administered 2019-05-07: 07:00:00 3 mL via RESPIRATORY_TRACT
  Filled 2019-05-07: qty 3

## 2019-05-07 NOTE — Plan of Care (Signed)
  Problem: Education: Goal: Knowledge of General Education information will improve Description: Including pain rating scale, medication(s)/side effects and non-pharmacologic comfort measures Outcome: Adequate for Discharge   Problem: Health Behavior/Discharge Planning: Goal: Ability to manage health-related needs will improve Outcome: Adequate for Discharge   Problem: Clinical Measurements: Goal: Ability to maintain clinical measurements within normal limits will improve Outcome: Adequate for Discharge Goal: Will remain free from infection Outcome: Adequate for Discharge Goal: Diagnostic test results will improve Outcome: Adequate for Discharge Goal: Respiratory complications will improve Outcome: Adequate for Discharge Goal: Cardiovascular complication will be avoided Outcome: Adequate for Discharge   Problem: Activity: Goal: Risk for activity intolerance will decrease Outcome: Adequate for Discharge   Problem: Nutrition: Goal: Adequate nutrition will be maintained Outcome: Adequate for Discharge   Problem: Coping: Goal: Level of anxiety will decrease Outcome: Adequate for Discharge   Problem: Elimination: Goal: Will not experience complications related to bowel motility Outcome: Adequate for Discharge Goal: Will not experience complications related to urinary retention Outcome: Adequate for Discharge   Problem: Pain Managment: Goal: General experience of comfort will improve Outcome: Adequate for Discharge   Problem: Safety: Goal: Ability to remain free from injury will improve Outcome: Adequate for Discharge   Problem: Skin Integrity: Goal: Risk for impaired skin integrity will decrease Outcome: Adequate for Discharge   Problem: Acute Rehab OT Goals (only OT should resolve) Goal: Pt. Will Transfer To Toilet Outcome: Adequate for Discharge Goal: Pt. Will Perform Toileting-Clothing Manipulation Outcome: Adequate for Discharge Goal: Pt. Will Perform Tub/Shower  Transfer Outcome: Adequate for Discharge Goal: OT Additional ADL Goal #1 Outcome: Adequate for Discharge   Problem: Acute Rehab PT Goals(only PT should resolve) Goal: Patient Will Transfer Sit To/From Stand Outcome: Adequate for Discharge Goal: Pt Will Transfer Bed To Chair/Chair To Bed Outcome: Adequate for Discharge Goal: Pt Will Ambulate Outcome: Adequate for Discharge   Problem: Increased Nutrient Needs (NI-5.1) Goal: Food and/or nutrient delivery Description: Individualized approach for food/nutrient provision. Outcome: Adequate for Discharge

## 2019-05-07 NOTE — Telephone Encounter (Signed)
Noted  

## 2019-05-07 NOTE — Discharge Summary (Signed)
Physician Discharge Summary  Morgan [name] [facility] WIO:[id number] DOB: 1945/12/07 DOA: 05/05/2019  PCP: [name], NP  Admit date: 05/05/2019 Discharge date: 05/07/2019  Admitted From: Home Disposition:  Home  Discharge Condition:Stable CODE STATUS:FULL Diet recommendation: Heart Healthy   Brief/Interim Summary:  HPI from Dr Fulton Reek [facility] a 73 y.o.femalewith medical history significant ofCOPD, GERD, hypertension, seasonal allergies, who quit smoking in December of last year and has been vaping since then. 1st time admission for COPD exacerbation, started having progressive shortness of breath, wheezing and cough in the last 2 days. Patient has inhalers at home that she try but no relief. She came to the ER in acute respiratory failure with hypoxia. Patient was struggling to breathe in obvious distress. Patient denies any exposure to COVID-19. She denies any heart disease. She tried antibiotics at home including doxycycline and amoxicillin from her husband. Symptoms continue to get worse. She is being admitted with acute exacerbation of COPD. In the ED, RR of 32 oxygen, sat 86% room air and 97% on 4 L. ABG showed a pH of 7.36 PCO2 of 40 and PO2 of 46. COVID-19 testing is negative. Chest x-ray shows mild scarring at the right base noedemaorconsolidation. Patient has been given steroids with breathing treatment in the ER and will be admitted for treatment.   Hospital Course:  Patient's hospital course remained stable.  She was started on IV steroids, bronchodilators.  Initially she needed oxygen supplementation for the maintenance of saturation but currently she is on room air.  She needs to follow-up with pulmonology as an outpatient for pulmonary function test.  She is hemodynamically stable for discharge to home today.  Following problems were addressed during her hospitalization:  Acute hypoxic respiratory failure 2/2 COPD exacerbation Chest x-ray showed mild scaring  right base, no edema or consolidation Currently on oral steroids. Follow-up with pulmonology as an outpatient.  She is on albuterol inhaler at home.  Hypertension Continue home regimen  GERD Continue PPI  History of tobacco abuse Patient reports currently quitting in December 2019 (about 11/2 pack/day), but has been vaping since then Discouraged the use of vaping as well Advised patient to try nicotine patch.  Generalized anxiety disorder Continue home regimen        Discharge Diagnoses:  Active Problems:   Hyperlipidemia   Essential hypertension   Anxiety and depression   GERD (gastroesophageal reflux disease)   Tobacco abuse   COPD exacerbation [facility])    Discharge Instructions  Discharge Instructions    Ambulatory referral to Pulmonology   Complete by: As directed    Diet - low sodium heart healthy   Complete by: As directed    Discharge instructions   Complete by: As directed    1) Please follow up with your PCP in a week.Do a CBC test during the follow up. 2) Please follow up with pulmonology in 2 weeks. Name and number of the provider has been attached. 3) Please stop smoking. 4)Take prescribed medications as instructed.   Increase activity slowly   Complete by: As directed      Allergies as of 05/07/2019      Reactions   Hctz [hydrochlorothiazide] Other (See Comments)   Hyponatremia    Amlodipine Swelling   Ankle/leg swelling   Azithromycin Nausea And Vomiting   Codeine Itching   Makes me crazy      Medication List    STOP taking these medications   amoxicillin-clavulanate 875-125 MG tablet Commonly known as: AUGMENTIN  doxycycline 100 MG tablet Commonly known as: VIBRA-TABS     TAKE these medications   albuterol 108 (90 Base) MCG/ACT inhaler Commonly known as: ProAir HFA Inhale 2 puffs into the lungs every 4 (four) hours as needed for wheezing or shortness of breath.   D-3-5 125 MCG (5000 UT) capsule Generic drug:  Cholecalciferol Take 5,000 Units by mouth daily.   denosumab 60 MG/ML Sosy injection Commonly known as: PROLIA Inject 60 mg into the skin every 6 (six) months. Last administration June 2020   hydrALAZINE 50 MG tablet Commonly known as: APRESOLINE TAKE 1/2 TABLET EVERY MORNING AND 1 FULL TABLET IN THE EVENING FOR BLOOD PRESSURE. What changed: See the new instructions.   ibuprofen 200 MG tablet Commonly known as: ADVIL Take 400-600 mg by mouth every 8 (eight) hours as needed for mild pain or moderate pain.   irbesartan 300 MG tablet Commonly known as: AVAPRO TAKE 1 TABLET (300 MG TOTAL) BY MOUTH DAILY. FOR BLOOD PRESSURE.   metoprolol succinate 25 MG 24 hr tablet Commonly known as: TOPROL-XL Take 1 tablet (25 mg total) by mouth daily. For blood pressure.   Multivitamin Gummies Adults Chew Chew 2 tablets by mouth daily.   omeprazole 20 MG capsule Commonly known as: PRILOSEC Take 1 capsule (20 mg total) by mouth daily. For heartburn.   polyethylene glycol powder 17 GM/SCOOP powder Commonly known as: GLYCOLAX/MIRALAX Mix 17 g into water once to twice daily as needed for constipation.   predniSONE 20 MG tablet Commonly known as: DELTASONE Take 2 tablets (40 mg total) by mouth daily with breakfast for 3 days. Start taking on: May 08, 2019   simvastatin 40 MG tablet Commonly known as: ZOCOR Take 1 tablet (40 mg total) by mouth every evening. For cholesterol.   venlafaxine XR 75 MG 24 hr capsule Commonly known as: EFFEXOR-XR Take 1 capsule (75 mg total) by mouth daily with breakfast. For anxiety/depression.   vitamin C 500 MG tablet Commonly known as: ASCORBIC ACID Take 500 mg by mouth daily.      Follow-up Information    [name], NP. Schedule an appointment as soon as possible for a visit in 1 week(s).   Specialty: Internal Medicine Contact information: [place] [address] [phone]        Marshell Garfinkel, MD. Schedule an  appointment as soon as possible for a visit in 2 week(s).   Specialty: Pulmonary Disease Contact information: [place] [address] [phone]          Allergies  Allergen Reactions  . Hctz [Hydrochlorothiazide] Other (See Comments)    Hyponatremia   . Amlodipine Swelling    Ankle/leg swelling  . Azithromycin Nausea And Vomiting  . Codeine Itching    Makes me crazy    Consultations:  None   Procedures/Studies: Dg Chest Port 1 View  Result Date: 05/05/2019 CLINICAL DATA:  Shortness of breath and cough EXAM: PORTABLE CHEST 1 VIEW COMPARISON:  October 30, 2016 FINDINGS: There is mild scarring in the right base. There is no edema or consolidation. Heart size and pulmonary vascularity are normal. No adenopathy. There is aortic atherosclerosis. There is thoracic levoscoliosis as well as thoracolumbar dextroscoliosis. There is postoperative change in the lower cervical region. IMPRESSION: Mild scarring right base. No edema or consolidation. Stable cardiac silhouette. Aortic Atherosclerosis (ICD10-I70.0). Electronically Signed   By: Lowella Grip III M.D.   On: 05/05/2019 13:01  Subjective:  Patient seen and examined at the bedside this morning.  Comfortable.  Hemodynamically stable.  Currently on room air.  Denies any shortness of breath.  Stable for discharge Discharge Exam: Vitals:   05/07/19 0718 05/07/19 0727  BP:    Pulse:    Resp:    Temp: 98.1 F (36.7 C)   SpO2:  97%   Vitals:   05/06/19 2325 05/07/19 0342 05/07/19 0718 05/07/19 0727  BP: (!) 144/79 (!) 144/84    Pulse: 82 75    Resp: (!) 23 17    Temp: 98.2 F (36.8 C) 98.3 F (36.8 C) 98.1 F (36.7 C)   TempSrc: Oral Oral Oral   SpO2: 96% 94%  97%  Weight:        General: Pt is alert, awake, not in acute distress Cardiovascular: RRR, S1/S2 +, no rubs, no gallops Respiratory: CTA bilaterally, no wheezing, no rhonchi Abdominal: Soft, NT, ND, bowel sounds  + Extremities: no edema, no cyanosis    The results of significant diagnostics from this hospitalization (including imaging, microbiology, ancillary and laboratory) are listed below for reference.     Microbiology: Recent Results (from the past 240 hour(s))  Novel Coronavirus, NAA ([facility])     Status: None   Collection Time: 05/02/19 12:00 AM  Result Value Ref Range Status   SARS-CoV-2, NAA Not Detected Not Detected Final    Comment: Testing was performed using the cobas(R) SARS-CoV-2 test. This test was developed and its performance characteristics determined by [employer]. This test has not been FDA cleared or approved. This test has been authorized by FDA under an Emergency Use Authorization (EUA). This test is only authorized for the duration of time the declaration that circumstances exist justifying the authorization of the emergency use of in vitro diagnostic tests for detection of SARS-CoV-2 virus and/or diagnosis of COVID-19 infection under section 564(b)(1) of the Act, 21 U.S.C. 974BUL-8(G)(5), unless the authorization is terminated or revoked sooner. When diagnostic testing is negative, the possibility of a false negative result should be considered in the context of a patient's recent exposures and the presence of clinical signs and symptoms consistent with COVID-19. An individual without symptoms of COVID-19 and who is not shedding SARS-CoV-2 virus would expect to have a negati ve (not detected) result in this assay.   SARS Coronavirus 2 (CEPHEID - Performed in [place] hospital lab), Hosp Order     Status: None   Collection Time: 05/05/19 12:38 PM   Specimen: Nasopharyngeal Swab  Result Value Ref Range Status   SARS Coronavirus 2 NEGATIVE NEGATIVE Final    Comment: (NOTE) If result is NEGATIVE SARS-CoV-2 target nucleic acids are NOT DETECTED. The SARS-CoV-2 RNA is generally detectable in upper and lower  respiratory specimens during the acute phase  of infection. The lowest  concentration of SARS-CoV-2 viral copies this assay can detect is 250  copies / mL. A negative result does not preclude SARS-CoV-2 infection  and should not be used as the sole basis for treatment or other  patient management decisions.  A negative result may occur with  improper specimen collection / handling, submission of specimen other  than nasopharyngeal swab, presence of viral mutation(s) within the  areas targeted by this assay, and inadequate number of viral copies  (<250 copies / mL). A negative result must be combined with clinical  observations, patient history, and epidemiological information. If result is POSITIVE SARS-CoV-2 target nucleic acids are DETECTED. The SARS-CoV-2 RNA is generally detectable in  upper and lower  respiratory specimens dur ing the acute phase of infection.  Positive  results are indicative of active infection with SARS-CoV-2.  Clinical  correlation with patient history and other diagnostic information is  necessary to determine patient infection status.  Positive results do  not rule out bacterial infection or co-infection with other viruses. If result is PRESUMPTIVE POSTIVE SARS-CoV-2 nucleic acids MAY BE PRESENT.   A presumptive positive result was obtained on the submitted specimen  and confirmed on repeat testing.  While 2019 novel coronavirus  (SARS-CoV-2) nucleic acids may be present in the submitted sample  additional confirmatory testing may be necessary for epidemiological  and / or clinical management purposes  to differentiate between  SARS-CoV-2 and other Sarbecovirus currently known to infect humans.  If clinically indicated additional testing with an alternate test  methodology [phone]) is advised. The SARS-CoV-2 RNA is generally  detectable in upper and lower respiratory sp ecimens during the acute  phase of infection. The expected result is Negative. Fact Sheet for Patients:   StrictlyIdeas.no Fact Sheet for Healthcare Providers: BankingDealers.co.za This test is not yet approved or cleared by the [place] FDA and has been authorized for detection and/or diagnosis of SARS-CoV-2 by FDA under an Emergency Use Authorization (EUA).  This EUA will remain in effect (meaning this test can be used) for the duration of the COVID-19 declaration under Section 564(b)(1) of the Act, 21 U.S.C. section 360bbb-3(b)(1), unless the authorization is terminated or revoked sooner. Performed at [facility], [place] [address] [place], [place] [address]   MRSA PCR Screening     Status: None   Collection Time: 05/05/19  8:04 PM   Specimen: Nasopharyngeal  Result Value Ref Range Status   MRSA by PCR NEGATIVE NEGATIVE Final    Comment:        The GeneXpert MRSA Assay (FDA approved for NASAL specimens only), is one component of a comprehensive MRSA colonization surveillance program. It is not intended to diagnose MRSA infection nor to guide or monitor treatment for MRSA infections. Performed at [facility], [place] [address] [place],  [address]      Labs: BNP (last 3 results) No results for input(s): BNP in the last 8760 hours. Basic Metabolic Panel: Recent Labs  Lab 05/05/19 1238 05/05/19 1251 05/06/19 0319 05/07/19 0327  NA 134* 133* 133* 134*  K 4.1 4.1 3.4* 4.2  CL 100  --  101 101  CO2 22  --  20* 22  GLUCOSE 125*  --  200* 119*  BUN 11  --  11 14  CREATININE 0.72  --  0.71 0.63  CALCIUM 9.3  --  8.6* 9.1   Liver Function Tests: Recent Labs  Lab 05/05/19 1238 05/06/19 0319  AST 27 26  ALT 16 15  ALKPHOS 61 56  BILITOT 0.8 0.3  PROT 7.2 6.1*  ALBUMIN 4.5 3.8   No results for input(s): LIPASE, AMYLASE in the last 168 hours. No results for input(s): AMMONIA in the last 168 hours. CBC: Recent Labs  Lab 05/05/19 1238 05/05/19 1251 05/07/19 0327  WBC 9.3  --  19.9*  NEUTROABS 4.7   --  16.7*  HGB 12.4 13.6 10.7*  HCT 37.1 40.0 31.6*  MCV 96.1  --  95.5  PLT 374  --  357   Cardiac Enzymes: No results for input(s): CKTOTAL, CKMB, CKMBINDEX, TROPONINI in the last 168 hours. BNP: Invalid input(s): POCBNP CBG: Recent Labs  Lab 05/06/19 1633  05/06/19 2115 05/07/19 0609  GLUCAP 215* 162* 158*   D-Dimer No results for input(s): DDIMER in the last 72 hours. Hgb A1c No results for input(s): HGBA1C in the last 72 hours. Lipid Profile No results for input(s): CHOL, HDL, LDLCALC, TRIG, CHOLHDL, LDLDIRECT in the last 72 hours. Thyroid function studies No results for input(s): TSH, T4TOTAL, T3FREE, THYROIDAB in the last 72 hours.  Invalid input(s): FREET3 Anemia work up No results for input(s): VITAMINB12, FOLATE, FERRITIN, TIBC, IRON, RETICCTPCT in the last 72 hours. Urinalysis    Component Value Date/Time   COLORURINE YELLOW 05/06/2019 1355   APPEARANCEUR CLEAR 05/06/2019 1355   LABSPEC 1.013 05/06/2019 1355   PHURINE 6.0 05/06/2019 1355   GLUCOSEU 50 (A) 05/06/2019 1355   HGBUR NEGATIVE 05/06/2019 1355   BILIRUBINUR NEGATIVE 05/06/2019 1355   BILIRUBINUR negative 09/08/2015 [place] 05/06/2019 1355   PROTEINUR NEGATIVE 05/06/2019 1355   UROBILINOGEN negative 09/08/2015 1156   NITRITE NEGATIVE 05/06/2019 1355   LEUKOCYTESUR SMALL (A) 05/06/2019 1355   Sepsis Labs Invalid input(s): PROCALCITONIN,  WBC,  LACTICIDVEN Microbiology Recent Results (from the past 240 hour(s))  Novel Coronavirus, NAA ([facility])     Status: None   Collection Time: 05/02/19 12:00 AM  Result Value Ref Range Status   SARS-CoV-2, NAA Not Detected Not Detected Final    Comment: Testing was performed using the cobas(R) SARS-CoV-2 test. This test was developed and its performance characteristics determined by [employer]. This test has not been FDA cleared or approved. This test has been authorized by FDA under an Emergency Use Authorization (EUA). This  test is only authorized for the duration of time the declaration that circumstances exist justifying the authorization of the emergency use of in vitro diagnostic tests for detection of SARS-CoV-2 virus and/or diagnosis of COVID-19 infection under section 564(b)(1) of the Act, 21 U.S.C. 828MKL-4(J)(1), unless the authorization is terminated or revoked sooner. When diagnostic testing is negative, the possibility of a false negative result should be considered in the context of a patient's recent exposures and the presence of clinical signs and symptoms consistent with COVID-19. An individual without symptoms of COVID-19 and who is not shedding SARS-CoV-2 virus would expect to have a negati ve (not detected) result in this assay.   SARS Coronavirus 2 (CEPHEID - Performed in [place] hospital lab), Hosp Order     Status: None   Collection Time: 05/05/19 12:38 PM   Specimen: Nasopharyngeal Swab  Result Value Ref Range Status   SARS Coronavirus 2 NEGATIVE NEGATIVE Final    Comment: (NOTE) If result is NEGATIVE SARS-CoV-2 target nucleic acids are NOT DETECTED. The SARS-CoV-2 RNA is generally detectable in upper and lower  respiratory specimens during the acute phase of infection. The lowest  concentration of SARS-CoV-2 viral copies this assay can detect is 250  copies / mL. A negative result does not preclude SARS-CoV-2 infection  and should not be used as the sole basis for treatment or other  patient management decisions.  A negative result may occur with  improper specimen collection / handling, submission of specimen other  than nasopharyngeal swab, presence of viral mutation(s) within the  areas targeted by this assay, and inadequate number of viral copies  (<250 copies / mL). A negative result must be combined with clinical  observations, patient history, and epidemiological information. If result is POSITIVE SARS-CoV-2 target nucleic acids are DETECTED. The SARS-CoV-2 RNA is  generally detectable in upper and lower  respiratory specimens dur ing  the acute phase of infection.  Positive  results are indicative of active infection with SARS-CoV-2.  Clinical  correlation with patient history and other diagnostic information is  necessary to determine patient infection status.  Positive results do  not rule out bacterial infection or co-infection with other viruses. If result is PRESUMPTIVE POSTIVE SARS-CoV-2 nucleic acids MAY BE PRESENT.   A presumptive positive result was obtained on the submitted specimen  and confirmed on repeat testing.  While 2019 novel coronavirus  (SARS-CoV-2) nucleic acids may be present in the submitted sample  additional confirmatory testing may be necessary for epidemiological  and / or clinical management purposes  to differentiate between  SARS-CoV-2 and other Sarbecovirus currently known to infect humans.  If clinically indicated additional testing with an alternate test  methodology [phone]) is advised. The SARS-CoV-2 RNA is generally  detectable in upper and lower respiratory sp ecimens during the acute  phase of infection. The expected result is Negative. Fact Sheet for Patients:  StrictlyIdeas.no Fact Sheet for Healthcare Providers: BankingDealers.co.za This test is not yet approved or cleared by the [place] FDA and has been authorized for detection and/or diagnosis of SARS-CoV-2 by FDA under an Emergency Use Authorization (EUA).  This EUA will remain in effect (meaning this test can be used) for the duration of the COVID-19 declaration under Section 564(b)(1) of the Act, 21 U.S.C. section 360bbb-3(b)(1), unless the authorization is terminated or revoked sooner. Performed at [facility], [place] [address] [place], [place] [address]   MRSA PCR Screening     Status: None   Collection Time: 05/05/19  8:04 PM   Specimen: Nasopharyngeal  Result Value Ref Range Status    MRSA by PCR NEGATIVE NEGATIVE Final    Comment:        The GeneXpert MRSA Assay (FDA approved for NASAL specimens only), is one component of a comprehensive MRSA colonization surveillance program. It is not intended to diagnose MRSA infection nor to guide or monitor treatment for MRSA infections. Performed at [facility], [place] [address] [place], [place] [address]     Please note: You were cared for by a hospitalist during your hospital stay. Once you are discharged, your primary care physician will handle any further medical issues. Please note that NO REFILLS for any discharge medications will be authorized once you are discharged, as it is imperative that you return to your primary care physician (or establish a relationship with a primary care physician if you do not have one) for your post hospital discharge needs so that they can reassess your need for medications and monitor your lab values.    Time coordinating discharge: 40 minutes  SIGNED:   Shelly Coss, MD  [facility] 05/07/2019, 8:08 AM Pager [phone]  If 7PM-7AM, please contact night-coverage [email or web address] Password [email or web address]

## 2019-05-07 NOTE — Telephone Encounter (Signed)
Transition Care Management Follow-up Telephone Call   Date discharged? 05/07/2019   How have you been since you were released from the hospital? Not sleeping well, Prednisone wears her up, weak sensation is present but she is better with cough and breathing.   Do you understand why you were in the hospital? yes   Do you understand the discharge instructions? yes   Where were you discharged to? Home   Items Reviewed:  Medications reviewed: yes  Allergies reviewed: yes  Dietary changes reviewed: yes  Referrals reviewed: yes   Functional Questionnaire:   Activities of Daily Living (ADLs):   She states they are independent in the following: ambulation, dressing, bathing, hygiene, grooming, fixing food, toileting. States they require assistance with the following: none   Any transportation issues/concerns?: no   Any patient concerns? Not at this time.   Confirmed importance and date/time of follow-up visits scheduled yes  Provider Appointment booked with Despina Pole, NP on 05/13/2019.  Confirmed with patient if condition begins to worsen call PCP or go to the ER.  Patient was given the office number and encouraged to call back with question or concerns.  : yes

## 2019-05-12 ENCOUNTER — Other Ambulatory Visit: Payer: Self-pay

## 2019-05-13 ENCOUNTER — Ambulatory Visit (INDEPENDENT_AMBULATORY_CARE_PROVIDER_SITE_OTHER): Payer: PPO | Admitting: Primary Care

## 2019-05-13 ENCOUNTER — Encounter: Payer: Self-pay | Admitting: Primary Care

## 2019-05-13 VITALS — BP 166/88 | Wt 136.0 lb

## 2019-05-13 DIAGNOSIS — J441 Chronic obstructive pulmonary disease with (acute) exacerbation: Secondary | ICD-10-CM | POA: Diagnosis not present

## 2019-05-13 DIAGNOSIS — M25473 Effusion, unspecified ankle: Secondary | ICD-10-CM

## 2019-05-13 DIAGNOSIS — J449 Chronic obstructive pulmonary disease, unspecified: Secondary | ICD-10-CM | POA: Diagnosis not present

## 2019-05-13 DIAGNOSIS — I10 Essential (primary) hypertension: Secondary | ICD-10-CM | POA: Diagnosis not present

## 2019-05-13 MED ORDER — FLUTICASONE-SALMETEROL 250-50 MCG/DOSE IN AEPB: 1.0000 | INHALATION_SPRAY | Freq: Two times a day (BID) | RESPIRATORY_TRACT | 3 refills | Status: DC

## 2019-05-13 NOTE — Assessment & Plan Note (Signed)
She has an appointment with pulmonology scheduled for next week. She has no maintenance inhaler for which seems necessary at this time given recurrent need for albuterol, did very well with oral steroids so LABA/ICS combination inhaler should be appropriate.   Rx for Advair sent to pharmacy. Rinse mouth after use. Discussed to use albuterol PRN as this is a rescue inhaler only.

## 2019-05-13 NOTE — Assessment & Plan Note (Signed)
Admitted for COPD exacerbation on 05/05/19. Doing much better since discharge home on 05/07/19. She has an appointment with pulmonology scheduled for next week. She has no maintenance inhaler for which seems necessary at this time given recurrent need for albuterol, did very well with oral steroids so LABA/ICS combination inhaler should be appropriate.   Rx for Advair sent to pharmacy. Rinse mouth after use. Discussed to use albuterol PRN as this is a rescue inhaler only. Repeat labs pending.  Hospital notes, labs imaging reviewed.

## 2019-05-13 NOTE — Patient Instructions (Signed)
Start fluticasone-salmetrol (Advair) inhaler for COPD. Inhale 1 puff twice daily, everyday. Make sure to rinse your mouth with water after each use.  Use the albuterol inhaler only as needed for breakthrough shortness of breath and cough.  Follow up with pulmonology as scheduled.  Call the main line for the lab appointment as discussed.  It was a pleasure to see you today! [name], NP-C

## 2019-05-13 NOTE — Progress Notes (Signed)
Subjective:    [name], female    DOB: Feb 22, 1946, 73 y.o.   MRN: [id number]  HPI  Virtual Visit via Video Note  I connected with Morgan [name] on 05/13/19 at 11:40 AM EDT by a video enabled telemedicine application and verified that I am speaking with the correct person using two identifiers.  Location: Patient: Home Provider: Office   I discussed the limitations of evaluation and management by telemedicine and the availability of in person appointments. The patient expressed understanding and agreed to proceed.  History of Present Illness:  Morgan [name] is a 73 year old female who presents today for hospital follow up.   She initially presented to our office on 05/01/19 with complaints of exertional shortness of breath and productive cough. She had noticed improvement with using her husbands Spiriva inhaler, also started some old amoxicillin for which she had left over. Her symptoms were suspicious for COPD exacerbation so she was treated with Doxycycline antibiotics and albuterol.   She presented to [facility] on 05/05/19 with a two-three day history of dyspnea, shortness of breath without improvement on home inhalers. She was noted to be in acute respiratory failure with hypoxia so she was admitted for further treatment.  During her hospital stay she was treated with IV steroids, breathing treatments, IV antibiotics. Chest xray without pneumonia so IV antibiotics were discontinued. She was followed by both PT and OT. She improved with the above treatment, transitioned over to oral steroids. She was discharged home on 05/07/19 with recommendations for pulmonology outpatient follow up for PFT's.   Since her discharge home she scheduled an appointment with pulmonology for August 12th. She completed the oral prednisone three days ago and is doing much better. She's been checking her BP at home infrequently which ranges "low to high".   She has no rescue or maintenance  inhalers, had been using her husband's Spiriva prior to her hospital stay and endorses that this helped. She ran out of her albuterol inhaler several weeks ago until her recent visit on 05/01/19. She had been using her albuterol inhaler daily 4-5 times daily prior to her hospital stay, she is now using her albuterol 2-3 times daily. She has a chronic cough which has improved since her hospital visit, some mucous production. She does still notice bilateral ankle edema which has improved since removing Amlodipine.  She has not smoked since December 2019, stopped vaping one week ago. She is doing great, has had no cravings.   BP Readings from Last 3 Encounters:  05/13/19 (!) 166/88  05/07/19 (!) 144/84  05/01/19 (!) 149/79      Observations/Objective:  Alert and oriented. Appears well, not sickly. No distress. Speaking in complete sentences. No cough.  Assessment and Plan:  Admitted for COPD exacerbation on 05/05/19. Doing much better since discharge home on 05/07/19. She has an appointment with pulmonology scheduled for next week. She has no maintenance inhaler for which seems necessary at this time given recurrent need for albuterol, did very well with oral steroids so LABA/ICS combination inhaler should be appropriate.   Rx for Advair sent to pharmacy. Rinse mouth after use. Discussed to use albuterol PRN as this is a rescue inhaler only. Repeat labs pending. Check BNP for ankle edema, repeat potassium.  Hospital notes, labs imaging reviewed.  Follow Up Instructions:  Start fluticasone-salmetrol (Advair) inhaler for COPD. Inhale 1 puff twice daily, everyday. Make sure to rinse your mouth with water after each use.  Use  the albuterol inhaler only as needed for breakthrough shortness of breath and cough.  Follow up with pulmonology as scheduled.  Call the main line for the lab appointment as discussed.  It was a pleasure to see you today! [name], NP-C     I discussed  the assessment and treatment plan with the patient. The patient was provided an opportunity to ask questions and all were answered. The patient agreed with the plan and demonstrated an understanding of the instructions.   The patient was advised to call back or seek an in-person evaluation if the symptoms worsen or if the condition fails to improve as anticipated.     [name], NP    Review of Systems  Constitutional: Negative for chills, fatigue and fever.  HENT: Positive for congestion.   Respiratory: Positive for cough and shortness of breath. Negative for wheezing.   Neurological: Negative for dizziness and headaches.       Past Medical History:  Diagnosis Date  . COPD (chronic obstructive pulmonary disease) ([place])   . GERD (gastroesophageal reflux disease)   . Hyperlipidemia   . Hypertension   . Seasonal allergies      Social History   Socioeconomic History  . Marital status: Married    Spouse name: Not on file  . Number of children: Not on file  . Years of education: Not on file  . Highest education level: Not on file  Occupational History  . Not on file  Social Needs  . Financial resource strain: Not on file  . Food insecurity    Worry: Not on file    Inability: Not on file  . Transportation needs    Medical: Not on file    Non-medical: Not on file  Tobacco Use  . Smoking status: Former Smoker    Packs/day: 1.00    Years: 56.00    Pack years: 56.00    Types: E-cigarettes    Quit date: 09/08/2018    Years since quitting: 0.6  . Smokeless tobacco: Never Used  . Tobacco comment: stopped cigs Dec 2019  Substance and Sexual Activity  . Alcohol use: Yes    Alcohol/week: 0.0 standard drinks    Comment: 2 to 3 beer a day  . Drug use: No  . Sexual activity: Not on file  Lifestyle  . Physical activity    Days per week: Not on file    Minutes per session: Not on file  . Stress: Not on file  Relationships  . Social Herbalist on phone:  Not on file    Gets together: Not on file    Attends religious service: Not on file    Active member of club or organization: Not on file    Attends meetings of clubs or organizations: Not on file    Relationship status: Not on file  . Intimate partner violence    Fear of current or ex partner: Not on file    Emotionally abused: Not on file    Physically abused: Not on file    Forced sexual activity: Not on file  Other Topics Concern  . Not on file  Social History Narrative   Married.   2 children, 3 grandchildren.   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   Enjoys spending time with her family, going to the beach and movies.     Past Surgical History:  Procedure Laterality Date  . BACK SURGERY  Family History  Problem Relation Age of Onset  . Heart disease Father        before age 28    Allergies  Allergen Reactions  . Hctz [Hydrochlorothiazide] Other (See Comments)    Hyponatremia   . Amlodipine Swelling    Ankle/leg swelling  . Azithromycin Nausea And Vomiting  . Codeine Itching    Makes me crazy    Current Outpatient Medications on File Prior to Visit  Medication Sig Dispense Refill  . albuterol (PROAIR HFA) 108 (90 Base) MCG/ACT inhaler Inhale 2 puffs into the lungs every 4 (four) hours as needed for wheezing or shortness of breath. 6.7 g 1  . Cholecalciferol (D-3-5) 5000 UNITS capsule Take 5,000 Units by mouth daily.    Marland Kitchen denosumab (PROLIA) 60 MG/ML SOSY injection Inject 60 mg into the skin every 6 (six) months. Last administration June 2020    . hydrALAZINE (APRESOLINE) 50 MG tablet TAKE 1/2 TABLET EVERY MORNING AND 1 FULL TABLET IN THE EVENING FOR BLOOD PRESSURE. (Patient taking differently: Take 25-50 mg by mouth See admin instructions. Take 1/2 tablet (25 mg) every morning and 1 full tablet (50 mg) in the evening for blood pressure.) 45 tablet 1  . ibuprofen (ADVIL,MOTRIN) 200 MG tablet Take 400-600 mg by mouth every 8 (eight) hours as needed for  mild pain or moderate pain.    Marland Kitchen irbesartan (AVAPRO) 300 MG tablet TAKE 1 TABLET (300 MG TOTAL) BY MOUTH DAILY. FOR BLOOD PRESSURE. 90 tablet 0  . metoprolol succinate (TOPROL-XL) 25 MG 24 hr tablet Take 1 tablet (25 mg total) by mouth daily. For blood pressure. 90 tablet 1  . Multiple Vitamins-Minerals (MULTIVITAMIN GUMMIES ADULTS) CHEW Chew 2 tablets by mouth daily.     Marland Kitchen omeprazole (PRILOSEC) 20 MG capsule Take 1 capsule (20 mg total) by mouth daily. For heartburn. 90 capsule 3  . polyethylene glycol powder (GLYCOLAX/MIRALAX) powder Mix 17 g into water once to twice daily as needed for constipation. 3350 g 0  . simvastatin (ZOCOR) 40 MG tablet Take 1 tablet (40 mg total) by mouth every evening. For cholesterol. 90 tablet 1  . venlafaxine XR (EFFEXOR-XR) 75 MG 24 hr capsule Take 1 capsule (75 mg total) by mouth daily with breakfast. For anxiety/depression. 90 capsule 1  . vitamin C (ASCORBIC ACID) 500 MG tablet Take 500 mg by mouth daily.     No current facility-administered medications on file prior to visit.     BP (!) 166/88   Wt 136 lb (61.7 kg)   BMI 25.70 kg/m    Objective:   Physical Exam  Constitutional: She is oriented to person, place, and time. She appears well-nourished. She does not have a sickly appearance. She does not appear ill.  Respiratory: Effort normal. No respiratory distress. She has no wheezes.  No cough  Neurological: She is alert and oriented to person, place, and time.  Psychiatric: She has a normal mood and affect.           Assessment & Plan:

## 2019-05-13 NOTE — Assessment & Plan Note (Signed)
Compliant to prescribed regimen, not closely monitoring her BP at home. Discussed to monitor more regularly.  Consider pulmonary hypertension as a diagnosis given difficulty controlling BP. She will be seeing pulmonology soon. Consider echo if this is not addressed during her upcoming visit.

## 2019-05-14 ENCOUNTER — Other Ambulatory Visit (INDEPENDENT_AMBULATORY_CARE_PROVIDER_SITE_OTHER): Payer: PPO

## 2019-05-14 DIAGNOSIS — I10 Essential (primary) hypertension: Secondary | ICD-10-CM | POA: Diagnosis not present

## 2019-05-14 DIAGNOSIS — M25473 Effusion, unspecified ankle: Secondary | ICD-10-CM

## 2019-05-14 LAB — BASIC METABOLIC PANEL
BUN: 11 mg/dL (ref 6–23)
CO2: 29 mEq/L (ref 19–32)
Calcium: 9.2 mg/dL (ref 8.4–10.5)
Chloride: 97 mEq/L (ref 96–112)
Creatinine, Ser: 0.75 mg/dL (ref 0.40–1.20)
GFR: 76.04 mL/min (ref >60.00)
Glucose, Bld: 131 mg/dL — ABNORMAL HIGH (ref 70–99)
Potassium: 4 mEq/L (ref 3.5–5.1)
Sodium: 133 mEq/L — ABNORMAL LOW (ref 135–145)

## 2019-05-14 LAB — BRAIN NATRIURETIC PEPTIDE: Pro B Natriuretic peptide (BNP): 23 pg/mL (ref 0.0–100.0)

## 2019-05-21 ENCOUNTER — Encounter: Payer: Self-pay | Admitting: Pulmonary Disease

## 2019-05-21 ENCOUNTER — Ambulatory Visit: Payer: PPO | Admitting: Pulmonary Disease

## 2019-05-21 ENCOUNTER — Other Ambulatory Visit: Payer: Self-pay

## 2019-05-21 VITALS — BP 126/78 | HR 74 | Temp 98.3°F | Ht 61.0 in | Wt 138.8 lb

## 2019-05-21 DIAGNOSIS — J449 Chronic obstructive pulmonary disease, unspecified: Secondary | ICD-10-CM | POA: Diagnosis not present

## 2019-05-21 LAB — CBC WITH DIFFERENTIAL/PLATELET
Basophils Absolute: 0.1 10*3/uL (ref 0.0–0.1)
Basophils Relative: 1.2 % (ref 0.0–3.0)
Eosinophils Absolute: 0.3 10*3/uL (ref 0.0–0.7)
Eosinophils Relative: 5.1 % — ABNORMAL HIGH (ref 0.0–5.0)
HCT: 33.6 % — ABNORMAL LOW (ref 36.0–46.0)
Hemoglobin: 11.3 g/dL — ABNORMAL LOW (ref 12.0–15.0)
Lymphocytes Relative: 26.2 % (ref 12.0–46.0)
Lymphs Abs: 1.4 10*3/uL (ref 0.7–4.0)
MCHC: 33.5 g/dL (ref 30.0–36.0)
MCV: 96.7 fl (ref 78.0–100.0)
Monocytes Absolute: 0.6 10*3/uL (ref 0.1–1.0)
Monocytes Relative: 12.1 % — ABNORMAL HIGH (ref 3.0–12.0)
Neutro Abs: 2.9 10*3/uL (ref 1.4–7.7)
Neutrophils Relative %: 55.4 % (ref 43.0–77.0)
Platelets: 342 10*3/uL (ref 150.0–400.0)
RBC: 3.48 Mil/uL — ABNORMAL LOW (ref 3.87–5.11)
RDW: 13.1 % (ref 11.5–15.5)
WBC: 5.3 10*3/uL (ref 4.0–10.5)

## 2019-05-21 NOTE — Patient Instructions (Signed)
I am glad you are doing well with your breathing Continue with the inhalers as prescribed We will get labs today including CBC with differential, IgE, alpha-1 antitrypsin levels and phenotype We will schedule you for pulmonary function test Follow-up in 1 to 2 months.

## 2019-05-21 NOTE — Addendum Note (Signed)
Addended by: Suzzanne Cloud E on: 05/21/2019 12:05 PM   Modules accepted: Orders

## 2019-05-21 NOTE — Progress Notes (Signed)
Morgan [name]    [id number]    05/30/1946  Primary Care Physician:Clark, [name], NP  Referring Physician: [name], NP [place],  [place] [id number]  Chief complaint: Consult for COPD  HPI: 73 year old with history of COPD, GERD, hypertension, allergies, ex-smoker admitted in July 2020 for COPD exacerbation which is treated with steroids and nebulizer.  Started on Advair as an outpatient after discharge and referred to pulmonary for further evaluation  Pets: No pets Occupation: Retired Network engineer.  Used to work in [employer] when she was young Exposures: No known exposures, no mold, hot tub, Jacuzzi Smoking history: 50-75-pack-year smoker.  Quit in December 2019.  Took Vaping after quitting cigarettes and quit vaping in July 2020 Travel history: No significant travel history Relevant family history: No significant family history of lung disease  Outpatient Encounter Medications as of 05/21/2019  Medication Sig  . albuterol (PROAIR HFA) 108 (90 Base) MCG/ACT inhaler Inhale 2 puffs into the lungs every 4 (four) hours as needed for wheezing or shortness of breath.  . Cholecalciferol (D-3-5) 5000 UNITS capsule Take 5,000 Units by mouth daily.  Marland Kitchen denosumab (PROLIA) 60 MG/ML SOSY injection Inject 60 mg into the skin every 6 (six) months. Last administration June 2020  . Fluticasone-Salmeterol (ADVAIR DISKUS) 250-50 MCG/DOSE AEPB Inhale 1 puff into the lungs 2 (two) times daily.  . hydrALAZINE (APRESOLINE) 50 MG tablet TAKE 1/2 TABLET EVERY MORNING AND 1 FULL TABLET IN THE EVENING FOR BLOOD PRESSURE. (Patient taking differently: Take 25-50 mg by mouth See admin instructions. Take 1/2 tablet (25 mg) every morning and 1 full tablet (50 mg) in the evening for blood pressure.)  . ibuprofen (ADVIL,MOTRIN) 200 MG tablet Take 400-600 mg by mouth every 8 (eight) hours as needed for mild pain or moderate pain.  Marland Kitchen irbesartan (AVAPRO) 300 MG tablet TAKE 1 TABLET  (300 MG TOTAL) BY MOUTH DAILY. FOR BLOOD PRESSURE.  . metoprolol succinate (TOPROL-XL) 25 MG 24 hr tablet Take 1 tablet (25 mg total) by mouth daily. For blood pressure.  . Multiple Vitamins-Minerals (MULTIVITAMIN GUMMIES ADULTS) CHEW Chew 2 tablets by mouth daily.   Marland Kitchen omeprazole (PRILOSEC) 20 MG capsule Take 1 capsule (20 mg total) by mouth daily. For heartburn.  . polyethylene glycol powder (GLYCOLAX/MIRALAX) powder Mix 17 g into water once to twice daily as needed for constipation.  . simvastatin (ZOCOR) 40 MG tablet Take 1 tablet (40 mg total) by mouth every evening. For cholesterol.  . venlafaxine XR (EFFEXOR-XR) 75 MG 24 hr capsule Take 1 capsule (75 mg total) by mouth daily with breakfast. For anxiety/depression.  . vitamin C (ASCORBIC ACID) 500 MG tablet Take 500 mg by mouth daily.   No facility-administered encounter medications on file as of 05/21/2019.     Allergies as of 05/21/2019 - Review Complete 05/21/2019  Allergen Reaction Noted  . Hctz [hydrochlorothiazide] Other (See Comments) 08/23/2018  . Amlodipine Swelling 01/06/2019  . Azithromycin Nausea And Vomiting 10/15/2015  . Codeine Itching 03/24/2012    Past Medical History:  Diagnosis Date  . COPD (chronic obstructive pulmonary disease) ([place])   . GERD (gastroesophageal reflux disease)   . Hyperlipidemia   . Hypertension   . Seasonal allergies     Past Surgical History:  Procedure Laterality Date  . BACK SURGERY      Family History  Problem Relation Age of Onset  . Heart disease Father  before age 62    Social History   Socioeconomic History  . Marital status: Married    Spouse name: Not on file  . Number of children: Not on file  . Years of education: Not on file  . Highest education level: Not on file  Occupational History  . Not on file  Social Needs  . Financial resource strain: Not on file  . Food insecurity    Worry: Not on file    Inability: Not on file  . Transportation needs     Medical: Not on file    Non-medical: Not on file  Tobacco Use  . Smoking status: Former Smoker    Packs/day: 1.00    Years: 56.00    Pack years: 56.00    Types: E-cigarettes    Quit date: 09/08/2018    Years since quitting: 0.6  . Smokeless tobacco: Never Used  . Tobacco comment: stopped cigs Dec 2019  Substance and Sexual Activity  . Alcohol use: Yes    Alcohol/week: 0.0 standard drinks    Comment: 2 to 3 beer a day  . Drug use: No  . Sexual activity: Not on file  Lifestyle  . Physical activity    Days per week: Not on file    Minutes per session: Not on file  . Stress: Not on file  Relationships  . Social Herbalist on phone: Not on file    Gets together: Not on file    Attends religious service: Not on file    Active member of club or organization: Not on file    Attends meetings of clubs or organizations: Not on file    Relationship status: Not on file  . Intimate partner violence    Fear of current or ex partner: Not on file    Emotionally abused: Not on file    Physically abused: Not on file    Forced sexual activity: Not on file  Other Topics Concern  . Not on file  Social History Narrative   Married.   2 children, 3 grandchildren.   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   Enjoys spending time with her family, going to the beach and movies.     Review of systems: Review of Systems  Constitutional: Negative for fever and chills.  HENT: Negative.   Eyes: Negative for blurred vision.  Respiratory: as per HPI  Cardiovascular: Negative for chest pain and palpitations.  Gastrointestinal: Negative for vomiting, diarrhea, blood per rectum. Genitourinary: Negative for dysuria, urgency, frequency and hematuria.  Musculoskeletal: Negative for myalgias, back pain and joint pain.  Skin: Negative for itching and rash.  Neurological: Negative for dizziness, tremors, focal weakness, seizures and loss of consciousness.  Endo/Heme/Allergies: Negative  for environmental allergies.  Psychiatric/Behavioral: Negative for depression, suicidal ideas and hallucinations.  All other systems reviewed and are negative.  Physical Exam: Blood pressure 126/78, pulse 74, temperature 98.3 F (36.8 C), temperature source Oral, height 5\' 1"  (1.549 m), weight 138 lb 12.8 oz (63 kg), SpO2 95 %. Gen:      No acute distress HEENT:  EOMI, sclera anicteric Neck:     No masses; no thyromegaly Lungs:    Clear to auscultation bilaterally; normal respiratory effort CV:         Regular rate and rhythm; no murmurs Abd:      + bowel sounds; soft, non-tender; no palpable masses, no distension Ext:    No edema; adequate peripheral perfusion Skin:  Warm and dry; no rash Neuro: alert and oriented x 3 Psych: normal mood and affect  Data Reviewed: Imaging: Low-dose screening CT 09/10/2018- emphysema, subcentimeter pulmonary nodules measuring less than 3 mm.  Aortic atherosclerosis, coronary artery calcification. Chest x-ray 05/05/2019- mild basal atelectasis. I have reviewed the images personally.  Labs: CBC 05/05/2019-WBC 9.3, eos 13%, absolute eosinophil count 1209  Assessment:  COPD Likely has significant COPD based on smoking history.  She had a recent exacerbation and is recovering well.  Suspect she may have a component of asthma as well given elevated peripheral eosinophilia Started on Advair a few weeks ago.  We will continue the same  Check CBC differential, IgE, alpha-1 antitrypsin levels and phenotype Scheduled for pulmonary function test.  Health maintenance 10/11/2016-Prevnar  Plan/Recommendations: - Continue Advair - CBC, IgE, alpha-1 antitrypsin - PFTs  Marshell Garfinkel MD [facility] 05/21/2019, 11:26 AM  CC: [name], NP

## 2019-05-27 ENCOUNTER — Emergency Department (HOSPITAL_COMMUNITY)
Admission: EM | Admit: 2019-05-27 | Discharge: 2019-05-27 | Disposition: A | Discharge status: Home / Self Care | Payer: PPO | Attending: Emergency Medicine | Admitting: Emergency Medicine

## 2019-05-27 ENCOUNTER — Emergency Department (HOSPITAL_COMMUNITY): Payer: PPO

## 2019-05-27 ENCOUNTER — Encounter (HOSPITAL_COMMUNITY): Payer: Self-pay | Admitting: Emergency Medicine

## 2019-05-27 DIAGNOSIS — Z87891 Personal history of nicotine dependence: Secondary | ICD-10-CM | POA: Insufficient documentation

## 2019-05-27 DIAGNOSIS — Z79899 Other long term (current) drug therapy: Secondary | ICD-10-CM | POA: Diagnosis not present

## 2019-05-27 DIAGNOSIS — I10 Essential (primary) hypertension: Secondary | ICD-10-CM | POA: Diagnosis not present

## 2019-05-27 DIAGNOSIS — S22039A Unspecified fracture of third thoracic vertebra, initial encounter for closed fracture: Secondary | ICD-10-CM | POA: Diagnosis not present

## 2019-05-27 DIAGNOSIS — Y999 Unspecified external cause status: Secondary | ICD-10-CM | POA: Diagnosis not present

## 2019-05-27 DIAGNOSIS — Y92410 Unspecified street and highway as the place of occurrence of the external cause: Secondary | ICD-10-CM | POA: Diagnosis not present

## 2019-05-27 DIAGNOSIS — M542 Cervicalgia: Secondary | ICD-10-CM | POA: Diagnosis not present

## 2019-05-27 DIAGNOSIS — J449 Chronic obstructive pulmonary disease, unspecified: Secondary | ICD-10-CM | POA: Diagnosis not present

## 2019-05-27 DIAGNOSIS — Y9389 Activity, other specified: Secondary | ICD-10-CM | POA: Insufficient documentation

## 2019-05-27 DIAGNOSIS — S22030A Wedge compression fracture of third thoracic vertebra, initial encounter for closed fracture: Secondary | ICD-10-CM | POA: Diagnosis not present

## 2019-05-27 DIAGNOSIS — S299XXA Unspecified injury of thorax, initial encounter: Secondary | ICD-10-CM | POA: Diagnosis not present

## 2019-05-27 DIAGNOSIS — S3992XA Unspecified injury of lower back, initial encounter: Secondary | ICD-10-CM | POA: Diagnosis not present

## 2019-05-27 DIAGNOSIS — R52 Pain, unspecified: Secondary | ICD-10-CM | POA: Diagnosis not present

## 2019-05-27 DIAGNOSIS — M545 Low back pain: Secondary | ICD-10-CM | POA: Diagnosis not present

## 2019-05-27 DIAGNOSIS — S199XXA Unspecified injury of neck, initial encounter: Secondary | ICD-10-CM | POA: Diagnosis not present

## 2019-05-27 DIAGNOSIS — M546 Pain in thoracic spine: Secondary | ICD-10-CM | POA: Diagnosis not present

## 2019-05-27 LAB — IGE: IgE (Immunoglobulin E), Serum: 126 kU/L — ABNORMAL HIGH (ref <=114)

## 2019-05-27 LAB — ALPHA-1 ANTITRYPSIN PHENOTYPE: A-1 Antitrypsin, Ser: 135 mg/dL (ref 83–199)

## 2019-05-27 MED ORDER — ACETAMINOPHEN 325 MG PO TABS
650.0000 mg | ORAL_TABLET | Freq: Once | ORAL | Status: AC
  Administered 2019-05-27: 650 mg via ORAL
  Filled 2019-05-27: qty 2

## 2019-05-27 NOTE — ED Notes (Signed)
Patient transported to MRI 

## 2019-05-27 NOTE — ED Notes (Signed)
Returned from CT.

## 2019-05-27 NOTE — Discharge Instructions (Addendum)
°  Return to the emergency department immediately if you develop any of the following symptoms: You have numbness, tingling, or weakness in the arms or legs. You develop severe headaches not relieved with medicine. You have severe neck pain, especially tenderness in the middle of the back of your neck. You have changes in bowel or bladder control. There is increasing pain in any area of the body. You have shortness of breath, light-headedness, dizziness, or fainting. You have chest pain. You feel sick to your stomach (nauseous), throw up (vomit), or sweat. You have increasing abdominal discomfort. There is blood in your urine, stool, or vomit. You have pain in your shoulder (shoulder strap areas). You feel your symptoms are getting worse.      Contact a health care provider if: You have a fever. You develop a cough that makes your pain worse. Your pain medicine is not helping. Your pain does not get better over time. You cannot return to your normal activities as planned or expected. Get help right away if: Your pain is very bad and it suddenly gets worse. You are unable to move any part of your body (paralysis) that is below the level of your injury. You have numbness, tingling, or weakness in any part of your body that is below the level of your injury. You cannot control your bladder or bowels.
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
## 2019-06-03 ENCOUNTER — Other Ambulatory Visit: Payer: Self-pay | Admitting: Pharmacist
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
## 2019-06-10 ENCOUNTER — Ambulatory Visit: Payer: Self-pay | Admitting: Pharmacist

## 2019-06-10 ENCOUNTER — Other Ambulatory Visit: Payer: Self-pay | Admitting: Pharmacist
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
## 2019-06-13 ENCOUNTER — Ambulatory Visit: Payer: Self-pay | Admitting: Pharmacist

## 2019-06-13 ENCOUNTER — Other Ambulatory Visit: Payer: Self-pay | Admitting: Pharmacy Technician

## 2019-06-13 ENCOUNTER — Other Ambulatory Visit: Payer: Self-pay | Admitting: Pharmacist

## 2019-06-13 DIAGNOSIS — M47812 Spondylosis without myelopathy or radiculopathy, cervical region: Secondary | ICD-10-CM | POA: Diagnosis not present

## 2019-06-13 NOTE — Patient Outreach (Signed)
[place] [facility]) Care Management  06/13/2019  Morgan [name] [facility] 1946-06-29 [id number]                                       Medication Assistance Referral  Referral From: [place]  Medication/Company: Ruthe Mannan and Proventil HFA / [employer] Patient application portion:  Education officer, museum portion: Interoffice Mailed to Dr. Vaughan Browner Provider address/fax verified via: Office website  Follow up:  Will follow up with patient in 7-10 business days to confirm application(s) have been received.  Maud Deed Chana Bode [place] Certified Pharmacy Technician [place] Management Direct Dial:[phone]

## 2019-06-13 NOTE — Patient Outreach (Signed)
[facility]) Care Management  [place]   06/13/2019  Latina Frank [facility] 05-Nov-1945 [id number]  Reason for referral: Medication Assistance  Referral source: [facility] Current insurance: [employer] Ad[employer]age  PMHx includes but not limited to:  COPD, hx tobacco abuse (quit smoking 09/2018, quit vaping July 27), GERD, anxiety / depression, HTN, HLD  Outreach:  Successful telephone call with Mr. Gossett.  HIPAA identifiers verified.   Subjective:  Patient reports she has a high co-pay for Advair ($45). She reports is not in the coverage gap yet.  Patient receives she receives the Prolia injection every 6 months at her PCP office.  She pays for $250 / injection in a lump sum co-pay to PCP.  She states she is using Advair PRN   Proventil, Advair--> Dulera:  [place]  Objective: The 10-year ASCVD risk score Mikey Bussing DC Jr., et al., 2013) is: 16.4%   Values used to calculate the score:     Age: 4 years     Sex: Female     Is Non-Hispanic African American: No     Diabetic: No     Tobacco smoker: No     Systolic Blood Pressure: 409 mmHg     Is BP treated: Yes     HDL Cholesterol: 94 mg/dL     Total Cholesterol: 183 mg/dL  Lab Results  Component Value Date   CREATININE 0.75 05/14/2019   CREATININE 0.63 05/07/2019   CREATININE 0.71 05/06/2019    Lab Results  Component Value Date   HGBA1C 5.5 03/05/2019    Lipid Panel     Component Value Date/Time   CHOL 183 08/09/2018 1218   TRIG 100.0 08/09/2018 1218   HDL 94.00 08/09/2018 1218   CHOLHDL 2 08/09/2018 1218   VLDL 20.0 08/09/2018 1218   LDLCALC 69 08/09/2018 1218    BP Readings from Last 3 Encounters:  05/27/19 (!) 147/78  05/21/19 126/78  05/13/19 (!) 166/88    Allergies  Allergen Reactions  . Hctz [Hydrochlorothiazide] Other (See Comments)    Hyponatremia   . Amlodipine Swelling    Ankle/leg swelling  . Azithromycin Nausea And Vomiting  . Codeine Itching    Makes me crazy    Medications  Reviewed Today    Reviewed by Ivery Quale, CMA (Certified Medical Assistant) on 05/21/19 at 1121  Med List Status: <None>  Medication Order Taking? Sig Documenting Provider Last Dose Status Informant  albuterol (PROAIR HFA) 108 (90 Base) MCG/ACT inhaler [id number] Yes Inhale 2 puffs into the lungs every 4 (four) hours as needed for wheezing or shortness of breath. Lesleigh Noe, MD Taking Active Multiple Informants  Cholecalciferol (D-3-5) 5000 UNITS capsule [id number] Yes Take 5,000 Units by mouth daily. [provider] Taking Active Self  denosumab (PROLIA) 60 MG/ML SOSY injection [id number] Yes Inject 60 mg into the skin every 6 (six) months. Last administration June 2020 [provider] Taking Active Multiple Informants  Fluticasone-Salmeterol (ADVAIR DISKUS) 250-50 MCG/DOSE AEPB [id number] Yes Inhale 1 puff into the lungs 2 (two) times daily. [name], NP Taking Active   hydrALAZINE (APRESOLINE) 50 MG tablet [id number] Yes TAKE 1/2 TABLET EVERY MORNING AND 1 FULL TABLET IN THE EVENING FOR BLOOD PRESSURE.  Patient taking differently: Take 25-50 mg by mouth See admin instructions. Take 1/2 tablet (25 mg) every morning and 1 full tablet (50 mg) in the evening for blood pressure.   [name], NP Taking Active Multiple Informants  ibuprofen (ADVIL,MOTRIN) 200 MG tablet  [id number] Yes Take 400-600 mg by mouth every 8 (eight) hours as needed for mild pain or moderate pain. [provider] Taking Active Self  irbesartan (AVAPRO) 300 MG tablet [id number] Yes TAKE 1 TABLET (300 MG TOTAL) BY MOUTH DAILY. FOR BLOOD PRESSURE. [name], NP Taking Active Multiple Informants  metoprolol succinate (TOPROL-XL) 25 MG 24 hr tablet [id number] Yes Take 1 tablet (25 mg total) by mouth daily. For blood pressure. [name], NP Taking Active Multiple Informants  Multiple Vitamins-Minerals (MULTIVITAMIN GUMMIES ADULTS) CHEW [id number] Yes Chew 2 tablets by mouth  daily.  [provider] Taking Active Self  omeprazole (PRILOSEC) 20 MG capsule [id number] Yes Take 1 capsule (20 mg total) by mouth daily. For heartburn. [name], NP Taking Active Multiple Informants  polyethylene glycol powder (GLYCOLAX/MIRALAX) powder [id number] Yes Mix 17 g into water once to twice daily as needed for constipation. [name], NP Taking Active Multiple Informants  simvastatin (ZOCOR) 40 MG tablet [id number] Yes Take 1 tablet (40 mg total) by mouth every evening. For cholesterol. [name], NP Taking Active Multiple Informants  venlafaxine XR (EFFEXOR-XR) 75 MG 24 hr capsule [id number] Yes Take 1 capsule (75 mg total) by mouth daily with breakfast. For anxiety/depression. [name], NP Taking Active Multiple Informants  vitamin C (ASCORBIC ACID) 500 MG tablet [id number] Yes Take 500 mg by mouth daily. [provider] Taking Active Self          Assessment: Drugs sorted by system:  Neurologic/Psychologic:venlafaxine  Cardiovascular: hydralazine, irbesartan, metoprolol, simvastatin  Pulmonary/Allergy: albuterol inhaler, fluticasone-salmeterol inhaler  Gastrointestinal: omeprazole, polyethylene glycol  Pain: ibuprofen  Vitamins/Minerals/Supplements: cholecalciferol, MVI, vitamin C  Miscellaneous: denosumab   Medication Assistance Findings:  Medication assistance needs identified: Advair  Extra Help:  Not eligible for Extra Help Low Income Subsidy based on reported income and assets  Patient Assistance Programs: Advair made by [place] requirement met: Yes o Out-of-pocket prescription expenditure met:   No ($600)) - Patient has not met application requirements to apply for this program at this time.  - Alternative option is to apply for a similar product in the same therapeutic class which does not have a required out of pocket expenditure.  A possible substitution is  Dulera  made by [employer] .  - Reviewed  program requirements with patient.   - Can include Proventil with application as made by [employer] as well  Plan: . I will route patient assistance letter to [place] technician who will coordinate patient assistance program application process for medications listed above.  [facility] pharmacy technician will assist with obtaining all required documents from both patient and provider(s) and submit application(s) once completed.    Ralene Bathe, PharmD, [place] [phone]

## 2019-06-17 ENCOUNTER — Other Ambulatory Visit: Payer: Self-pay | Admitting: Primary Care

## 2019-06-17 NOTE — Telephone Encounter (Signed)
Mail order has change and updated

## 2019-06-23 ENCOUNTER — Other Ambulatory Visit: Payer: Self-pay | Admitting: Neurological Surgery

## 2019-06-23 ENCOUNTER — Other Ambulatory Visit: Payer: Self-pay | Admitting: Family Medicine

## 2019-06-23 ENCOUNTER — Other Ambulatory Visit: Payer: Self-pay | Admitting: Primary Care

## 2019-06-23 DIAGNOSIS — J441 Chronic obstructive pulmonary disease with (acute) exacerbation: Secondary | ICD-10-CM

## 2019-06-23 DIAGNOSIS — I10 Essential (primary) hypertension: Secondary | ICD-10-CM

## 2019-06-23 DIAGNOSIS — M47812 Spondylosis without myelopathy or radiculopathy, cervical region: Secondary | ICD-10-CM

## 2019-06-23 DIAGNOSIS — F329 Major depressive disorder, single episode, unspecified: Secondary | ICD-10-CM

## 2019-06-23 DIAGNOSIS — J069 Acute upper respiratory infection, unspecified: Secondary | ICD-10-CM

## 2019-06-23 DIAGNOSIS — F32A Depression, unspecified: Secondary | ICD-10-CM

## 2019-06-24 MED ORDER — VENLAFAXINE HCL ER 75 MG PO CP24: 75.0000 mg | ORAL_CAPSULE | Freq: Every day | ORAL | 1 refills | Status: DC

## 2019-06-24 MED ORDER — IRBESARTAN 300 MG PO TABS: 300.0000 mg | ORAL_TABLET | Freq: Every day | ORAL | 1 refills | Status: DC

## 2019-06-25 ENCOUNTER — Other Ambulatory Visit: Payer: Self-pay | Admitting: Primary Care

## 2019-06-25 DIAGNOSIS — I10 Essential (primary) hypertension: Secondary | ICD-10-CM

## 2019-07-02 ENCOUNTER — Other Ambulatory Visit: Payer: Self-pay | Admitting: Pharmacy Technician

## 2019-07-02 DIAGNOSIS — Z1231 Encounter for screening mammogram for malignant neoplasm of breast: Secondary | ICD-10-CM | POA: Diagnosis not present

## 2019-07-02 LAB — HM MAMMOGRAPHY

## 2019-07-02 NOTE — Patient Outreach (Signed)
[place] [facility]) Care Management  07/02/2019  Eylin String [facility] 07-25-1946 [id number]    Unsuccessful call placed to patient regarding patient assistance application(s) for [facility] and Proventil HFA , detailed voicemail left.   Follow up:  Will make additional call attempt in 3-5 business days if call has not been returned.  Maud Deed Chana Bode [place] Certified Pharmacy Technician [place] Management Direct Dial:[phone]

## 2019-07-04 ENCOUNTER — Encounter: Payer: Self-pay | Admitting: Primary Care

## 2019-07-04 DIAGNOSIS — H2513 Age-related nuclear cataract, bilateral: Secondary | ICD-10-CM | POA: Diagnosis not present

## 2019-07-10 ENCOUNTER — Other Ambulatory Visit: Payer: Self-pay | Admitting: Pharmacy Technician

## 2019-07-10 DIAGNOSIS — J449 Chronic obstructive pulmonary disease, unspecified: Secondary | ICD-10-CM | POA: Diagnosis not present

## 2019-07-10 DIAGNOSIS — H2512 Age-related nuclear cataract, left eye: Secondary | ICD-10-CM | POA: Diagnosis not present

## 2019-07-10 NOTE — Patient Outreach (Signed)
[place] [facility]) Care Management  07/10/2019  Morgan [name] [facility] 12/24/1945 [id number]    Successful call placed to patient regarding patient assistance application(s) for [facility] and Proventil HFA , HIPAA identifiers verified. [name] confirms that she received [employer] patient assistance application. She states that she has not had a chance to fill application out and that she hopes to by the beginning of next week. Reviewed application with patient.  Follow up:  Will route note to [facility] for case closure if documents have not been received in the next 15 business days.  Maud Deed Chana Bode [place] Certified Pharmacy Technician [place] Management Direct Dial:[phone]

## 2019-07-11 ENCOUNTER — Other Ambulatory Visit: Payer: Self-pay | Admitting: Primary Care

## 2019-07-11 DIAGNOSIS — I10 Essential (primary) hypertension: Secondary | ICD-10-CM

## 2019-07-12 ENCOUNTER — Other Ambulatory Visit: Payer: Self-pay

## 2019-07-12 ENCOUNTER — Ambulatory Visit
Admission: RE | Admit: 2019-07-12 | Discharge: 2019-07-12 | Disposition: A | Discharge status: Home / Self Care | Payer: PPO | Source: Ambulatory Visit | Attending: Neurological Surgery | Admitting: Neurological Surgery

## 2019-07-12 DIAGNOSIS — M4802 Spinal stenosis, cervical region: Secondary | ICD-10-CM | POA: Diagnosis not present

## 2019-07-12 DIAGNOSIS — M47812 Spondylosis without myelopathy or radiculopathy, cervical region: Secondary | ICD-10-CM

## 2019-07-14 NOTE — Telephone Encounter (Signed)
Pt is out of irbesartan 300 mg and is waiting on refill [facility] [place] [facility]; pt spoke with them today and irbesartan has not been shipped out yet. Pt request # 15 to [facility] until get [place] supply; advised pt done. Pt voiced understanding and appreciative.

## 2019-07-18 ENCOUNTER — Other Ambulatory Visit
Admission: RE | Admit: 2019-07-18 | Discharge: 2019-07-18 | Disposition: A | Discharge status: Home / Self Care | Payer: PPO | Source: Ambulatory Visit | Attending: Ophthalmology | Admitting: Ophthalmology

## 2019-07-18 DIAGNOSIS — Z20828 Contact with and (suspected) exposure to other viral communicable diseases: Secondary | ICD-10-CM | POA: Diagnosis not present

## 2019-07-18 DIAGNOSIS — Z6827 Body mass index (BMI) 27.0-27.9, adult: Secondary | ICD-10-CM | POA: Diagnosis not present

## 2019-07-18 DIAGNOSIS — Z01812 Encounter for preprocedural laboratory examination: Secondary | ICD-10-CM | POA: Diagnosis not present

## 2019-07-18 DIAGNOSIS — R030 Elevated blood-pressure reading, without diagnosis of hypertension: Secondary | ICD-10-CM | POA: Diagnosis not present

## 2019-07-18 DIAGNOSIS — M47812 Spondylosis without myelopathy or radiculopathy, cervical region: Secondary | ICD-10-CM | POA: Diagnosis not present

## 2019-07-18 LAB — SARS CORONAVIRUS 2 (TAT 6-24 HRS): SARS Coronavirus 2: NEGATIVE

## 2019-07-21 NOTE — Discharge Instructions (Signed)

## 2019-07-22 NOTE — Anesthesia Preprocedure Evaluation (Addendum)
Anesthesia Evaluation  Patient identified by MRN, date of birth, ID band Patient awake    Reviewed: Allergy & Precautions, NPO status , Patient's Chart, lab work & pertinent test results  History of Anesthesia Complications Negative for: history of anesthetic complications  Airway Mallampati: III   Neck ROM: Full    Dental  (+)    Pulmonary COPD, former smoker (quit 09/2018),    Pulmonary exam normal breath sounds clear to auscultation       Cardiovascular hypertension, Normal cardiovascular exam Rhythm:Regular Rate:Normal     Neuro/Psych PSYCHIATRIC DISORDERS Depression HOH    GI/Hepatic GERD  ,  Endo/Other  negative endocrine ROS  Renal/GU negative Renal ROS     Musculoskeletal   Abdominal   Peds  Hematology negative hematology ROS (+)   Anesthesia Other Findings   Reproductive/Obstetrics                            Anesthesia Physical Anesthesia Plan  ASA: II  Anesthesia Plan: MAC   Post-op Pain Management:    Induction: Intravenous  PONV Risk Score and Plan: 2 and TIVA and Midazolam  Airway Management Planned: Natural Airway  Additional Equipment:   Intra-op Plan:   Post-operative Plan:   Informed Consent: I have reviewed the patients History and Physical, chart, labs and discussed the procedure including the risks, benefits and alternatives for the proposed anesthesia with the patient or authorized representative who has indicated his/her understanding and acceptance.       Plan Discussed with: CRNA  Anesthesia Plan Comments:        Anesthesia Quick Evaluation

## 2019-07-23 ENCOUNTER — Ambulatory Visit: Payer: PPO | Admitting: Anesthesiology

## 2019-07-23 ENCOUNTER — Encounter
Admission: RE | Disposition: A | Discharge status: Home / Self Care | Payer: Self-pay | Source: Home / Self Care | Attending: Ophthalmology

## 2019-07-23 ENCOUNTER — Ambulatory Visit
Admission: RE | Admit: 2019-07-23 | Discharge: 2019-07-23 | Disposition: A | Discharge status: Home / Self Care | Payer: PPO | Attending: Ophthalmology | Admitting: Ophthalmology

## 2019-07-23 ENCOUNTER — Other Ambulatory Visit: Payer: Self-pay

## 2019-07-23 DIAGNOSIS — H9190 Unspecified hearing loss, unspecified ear: Secondary | ICD-10-CM | POA: Diagnosis not present

## 2019-07-23 DIAGNOSIS — K219 Gastro-esophageal reflux disease without esophagitis: Secondary | ICD-10-CM | POA: Insufficient documentation

## 2019-07-23 DIAGNOSIS — H2512 Age-related nuclear cataract, left eye: Secondary | ICD-10-CM | POA: Diagnosis not present

## 2019-07-23 DIAGNOSIS — F329 Major depressive disorder, single episode, unspecified: Secondary | ICD-10-CM | POA: Insufficient documentation

## 2019-07-23 DIAGNOSIS — Z87891 Personal history of nicotine dependence: Secondary | ICD-10-CM | POA: Insufficient documentation

## 2019-07-23 DIAGNOSIS — F419 Anxiety disorder, unspecified: Secondary | ICD-10-CM | POA: Insufficient documentation

## 2019-07-23 DIAGNOSIS — J449 Chronic obstructive pulmonary disease, unspecified: Secondary | ICD-10-CM | POA: Diagnosis not present

## 2019-07-23 DIAGNOSIS — H25812 Combined forms of age-related cataract, left eye: Secondary | ICD-10-CM | POA: Diagnosis not present

## 2019-07-23 DIAGNOSIS — Z79899 Other long term (current) drug therapy: Secondary | ICD-10-CM | POA: Diagnosis not present

## 2019-07-23 DIAGNOSIS — E7800 Pure hypercholesterolemia, unspecified: Secondary | ICD-10-CM | POA: Diagnosis not present

## 2019-07-23 DIAGNOSIS — M810 Age-related osteoporosis without current pathological fracture: Secondary | ICD-10-CM | POA: Diagnosis not present

## 2019-07-23 HISTORY — PX: CATARACT EXTRACTION W/PHACO: SHX586

## 2019-07-23 HISTORY — DX: Dyspnea, unspecified: R06.00

## 2019-07-23 HISTORY — DX: Myoneural disorder, unspecified: G70.9

## 2019-07-23 HISTORY — DX: Age-related osteoporosis without current pathological fracture: M81.0

## 2019-07-23 HISTORY — DX: Bronchitis, not specified as acute or chronic: J40

## 2019-07-23 HISTORY — DX: Anxiety disorder, unspecified: F41.9

## 2019-07-23 HISTORY — DX: Unspecified hearing loss, unspecified ear: H91.90

## 2019-07-23 SURGERY — PHACOEMULSIFICATION, CATARACT, WITH IOL INSERTION
Anesthesia: Monitor Anesthesia Care | Site: Eye | Laterality: Left

## 2019-07-23 MED ORDER — FENTANYL CITRATE (PF) 100 MCG/2ML IJ SOLN
INTRAMUSCULAR | Status: DC | PRN
  Administered 2019-07-23: 50 ug via INTRAVENOUS

## 2019-07-23 MED ORDER — NA HYALUR & NA CHOND-NA HYALUR 0.4-0.35 ML IO KIT
PACK | INTRAOCULAR | Status: DC | PRN
  Administered 2019-07-23: 1 mL via INTRAOCULAR

## 2019-07-23 MED ORDER — ONDANSETRON HCL 4 MG/2ML IJ SOLN: 4.0000 mg | Freq: Once | INTRAMUSCULAR | Status: DC | PRN

## 2019-07-23 MED ORDER — MIDAZOLAM HCL 2 MG/2ML IJ SOLN
INTRAMUSCULAR | Status: DC | PRN
  Administered 2019-07-23 (×2): 1 mg via INTRAVENOUS

## 2019-07-23 MED ORDER — EPINEPHRINE PF 1 MG/ML IJ SOLN
INTRAOCULAR | Status: DC | PRN
  Administered 2019-07-23: 60 mL via OPHTHALMIC

## 2019-07-23 MED ORDER — ARMC OPHTHALMIC DILATING DROPS
1.0000 "application " | OPHTHALMIC | Status: DC | PRN
  Administered 2019-07-23 (×3): 1 via OPHTHALMIC

## 2019-07-23 MED ORDER — CEFUROXIME OPHTHALMIC INJECTION 1 MG/0.1 ML
INJECTION | OPHTHALMIC | Status: DC | PRN
  Administered 2019-07-23: 0.1 mL via INTRACAMERAL

## 2019-07-23 MED ORDER — BRIMONIDINE TARTRATE-TIMOLOL 0.2-0.5 % OP SOLN
OPHTHALMIC | Status: DC | PRN
  Administered 2019-07-23: 1 [drp] via OPHTHALMIC

## 2019-07-23 MED ORDER — LACTATED RINGERS IV SOLN: INTRAVENOUS | Status: DC

## 2019-07-23 MED ORDER — MOXIFLOXACIN HCL 0.5 % OP SOLN
1.0000 [drp] | OPHTHALMIC | Status: DC | PRN
  Administered 2019-07-23 (×3): 1 [drp] via OPHTHALMIC

## 2019-07-23 MED ORDER — LIDOCAINE HCL (PF) 2 % IJ SOLN
INTRAOCULAR | Status: DC | PRN
  Administered 2019-07-23: 1 mL

## 2019-07-23 MED ORDER — TETRACAINE HCL 0.5 % OP SOLN
1.0000 [drp] | OPHTHALMIC | Status: DC | PRN
  Administered 2019-07-23 (×3): 1 [drp] via OPHTHALMIC

## 2019-07-23 SURGICAL SUPPLY — 17 items
CANNULA ANT/CHMB 27G (MISCELLANEOUS) ×1 IMPLANT
CANNULA ANT/CHMB 27GA (MISCELLANEOUS) ×2 IMPLANT
GLOVE SURG LX 7.5 STRW (GLOVE) ×1
GLOVE SURG LX STRL 7.5 STRW (GLOVE) ×1 IMPLANT
GLOVE SURG TRIUMPH 8.0 PF LTX (GLOVE) ×2 IMPLANT
GOWN STRL REUS W/ TWL LRG LVL3 (GOWN DISPOSABLE) ×2 IMPLANT
GOWN STRL REUS W/TWL LRG LVL3 (GOWN DISPOSABLE) ×4
LENS IOL IQ PAN TRC 30 18.0 IMPLANT
LENS IOL PANOPTIX TORIC 18.0 ×2 IMPLANT
MARKER SKIN DUAL TIP RULER LAB (MISCELLANEOUS) ×2 IMPLANT
PACK CATARACT BRASINGTON (MISCELLANEOUS) ×2 IMPLANT
PACK EYE AFTER SURG (MISCELLANEOUS) ×2 IMPLANT
PACK OPTHALMIC (MISCELLANEOUS) ×2 IMPLANT
SYR 3ML LL SCALE MARK (SYRINGE) ×2 IMPLANT
SYR TB 1ML LUER SLIP (SYRINGE) ×2 IMPLANT
WATER STERILE IRR 500ML POUR (IV SOLUTION) ×2 IMPLANT
WIPE NON LINTING 3.25X3.25 (MISCELLANEOUS) ×2 IMPLANT

## 2019-07-23 NOTE — Anesthesia Postprocedure Evaluation (Signed)
Anesthesia Post Note  Patient: Morgan [name]  Procedure(s) Performed: CATARACT EXTRACTION PHACO AND INTRAOCULAR LENS PLACEMENT (IOC) LEFT ponoptix lens 01:05.0  13.6%  8.87 (Left Eye)  Patient location during evaluation: PACU Anesthesia Type: MAC Level of consciousness: awake and alert, oriented and patient cooperative Pain management: pain level controlled Vital Signs Assessment: post-procedure vital signs reviewed and stable Respiratory status: spontaneous breathing, nonlabored ventilation and respiratory function stable Cardiovascular status: blood pressure returned to baseline and stable Postop Assessment: adequate PO intake Anesthetic complications: no    Darrin Nipper

## 2019-07-23 NOTE — Op Note (Signed)
LOCATION:  [facility]   PREOPERATIVE DIAGNOSIS:  Nuclear sclerotic cataract of the left eye.  H25.12  POSTOPERATIVE DIAGNOSIS:  Nuclear sclerotic cataract of the left eye.   PROCEDURE:  Phacoemulsification with Toric posterior chamber intraocular lens placement of the left eye.  Ultrasound time: Procedure(s): CATARACT EXTRACTION PHACO AND INTRAOCULAR LENS PLACEMENT (IOC) LEFT ponoptix lens 01:05.0  13.6%  8.87 (Left) LENS: [id number] Panoptix 18.0 D Toric intraocular lens with 1.5 diopters of cylindrical power with axis orientation at 164 degrees.   ULTRASOUND TIME: 14 % of 1 minutes, 5 seconds.  CDE 8.9   SURGEON:  Wyonia Hough, MD   ANESTHESIA:  Topical with tetracaine drops and 2% Xylocaine jelly, augmented with 1% preservative-free intracameral lidocaine.  COMPLICATIONS:  None.   DESCRIPTION OF PROCEDURE:  The patient was identified in the holding room and transported to the operating suite and placed in the supine position under the operating microscope.  The left eye was identified as the operative eye, and it was prepped and draped in the usual sterile ophthalmic fashion.    A clear-corneal paracentesis incision was made at the 1:30 position.  0.5 ml of preservative-free 1% lidocaine was injected into the anterior chamber. The anterior chamber was filled with Viscoat.  A 2.4 millimeter near clear corneal incision was then made at the 10:30 position.  A cystotome and capsulorrhexis forceps were then used to make a curvilinear capsulorrhexis.  Hydrodissection and hydrodelineation were then performed using balanced salt solution.   Phacoemulsification was then used in stop and chop fashion to remove the lens, nucleus and epinucleus.  The remaining cortex was aspirated using the irrigation and aspiration handpiece.  Provisc viscoelastic was then placed into the capsular bag to distend it for lens placement.  The Verion digital marker was used to align the implant at the  intended axis.   A 18.0 diopter lens was then injected into the capsular bag.  It was rotated clockwise until the axis marks on the lens were approximately 15 degrees in the counterclockwise direction to the intended alignment.  The viscoelastic was aspirated from the eye using the irrigation aspiration handpiece.  Then, a [name] spatula through the sideport incision was used to rotate the lens in a clockwise direction until the axis markings of the intraocular lens were lined up with the Verion alignment.  Balanced salt solution was then used to hydrate the wounds. Cefuroxime 0.1 ml of a 10mg /ml solution was injected into the anterior chamber for a dose of 1 mg of intracameral antibiotic at the completion of the case.    The eye was noted to have a physiologic pressure and there was no wound leak noted.   Timolol and Brimonidine drops were applied to the eye.  The patient was taken to the recovery room in stable condition having had no complications of anesthesia or surgery.  Thuan Tippett 07/23/2019, 10:15 AM

## 2019-07-23 NOTE — Anesthesia Procedure Notes (Signed)
Procedure Name: MAC Date/Time: 07/23/2019 9:51 AM Performed by: Cameron Ali, CRNA Pre-anesthesia Checklist: Patient identified, Emergency Drugs available, Suction available, Timeout performed and Patient being monitored Patient Re-evaluated:Patient Re-evaluated prior to induction Oxygen Delivery Method: Nasal cannula Placement Confirmation: positive ETCO2

## 2019-07-23 NOTE — Transfer of Care (Signed)
Immediate Anesthesia Transfer of Care Note  Patient: Morgan [name]  Procedure(s) Performed: CATARACT EXTRACTION PHACO AND INTRAOCULAR LENS PLACEMENT (IOC) LEFT ponoptix lens 01:05.0  13.6%  8.87 (Left Eye)  Patient Location: PACU  Anesthesia Type: MAC  Level of Consciousness: awake, alert  and patient cooperative  Airway and Oxygen Therapy: Patient Spontanous Breathing and Patient connected to supplemental oxygen  Post-op Assessment: Post-op Vital signs reviewed, Patient's Cardiovascular Status Stable, Respiratory Function Stable, Patent Airway and No signs of Nausea or vomiting  Post-op Vital Signs: Reviewed and stable  Complications: No apparent anesthesia complications

## 2019-07-23 NOTE — H&P (Signed)

## 2019-07-24 ENCOUNTER — Encounter: Payer: Self-pay | Admitting: Ophthalmology

## 2019-07-31 ENCOUNTER — Other Ambulatory Visit: Payer: Self-pay | Admitting: Pharmacist

## 2019-07-31 NOTE — Patient Outreach (Signed)
[place] [facility]) Care Management [place]  07/31/2019  [place] 02-May-1946 [id number]   [facility] pharmacy has mailed out patient assistance program application(s) for [facility] and Proventil to patient and confirmed that application(s) received.   Patient has not yet returned the application(s) to [facility].  [facility] pharmacy will therefore close case at this time until patient able to return application(s).  If / when application(s) received at [facility], pharmacy can re-pen case and assist with submitting application(s) for processing.   Ralene Bathe, PharmD, [place] [phone]

## 2019-08-01 DIAGNOSIS — I10 Essential (primary) hypertension: Secondary | ICD-10-CM | POA: Diagnosis not present

## 2019-08-01 DIAGNOSIS — H2511 Age-related nuclear cataract, right eye: Secondary | ICD-10-CM | POA: Diagnosis not present

## 2019-08-05 ENCOUNTER — Other Ambulatory Visit: Payer: Self-pay

## 2019-08-05 ENCOUNTER — Encounter: Payer: Self-pay | Admitting: *Deleted

## 2019-08-08 ENCOUNTER — Other Ambulatory Visit
Admission: RE | Admit: 2019-08-08 | Discharge: 2019-08-08 | Disposition: A | Discharge status: Home / Self Care | Payer: PPO | Source: Ambulatory Visit | Attending: Ophthalmology | Admitting: Ophthalmology

## 2019-08-08 ENCOUNTER — Other Ambulatory Visit: Payer: Self-pay

## 2019-08-08 DIAGNOSIS — Z20828 Contact with and (suspected) exposure to other viral communicable diseases: Secondary | ICD-10-CM | POA: Insufficient documentation

## 2019-08-08 DIAGNOSIS — Z01812 Encounter for preprocedural laboratory examination: Secondary | ICD-10-CM | POA: Diagnosis not present

## 2019-08-08 LAB — SARS CORONAVIRUS 2 (TAT 6-24 HRS): SARS Coronavirus 2: NEGATIVE

## 2019-08-10 NOTE — Anesthesia Preprocedure Evaluation (Addendum)
Anesthesia Evaluation  Patient identified by MRN, date of birth, ID band Patient awake    Reviewed: Allergy & Precautions, NPO status , Patient's Chart, lab work & pertinent test results  History of Anesthesia Complications Negative for: history of anesthetic complications  Airway Mallampati: III   Neck ROM: Full    Dental  (+)    Pulmonary COPD, former smoker,    Pulmonary exam normal breath sounds clear to auscultation       Cardiovascular hypertension, Normal cardiovascular exam Rhythm:Regular Rate:Normal     Neuro/Psych PSYCHIATRIC DISORDERS Depression HOH    GI/Hepatic GERD  ,  Endo/Other  negative endocrine ROS  Renal/GU negative Renal ROS     Musculoskeletal   Abdominal   Peds  Hematology negative hematology ROS (+)   Anesthesia Other Findings   Reproductive/Obstetrics                             Anesthesia Physical  Anesthesia Plan  ASA: II  Anesthesia Plan: MAC   Post-op Pain Management:    Induction: Intravenous  PONV Risk Score and Plan: 2 and TIVA, Midazolam and Treatment may vary due to age or medical condition  Airway Management Planned: Natural Airway  Additional Equipment:   Intra-op Plan:   Post-operative Plan:   Informed Consent: I have reviewed the patients History and Physical, chart, labs and discussed the procedure including the risks, benefits and alternatives for the proposed anesthesia with the patient or authorized representative who has indicated his/her understanding and acceptance.       Plan Discussed with: CRNA  Anesthesia Plan Comments:       Anesthesia Quick Evaluation

## 2019-08-11 ENCOUNTER — Telehealth: Payer: Self-pay | Admitting: Primary Care

## 2019-08-11 DIAGNOSIS — I10 Essential (primary) hypertension: Secondary | ICD-10-CM

## 2019-08-11 MED ORDER — HYDRALAZINE HCL 50 MG PO TABS: ORAL_TABLET | ORAL | 2 refills | Status: DC

## 2019-08-11 NOTE — Telephone Encounter (Signed)
Patient called and said all of her medications were transferred to [facility] except Hydralazine 50 mg tablet 1/2 in the morning and full tablet at night.  Patient needs a new rx sent to [facility].  Patient said she has enough medication until next week.

## 2019-08-11 NOTE — Discharge Instructions (Signed)

## 2019-08-11 NOTE — Telephone Encounter (Signed)
Patient has been notified

## 2019-08-11 NOTE — Telephone Encounter (Signed)
Refill sent as requested. 

## 2019-08-12 ENCOUNTER — Other Ambulatory Visit: Payer: Self-pay | Admitting: Primary Care

## 2019-08-12 DIAGNOSIS — I10 Essential (primary) hypertension: Secondary | ICD-10-CM

## 2019-08-12 MED ORDER — METOPROLOL SUCCINATE ER 25 MG PO TB24: 25.0000 mg | ORAL_TABLET | Freq: Every day | ORAL | 1 refills | Status: DC

## 2019-08-13 ENCOUNTER — Ambulatory Visit: Payer: PPO | Admitting: Anesthesiology

## 2019-08-13 ENCOUNTER — Ambulatory Visit
Admission: RE | Admit: 2019-08-13 | Discharge: 2019-08-13 | Disposition: A | Discharge status: Home / Self Care | Payer: PPO | Attending: Ophthalmology | Admitting: Ophthalmology

## 2019-08-13 ENCOUNTER — Other Ambulatory Visit: Payer: Self-pay

## 2019-08-13 ENCOUNTER — Encounter
Admission: RE | Disposition: A | Discharge status: Home / Self Care | Payer: Self-pay | Source: Home / Self Care | Attending: Ophthalmology

## 2019-08-13 DIAGNOSIS — F419 Anxiety disorder, unspecified: Secondary | ICD-10-CM | POA: Diagnosis not present

## 2019-08-13 DIAGNOSIS — Z885 Allergy status to narcotic agent status: Secondary | ICD-10-CM | POA: Diagnosis not present

## 2019-08-13 DIAGNOSIS — Z974 Presence of external hearing-aid: Secondary | ICD-10-CM | POA: Diagnosis not present

## 2019-08-13 DIAGNOSIS — H2511 Age-related nuclear cataract, right eye: Secondary | ICD-10-CM | POA: Diagnosis not present

## 2019-08-13 DIAGNOSIS — F329 Major depressive disorder, single episode, unspecified: Secondary | ICD-10-CM | POA: Diagnosis not present

## 2019-08-13 DIAGNOSIS — I10 Essential (primary) hypertension: Secondary | ICD-10-CM | POA: Insufficient documentation

## 2019-08-13 DIAGNOSIS — Z87891 Personal history of nicotine dependence: Secondary | ICD-10-CM | POA: Insufficient documentation

## 2019-08-13 DIAGNOSIS — J449 Chronic obstructive pulmonary disease, unspecified: Secondary | ICD-10-CM | POA: Insufficient documentation

## 2019-08-13 DIAGNOSIS — M1990 Unspecified osteoarthritis, unspecified site: Secondary | ICD-10-CM | POA: Diagnosis not present

## 2019-08-13 DIAGNOSIS — Z888 Allergy status to other drugs, medicaments and biological substances status: Secondary | ICD-10-CM | POA: Insufficient documentation

## 2019-08-13 DIAGNOSIS — E7800 Pure hypercholesterolemia, unspecified: Secondary | ICD-10-CM | POA: Insufficient documentation

## 2019-08-13 DIAGNOSIS — Z79899 Other long term (current) drug therapy: Secondary | ICD-10-CM | POA: Diagnosis not present

## 2019-08-13 DIAGNOSIS — M810 Age-related osteoporosis without current pathological fracture: Secondary | ICD-10-CM | POA: Diagnosis not present

## 2019-08-13 DIAGNOSIS — K219 Gastro-esophageal reflux disease without esophagitis: Secondary | ICD-10-CM | POA: Insufficient documentation

## 2019-08-13 DIAGNOSIS — Z881 Allergy status to other antibiotic agents status: Secondary | ICD-10-CM | POA: Insufficient documentation

## 2019-08-13 DIAGNOSIS — H25811 Combined forms of age-related cataract, right eye: Secondary | ICD-10-CM | POA: Diagnosis not present

## 2019-08-13 HISTORY — PX: CATARACT EXTRACTION W/PHACO: SHX586

## 2019-08-13 SURGERY — PHACOEMULSIFICATION, CATARACT, WITH IOL INSERTION
Anesthesia: Monitor Anesthesia Care | Laterality: Right

## 2019-08-13 MED ORDER — LIDOCAINE HCL (PF) 2 % IJ SOLN
INTRAOCULAR | Status: DC | PRN
  Administered 2019-08-13: 1 mL

## 2019-08-13 MED ORDER — EPINEPHRINE PF 1 MG/ML IJ SOLN
INTRAOCULAR | Status: DC | PRN
  Administered 2019-08-13: 49 mL via OPHTHALMIC

## 2019-08-13 MED ORDER — ACETAMINOPHEN 160 MG/5ML PO SOLN: 325.0000 mg | ORAL | Status: DC | PRN

## 2019-08-13 MED ORDER — ACETAMINOPHEN 325 MG PO TABS: 650.0000 mg | ORAL_TABLET | Freq: Once | ORAL | Status: DC | PRN

## 2019-08-13 MED ORDER — FENTANYL CITRATE (PF) 100 MCG/2ML IJ SOLN
INTRAMUSCULAR | Status: DC | PRN
  Administered 2019-08-13: 50 ug via INTRAVENOUS

## 2019-08-13 MED ORDER — MOXIFLOXACIN HCL 0.5 % OP SOLN
1.0000 [drp] | OPHTHALMIC | Status: DC | PRN
  Administered 2019-08-13 (×3): 1 [drp] via OPHTHALMIC

## 2019-08-13 MED ORDER — TETRACAINE HCL 0.5 % OP SOLN
1.0000 [drp] | OPHTHALMIC | Status: DC | PRN
  Administered 2019-08-13 (×3): 1 [drp] via OPHTHALMIC

## 2019-08-13 MED ORDER — ARMC OPHTHALMIC DILATING DROPS
1.0000 "application " | OPHTHALMIC | Status: DC | PRN
  Administered 2019-08-13 (×3): 1 via OPHTHALMIC

## 2019-08-13 MED ORDER — ONDANSETRON HCL 4 MG/2ML IJ SOLN: 4.0000 mg | Freq: Once | INTRAMUSCULAR | Status: DC | PRN

## 2019-08-13 MED ORDER — MIDAZOLAM HCL 2 MG/2ML IJ SOLN
INTRAMUSCULAR | Status: DC | PRN
  Administered 2019-08-13: 1 mg via INTRAVENOUS

## 2019-08-13 MED ORDER — BRIMONIDINE TARTRATE-TIMOLOL 0.2-0.5 % OP SOLN
OPHTHALMIC | Status: DC | PRN
  Administered 2019-08-13: 1 [drp] via OPHTHALMIC

## 2019-08-13 MED ORDER — CEFUROXIME OPHTHALMIC INJECTION 1 MG/0.1 ML
INJECTION | OPHTHALMIC | Status: DC | PRN
  Administered 2019-08-13: 0.1 mL via INTRACAMERAL

## 2019-08-13 MED ORDER — NA HYALUR & NA CHOND-NA HYALUR 0.4-0.35 ML IO KIT
PACK | INTRAOCULAR | Status: DC | PRN
  Administered 2019-08-13: 1 mL via INTRAOCULAR

## 2019-08-13 SURGICAL SUPPLY — 17 items
CANNULA ANT/CHMB 27G (MISCELLANEOUS) ×1 IMPLANT
CANNULA ANT/CHMB 27GA (MISCELLANEOUS) ×2 IMPLANT
GLOVE SURG LX 7.5 STRW (GLOVE) ×1
GLOVE SURG LX STRL 7.5 STRW (GLOVE) ×1 IMPLANT
GLOVE SURG TRIUMPH 8.0 PF LTX (GLOVE) ×2 IMPLANT
GOWN STRL REUS W/ TWL LRG LVL3 (GOWN DISPOSABLE) ×2 IMPLANT
GOWN STRL REUS W/TWL LRG LVL3 (GOWN DISPOSABLE) ×4
LENS ACRYSOF TORIC TRIFOC 7.5 ×2 IMPLANT
LENS IOL ACRSF IQ PAN 7.5 IMPLANT
MARKER SKIN DUAL TIP RULER LAB (MISCELLANEOUS) ×2 IMPLANT
PACK CATARACT BRASINGTON (MISCELLANEOUS) ×2 IMPLANT
PACK EYE AFTER SURG (MISCELLANEOUS) ×2 IMPLANT
PACK OPTHALMIC (MISCELLANEOUS) ×2 IMPLANT
SYR 3ML LL SCALE MARK (SYRINGE) ×2 IMPLANT
SYR TB 1ML LUER SLIP (SYRINGE) ×2 IMPLANT
WATER STERILE IRR 500ML POUR (IV SOLUTION) ×2 IMPLANT
WIPE NON LINTING 3.25X3.25 (MISCELLANEOUS) ×2 IMPLANT

## 2019-08-13 NOTE — Op Note (Signed)
LOCATION:  [place]   PREOPERATIVE DIAGNOSIS:    Nuclear sclerotic cataract right eye. H25.11   POSTOPERATIVE DIAGNOSIS:  Nuclear sclerotic cataract right eye.     PROCEDURE:  Phacoemusification with posterior chamber intraocular lens placement of the right eye   ULTRASOUND TIME: Procedure(s): CATARACT EXTRACTION PHACO AND INTRAOCULAR LENS PLACEMENT (IOC) RIGHT PANOPTIX LENS 00:53.0  21.0%  11.25 (Right)  LENS:   Implant Name Type Inv. Item Serial No. Manufacturer Lot No. LRB No. Used Action  AcrySof IQ PanOptix IOL Intraocular Lens  [id number]   Right 1 Implanted    [id number] 17.5 D     SURGEON:  Wyonia Hough, MD   ANESTHESIA:  Topical with tetracaine drops and 2% Xylocaine jelly, augmented with 1% preservative-free intracameral lidocaine.    COMPLICATIONS:  None.   DESCRIPTION OF PROCEDURE:  The patient was identified in the holding room and transported to the operating room and placed in the supine position under the operating microscope.  The right eye was identified as the operative eye and it was prepped and draped in the usual sterile ophthalmic fashion.   A 1 millimeter clear-corneal paracentesis was made at the 12:00 position.  0.5 ml of preservative-free 1% lidocaine was injected into the anterior chamber. The anterior chamber was filled with Viscoat viscoelastic.  A 2.4 millimeter keratome was used to make a near-clear corneal incision at the 9:00 position.  A curvilinear capsulorrhexis was made with a cystotome and capsulorrhexis forceps.  Balanced salt solution was used to hydrodissect and hydrodelineate the nucleus.   Phacoemulsification was then used in stop and chop fashion to remove the lens nucleus and epinucleus.  The remaining cortex was then removed using the irrigation and aspiration handpiece. Provisc was then placed into the capsular bag to distend it for lens placement.  A lens was then injected into the capsular bag.  The remaining  viscoelastic was aspirated.   Wounds were hydrated with balanced salt solution.  The anterior chamber was inflated to a physiologic pressure with balanced salt solution.  No wound leaks were noted. Cefuroxime 0.1 ml of a 10mg /ml solution was injected into the anterior chamber for a dose of 1 mg of intracameral antibiotic at the completion of the case.   Timolol and Brimonidine drops were applied to the eye.  The patient was taken to the recovery room in stable condition without complications of anesthesia or surgery.   Jackilyn Umphlett 08/13/2019, 12:40 PM

## 2019-08-13 NOTE — H&P (Signed)

## 2019-08-13 NOTE — Transfer of Care (Signed)
Immediate Anesthesia Transfer of Care Note  Patient: Morgan [name]  Procedure(s) Performed: CATARACT EXTRACTION PHACO AND INTRAOCULAR LENS PLACEMENT (IOC) RIGHT PANOPTIX LENS (Right )  Patient Location: PACU  Anesthesia Type: MAC  Level of Consciousness: awake, alert  and patient cooperative  Airway and Oxygen Therapy: Patient Spontanous Breathing and Patient connected to supplemental oxygen  Post-op Assessment: Post-op Vital signs reviewed, Patient's Cardiovascular Status Stable, Respiratory Function Stable, Patent Airway and No signs of Nausea or vomiting  Post-op Vital Signs: Reviewed and stable  Complications: No apparent anesthesia complications

## 2019-08-13 NOTE — Anesthesia Procedure Notes (Signed)
Procedure Name: MAC Date/Time: 08/13/2019 12:23 PM Performed by: Cameron Ali, CRNA Pre-anesthesia Checklist: Patient identified, Emergency Drugs available, Suction available, Timeout performed and Patient being monitored Patient Re-evaluated:Patient Re-evaluated prior to induction Oxygen Delivery Method: Nasal cannula Placement Confirmation: positive ETCO2

## 2019-08-13 NOTE — Anesthesia Postprocedure Evaluation (Signed)
Anesthesia Post Note  Patient: Morgan [name]  Procedure(s) Performed: CATARACT EXTRACTION PHACO AND INTRAOCULAR LENS PLACEMENT (IOC) RIGHT PANOPTIX LENS 00:53.0  21.0%  11.25 (Right )     Patient location during evaluation: PACU Anesthesia Type: MAC Level of consciousness: awake and alert, oriented and patient cooperative Pain management: pain level controlled Vital Signs Assessment: post-procedure vital signs reviewed and stable Respiratory status: spontaneous breathing, nonlabored ventilation and respiratory function stable Cardiovascular status: blood pressure returned to baseline and stable Postop Assessment: adequate PO intake Anesthetic complications: no    Darrin Nipper

## 2019-08-14 ENCOUNTER — Encounter: Payer: Self-pay | Admitting: Ophthalmology

## 2019-08-26 ENCOUNTER — Encounter: Payer: Self-pay | Admitting: Ophthalmology

## 2019-08-26 NOTE — OR Nursing (Signed)
Entered chart to correct implant model number.

## 2019-08-27 ENCOUNTER — Telehealth: Payer: Self-pay

## 2019-08-27 DIAGNOSIS — K219 Gastro-esophageal reflux disease without esophagitis: Secondary | ICD-10-CM

## 2019-08-27 MED ORDER — OMEPRAZOLE 20 MG PO CPDR: 20.0000 mg | DELAYED_RELEASE_CAPSULE | Freq: Every day | ORAL | 1 refills | Status: DC

## 2019-08-27 NOTE — Telephone Encounter (Signed)
Pt left v/m requesting cb about refill. Unable to reach pt by phone; phone rang several times no answer and no v/m. FYI to [place].

## 2019-08-27 NOTE — Telephone Encounter (Signed)
Spoken to patient and asked which Rx does she need refill. Patient stated that she need omeprazole refill.   Refill as requested.

## 2019-09-03 ENCOUNTER — Telehealth: Payer: Self-pay | Admitting: *Deleted

## 2019-09-03 NOTE — Telephone Encounter (Signed)
Left a voicemail for patient to notify her that it is time to schedule annual low dose lung cancer screening CT scan. Instructed patient to call back to verify information prior to the scan being scheduled. 

## 2019-09-12 DIAGNOSIS — M79601 Pain in right arm: Secondary | ICD-10-CM | POA: Diagnosis not present

## 2019-09-17 ENCOUNTER — Telehealth: Payer: Self-pay | Admitting: *Deleted

## 2019-09-17 DIAGNOSIS — Z122 Encounter for screening for malignant neoplasm of respiratory organs: Secondary | ICD-10-CM

## 2019-09-17 DIAGNOSIS — Z87891 Personal history of nicotine dependence: Secondary | ICD-10-CM

## 2019-09-17 NOTE — Telephone Encounter (Signed)
Patient has been notified that annual lung cancer screening low dose CT scan is due currently or will be in near future. Confirmed that patient is within the age range of 55-77, and asymptomatic, (no signs or symptoms of lung cancer). Patient denies illness that would prevent curative treatment for lung cancer if found. Verified smoking history, (former, quit 09/19/18, 56 pack year). The shared decision making visit was done 09/10/18. Patient is agreeable for CT scan being scheduled.

## 2019-09-19 ENCOUNTER — Ambulatory Visit
Admission: RE | Admit: 2019-09-19 | Discharge: 2019-09-19 | Disposition: A | Discharge status: Home / Self Care | Payer: PPO | Source: Ambulatory Visit | Attending: Nurse Practitioner | Admitting: Nurse Practitioner

## 2019-09-19 ENCOUNTER — Other Ambulatory Visit: Payer: Self-pay

## 2019-09-19 DIAGNOSIS — Z87891 Personal history of nicotine dependence: Secondary | ICD-10-CM | POA: Insufficient documentation

## 2019-09-19 DIAGNOSIS — Z122 Encounter for screening for malignant neoplasm of respiratory organs: Secondary | ICD-10-CM | POA: Diagnosis not present

## 2019-09-23 ENCOUNTER — Encounter: Payer: Self-pay | Admitting: *Deleted

## 2019-09-23 DIAGNOSIS — R5383 Other fatigue: Secondary | ICD-10-CM | POA: Diagnosis not present

## 2019-09-23 DIAGNOSIS — E559 Vitamin D deficiency, unspecified: Secondary | ICD-10-CM | POA: Diagnosis not present

## 2019-09-23 DIAGNOSIS — M810 Age-related osteoporosis without current pathological fracture: Secondary | ICD-10-CM | POA: Diagnosis not present

## 2019-09-30 ENCOUNTER — Other Ambulatory Visit: Payer: Self-pay | Admitting: Sports Medicine

## 2019-09-30 DIAGNOSIS — E559 Vitamin D deficiency, unspecified: Secondary | ICD-10-CM | POA: Diagnosis not present

## 2019-09-30 DIAGNOSIS — M810 Age-related osteoporosis without current pathological fracture: Secondary | ICD-10-CM | POA: Diagnosis not present

## 2019-10-07 DIAGNOSIS — G5601 Carpal tunnel syndrome, right upper limb: Secondary | ICD-10-CM | POA: Diagnosis not present

## 2019-10-07 DIAGNOSIS — M79601 Pain in right arm: Secondary | ICD-10-CM | POA: Diagnosis not present

## 2019-10-07 DIAGNOSIS — G5621 Lesion of ulnar nerve, right upper limb: Secondary | ICD-10-CM | POA: Diagnosis not present

## 2019-10-10 HISTORY — PX: HAND SURGERY: SHX662

## 2019-10-15 DIAGNOSIS — G5601 Carpal tunnel syndrome, right upper limb: Secondary | ICD-10-CM | POA: Diagnosis not present

## 2019-10-15 DIAGNOSIS — G5621 Lesion of ulnar nerve, right upper limb: Secondary | ICD-10-CM | POA: Diagnosis not present

## 2019-10-16 ENCOUNTER — Telehealth: Payer: Self-pay | Admitting: Pulmonary Disease

## 2019-10-16 ENCOUNTER — Other Ambulatory Visit: Payer: Self-pay

## 2019-10-16 DIAGNOSIS — I700 Atherosclerosis of aorta: Secondary | ICD-10-CM

## 2019-10-16 NOTE — Telephone Encounter (Signed)
Patient wanted to reschedule her appt to a date that she could see Dr. Vaughan Browner and also have PFT prior.  Pt states she cannot keep her 1/20 appt d/t an upcoming surgery, and that she is breathing much better since she quit smoking.  No available appts in Jan or Feb with both [place] and PFT, and no schedule is laid for [facility] in March at this time.    Routing to [place]- please advise when Dr. Matilde Bash schedule is laid for March, when we can reschedule patient for pft with rov after.  1/20 appt has been cancelled at pt request.

## 2019-10-16 NOTE — Telephone Encounter (Signed)
There is only 1 open PFT between now and pt's scheduled OV, and there is not enough time between now and then for pt to get covid tested.   lmtcb X1 for pt.  We can keep her appt as scheduled at this time.

## 2019-10-16 NOTE — Telephone Encounter (Signed)
[phone] pt calling back

## 2019-10-21 DIAGNOSIS — G5601 Carpal tunnel syndrome, right upper limb: Secondary | ICD-10-CM | POA: Diagnosis not present

## 2019-10-21 DIAGNOSIS — G5621 Lesion of ulnar nerve, right upper limb: Secondary | ICD-10-CM | POA: Diagnosis not present

## 2019-10-22 NOTE — Telephone Encounter (Signed)
Dr. Vaughan Browner still has no schedule for March at this time

## 2019-10-23 NOTE — Telephone Encounter (Signed)
I have placed a recall in the patient's chart to contact pt in Feb when March schedule is available -pr

## 2019-10-26 ENCOUNTER — Other Ambulatory Visit: Payer: Self-pay | Admitting: Primary Care

## 2019-10-26 DIAGNOSIS — J441 Chronic obstructive pulmonary disease with (acute) exacerbation: Secondary | ICD-10-CM

## 2019-10-26 DIAGNOSIS — J069 Acute upper respiratory infection, unspecified: Secondary | ICD-10-CM

## 2019-10-29 ENCOUNTER — Ambulatory Visit: Payer: PPO | Admitting: Pulmonary Disease

## 2019-10-30 ENCOUNTER — Ambulatory Visit
Admission: RE | Admit: 2019-10-30 | Discharge: 2019-10-30 | Disposition: A | Discharge status: Home / Self Care | Payer: PPO | Source: Ambulatory Visit | Attending: Vascular Surgery | Admitting: Vascular Surgery

## 2019-10-30 DIAGNOSIS — I700 Atherosclerosis of aorta: Secondary | ICD-10-CM

## 2019-10-30 DIAGNOSIS — I7101 Dissection of thoracic aorta: Secondary | ICD-10-CM | POA: Diagnosis not present

## 2019-10-30 MED ORDER — IOPAMIDOL (ISOVUE-370) INJECTION 76%
75.0000 mL | Freq: Once | INTRAVENOUS | Status: AC | PRN
  Administered 2019-10-30: 75 mL via INTRAVENOUS

## 2019-11-04 ENCOUNTER — Other Ambulatory Visit: Payer: Self-pay

## 2019-11-04 ENCOUNTER — Ambulatory Visit: Payer: PPO | Admitting: Vascular Surgery

## 2019-11-04 ENCOUNTER — Encounter: Payer: Self-pay | Admitting: Vascular Surgery

## 2019-11-04 VITALS — BP 143/76 | HR 78 | Temp 97.3°F | Resp 16 | Ht 61.0 in | Wt 140.0 lb

## 2019-11-04 DIAGNOSIS — I700 Atherosclerosis of aorta: Secondary | ICD-10-CM
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
## 2019-11-28 ENCOUNTER — Other Ambulatory Visit: Payer: Self-pay | Admitting: Primary Care

## 2019-11-28 DIAGNOSIS — F32A Depression, unspecified: Secondary | ICD-10-CM

## 2019-11-28 DIAGNOSIS — F329 Major depressive disorder, single episode, unspecified: Secondary | ICD-10-CM

## 2019-11-28 DIAGNOSIS — I10 Essential (primary) hypertension: Secondary | ICD-10-CM

## 2019-11-28 MED ORDER — VENLAFAXINE HCL ER 75 MG PO CP24: 75.0000 mg | ORAL_CAPSULE | Freq: Every day | ORAL | 1 refills | Status: DC

## 2019-11-28 MED ORDER — IRBESARTAN 300 MG PO TABS: 300.0000 mg | ORAL_TABLET | Freq: Every day | ORAL | 1 refills | Status: DC

## 2019-12-11 ENCOUNTER — Telehealth: Payer: Self-pay | Admitting: Pulmonary Disease

## 2019-12-11 NOTE — Telephone Encounter (Signed)
Instructions from last OV 05/21/19  I am glad you are doing well with your breathing Continue with the inhalers as prescribed We will get labs today including CBC with differential, IgE, alpha-1 antitrypsin levels and phenotype We will schedule you for pulmonary function test Follow-up in 1 to 2 months.     Called and spoke with pt and scheduled her for PFT as well as the covid test prior. Nothing further needed.

## 2019-12-24 ENCOUNTER — Ambulatory Visit
Admission: RE | Admit: 2019-12-24 | Discharge: 2019-12-24 | Disposition: A | Discharge status: Home / Self Care | Payer: PPO | Source: Ambulatory Visit | Attending: Sports Medicine | Admitting: Sports Medicine

## 2019-12-24 ENCOUNTER — Other Ambulatory Visit: Payer: Self-pay

## 2019-12-24 DIAGNOSIS — M810 Age-related osteoporosis without current pathological fracture: Secondary | ICD-10-CM

## 2019-12-24 DIAGNOSIS — Z780 Asymptomatic menopausal state: Secondary | ICD-10-CM | POA: Diagnosis not present

## 2020-01-13 ENCOUNTER — Other Ambulatory Visit: Payer: Self-pay

## 2020-01-13 DIAGNOSIS — I10 Essential (primary) hypertension: Secondary | ICD-10-CM

## 2020-01-13 DIAGNOSIS — E785 Hyperlipidemia, unspecified: Secondary | ICD-10-CM

## 2020-01-13 MED ORDER — SIMVASTATIN 40 MG PO TABS: 40.0000 mg | ORAL_TABLET | Freq: Every evening | ORAL | 0 refills | Status: DC

## 2020-01-13 MED ORDER — METOPROLOL SUCCINATE ER 25 MG PO TB24: 25.0000 mg | ORAL_TABLET | Freq: Every day | ORAL | 0 refills | Status: DC

## 2020-01-13 NOTE — Telephone Encounter (Signed)
Noted. Refill sent.

## 2020-01-13 NOTE — Telephone Encounter (Signed)
Pt left v/m requesting status of refill request for simvastatin from [place]; [place].

## 2020-01-16 DIAGNOSIS — M47812 Spondylosis without myelopathy or radiculopathy, cervical region: Secondary | ICD-10-CM | POA: Diagnosis not present

## 2020-01-16 DIAGNOSIS — G5621 Lesion of ulnar nerve, right upper limb: Secondary | ICD-10-CM | POA: Diagnosis not present

## 2020-01-22 ENCOUNTER — Other Ambulatory Visit: Payer: Self-pay | Admitting: Primary Care

## 2020-01-22 DIAGNOSIS — K219 Gastro-esophageal reflux disease without esophagitis: Secondary | ICD-10-CM

## 2020-01-22 MED ORDER — OMEPRAZOLE 20 MG PO CPDR: 20.0000 mg | DELAYED_RELEASE_CAPSULE | Freq: Every day | ORAL | 1 refills | Status: DC

## 2020-01-30 DIAGNOSIS — G5603 Carpal tunnel syndrome, bilateral upper limbs: Secondary | ICD-10-CM | POA: Diagnosis not present

## 2020-01-30 DIAGNOSIS — G5623 Lesion of ulnar nerve, bilateral upper limbs: Secondary | ICD-10-CM | POA: Diagnosis not present

## 2020-01-30 DIAGNOSIS — G5621 Lesion of ulnar nerve, right upper limb: Secondary | ICD-10-CM | POA: Diagnosis not present

## 2020-02-21 ENCOUNTER — Other Ambulatory Visit (HOSPITAL_COMMUNITY)
Admission: RE | Admit: 2020-02-21 | Discharge: 2020-02-21 | Disposition: A | Discharge status: Home / Self Care | Payer: PPO | Source: Ambulatory Visit | Attending: Pulmonary Disease | Admitting: Pulmonary Disease

## 2020-02-21 DIAGNOSIS — Z01812 Encounter for preprocedural laboratory examination: Secondary | ICD-10-CM | POA: Insufficient documentation

## 2020-02-21 DIAGNOSIS — Z20822 Contact with and (suspected) exposure to covid-19: Secondary | ICD-10-CM | POA: Insufficient documentation

## 2020-02-21 LAB — SARS CORONAVIRUS 2 (TAT 6-24 HRS): SARS Coronavirus 2: NEGATIVE

## 2020-02-24 ENCOUNTER — Other Ambulatory Visit: Payer: Self-pay

## 2020-02-24 ENCOUNTER — Ambulatory Visit (INDEPENDENT_AMBULATORY_CARE_PROVIDER_SITE_OTHER): Payer: PPO | Admitting: Pulmonary Disease

## 2020-02-24 DIAGNOSIS — J449 Chronic obstructive pulmonary disease, unspecified: Secondary | ICD-10-CM

## 2020-02-24 LAB — PULMONARY FUNCTION TEST
DL/VA % pred: 73 %
DL/VA: 3.12 ml/min/mmHg/L
DLCO cor % pred: 75 %
DLCO cor: 13.11 ml/min/mmHg
DLCO unc % pred: 75 %
DLCO unc: 13.11 ml/min/mmHg
FEF 25-75 Post: 0.97 L/sec
FEF 25-75 Pre: 0.9 L/sec
FEF2575-%Change-Post: 8 %
FEF2575-%Pred-Post: 59 %
FEF2575-%Pred-Pre: 54 %
FEV1-%Change-Post: 2 %
FEV1-%Pred-Post: 83 %
FEV1-%Pred-Pre: 81 %
FEV1-Post: 1.61 L
FEV1-Pre: 1.57 L
FEV1FVC-%Change-Post: 1 %
FEV1FVC-%Pred-Pre: 88 %
FEV6-%Change-Post: 1 %
FEV6-%Pred-Post: 96 %
FEV6-%Pred-Pre: 95 %
FEV6-Post: 2.37 L
FEV6-Pre: 2.33 L
FEV6FVC-%Change-Post: 0 %
FEV6FVC-%Pred-Post: 104 %
FEV6FVC-%Pred-Pre: 103 %
FVC-%Change-Post: 0 %
FVC-%Pred-Post: 92 %
FVC-%Pred-Pre: 91 %
FVC-Post: 2.38 L
FVC-Pre: 2.36 L
Post FEV1/FVC ratio: 68 %
Post FEV6/FVC ratio: 99 %
Pre FEV1/FVC ratio: 67 %
Pre FEV6/FVC Ratio: 99 %
RV % pred: 120 %
RV: 2.51 L
TLC % pred: 108 %
TLC: 4.97 L

## 2020-02-24 NOTE — Progress Notes (Signed)
PFT done today. 

## 2020-03-19 ENCOUNTER — Other Ambulatory Visit: Payer: Self-pay | Admitting: Primary Care

## 2020-03-19 DIAGNOSIS — E785 Hyperlipidemia, unspecified: Secondary | ICD-10-CM

## 2020-03-19 DIAGNOSIS — I10 Essential (primary) hypertension: Secondary | ICD-10-CM

## 2020-03-19 MED ORDER — METOPROLOL SUCCINATE ER 25 MG PO TB24: 25.0000 mg | ORAL_TABLET | Freq: Every day | ORAL | 0 refills | Status: DC

## 2020-03-19 MED ORDER — SIMVASTATIN 40 MG PO TABS: 40.0000 mg | ORAL_TABLET | Freq: Every evening | ORAL | 0 refills | Status: DC

## 2020-03-24 DIAGNOSIS — E559 Vitamin D deficiency, unspecified: Secondary | ICD-10-CM | POA: Diagnosis not present

## 2020-03-24 DIAGNOSIS — R5383 Other fatigue: Secondary | ICD-10-CM | POA: Diagnosis not present

## 2020-03-24 DIAGNOSIS — M810 Age-related osteoporosis without current pathological fracture: Secondary | ICD-10-CM | POA: Diagnosis not present

## 2020-03-30 ENCOUNTER — Encounter: Payer: Self-pay | Admitting: Primary Care

## 2020-03-30 ENCOUNTER — Ambulatory Visit (INDEPENDENT_AMBULATORY_CARE_PROVIDER_SITE_OTHER): Payer: PPO | Admitting: Primary Care

## 2020-03-30 ENCOUNTER — Other Ambulatory Visit: Payer: Self-pay

## 2020-03-30 VITALS — BP 124/80 | HR 72 | Temp 96.4°F | Ht 61.0 in | Wt 145.2 lb

## 2020-03-30 DIAGNOSIS — F32A Depression, unspecified: Secondary | ICD-10-CM

## 2020-03-30 DIAGNOSIS — M545 Low back pain: Secondary | ICD-10-CM | POA: Diagnosis not present

## 2020-03-30 DIAGNOSIS — R7303 Prediabetes: Secondary | ICD-10-CM

## 2020-03-30 DIAGNOSIS — E785 Hyperlipidemia, unspecified: Secondary | ICD-10-CM | POA: Diagnosis not present

## 2020-03-30 DIAGNOSIS — K5900 Constipation, unspecified: Secondary | ICD-10-CM | POA: Diagnosis not present

## 2020-03-30 DIAGNOSIS — K219 Gastro-esophageal reflux disease without esophagitis: Secondary | ICD-10-CM | POA: Diagnosis not present

## 2020-03-30 DIAGNOSIS — I10 Essential (primary) hypertension: Secondary | ICD-10-CM | POA: Diagnosis not present

## 2020-03-30 DIAGNOSIS — M810 Age-related osteoporosis without current pathological fracture: Secondary | ICD-10-CM

## 2020-03-30 DIAGNOSIS — Z0000 Encounter for general adult medical examination without abnormal findings: Secondary | ICD-10-CM

## 2020-03-30 DIAGNOSIS — F419 Anxiety disorder, unspecified: Secondary | ICD-10-CM

## 2020-03-30 DIAGNOSIS — Z23 Encounter for immunization: Secondary | ICD-10-CM | POA: Diagnosis not present

## 2020-03-30 DIAGNOSIS — G8929 Other chronic pain: Secondary | ICD-10-CM

## 2020-03-30 DIAGNOSIS — Z1159 Encounter for screening for other viral diseases: Secondary | ICD-10-CM | POA: Diagnosis not present

## 2020-03-30 DIAGNOSIS — I700 Atherosclerosis of aorta: Secondary | ICD-10-CM | POA: Diagnosis not present

## 2020-03-30 DIAGNOSIS — Z0001 Encounter for general adult medical examination with abnormal findings: Secondary | ICD-10-CM | POA: Insufficient documentation

## 2020-03-30 DIAGNOSIS — F329 Major depressive disorder, single episode, unspecified: Secondary | ICD-10-CM

## 2020-03-30 DIAGNOSIS — J449 Chronic obstructive pulmonary disease, unspecified: Secondary | ICD-10-CM | POA: Diagnosis not present

## 2020-03-30 DIAGNOSIS — Z1211 Encounter for screening for malignant neoplasm of colon: Secondary | ICD-10-CM

## 2020-03-30 LAB — COMPREHENSIVE METABOLIC PANEL
ALT: 22 U/L (ref 0–35)
AST: 29 U/L (ref 0–37)
Albumin: 4.3 g/dL (ref 3.5–5.2)
Alkaline Phosphatase: 80 U/L (ref 39–117)
BUN: 14 mg/dL (ref 6–23)
CO2: 32 mEq/L (ref 19–32)
Calcium: 9.6 mg/dL (ref 8.4–10.5)
Chloride: 99 mEq/L (ref 96–112)
Creatinine, Ser: 0.76 mg/dL (ref 0.40–1.20)
GFR: 74.7 mL/min (ref >60.00)
Glucose, Bld: 93 mg/dL (ref 70–99)
Potassium: 4.8 mEq/L (ref 3.5–5.1)
Sodium: 136 mEq/L (ref 135–145)
Total Bilirubin: 0.5 mg/dL (ref 0.2–1.2)
Total Protein: 6.2 g/dL (ref 6.0–8.3)

## 2020-03-30 LAB — CBC
HCT: 32.4 % — ABNORMAL LOW (ref 36.0–46.0)
Hemoglobin: 11 g/dL — ABNORMAL LOW (ref 12.0–15.0)
MCHC: 34.1 g/dL (ref 30.0–36.0)
MCV: 95.9 fl (ref 78.0–100.0)
Platelets: 366 10*3/uL (ref 150.0–400.0)
RBC: 3.37 Mil/uL — ABNORMAL LOW (ref 3.87–5.11)
RDW: 14.1 % (ref 11.5–15.5)
WBC: 7.2 10*3/uL (ref 4.0–10.5)

## 2020-03-30 LAB — LIPID PANEL
Cholesterol: 157 mg/dL (ref 0–200)
HDL: 77.3 mg/dL (ref >39.00)
LDL Cholesterol: 67 mg/dL (ref 0–99)
NonHDL: 79.82
Total CHOL/HDL Ratio: 2
Triglycerides: 65 mg/dL (ref 0.0–149.0)
VLDL: 13 mg/dL (ref 0.0–40.0)

## 2020-03-30 LAB — HEMOGLOBIN A1C: Hgb A1c MFr Bld: 5.7 % (ref 4.6–6.5)

## 2020-03-30 MED ORDER — ZOSTER VAC RECOMB ADJUVANTED 50 MCG/0.5ML IM SUSR: 0.5000 mL | Freq: Once | INTRAMUSCULAR | 1 refills | Status: AC

## 2020-03-30 NOTE — Assessment & Plan Note (Signed)
Doing well on venlafaxine daily, continue same.
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
## 2020-03-30 NOTE — Assessment & Plan Note (Signed)
Chronic, stable. No concerns today.  

## 2020-03-30 NOTE — Assessment & Plan Note (Signed)
Compliant to simvastatin, repeat lipid panel pending. Continues to refrain from smoking.

## 2020-03-30 NOTE — Assessment & Plan Note (Signed)
Repeat A1C pending. 

## 2020-03-30 NOTE — Assessment & Plan Note (Signed)
Bone density scan UTD, compliant to Prolia injections. Following with orthopedics.

## 2020-03-30 NOTE — Addendum Note (Signed)
Addended by: Jacqualin Combes on: 03/30/2020 10:55 AM   Modules accepted: Orders

## 2020-03-30 NOTE — Patient Instructions (Addendum)
Stop by the lab prior to leaving today. I will notify you of your results once received.   Take the shingles vaccine to the pharmacy after one month.  Complete the Cologuard test once received.  It's important to improve your diet by reducing consumption of fast food, fried food, processed snack foods, sugary drinks. Increase consumption of fresh vegetables and fruits, whole grains, water.  Ensure you are drinking 64 ounces of water daily.  Start exercising. You should be getting 150 minutes of exercise weekly.  It was a pleasure to see you today!    Preventive Care 73 Years and Older, Female Preventive care refers to lifestyle choices and visits with your health care provider that can promote health and wellness. This includes:  A yearly physical exam. This is also called an annual well check.  Regular dental and eye exams.  Immunizations.  Screening for certain conditions.  Healthy lifestyle choices, such as diet and exercise. What can I expect for my preventive care visit? Physical exam Your health care provider will check:  Height and weight. These may be used to calculate body mass index (BMI), which is a measurement that tells if you are at a healthy weight.  Heart rate and blood pressure.  Your skin for abnormal spots. Counseling Your health care provider may ask you questions about:  Alcohol, tobacco, and drug use.  Emotional well-being.  Home and relationship well-being.  Sexual activity.  Eating habits.  History of falls.  Memory and ability to understand (cognition).  Work and work Statistician.  Pregnancy and menstrual history. What immunizations do I need?  Influenza (flu) vaccine  This is recommended every year. Tetanus, diphtheria, and pertussis (Tdap) vaccine  You may need a Td booster every 10 years. Varicella (chickenpox) vaccine  You may need this vaccine if you have not already been vaccinated. Zoster (shingles) vaccine  You may  need this after age 73. Pneumococcal conjugate (PCV13) vaccine  One dose is recommended after age 73. Pneumococcal polysaccharide (PPSV23) vaccine  One dose is recommended after age 73. Measles, mumps, and rubella (MMR) vaccine  You may need at least one dose of MMR if you were born in 1957 or later. You may also need a second dose. Meningococcal conjugate (MenACWY) vaccine  You may need this if you have certain conditions. Hepatitis A vaccine  You may need this if you have certain conditions or if you travel or work in places where you may be exposed to hepatitis A. Hepatitis B vaccine  You may need this if you have certain conditions or if you travel or work in places where you may be exposed to hepatitis B. Haemophilus influenzae type b (Hib) vaccine  You may need this if you have certain conditions. You may receive vaccines as individual doses or as more than one vaccine together in one shot (combination vaccines). Talk with your health care provider about the risks and benefits of combination vaccines. What tests do I need? Blood tests  Lipid and cholesterol levels. These may be checked every 5 years, or more frequently depending on your overall health.  Hepatitis C test.  Hepatitis B test. Screening  Lung cancer screening. You may have this screening every year starting at age 73 if you have a 30-pack-year history of smoking and currently smoke or have quit within the past 15 years.  Colorectal cancer screening. All adults should have this screening starting at age 73 and continuing until age 26. Your health care provider may recommend  screening at age 92 if you are at increased risk. You will have tests every 1-10 years, depending on your results and the type of screening test.  Diabetes screening. This is done by checking your blood sugar (glucose) after you have not eaten for a while (fasting). You may have this done every 1-3 years.  Mammogram. This may be done every  1-2 years. Talk with your health care provider about how often you should have regular mammograms.  BRCA-related cancer screening. This may be done if you have a family history of breast, ovarian, tubal, or peritoneal cancers. Other tests  Sexually transmitted disease (STD) testing.  Bone density scan. This is done to screen for osteoporosis. You may have this done starting at age 73. Follow these instructions at home: Eating and drinking  Eat a diet that includes fresh fruits and vegetables, whole grains, lean protein, and low-fat dairy products. Limit your intake of foods with high amounts of sugar, saturated fats, and salt.  Take vitamin and mineral supplements as recommended by your health care provider.  Do not drink alcohol if your health care provider tells you not to drink.  If you drink alcohol: ? Limit how much you have to 0-1 drink a day. ? Be aware of how much alcohol is in your drink. In the [place] one drink equals one 12 oz bottle of beer (355 mL), one 5 oz glass of wine (148 mL), or one 1 oz glass of hard liquor (44 mL). Lifestyle  Take daily care of your teeth and gums.  Stay active. Exercise for at least 30 minutes on 5 or more days each week.  Do not use any products that contain nicotine or tobacco, such as cigarettes, e-cigarettes, and chewing tobacco. If you need help quitting, ask your health care provider.  If you are sexually active, practice safe sex. Use a condom or other form of protection in order to prevent STIs (sexually transmitted infections).  Talk with your health care provider about taking a low-dose aspirin or statin. What's next?  Go to your health care provider once a year for a well check visit.  Ask your health care provider how often you should have your eyes and teeth checked.  Stay up to date on all vaccines. This information is not intended to replace advice given to you by your health care provider. Make sure you discuss any questions  you have with your health care provider. Document Revised: 09/19/2018 Document Reviewed: 09/19/2018 Elsevier Patient Education  2020 [employer].

## 2020-03-30 NOTE — Assessment & Plan Note (Signed)
Well controlled in the office today, continue hydralazine, irbesartan, metoprolol succinate. CMP pending.

## 2020-03-30 NOTE — Assessment & Plan Note (Signed)
Chronic, intermittent. Stable recently. Continue to monitor.

## 2020-03-30 NOTE — Assessment & Plan Note (Signed)
Compliant to simvastatin, repeat lipids pending.

## 2020-03-30 NOTE — Assessment & Plan Note (Signed)
Pneumovax due, provided today. Shingrix due, Rx printed. Mammogram UTD, due in Fall 2020. Colon cancer screening due, she opts for cologuard again. Orders placed. Bone density scan UTD.  Discussed the importance of a healthy diet and regular exercise in order for weight loss, and to reduce the risk of any potential medical problems.  Exam today stable. Labs pending.

## 2020-03-30 NOTE — Assessment & Plan Note (Signed)
Ran out of Advair, using albuterol daily due to allergy season. Following with pulmonology, has an appointment next week.  Offered to refill Advair, but she kindly declines as she would like to see pulmonology first.

## 2020-03-30 NOTE — Assessment & Plan Note (Signed)
Overall doing well on omeprazole 20 mg, continue same.

## 2020-03-31 DIAGNOSIS — E559 Vitamin D deficiency, unspecified: Secondary | ICD-10-CM | POA: Diagnosis not present

## 2020-03-31 DIAGNOSIS — M810 Age-related osteoporosis without current pathological fracture: Secondary | ICD-10-CM | POA: Diagnosis not present

## 2020-03-31 LAB — HEPATITIS C ANTIBODY
Hepatitis C Ab: NONREACTIVE
SIGNAL TO CUT-OFF: 0.01 (ref <1.00)

## 2020-04-06 ENCOUNTER — Ambulatory Visit: Payer: PPO | Admitting: Pulmonary Disease

## 2020-04-07 ENCOUNTER — Other Ambulatory Visit: Payer: Self-pay

## 2020-04-07 ENCOUNTER — Encounter: Payer: Self-pay | Admitting: Pulmonary Disease

## 2020-04-07 ENCOUNTER — Ambulatory Visit: Payer: PPO | Admitting: Pulmonary Disease

## 2020-04-07 VITALS — BP 132/70 | HR 92 | Temp 98.5°F | Ht 60.0 in | Wt 143.4 lb

## 2020-04-07 DIAGNOSIS — J449 Chronic obstructive pulmonary disease, unspecified: Secondary | ICD-10-CM | POA: Diagnosis not present

## 2020-04-07 MED ORDER — TRELEGY ELLIPTA 100-62.5-25 MCG/INH IN AEPB: 1.0000 | INHALATION_SPRAY | Freq: Every day | RESPIRATORY_TRACT | 5 refills | Status: DC

## 2020-04-07 MED ORDER — TRELEGY ELLIPTA 100-62.5-25 MCG/INH IN AEPB: 1.0000 | INHALATION_SPRAY | Freq: Every day | RESPIRATORY_TRACT | 0 refills | Status: DC

## 2020-04-07 NOTE — Patient Instructions (Signed)
We will resume your inhaler therapy We will send in a prescription for Trelegy instead of Advair Continue the albuterol as needed  Follow-up in 6 months.

## 2020-04-07 NOTE — Progress Notes (Signed)
Morgan [name] [facility]    [id number]    [birth year]  Primary Care Physician:Clark, [name], NP  Referring Physician: [name], NP [place],  [place] [id number]  Chief complaint:  Follow-up for COPD  HPI: 74 year old with history of COPD, GERD, hypertension, allergies, ex-smoker admitted in July 2020 for COPD exacerbation which is treated with steroids and nebulizer.  Started on Advair as an outpatient after discharge and referred to pulmonary for further evaluation  Pets: No pets Occupation: Retired Network engineer.  Used to work in [employer] when she was young Exposures: No known exposures, no mold, hot tub, Jacuzzi Smoking history: 50-75-pack-year smoker.  Quit in December 2019.  Took Vaping after quitting cigarettes and quit vaping in July 2020 Travel history: No significant travel history Relevant family history: No significant family history of lung disease  Interim history: Ran out of her Advair inhaler 1 month ago.  She has noticed increasing dyspnea and is using her rescue inhaler 4 times a day.  Outpatient Encounter Medications as of 04/07/2020  Medication Sig  . acetaminophen (TYLENOL) 500 MG tablet Take 500 mg by mouth every 6 (six) hours as needed.  Marland Kitchen albuterol (VENTOLIN HFA) 108 (90 Base) MCG/ACT inhaler INHALE 2 PUFFS INTO THE LUNGS EVERY 4 (FOUR) HOURS AS NEEDED FOR WHEEZING OR SHORTNESS OF BREATH.  Marland Kitchen Cholecalciferol (D-3-5) 5000 UNITS capsule Take 5,000 Units by mouth daily.  Marland Kitchen denosumab (PROLIA) 60 MG/ML SOSY injection Inject 60 mg into the skin every 6 (six) months. Last administration June 2020  . Fluticasone-Salmeterol (ADVAIR DISKUS) 250-50 MCG/DOSE AEPB Inhale 1 puff into the lungs 2 (two) times daily.  . hydrALAZINE (APRESOLINE) 50 MG tablet TAKE 1/2 TABLET EVERY MORNING AND 1 FULL TABLET IN THE EVENING FOR BLOOD PRESSURE  . ibuprofen (ADVIL,MOTRIN) 200 MG tablet Take 400-600 mg by mouth every 8 (eight) hours as needed for mild pain  or moderate pain.  Marland Kitchen irbesartan (AVAPRO) 300 MG tablet Take 1 tablet (300 mg total) by mouth daily. For blood pressure.  . metoprolol succinate (TOPROL-XL) 25 MG 24 hr tablet Take 1 tablet (25 mg total) by mouth daily. For blood pressure.  . Multiple Vitamins-Minerals (MULTIVITAMIN GUMMIES ADULTS) CHEW Chew 2 tablets by mouth daily.   Marland Kitchen omeprazole (PRILOSEC) 20 MG capsule Take 1 capsule (20 mg total) by mouth daily. For heartburn.  . polyethylene glycol powder (GLYCOLAX/MIRALAX) powder Mix 17 g into water once to twice daily as needed for constipation.  . simvastatin (ZOCOR) 40 MG tablet Take 1 tablet (40 mg total) by mouth every evening. For cholesterol. NEED FOLLOW UP APPOINTMENT WITH RECHECK LABS  . venlafaxine XR (EFFEXOR-XR) 75 MG 24 hr capsule Take 1 capsule (75 mg total) by mouth daily with breakfast. For anxiety/depression.  . vitamin C (ASCORBIC ACID) 500 MG tablet Take 500 mg by mouth daily.   No facility-administered encounter medications on file as of 04/07/2020.   Physical Exam: Blood pressure 132/70, pulse 92, temperature 98.5 F (36.9 C), temperature source Oral, height 5' (1.524 m), weight 143 lb 6.4 oz (65 kg), SpO2 95 %. Gen:      No acute distress HEENT:  EOMI, sclera anicteric Neck:     No masses; no thyromegaly Lungs:    Clear to auscultation bilaterally; normal respiratory effort CV:         Regular rate and rhythm; no murmurs Abd:      + bowel sounds; soft, non-tender; no palpable masses,  no distension Ext:    No edema; adequate peripheral perfusion Skin:      Warm and dry; no rash Neuro: alert and oriented x 3 Psych: normal mood and affect  Data Reviewed: Imaging: Low-dose screening CT 09/10/2018- emphysema, subcentimeter pulmonary nodules measuring less than 3 mm.  Aortic atherosclerosis, coronary artery calcification.  Low-dose screening CT 09/19/2019-emphysema, bronchial wall thickening.  Stable pulmonary nodules. I have reviewed the images  personally.  PFTs: 02/24/2020 FVC 2.38 [92%], FEV1 1.61 [83%], F/F 68, TLC 4.97 [108%] DLCO 13.11 [25%] Mild obstruction and diffusion impairment.  Labs: CBC 05/05/2019-WBC 9.3, eos 13%, absolute eosinophil count 1209 CBC 05/21/2019-WBC 5.3, eos 5.1%, absolute eosinophil count 270 IgE 05/21/2019-126 Alpha-1 antitrypsin 06/21/2019-135, PI MM  Assessment:  COPD PFTs reviewed with moderate obstruction. Suspect she may have a component of asthma as well given elevated peripheral eosinophilia Ran out of Advair 1 month ago.  Will resume inhaler therapy with Trelegy which will likely work out better for her  Ex-smoker Continue screening CT chest  Health maintenance 10/11/2016-Prevnar  Plan/Recommendations: - Change Advair to Trelegy  Marshell Garfinkel MD [facility] 04/07/2020, 11:42 AM  CC: [name], NP

## 2020-04-08 ENCOUNTER — Telehealth: Payer: Self-pay | Admitting: Pulmonary Disease

## 2020-04-08 LAB — COLOGUARD

## 2020-04-08 MED ORDER — TRELEGY ELLIPTA 100-62.5-25 MCG/INH IN AEPB: 1.0000 | INHALATION_SPRAY | Freq: Every day | RESPIRATORY_TRACT | 2 refills | Status: DC

## 2020-04-08 NOTE — Telephone Encounter (Signed)
New Trelegy prescription sent to [facility] requesting 3 inhalers, with refills. Nothing further at this time.

## 2020-04-09 ENCOUNTER — Other Ambulatory Visit: Payer: Self-pay | Admitting: Primary Care

## 2020-04-09 DIAGNOSIS — F32A Depression, unspecified: Secondary | ICD-10-CM

## 2020-04-09 DIAGNOSIS — I10 Essential (primary) hypertension: Secondary | ICD-10-CM

## 2020-04-09 MED ORDER — IRBESARTAN 300 MG PO TABS: 300.0000 mg | ORAL_TABLET | Freq: Every day | ORAL | 1 refills | Status: DC

## 2020-04-09 MED ORDER — VENLAFAXINE HCL ER 75 MG PO CP24: 75.0000 mg | ORAL_CAPSULE | Freq: Every day | ORAL | 1 refills | Status: DC
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
## 2020-04-16 LAB — COLOGUARD

## 2020-05-03 DIAGNOSIS — Z1212 Encounter for screening for malignant neoplasm of rectum: Secondary | ICD-10-CM | POA: Diagnosis not present

## 2020-05-03 DIAGNOSIS — Z1211 Encounter for screening for malignant neoplasm of colon: Secondary | ICD-10-CM | POA: Diagnosis not present

## 2020-05-03 LAB — COLOGUARD: Cologuard: NEGATIVE

## 2020-05-07 LAB — COLOGUARD: COLOGUARD: NEGATIVE

## 2020-05-11 ENCOUNTER — Encounter: Payer: Self-pay | Admitting: Primary Care

## 2020-06-03 ENCOUNTER — Other Ambulatory Visit: Payer: Self-pay | Admitting: Primary Care

## 2020-06-03 DIAGNOSIS — M47816 Spondylosis without myelopathy or radiculopathy, lumbar region: Secondary | ICD-10-CM | POA: Diagnosis not present

## 2020-06-03 DIAGNOSIS — K219 Gastro-esophageal reflux disease without esophagitis: Secondary | ICD-10-CM

## 2020-06-04 DIAGNOSIS — M47816 Spondylosis without myelopathy or radiculopathy, lumbar region: Secondary | ICD-10-CM | POA: Diagnosis not present

## 2020-06-10 DIAGNOSIS — M41125 Adolescent idiopathic scoliosis, thoracolumbar region: Secondary | ICD-10-CM | POA: Diagnosis not present

## 2020-06-10 DIAGNOSIS — M5415 Radiculopathy, thoracolumbar region: Secondary | ICD-10-CM | POA: Diagnosis not present

## 2020-06-10 DIAGNOSIS — M5116 Intervertebral disc disorders with radiculopathy, lumbar region: Secondary | ICD-10-CM | POA: Diagnosis not present

## 2020-07-07 DIAGNOSIS — Z1231 Encounter for screening mammogram for malignant neoplasm of breast: Secondary | ICD-10-CM | POA: Diagnosis not present

## 2020-07-07 LAB — HM MAMMOGRAPHY

## 2020-07-11 ENCOUNTER — Encounter: Payer: Self-pay | Admitting: Primary Care

## 2020-08-05 ENCOUNTER — Other Ambulatory Visit: Payer: Self-pay

## 2020-08-05 DIAGNOSIS — E785 Hyperlipidemia, unspecified: Secondary | ICD-10-CM

## 2020-08-05 DIAGNOSIS — I10 Essential (primary) hypertension: Secondary | ICD-10-CM

## 2020-08-05 MED ORDER — SIMVASTATIN 40 MG PO TABS: 40.0000 mg | ORAL_TABLET | Freq: Every evening | ORAL | 1 refills | Status: DC

## 2020-08-05 MED ORDER — HYDRALAZINE HCL 50 MG PO TABS: ORAL_TABLET | ORAL | 2 refills | Status: DC

## 2020-09-09 ENCOUNTER — Other Ambulatory Visit: Payer: Self-pay | Admitting: Primary Care

## 2020-09-13 ENCOUNTER — Other Ambulatory Visit: Payer: PPO

## 2020-09-17 ENCOUNTER — Other Ambulatory Visit: Payer: Self-pay | Admitting: *Deleted

## 2020-09-17 DIAGNOSIS — Z122 Encounter for screening for malignant neoplasm of respiratory organs: Secondary | ICD-10-CM

## 2020-09-17 DIAGNOSIS — Z87891 Personal history of nicotine dependence: Secondary | ICD-10-CM

## 2020-09-17 NOTE — Progress Notes (Signed)
Contacted and scheduled for lung screening scan. Patient is a former smoker with quit date 09/19/18, 56 pack year history.

## 2020-09-20 ENCOUNTER — Ambulatory Visit: Payer: PPO | Admitting: Primary Care

## 2020-09-21 ENCOUNTER — Ambulatory Visit (INDEPENDENT_AMBULATORY_CARE_PROVIDER_SITE_OTHER): Payer: PPO

## 2020-09-21 ENCOUNTER — Other Ambulatory Visit: Payer: Self-pay

## 2020-09-21 DIAGNOSIS — Z0000 Encounter for general adult medical examination without abnormal findings: Secondary | ICD-10-CM | POA: Diagnosis not present
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
## 2020-09-21 NOTE — Patient Instructions (Signed)
Morgan [name] , Thank you for taking time to come for your Medicare Wellness Visit. I appreciate your ongoing commitment to your health goals. Please review the following plan we discussed and let me know if I can assist you in the future.   Screening recommendations/referrals: Colonoscopy: Cologuard completed 05/03/2020, due 04/2023 Mammogram: Up to date, completed 07/07/2020, due 06/2021 Bone Density: Up to date, completed 12/24/2019, due 12/2021 Recommended yearly ophthalmology/optometry visit for glaucoma screening and checkup Recommended yearly dental visit for hygiene and checkup  Vaccinations: Influenza vaccine: Up to date, completed 08/09/2020, due 05/2021 Pneumococcal vaccine: Completed series Tdap vaccine: decline-insurance Shingles vaccine: due, check with your insurance regarding coverage/costif interested    Covid-19:completed 2 dose series, declined booster   Advanced directives: Advance directive discussed with you today. Even though you declined this today please call our office should you change your mind and we can give you the proper paperwork for you to fill out.  Conditions/risks identified: hypertension, hyperlipidemia  Next appointment: Follow up in one year for your annual wellness visit    Preventive Care 23 Years and Older, Female Preventive care refers to lifestyle choices and visits with your health care provider that can promote health and wellness. What does preventive care include?  A yearly physical exam. This is also called an annual well check.  Dental exams once or twice a year.  Routine eye exams. Ask your health care provider how often you should have your eyes checked.  Personal lifestyle choices, including:  Daily care of your teeth and gums.  Regular physical activity.  Eating a healthy diet.  Avoiding tobacco and drug use.  Limiting alcohol use.  Practicing safe sex.  Taking low-dose aspirin every day.  Taking vitamin and mineral  supplements as recommended by your health care provider. What happens during an annual well check? The services and screenings done by your health care provider during your annual well check will depend on your age, overall health, lifestyle risk factors, and family history of disease. Counseling  Your health care provider may ask you questions about your:  Alcohol use.  Tobacco use.  Drug use.  Emotional well-being.  Home and relationship well-being.  Sexual activity.  Eating habits.  History of falls.  Memory and ability to understand (cognition).  Work and work Statistician.  Reproductive health. Screening  You may have the following tests or measurements:  Height, weight, and BMI.  Blood pressure.  Lipid and cholesterol levels. These may be checked every 5 years, or more frequently if you are over 53 years old.  Skin check.  Lung cancer screening. You may have this screening every year starting at age 82 if you have a 30-pack-year history of smoking and currently smoke or have quit within the past 15 years.  Fecal occult blood test (FOBT) of the stool. You may have this test every year starting at age 60.  Flexible sigmoidoscopy or colonoscopy. You may have a sigmoidoscopy every 5 years or a colonoscopy every 10 years starting at age 41.  Hepatitis C blood test.  Hepatitis B blood test.  Sexually transmitted disease (STD) testing.  Diabetes screening. This is done by checking your blood sugar (glucose) after you have not eaten for a while (fasting). You may have this done every 1-3 years.  Bone density scan. This is done to screen for osteoporosis. You may have this done starting at age 104.  Mammogram. This may be done every 1-2 years. Talk to your health care provider about  how often you should have regular mammograms. Talk with your health care provider about your test results, treatment options, and if necessary, the need for more tests. Vaccines  Your  health care provider may recommend certain vaccines, such as:  Influenza vaccine. This is recommended every year.  Tetanus, diphtheria, and acellular pertussis (Tdap, Td) vaccine. You may need a Td booster every 10 years.  Zoster vaccine. You may need this after age 34.  Pneumococcal 13-valent conjugate (PCV13) vaccine. One dose is recommended after age 20.  Pneumococcal polysaccharide (PPSV23) vaccine. One dose is recommended after age 32. Talk to your health care provider about which screenings and vaccines you need and how often you need them. This information is not intended to replace advice given to you by your health care provider. Make sure you discuss any questions you have with your health care provider. Document Released: 10/22/2015 Document Revised: 06/14/2016 Document Reviewed: 07/27/2015 Elsevier Interactive Patient Education  2017 [place] Prevention in the Home Falls can cause injuries. They can happen to people of all ages. There are many things you can do to make your home safe and to help prevent falls. What can I do on the outside of my home?  Regularly fix the edges of walkways and driveways and fix any cracks.  Remove anything that might make you trip as you walk through a door, such as a raised step or threshold.  Trim any bushes or trees on the path to your home.  Use bright outdoor lighting.  Clear any walking paths of anything that might make someone trip, such as rocks or tools.  Regularly check to see if handrails are loose or broken. Make sure that both sides of any steps have handrails.  Any raised decks and porches should have guardrails on the edges.  Have any leaves, snow, or ice cleared regularly.  Use sand or salt on walking paths during winter.  Clean up any spills in your garage right away. This includes oil or grease spills. What can I do in the bathroom?  Use night lights.  Install grab bars by the toilet and in the tub and  shower. Do not use towel bars as grab bars.  Use non-skid mats or decals in the tub or shower.  If you need to sit down in the shower, use a plastic, non-slip stool.  Keep the floor dry. Clean up any water that spills on the floor as soon as it happens.  Remove soap buildup in the tub or shower regularly.  Attach bath mats securely with double-sided non-slip rug tape.  Do not have throw rugs and other things on the floor that can make you trip. What can I do in the bedroom?  Use night lights.  Make sure that you have a light by your bed that is easy to reach.  Do not use any sheets or blankets that are too big for your bed. They should not hang down onto the floor.  Have a firm chair that has side arms. You can use this for support while you get dressed.  Do not have throw rugs and other things on the floor that can make you trip. What can I do in the kitchen?  Clean up any spills right away.  Avoid walking on wet floors.  Keep items that you use a lot in easy-to-reach places.  If you need to reach something above you, use a strong step stool that has a grab bar.  Keep  electrical cords out of the way.  Do not use floor polish or wax that makes floors slippery. If you must use wax, use non-skid floor wax.  Do not have throw rugs and other things on the floor that can make you trip. What can I do with my stairs?  Do not leave any items on the stairs.  Make sure that there are handrails on both sides of the stairs and use them. Fix handrails that are broken or loose. Make sure that handrails are as long as the stairways.  Check any carpeting to make sure that it is firmly attached to the stairs. Fix any carpet that is loose or worn.  Avoid having throw rugs at the top or bottom of the stairs. If you do have throw rugs, attach them to the floor with carpet tape.  Make sure that you have a light switch at the top of the stairs and the bottom of the stairs. If you do not  have them, ask someone to add them for you. What else can I do to help prevent falls?  Wear shoes that:  Do not have high heels.  Have rubber bottoms.  Are comfortable and fit you well.  Are closed at the toe. Do not wear sandals.  If you use a stepladder:  Make sure that it is fully opened. Do not climb a closed stepladder.  Make sure that both sides of the stepladder are locked into place.  Ask someone to hold it for you, if possible.  Clearly mark and make sure that you can see:  Any grab bars or handrails.  First and last steps.  Where the edge of each step is.  Use tools that help you move around (mobility aids) if they are needed. These include:  Canes.  Walkers.  Scooters.  Crutches.  Turn on the lights when you go into a dark area. Replace any light bulbs as soon as they burn out.  Set up your furniture so you have a clear path. Avoid moving your furniture around.  If any of your floors are uneven, fix them.  If there are any pets around you, be aware of where they are.  Review your medicines with your doctor. Some medicines can make you feel dizzy. This can increase your chance of falling. Ask your doctor what other things that you can do to help prevent falls. This information is not intended to replace advice given to you by your health care provider. Make sure you discuss any questions you have with your health care provider. Document Released: 07/22/2009 Document Revised: 03/02/2016 Document Reviewed: 10/30/2014 Elsevier Interactive Patient Education  2017 [employer].

## 2020-09-21 NOTE — Progress Notes (Signed)
PCP notes:  Health Maintenance: TDap- insurance Covid booster declined   Abnormal Screenings: none   Patient concerns: Constipation issues    Nurse concerns: none   Next PCP appt.: 09/23/2020 @ 10:20 am

## 2020-09-23 ENCOUNTER — Ambulatory Visit (INDEPENDENT_AMBULATORY_CARE_PROVIDER_SITE_OTHER): Payer: PPO | Admitting: Primary Care

## 2020-09-23 ENCOUNTER — Other Ambulatory Visit: Payer: Self-pay

## 2020-09-23 DIAGNOSIS — F32A Depression, unspecified: Secondary | ICD-10-CM | POA: Diagnosis not present

## 2020-09-23 DIAGNOSIS — F419 Anxiety disorder, unspecified: Secondary | ICD-10-CM

## 2020-09-23 DIAGNOSIS — G479 Sleep disorder, unspecified: Secondary | ICD-10-CM

## 2020-09-23 DIAGNOSIS — K5900 Constipation, unspecified: Secondary | ICD-10-CM

## 2020-09-23 NOTE — Assessment & Plan Note (Addendum)
Acute for the last month, doesn't seem to be secondary to anxiety.   Differentials include sleep apnea, caffeine consumption, little activity during the day.  Epworth sleepiness score of 1 today.  Will have her reduce caffeine intake, increase physical activity, trial of melatonin.   Also discussed good sleep hygiene. She will update.   >30 minutes spent discussing conditions.

## 2020-09-23 NOTE — Assessment & Plan Note (Signed)
Well controlled on venlafaxine ER 75 mg, continue same.

## 2020-09-23 NOTE — Patient Instructions (Signed)
Eliminate caffeine after 12 pm every day. Limit liquids one hour prior to bedtime.  If you cannot fall asleep within 30 minutes, get out of bed and read, return to bed 30 minutes later. Continue this cycle until you fall asleep.  You can try Melatonin 5 mg for sleep. Take 1-2 hours prior to bedtime.  Ensure you are consuming 64 ounces of water daily.  Increase dietary fiber intake.   You must increase activity level. Exercise 30 minutes everyday.   Please update me if no improvement in sleep or constipation.   It was a pleasure to see you today!   Constipation, Adult Constipation is when a person:  Poops (has a bowel movement) fewer times in a week than normal.  Has a hard time pooping.  Has poop that is dry, hard, or bigger than normal. Follow these instructions at home: Eating and drinking   Eat foods that have a lot of fiber, such as: ? Fresh fruits and vegetables. ? Whole grains. ? Beans.  Eat less of foods that are high in fat, low in fiber, or overly processed, such as: ? [place] fries. ? Hamburgers. ? Cookies. ? Candy. ? Soda.  Drink enough fluid to keep your pee (urine) clear or pale yellow. General instructions  Exercise regularly or as told by your doctor.  Go to the restroom when you feel like you need to poop. Do not hold it in.  Take over-the-counter and prescription medicines only as told by your doctor. These include any fiber supplements.  Do pelvic floor retraining exercises, such as: ? Doing deep breathing while relaxing your lower belly (abdomen). ? Relaxing your pelvic floor while pooping.  Watch your condition for any changes.  Keep all follow-up visits as told by your doctor. This is important. Contact a doctor if:  You have pain that gets worse.  You have a fever.  You have not pooped for 4 days.  You throw up (vomit).  You are not hungry.  You lose weight.  You are bleeding from the anus.  You have thin, pencil-like poop  (stool). Get help right away if:  You have a fever, and your symptoms suddenly get worse.  You leak poop or have blood in your poop.  Your belly feels hard or bigger than normal (is bloated).  You have very bad belly pain.  You feel dizzy or you faint. This information is not intended to replace advice given to you by your health care provider. Make sure you discuss any questions you have with your health care provider. Document Revised: 09/07/2017 Document Reviewed: 03/15/2016 Elsevier Patient Education  2020 [employer].
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
## 2020-09-23 NOTE — Assessment & Plan Note (Signed)
Chronic, little water intake, little physical activity, some fiber intake.  Discussed to increase water intake, exercise daily, increase dietary fiber. Handout provided.   Discouraged regular use of laxatives.  She will update.

## 2020-09-28 ENCOUNTER — Ambulatory Visit: Payer: PPO

## 2020-10-04 ENCOUNTER — Ambulatory Visit
Admission: RE | Admit: 2020-10-04 | Discharge: 2020-10-04 | Disposition: A | Discharge status: Home / Self Care | Payer: PPO | Source: Ambulatory Visit | Attending: Oncology | Admitting: Oncology

## 2020-10-04 ENCOUNTER — Other Ambulatory Visit: Payer: Self-pay

## 2020-10-04 DIAGNOSIS — F32A Depression, unspecified: Secondary | ICD-10-CM

## 2020-10-04 DIAGNOSIS — Z87891 Personal history of nicotine dependence: Secondary | ICD-10-CM | POA: Diagnosis not present

## 2020-10-04 DIAGNOSIS — I10 Essential (primary) hypertension: Secondary | ICD-10-CM

## 2020-10-04 DIAGNOSIS — E559 Vitamin D deficiency, unspecified: Secondary | ICD-10-CM | POA: Diagnosis not present

## 2020-10-04 DIAGNOSIS — F419 Anxiety disorder, unspecified: Secondary | ICD-10-CM

## 2020-10-04 DIAGNOSIS — Z122 Encounter for screening for malignant neoplasm of respiratory organs: Secondary | ICD-10-CM | POA: Diagnosis not present

## 2020-10-04 DIAGNOSIS — M810 Age-related osteoporosis without current pathological fracture: Secondary | ICD-10-CM | POA: Diagnosis not present

## 2020-10-04 DIAGNOSIS — K219 Gastro-esophageal reflux disease without esophagitis: Secondary | ICD-10-CM

## 2020-10-04 DIAGNOSIS — R5383 Other fatigue: Secondary | ICD-10-CM | POA: Diagnosis not present

## 2020-10-04 MED ORDER — METOPROLOL SUCCINATE ER 25 MG PO TB24: 25.0000 mg | ORAL_TABLET | Freq: Every day | ORAL | 0 refills | Status: DC

## 2020-10-04 MED ORDER — IRBESARTAN 300 MG PO TABS: 300.0000 mg | ORAL_TABLET | Freq: Every day | ORAL | 1 refills | Status: DC

## 2020-10-04 MED ORDER — VENLAFAXINE HCL ER 75 MG PO CP24: 75.0000 mg | ORAL_CAPSULE | Freq: Every day | ORAL | 1 refills | Status: DC

## 2020-10-04 MED ORDER — OMEPRAZOLE 20 MG PO CPDR: DELAYED_RELEASE_CAPSULE | ORAL | 1 refills | Status: DC

## 2020-10-06 ENCOUNTER — Encounter: Payer: Self-pay | Admitting: *Deleted

## 2020-10-06 DIAGNOSIS — M810 Age-related osteoporosis without current pathological fracture: Secondary | ICD-10-CM | POA: Diagnosis not present

## 2020-10-06 DIAGNOSIS — E559 Vitamin D deficiency, unspecified: Secondary | ICD-10-CM | POA: Diagnosis not present

## 2020-11-09 DIAGNOSIS — Z961 Presence of intraocular lens: Secondary | ICD-10-CM | POA: Diagnosis not present

## 2020-12-02 ENCOUNTER — Other Ambulatory Visit: Payer: Self-pay

## 2020-12-02 ENCOUNTER — Other Ambulatory Visit: Payer: Self-pay | Admitting: Unknown Physician Specialty

## 2020-12-02 ENCOUNTER — Ambulatory Visit
Admission: RE | Admit: 2020-12-02 | Discharge: 2020-12-02 | Disposition: A | Discharge status: Home / Self Care | Payer: PPO | Source: Ambulatory Visit | Attending: Unknown Physician Specialty | Admitting: Unknown Physician Specialty

## 2020-12-02 ENCOUNTER — Ambulatory Visit
Admission: RE | Admit: 2020-12-02 | Discharge: 2020-12-02 | Disposition: A | Discharge status: Home / Self Care | Payer: PPO | Attending: Unknown Physician Specialty | Admitting: Unknown Physician Specialty

## 2020-12-02 DIAGNOSIS — R0982 Postnasal drip: Secondary | ICD-10-CM | POA: Diagnosis not present

## 2020-12-02 DIAGNOSIS — H60339 Swimmer's ear, unspecified ear: Secondary | ICD-10-CM | POA: Diagnosis not present

## 2020-12-02 DIAGNOSIS — H6123 Impacted cerumen, bilateral: Secondary | ICD-10-CM | POA: Diagnosis not present

## 2020-12-02 DIAGNOSIS — R059 Cough, unspecified: Secondary | ICD-10-CM | POA: Diagnosis not present

## 2020-12-02 DIAGNOSIS — R519 Headache, unspecified: Secondary | ICD-10-CM | POA: Insufficient documentation

## 2020-12-02 DIAGNOSIS — G8929 Other chronic pain: Secondary | ICD-10-CM

## 2020-12-02 DIAGNOSIS — H9209 Otalgia, unspecified ear: Secondary | ICD-10-CM | POA: Diagnosis not present

## 2020-12-03 ENCOUNTER — Telehealth: Payer: Self-pay

## 2020-12-03 NOTE — Telephone Encounter (Signed)
I spoke with pt after her triage with access nurse; pt said has had swelling since 11/30/20; pt said she used to take a fluid pill but PCP stopped fluid pill 2 years ago. Swelling in both legs from knee down; pt having pain in ankles with pain level 4 - 5. No redness and no knots seen. No CP or SOB. Pt said when she has her feet up overnight the swelling might improve slightly but the swelling really does not go away. UC & ED precautions given and pt voiced understanding. Pt said she is OK right now. Sending note to Gentry Fitz NP as PCP and Dr Lorelei Pont and Lavina Hamman CMA.

## 2020-12-03 NOTE — Telephone Encounter (Signed)
Noted.  Recommend lab work at that visit including BNP amongst other labs.

## 2020-12-03 NOTE — Telephone Encounter (Signed)
[facility] - Client TELEPHONE ADVICE RECORD [facility] Patient Name: Morgan [name] Gender: Female DOB: 12/06/1945 Age: 75 Y 2 M 3 D Return Phone Number: [phone] (Primary) Address: City/State/Zip: [place] [address] Client [facility] - Client Client Site [place] - Day Physician Alma Friendly - NP Contact Type Call Who Is Calling Patient / Member / Family / Caregiver Call Type Triage / Clinical Relationship To Patient Self Return Phone Number [phone] (Primary) Chief Complaint Feet swelling Reason for Call Symptomatic / Request for Health Information Initial Comment Caller states hand, feet, and leg are swelling. Translation No Nurse Assessment Nurse: Ysidro Evert, RN, Levada Dy Date/Time Eilene Ghazi Time): 12/03/2020 1:25:48 PM Confirm and document reason for call. If symptomatic, describe symptoms. ---Caller states she is having swelling in her legs and ankles and they are aching. She has swelling in her hands as well. No fever Does the patient have any new or worsening symptoms? ---Yes Will a triage be completed? ---Yes Related visit to physician within the last 2 weeks? ---No Does the PT have any chronic conditions? (i.e. diabetes, asthma, this includes High risk factors for pregnancy, etc.) ---Yes List chronic conditions. ---hypertension Is this a behavioral health or substance abuse call? ---No Guidelines Guideline Title Affirmed Question Affirmed Notes Nurse Date/Time Eilene Ghazi Time) Leg Swelling and Edema [1] MODERATE leg swelling (e.g., swelling extends up to knees) AND [2] new-onset or worsening Earleen Reaper 12/03/2020 1:26:58 PM Disp. Time Eilene Ghazi Time) Disposition Final User 12/03/2020 1:31:08 PM See PCP within 24 Hours Yes Ysidro Evert, RN, Marin Shutter Disagree/Comply Comply Caller Understands Yes PLEASE NOTE: All timestamps contained within this report are represented as  [place] Standard Time. CONFIDENTIALTY NOTICE: This fax transmission is intended only for the addressee. It contains information that is legally privileged, confidential or otherwise protected from use or disclosure. If you are not the intended recipient, you are strictly prohibited from reviewing, disclosing, copying using or disseminating any of this information or taking any action in reliance on or regarding this information. If you have received this fax in error, please notify us immediately by telephone so that we can arrange for its return to [place]. Phone: [phone], Toll-Free: [phone], Fax: [phone] Page: 2 of 2 Call Id: [id number] PreDisposition Did not know what to do Care Advice Given Per Guideline SEE PCP WITHIN 24 HOURS: LEG SWELLING OR EDEMA: * Elevate your legs or try to lie down one or two times a day for 20 minutes. * Wear comfortable shoes. * Avoid socks with an elastic band at the top. * Breathing difficulty or chest pain occurs * You become worse CALL BACK IF: CARE ADVICE given per Leg Swelling and Edema (Adult) guideline. Referrals REFERRED TO PCP OFFICE

## 2020-12-03 NOTE — Telephone Encounter (Signed)
FYI pt has app with Dr. Edilia Bo on  12/06/2020

## 2020-12-06 ENCOUNTER — Encounter: Payer: Self-pay | Admitting: Family Medicine

## 2020-12-06 ENCOUNTER — Ambulatory Visit (INDEPENDENT_AMBULATORY_CARE_PROVIDER_SITE_OTHER): Payer: PPO | Admitting: Family Medicine

## 2020-12-06 ENCOUNTER — Other Ambulatory Visit: Payer: Self-pay

## 2020-12-06 VITALS — BP 120/68 | HR 95 | Temp 97.7°F | Ht 60.0 in | Wt 160.5 lb

## 2020-12-06 DIAGNOSIS — D508 Other iron deficiency anemias: Secondary | ICD-10-CM | POA: Diagnosis not present

## 2020-12-06 DIAGNOSIS — R600 Localized edema: Secondary | ICD-10-CM | POA: Diagnosis not present

## 2020-12-06 DIAGNOSIS — R0602 Shortness of breath: Secondary | ICD-10-CM

## 2020-12-06 DIAGNOSIS — D649 Anemia, unspecified: Secondary | ICD-10-CM

## 2020-12-06 LAB — CBC WITH DIFFERENTIAL/PLATELET
Basophils Absolute: 0.1 10*3/uL (ref 0.0–0.1)
Basophils Relative: 1.2 % (ref 0.0–3.0)
Eosinophils Absolute: 0.1 10*3/uL (ref 0.0–0.7)
Eosinophils Relative: 2 % (ref 0.0–5.0)
HCT: 35.3 % — ABNORMAL LOW (ref 36.0–46.0)
Hemoglobin: 11.8 g/dL — ABNORMAL LOW (ref 12.0–15.0)
Lymphocytes Relative: 24.4 % (ref 12.0–46.0)
Lymphs Abs: 1.8 10*3/uL (ref 0.7–4.0)
MCHC: 33.4 g/dL (ref 30.0–36.0)
MCV: 96.6 fl (ref 78.0–100.0)
Monocytes Absolute: 1.3 10*3/uL — ABNORMAL HIGH (ref 0.1–1.0)
Monocytes Relative: 17.8 % — ABNORMAL HIGH (ref 3.0–12.0)
Neutro Abs: 4.1 10*3/uL (ref 1.4–7.7)
Neutrophils Relative %: 54.6 % (ref 43.0–77.0)
Platelets: 413 10*3/uL — ABNORMAL HIGH (ref 150.0–400.0)
RBC: 3.66 Mil/uL — ABNORMAL LOW (ref 3.87–5.11)
RDW: 13.1 % (ref 11.5–15.5)
WBC: 7.5 10*3/uL (ref 4.0–10.5)

## 2020-12-06 LAB — BRAIN NATRIURETIC PEPTIDE: Pro B Natriuretic peptide (BNP): 48 pg/mL (ref 0.0–100.0)

## 2020-12-06 LAB — BASIC METABOLIC PANEL
BUN: 13 mg/dL (ref 6–23)
CO2: 29 mEq/L (ref 19–32)
Calcium: 9.1 mg/dL (ref 8.4–10.5)
Chloride: 101 mEq/L (ref 96–112)
Creatinine, Ser: 0.83 mg/dL (ref 0.40–1.20)
GFR: 70.05 mL/min (ref >60.00)
Glucose, Bld: 94 mg/dL (ref 70–99)
Potassium: 4.5 mEq/L (ref 3.5–5.1)
Sodium: 137 mEq/L (ref 135–145)

## 2020-12-06 LAB — HEPATIC FUNCTION PANEL
ALT: 12 U/L (ref 0–35)
AST: 19 U/L (ref 0–37)
Albumin: 4.3 g/dL (ref 3.5–5.2)
Alkaline Phosphatase: 54 U/L (ref 39–117)
Bilirubin, Direct: 0.1 mg/dL (ref 0.0–0.3)
Total Bilirubin: 0.4 mg/dL (ref 0.2–1.2)
Total Protein: 6.6 g/dL (ref 6.0–8.3)

## 2020-12-06 LAB — IBC + FERRITIN
Ferritin: 79.1 ng/mL (ref 10.0–291.0)
Iron: 96 ug/dL (ref 42–145)
Saturation Ratios: 26 % (ref 20.0–50.0)
Transferrin: 264 mg/dL (ref 212.0–360.0)

## 2020-12-06 LAB — VITAMIN B12: Vitamin B-12: >1506 pg/mL — ABNORMAL HIGH (ref 211–911)

## 2020-12-06 NOTE — Progress Notes (Unsigned)
Morgan T. Copland, MD, [place]  [facility] and [place] at [facility] [place], [address]  Phone: [phone]  FAX: [place] - 75 y.o. female  MRN [id number]  Date of Birth: 01/14/1946  Date: 12/06/2020  PCP: [name], NP  Referral: [name], NP  Chief Complaint  Patient presents with  . Joint Swelling    Legs/Feet/Ankles since last Tuesday    This visit occurred during the SARS-CoV-2 public health emergency.  Safety protocols were in place, including screening questions prior to the visit, additional usage of staff PPE, and extensive cleaning of exam room while observing appropriate contact time as indicated for disinfecting solutions.   Subjective:   Morgan [name] is a 75 y.o. very pleasant female patient with Body mass index is 31.35 kg/m. who presents with the following:  Swelling off and on but it has been a lot worse recently.   Has been flucuating some, but not as bad today. She reports this on the order of months, but over the last few weeks it has gotten significantly worse.  She is having the water swelling in the lower extremities and in her feet, but today it is really not all that bad.  She does have a picture on her phone that she does show me with some obvious edema.  pci and it looks pretty bad  Taking some iron now, taking some iron - there was some concern about anemia.   Review of Systems is noted in the HPI, as appropriate  Objective:   BP 120/68   Pulse 95   Temp 97.7 F (36.5 C) (Temporal)   Ht 5' (1.524 m)   Wt 160 lb 8 oz (72.8 kg)   SpO2 95%   BMI 31.35 kg/m   GEN: No acute distress; alert,appropriate. PULM: Breathing comfortably in no respiratory distress PSYCH: Normally interactive.  CV: RRR, no m/g/r  + 1+ LE edema on B extremities  Laboratory and Imaging Data:  Assessment and Plan:     ICD-10-CM   1.  Bilateral lower extremity edema  D66.4 Basic metabolic panel    CBC with Differential/Platelet    Hepatic function panel    Brain natriuretic peptide  2. Shortness of breath  R06.02 Brain natriuretic peptide  3. Other iron deficiency anemia  D50.8 IBC + Ferritin  4. Anemia, unspecified type  D64.9 Vitamin B12   Bilateral lower extremity edema with some shortness of breath, with a baseline history of some COPD in the past.  She also has a history of anemia and we will recheck a CBC as well.  Unclear exact origin.  Rule out multiple basic factors and labs including a cardiac BNP.  Review of notes and prior studies.  Medications Discontinued During This Encounter  Medication Reason  . Fluticasone-Umeclidin-Vilant (TRELEGY ELLIPTA) 100-62.5-25 MCG/INH AEPB Duplicate  . ibuprofen (ADVIL,MOTRIN) 200 MG tablet No longer needed (for PRN medications)   Orders Placed This Encounter  Procedures  . Basic metabolic panel  . CBC with Differential/Platelet  . Hepatic function panel  . Brain natriuretic peptide  . IBC + Ferritin  . Vitamin B12    Follow-up: No follow-ups on file.  Signed,  Maud Deed. Copland, MD   Outpatient Encounter Medications as of 12/06/2020  Medication Sig  . acetaminophen (TYLENOL) 500 MG tablet Take 500 mg by mouth every 6 (six) hours as needed.  Marland Kitchen albuterol (VENTOLIN HFA)  108 (90 Base) MCG/ACT inhaler INHALE 2 PUFFS INTO THE LUNGS EVERY 4 (FOUR) HOURS AS NEEDED FOR WHEEZING OR SHORTNESS OF BREATH.  Marland Kitchen Cholecalciferol 125 MCG (5000 UT) capsule Take 5,000 Units by mouth daily.  Marland Kitchen denosumab (PROLIA) 60 MG/ML SOSY injection Inject 60 mg into the skin every 6 (six) months. Last administration December 2021  . Fluticasone-Salmeterol (ADVAIR DISKUS) 250-50 MCG/DOSE AEPB Inhale 1 puff into the lungs 2 (two) times daily.  . Fluticasone-Umeclidin-Vilant (TRELEGY ELLIPTA) 100-62.5-25 MCG/INH AEPB Inhale 1 puff into the lungs daily.  . hydrALAZINE (APRESOLINE) 50 MG tablet  TAKE 1/2 TABLET EVERY MORNING AND 1 FULL TABLET IN THE EVENING FOR BLOOD PRESSURE  . irbesartan (AVAPRO) 300 MG tablet Take 1 tablet (300 mg total) by mouth daily. For blood pressure.  . metoprolol succinate (TOPROL-XL) 25 MG 24 hr tablet Take 1 tablet (25 mg total) by mouth daily. For blood pressure.  . Multiple Vitamins-Minerals (MULTIVITAMIN GUMMIES ADULTS) CHEW Chew 2 tablets by mouth daily.   Marland Kitchen omeprazole (PRILOSEC) 20 MG capsule Take 1 capsule by mouth every day for heartburn  . polyethylene glycol powder (GLYCOLAX/MIRALAX) powder Mix 17 g into water once to twice daily as needed for constipation.  . simvastatin (ZOCOR) 40 MG tablet Take 1 tablet (40 mg total) by mouth every evening. For cholesterol. NEED FOLLOW UP APPOINTMENT WITH RECHECK LABS  . venlafaxine XR (EFFEXOR-XR) 75 MG 24 hr capsule Take 1 capsule (75 mg total) by mouth daily with breakfast. For anxiety/depression.  . vitamin C (ASCORBIC ACID) 500 MG tablet Take 500 mg by mouth daily.  Marland Kitchen amoxicillin (AMOXIL) 500 MG capsule Take 500 mg by mouth 3 (three) times daily.  Marland Kitchen neomycin-polymyxin-hydrocortisone (CORTISPORIN) 3.5-10000-1 OTIC suspension 4 DROPS IN INFECTED EAR TWICE DAILY FOR 10 DAYS  . [DISCONTINUED] Fluticasone-Umeclidin-Vilant (TRELEGY ELLIPTA) 100-62.5-25 MCG/INH AEPB Inhale 1 puff into the lungs daily.  . [DISCONTINUED] ibuprofen (ADVIL,MOTRIN) 200 MG tablet Take 400-600 mg by mouth every 8 (eight) hours as needed for mild pain or moderate pain.   No facility-administered encounter medications on file as of 12/06/2020.

## 2020-12-07 ENCOUNTER — Ambulatory Visit: Payer: PPO | Admitting: Primary Care

## 2020-12-13 ENCOUNTER — Telehealth: Payer: Self-pay | Admitting: Primary Care

## 2020-12-13 NOTE — Telephone Encounter (Signed)
Pt called in wanted to know about Morgan [name] calling and going over lab results. And her concern is ankles and feet swelling

## 2020-12-13 NOTE — Telephone Encounter (Signed)
Called patient mail box full not able to leave message.

## 2020-12-15 NOTE — Telephone Encounter (Signed)
Pt called in again and wanted to speak to [facility]

## 2020-12-16 NOTE — Telephone Encounter (Signed)
Called patient she was seen by Dr. Lorelei Pont and had labs done. Has been following all his instructions and feels like she needs fluid pill. Have made office visit for next week to come in for f/u with you.

## 2020-12-16 NOTE — Telephone Encounter (Signed)
Noted, will evaluate. 

## 2020-12-17 ENCOUNTER — Other Ambulatory Visit: Payer: Self-pay

## 2020-12-17 DIAGNOSIS — E785 Hyperlipidemia, unspecified: Secondary | ICD-10-CM

## 2020-12-17 MED ORDER — SIMVASTATIN 40 MG PO TABS: 40.0000 mg | ORAL_TABLET | Freq: Every evening | ORAL | 0 refills | Status: DC

## 2020-12-22 ENCOUNTER — Encounter: Payer: Self-pay | Admitting: Primary Care

## 2020-12-22 ENCOUNTER — Other Ambulatory Visit: Payer: Self-pay

## 2020-12-22 ENCOUNTER — Ambulatory Visit (INDEPENDENT_AMBULATORY_CARE_PROVIDER_SITE_OTHER): Payer: PPO | Admitting: Primary Care

## 2020-12-22 DIAGNOSIS — R600 Localized edema: Secondary | ICD-10-CM | POA: Insufficient documentation
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
## 2020-12-22 NOTE — Patient Instructions (Signed)
Elevate your legs when resting.  Purchase some compression socks to use when active.   Remain active during your day balancing with rest.   Please notify me your swelling persists and does not reduce with elevation.  It was a pleasure to see you today!

## 2020-12-22 NOTE — Assessment & Plan Note (Signed)
None noted in the office today. Reviewed picture that patient brought, evident swelling noted.   Discussed the importance of elevating legs when resting, compression socks with activity, and importance of balancing rest and activity.  BNP negative. Other labs reviewed from last visit. Return precautions provided.

## 2020-12-30 ENCOUNTER — Other Ambulatory Visit: Payer: Self-pay

## 2020-12-30 DIAGNOSIS — I10 Essential (primary) hypertension: Secondary | ICD-10-CM

## 2020-12-30 MED ORDER — METOPROLOL SUCCINATE ER 25 MG PO TB24: 25.0000 mg | ORAL_TABLET | Freq: Every day | ORAL | 0 refills | Status: DC

## 2021-02-17 ENCOUNTER — Other Ambulatory Visit: Payer: Self-pay | Admitting: Primary Care

## 2021-02-17 DIAGNOSIS — J441 Chronic obstructive pulmonary disease with (acute) exacerbation: Secondary | ICD-10-CM

## 2021-02-17 DIAGNOSIS — J069 Acute upper respiratory infection, unspecified: Secondary | ICD-10-CM

## 2021-03-30 ENCOUNTER — Other Ambulatory Visit: Payer: Self-pay

## 2021-03-30 ENCOUNTER — Other Ambulatory Visit: Payer: Self-pay | Admitting: Family Medicine

## 2021-03-30 ENCOUNTER — Telehealth (INDEPENDENT_AMBULATORY_CARE_PROVIDER_SITE_OTHER): Payer: PPO | Admitting: Family Medicine

## 2021-03-30 ENCOUNTER — Encounter: Payer: Self-pay | Admitting: Family Medicine

## 2021-03-30 ENCOUNTER — Other Ambulatory Visit (INDEPENDENT_AMBULATORY_CARE_PROVIDER_SITE_OTHER): Payer: PPO

## 2021-03-30 VITALS — Ht 60.0 in

## 2021-03-30 DIAGNOSIS — J441 Chronic obstructive pulmonary disease with (acute) exacerbation: Secondary | ICD-10-CM | POA: Diagnosis not present

## 2021-03-30 DIAGNOSIS — J0100 Acute maxillary sinusitis, unspecified: Secondary | ICD-10-CM | POA: Diagnosis not present

## 2021-03-30 DIAGNOSIS — J329 Chronic sinusitis, unspecified: Secondary | ICD-10-CM | POA: Insufficient documentation

## 2021-03-30 DIAGNOSIS — J069 Acute upper respiratory infection, unspecified: Secondary | ICD-10-CM

## 2021-03-30 LAB — POC INFLUENZA A&B (BINAX/QUICKVUE)
Influenza A, POC: NEGATIVE
Influenza B, POC: NEGATIVE

## 2021-03-30 MED ORDER — AMOXICILLIN-POT CLAVULANATE 875-125 MG PO TABS: 1.0000 | ORAL_TABLET | Freq: Two times a day (BID) | ORAL | 0 refills | Status: DC

## 2021-03-30 MED ORDER — PREDNISONE 10 MG PO TABS: ORAL_TABLET | ORAL | 0 refills | Status: DC

## 2021-03-30 NOTE — Assessment & Plan Note (Signed)
Started on Friday (in setting of some chronic congestion)  Disc sympt care Px prednisone for wheeze/congestion  augmentin for likely sinusitis and OM covid and flu tests ordered inst to isolate until results and imp in symptoms  Update if not starting to improve in a week or if worsening  ER parameters discussed

## 2021-03-30 NOTE — Assessment & Plan Note (Signed)
Focal facial and ear pain after a period of congestion  Also some viral symptoms  Px augmentin and also prednisone  Update if not starting to improve in a week or if worsening
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
## 2021-03-30 NOTE — Patient Instructions (Signed)
Treat symptoms and drink fluids and rest Continue inhalers Take prednisone as directed for wheezing and congestion  Take augmentin for possible sinus and ear infection  The office will call to set up testing for covid and flu Update if not starting to improve in a week or if worsening  If severe please go to the ER

## 2021-03-31 LAB — SARS-COV-2, NAA 2 DAY TAT

## 2021-03-31 LAB — NOVEL CORONAVIRUS, NAA: SARS-CoV-2, NAA: NOT DETECTED

## 2021-04-06 ENCOUNTER — Encounter: Payer: Self-pay | Admitting: Pulmonary Disease

## 2021-04-06 ENCOUNTER — Other Ambulatory Visit: Payer: Self-pay

## 2021-04-06 ENCOUNTER — Ambulatory Visit: Payer: PPO | Admitting: Pulmonary Disease

## 2021-04-06 VITALS — BP 132/78 | HR 75 | Temp 98.7°F | Ht 61.0 in | Wt 148.2 lb

## 2021-04-06 DIAGNOSIS — J449 Chronic obstructive pulmonary disease, unspecified: Secondary | ICD-10-CM | POA: Diagnosis not present

## 2021-04-06 DIAGNOSIS — J069 Acute upper respiratory infection, unspecified: Secondary | ICD-10-CM | POA: Diagnosis not present

## 2021-04-06 DIAGNOSIS — J441 Chronic obstructive pulmonary disease with (acute) exacerbation: Secondary | ICD-10-CM | POA: Diagnosis not present

## 2021-04-06 MED ORDER — ALBUTEROL SULFATE HFA 108 (90 BASE) MCG/ACT IN AERS: 2.0000 | INHALATION_SPRAY | RESPIRATORY_TRACT | 11 refills | Status: DC | PRN

## 2021-04-06 MED ORDER — TRELEGY ELLIPTA 100-62.5-25 MCG/INH IN AEPB: 1.0000 | INHALATION_SPRAY | Freq: Every day | RESPIRATORY_TRACT | 5 refills | Status: DC

## 2021-04-06 MED ORDER — PREDNISONE 20 MG PO TABS: ORAL_TABLET | ORAL | 0 refills | Status: DC

## 2021-04-06 NOTE — Addendum Note (Signed)
Addended by: Elton Sin on: 04/06/2021 10:44 AM   Modules accepted: Orders

## 2021-04-06 NOTE — Progress Notes (Signed)
Morgan [name] [facility]    [id number]    1946-05-08  Primary Care Physician:Clark, [name], NP  Referring Physician: [name], NP [place],  [place] [id number]  Chief complaint:  Follow-up for COPD  HPI: 75 year old with history of COPD, GERD, hypertension, allergies, ex-smoker admitted in July 2020 for COPD exacerbation which is treated with steroids and nebulizer.  Follow-up with pulmonary in 2021 when she was switched from Advair to Trelegy  Pets: No pets Occupation: Retired Network engineer.  Used to work in [employer] when she was young Exposures: No known exposures, no mold, hot tub, Jacuzzi Smoking history: 50-75-pack-year smoker.  Quit in December 2019.  Took Vaping after quitting cigarettes and quit vaping in July 2020 Travel history: No significant travel history Relevant family history: No significant family history of lung disease  Interim history: Continues on Trelegy inhaler, albuterol as needed Recently developed sinus congestion and postnasal drip.  Started on Augmentin and short prednisone taper by her primary care. Has mild dyspnea on exertion.  Denies any fevers, chills  Outpatient Encounter Medications as of 04/06/2021  Medication Sig   acetaminophen (TYLENOL) 500 MG tablet Take 500 mg by mouth every 6 (six) hours as needed.   albuterol (VENTOLIN HFA) 108 (90 Base) MCG/ACT inhaler INHALE 2 PUFFS INTO THE LUNGS EVERY 4 (FOUR) HOURS AS NEEDED FOR WHEEZING OR SHORTNESS OF BREATH.   amoxicillin-clavulanate (AUGMENTIN) 875-125 MG tablet Take 1 tablet by mouth 2 (two) times daily.   Cholecalciferol 125 MCG (5000 UT) capsule Take 5,000 Units by mouth daily.   denosumab (PROLIA) 60 MG/ML SOSY injection Inject 60 mg into the skin every 6 (six) months. Last administration December 2021   hydrALAZINE (APRESOLINE) 50 MG tablet TAKE 1/2 TABLET EVERY MORNING AND 1 FULL TABLET IN THE EVENING FOR BLOOD PRESSURE   irbesartan (AVAPRO) 300 MG tablet Take  1 tablet (300 mg total) by mouth daily. For blood pressure.   metoprolol succinate (TOPROL-XL) 25 MG 24 hr tablet Take 1 tablet (25 mg total) by mouth daily. For blood pressure.   Multiple Vitamins-Minerals (MULTIVITAMIN GUMMIES ADULTS) CHEW Chew 2 tablets by mouth daily.    omeprazole (PRILOSEC) 20 MG capsule Take 1 capsule by mouth every day for heartburn   polyethylene glycol powder (GLYCOLAX/MIRALAX) powder Mix 17 g into water once to twice daily as needed for constipation.   predniSONE (DELTASONE) 10 MG tablet Take 3 pills once daily by mouth for 3 days, then 2 pills once daily for 3 days, then 1 pill once daily for 3 days and then stop   simvastatin (ZOCOR) 40 MG tablet Take 1 tablet (40 mg total) by mouth every evening. For cholesterol. NEED FOLLOW UP APPOINTMENT WITH RECHECK LABS   venlafaxine XR (EFFEXOR-XR) 75 MG 24 hr capsule Take 1 capsule (75 mg total) by mouth daily with breakfast. For anxiety/depression.   vitamin C (ASCORBIC ACID) 500 MG tablet Take 500 mg by mouth daily.   Fluticasone-Umeclidin-Vilant (TRELEGY ELLIPTA) 100-62.5-25 MCG/INH AEPB Inhale 1 puff into the lungs daily.   No facility-administered encounter medications on file as of 04/06/2021.   Physical Exam: Blood pressure 132/78, pulse 75, temperature 98.7 F (37.1 C), temperature source Oral, height 5\' 1"  (1.549 m), weight 148 lb 3.2 oz (67.2 kg), SpO2 98 %. Gen:      No acute distress HEENT:  EOMI, sclera anicteric Neck:     No masses; no thyromegaly Lungs:    Bilateral expiratory  wheeze CV:         Regular rate and rhythm; no murmurs Abd:      + bowel sounds; soft, non-tender; no palpable masses, no distension Ext:    No edema; adequate peripheral perfusion Skin:      Warm and dry; no rash Neuro: alert and oriented x 3 Psych: normal mood and affect   Data Reviewed: Imaging: Low-dose screening CT 09/10/2018- emphysema, subcentimeter pulmonary nodules measuring less than 3 mm.  Aortic atherosclerosis, coronary  artery calcification.  Low-dose screening CT 09/19/2019-emphysema, bronchial wall thickening.  Stable pulmonary nodules.  Low-dose screening CT 10/04/2020-moderate to severe emphysema, stable pulmonary nodules. I have reviewed the images personally.  PFTs: 02/24/2020 FVC 2.38 [92%], FEV1 1.61 [83%], F/F 68, TLC 4.97 [108%] DLCO 13.11 [25%] Mild obstruction and diffusion impairment.  Labs: CBC 05/05/2019-WBC 9.3, eos 13%, absolute eosinophil count 1209 CBC 05/21/2019-WBC 5.3, eos 5.1%, absolute eosinophil count 270 IgE 05/21/2019-126 Alpha-1 antitrypsin 06/21/2019-135, PI MM  Assessment:  COPD PFTs reviewed with moderate obstruction. Suspect she may have a component of asthma as well given elevated peripheral eosinophilia Continue Trelegy, albuterol.  She is finishing antibiotics and prednisone for sinus infection.  Still has wheezing on examination today Give additional prednisone 40 mg a day for 5 days  Flutter valve for mucociliary clearance  Ex-smoker Continue screening CT chest  Health maintenance 10/11/2016-  Prevnar 03/30/2020-Pneumovax  Plan/Recommendations: - Continue trelegy - Finish antibiotics, extend prednisone 40 mg a day for 5 days - Flutter valve  Marshell Garfinkel MD [facility] 04/06/2021, 9:36 AM  CC: [name], NP

## 2021-04-06 NOTE — Patient Instructions (Signed)
We will give you additional prednisone 40 mg a day for 5 days since you are still wheezing Finished antibiotics Continue trelegy and albuterol inhaler We will give you flutter valve for mucociliary clearance  Follow-up in 1 year

## 2021-04-07 ENCOUNTER — Telehealth: Payer: Self-pay | Admitting: Pulmonary Disease

## 2021-04-07 DIAGNOSIS — M810 Age-related osteoporosis without current pathological fracture: Secondary | ICD-10-CM | POA: Diagnosis not present

## 2021-04-07 DIAGNOSIS — E559 Vitamin D deficiency, unspecified: Secondary | ICD-10-CM | POA: Diagnosis not present

## 2021-04-07 DIAGNOSIS — R5383 Other fatigue: Secondary | ICD-10-CM | POA: Diagnosis not present

## 2021-04-07 DIAGNOSIS — J069 Acute upper respiratory infection, unspecified: Secondary | ICD-10-CM

## 2021-04-07 DIAGNOSIS — J441 Chronic obstructive pulmonary disease with (acute) exacerbation: Secondary | ICD-10-CM

## 2021-04-07 MED ORDER — ALBUTEROL SULFATE HFA 108 (90 BASE) MCG/ACT IN AERS: 2.0000 | INHALATION_SPRAY | RESPIRATORY_TRACT | 3 refills | Status: DC | PRN

## 2021-04-07 MED ORDER — TRELEGY ELLIPTA 100-62.5-25 MCG/INH IN AEPB: 1.0000 | INHALATION_SPRAY | Freq: Every day | RESPIRATORY_TRACT | 1 refills | Status: DC

## 2021-04-07 NOTE — Telephone Encounter (Signed)
Patient is requesting 90 days supply for Trelegy and albuterol inhalers, with [facility] mail order pharmacy.  New Trelegy and Albuterol prescriptions for 3 devices (90 days) sent to [facility].  Nothing further at this time.

## 2021-04-13 ENCOUNTER — Other Ambulatory Visit: Payer: Self-pay

## 2021-04-13 DIAGNOSIS — E785 Hyperlipidemia, unspecified: Secondary | ICD-10-CM

## 2021-04-13 DIAGNOSIS — I10 Essential (primary) hypertension: Secondary | ICD-10-CM

## 2021-04-13 MED ORDER — SIMVASTATIN 40 MG PO TABS
40.0000 mg | ORAL_TABLET | Freq: Every evening | ORAL | 0 refills | Status: DC
Start: 2021-04-13 — End: 2021-06-20

## 2021-04-13 MED ORDER — METOPROLOL SUCCINATE ER 25 MG PO TB24: 25.0000 mg | ORAL_TABLET | Freq: Every day | ORAL | 0 refills | Status: DC

## 2021-04-13 NOTE — Telephone Encounter (Signed)
Patient is overdue for CPE and will need to be seen for further refills.  Will send 30 day supply to mail order pharmacy. Morgan [name], will you help get her in?

## 2021-04-13 NOTE — Telephone Encounter (Signed)
Patient left a voicemail stating that she needs a refill on her medication sent to her mail order pharmacy.

## 2021-04-20 ENCOUNTER — Telehealth: Payer: Self-pay | Admitting: Oncology

## 2021-04-20 NOTE — Telephone Encounter (Signed)
Received a new hem referral from Dr. Layne Benton for abnl spep. Ms. Morgan [name] has been cld and scheduled to see Dr. Dorita Fray adon 7/19 at 11am. Pt aware to arrive 15 minutes early.

## 2021-04-22 NOTE — Telephone Encounter (Signed)
Appointment made patient aware will be fasting for 4 hours

## 2021-04-26 ENCOUNTER — Other Ambulatory Visit: Payer: Self-pay

## 2021-04-26 ENCOUNTER — Encounter: Payer: PPO | Admitting: Internal Medicine

## 2021-04-26 ENCOUNTER — Inpatient Hospital Stay: Payer: PPO | Attending: Oncology | Admitting: Oncology

## 2021-04-26 VITALS — BP 143/72 | HR 76 | Temp 98.3°F | Resp 18 | Ht 61.0 in | Wt 146.4 lb

## 2021-04-26 DIAGNOSIS — D649 Anemia, unspecified: Secondary | ICD-10-CM | POA: Diagnosis not present

## 2021-04-26 DIAGNOSIS — Z87891 Personal history of nicotine dependence: Secondary | ICD-10-CM

## 2021-04-26 DIAGNOSIS — R779 Abnormality of plasma protein, unspecified: Secondary | ICD-10-CM | POA: Insufficient documentation

## 2021-04-26 DIAGNOSIS — D472 Monoclonal gammopathy: Secondary | ICD-10-CM
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
## 2021-04-29 ENCOUNTER — Ambulatory Visit: Payer: PPO | Admitting: Primary Care

## 2021-05-17 ENCOUNTER — Telehealth: Payer: Self-pay | Admitting: Primary Care

## 2021-05-17 DIAGNOSIS — I10 Essential (primary) hypertension: Secondary | ICD-10-CM

## 2021-05-17 MED ORDER — HYDRALAZINE HCL 50 MG PO TABS: ORAL_TABLET | ORAL | 0 refills | Status: DC

## 2021-05-17 MED ORDER — METOPROLOL SUCCINATE ER 25 MG PO TB24
25.0000 mg | ORAL_TABLET | Freq: Every day | ORAL | 0 refills | Status: DC
Start: 2021-05-17 — End: 2021-06-24

## 2021-05-17 NOTE — Telephone Encounter (Signed)
  Encourage patient to contact the pharmacy for refills or they can request refills through [place]:  Please schedule appointment if longer than 1 year  NEXT APPOINTMENT DATE: 06/01/21  MEDICATION: hydrALAZINE (APRESOLINE) 50 MG tablet  Is the patient out of medication? yes  PHARMACY: mail order  Let patient know to contact pharmacy at the end of the day to make sure medication is ready.  Please notify patient to allow 48-72 hours to process  CLINICAL FILLS OUT ALL BELOW:   LAST REFILL:  QTY:  REFILL DATE:    OTHER COMMENTS:    Okay for refill?  Please advise

## 2021-05-17 NOTE — Telephone Encounter (Signed)
  Encourage patient to contact the pharmacy for refills or they can request refills through [place]:  Please schedule appointment if longer than 1 year  NEXT APPOINTMENT DATE: 06/01/21  MEDICATION: metoprolol succinate (TOPROL-XL) 25 MG 24 hr tablet  Is the patient out of medication? yes  PHARMACY:  [facility] [phone])  Let patient know to contact pharmacy at the end of the day to make sure medication is ready.  Please notify patient to allow 48-72 hours to process  CLINICAL FILLS OUT ALL BELOW:   LAST REFILL:  QTY:  REFILL DATE:    OTHER COMMENTS:    Okay for refill?  Please advise

## 2021-05-17 NOTE — Telephone Encounter (Signed)
Refills sent to pharmacy. 

## 2021-06-01 ENCOUNTER — Other Ambulatory Visit: Payer: Self-pay

## 2021-06-01 ENCOUNTER — Encounter: Payer: Self-pay | Admitting: Primary Care

## 2021-06-01 ENCOUNTER — Ambulatory Visit (INDEPENDENT_AMBULATORY_CARE_PROVIDER_SITE_OTHER): Payer: PPO | Admitting: Primary Care

## 2021-06-01 VITALS — BP 136/68 | HR 81 | Temp 97.1°F | Ht 61.0 in | Wt 144.0 lb

## 2021-06-01 DIAGNOSIS — F419 Anxiety disorder, unspecified: Secondary | ICD-10-CM | POA: Diagnosis not present

## 2021-06-01 DIAGNOSIS — I10 Essential (primary) hypertension: Secondary | ICD-10-CM | POA: Diagnosis not present

## 2021-06-01 DIAGNOSIS — K5900 Constipation, unspecified: Secondary | ICD-10-CM

## 2021-06-01 DIAGNOSIS — R7303 Prediabetes: Secondary | ICD-10-CM

## 2021-06-01 DIAGNOSIS — I700 Atherosclerosis of aorta: Secondary | ICD-10-CM

## 2021-06-01 DIAGNOSIS — M810 Age-related osteoporosis without current pathological fracture: Secondary | ICD-10-CM

## 2021-06-01 DIAGNOSIS — J449 Chronic obstructive pulmonary disease, unspecified: Secondary | ICD-10-CM

## 2021-06-01 DIAGNOSIS — E785 Hyperlipidemia, unspecified: Secondary | ICD-10-CM

## 2021-06-01 DIAGNOSIS — Z1231 Encounter for screening mammogram for malignant neoplasm of breast: Secondary | ICD-10-CM | POA: Diagnosis not present

## 2021-06-01 DIAGNOSIS — Z0000 Encounter for general adult medical examination without abnormal findings: Secondary | ICD-10-CM | POA: Diagnosis not present

## 2021-06-01 DIAGNOSIS — Z720 Tobacco use: Secondary | ICD-10-CM

## 2021-06-01 DIAGNOSIS — F32A Depression, unspecified: Secondary | ICD-10-CM

## 2021-06-01 DIAGNOSIS — K219 Gastro-esophageal reflux disease without esophagitis: Secondary | ICD-10-CM | POA: Diagnosis not present

## 2021-06-01 LAB — LIPID PANEL
Cholesterol: 166 mg/dL (ref 0–200)
HDL: 95.6 mg/dL (ref >39.00)
LDL Cholesterol: 62 mg/dL (ref 0–99)
NonHDL: 70.66
Total CHOL/HDL Ratio: 2
Triglycerides: 44 mg/dL (ref 0.0–149.0)
VLDL: 8.8 mg/dL (ref 0.0–40.0)

## 2021-06-01 LAB — COMPREHENSIVE METABOLIC PANEL
ALT: 22 U/L (ref 0–35)
AST: 31 U/L (ref 0–37)
Albumin: 4.1 g/dL (ref 3.5–5.2)
Alkaline Phosphatase: 68 U/L (ref 39–117)
BUN: 8 mg/dL (ref 6–23)
CO2: 28 mEq/L (ref 19–32)
Calcium: 9.2 mg/dL (ref 8.4–10.5)
Chloride: 100 mEq/L (ref 96–112)
Creatinine, Ser: 0.79 mg/dL (ref 0.40–1.20)
GFR: 74.08 mL/min (ref >60.00)
Glucose, Bld: 104 mg/dL — ABNORMAL HIGH (ref 70–99)
Potassium: 4.1 mEq/L (ref 3.5–5.1)
Sodium: 136 mEq/L (ref 135–145)
Total Bilirubin: 0.5 mg/dL (ref 0.2–1.2)
Total Protein: 6.8 g/dL (ref 6.0–8.3)

## 2021-06-01 LAB — HEMOGLOBIN A1C: Hgb A1c MFr Bld: 6 % (ref 4.6–6.5)

## 2021-06-01 NOTE — Assessment & Plan Note (Signed)
Following with pulmonology. Compliant to Trelegy daily, occasional use of albuterol during hotter days.  Continue current regimen.

## 2021-06-01 NOTE — Assessment & Plan Note (Signed)
Compliant to simvastatin 40 mg, repeat lipid panel pending.

## 2021-06-01 NOTE — Assessment & Plan Note (Signed)
Compliant to calcium, vitamin D, and Prolia injections.  Bone density scan UTD.

## 2021-06-01 NOTE — Assessment & Plan Note (Signed)
Doing well on venlafaxine ER 75 mg, continue same.
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
## 2021-06-01 NOTE — Assessment & Plan Note (Signed)
Will obtain Shingrix from pharmacy. Other vaccines UTD. Mammogram due, orders placed. Cologuard UTD, due in 2024.  Discussed the importance of a healthy diet and regular exercise in order for weight loss, and to reduce the risk of further co-morbidity.  Exam today stable. Labs pending.

## 2021-06-01 NOTE — Patient Instructions (Signed)
Stop by the lab prior to leaving today. I will notify you of your results once received.   [facility] to schedule your mamm[facility].   Please notify me by January 2023 if you do not hear from lung cancer screening program.  It was a pleasure to see you today!  Preventive Care 74 Years and Older, Female Preventive care refers to lifestyle choices and visits with your health care provider that can promote health and wellness. This includes: A yearly physical exam. This is also called an annual wellness visit. Regular dental and eye exams. Immunizations. Screening for certain conditions. Healthy lifestyle choices, such as: Eating a healthy diet. Getting regular exercise. Not using drugs or products that contain nicotine and tobacco. Limiting alcohol use. What can I expect for my preventive care visit? Physical exam Your health care provider will check your: Height and weight. These may be used to calculate your BMI (body mass index). BMI is a measurement that tells if you are at a healthy weight. Heart rate and blood pressure. Body temperature. Skin for abnormal spots. Counseling Your health care provider may ask you questions about your: Past medical problems. Family's medical history. Alcohol, tobacco, and drug use. Emotional well-being. Home life and relationship well-being. Sexual activity. Diet, exercise, and sleep habits. History of falls. Memory and ability to understand (cognition). Work and work Statistician. Pregnancy and menstrual history. Access to firearms. What immunizations do I need?  Vaccines are usually given at various ages, according to a schedule. Your health care provider will recommend vaccines for you based on your age, medicalhistory, and lifestyle or other factors, such as travel or where you work. What tests do I need? Blood tests Lipid and cholesterol levels. These may be checked every 5 years, or more often depending on your overall  health. Hepatitis C test. Hepatitis B test. Screening Lung cancer screening. You may have this screening every year starting at age 74 if you have a 30-pack-year history of smoking and currently smoke or have quit within the past 15 years. Colorectal cancer screening. All adults should have this screening starting at age 74 and continuing until age 74. Your health care provider may recommend screening at age 33 if you are at increased risk. You will have tests every 1-10 years, depending on your results and the type of screening test. Diabetes screening. This is done by checking your blood sugar (glucose) after you have not eaten for a while (fasting). You may have this done every 1-3 years. Mammogram. This may be done every 1-2 years. Talk with your health care provider about how often you should have regular mammograms. Abdominal aortic aneurysm (AAA) screening. You may need this if you are a current or former smoker. BRCA-related cancer screening. This may be done if you have a family history of breast, ovarian, tubal, or peritoneal cancers. Other tests STD (sexually transmitted disease) testing, if you are at risk. Bone density scan. This is done to screen for osteoporosis. You may have this done starting at age 19. Talk with your health care provider about your test results, treatment options,and if necessary, the need for more tests. Follow these instructions at home: Eating and drinking  Eat a diet that includes fresh fruits and vegetables, whole grains, lean protein, and low-fat dairy products. Limit your intake of foods with high amounts of sugar, saturated fats, and salt. Take vitamin and mineral supplements as recommended by your health care provider. Do not drink alcohol if your health care  provider tells you not to drink. If you drink alcohol: Limit how much you have to 0-1 drink a day. Be aware of how much alcohol is in your drink. In the [place] one drink equals one 12 oz  bottle of beer (355 mL), one 5 oz glass of wine (148 mL), or one 1 oz glass of hard liquor (44 mL).  Lifestyle Take daily care of your teeth and gums. Brush your teeth every morning and night with fluoride toothpaste. Floss one time each day. Stay active. Exercise for at least 30 minutes 5 or more days each week. Do not use any products that contain nicotine or tobacco, such as cigarettes, e-cigarettes, and chewing tobacco. If you need help quitting, ask your health care provider. Do not use drugs. If you are sexually active, practice safe sex. Use a condom or other form of protection in order to prevent STIs (sexually transmitted infections). Talk with your health care provider about taking a low-dose aspirin or statin. Find healthy ways to cope with stress, such as: Meditation, yoga, or listening to music. Journaling. Talking to a trusted person. Spending time with friends and family. Safety Always wear your seat belt while driving or riding in a vehicle. Do not drive: If you have been drinking alcohol. Do not ride with someone who has been drinking. When you are tired or distracted. While texting. Wear a helmet and other protective equipment during sports activities. If you have firearms in your house, make sure you follow all gun safety procedures. What's next? Visit your health care provider once a year for an annual wellness visit. Ask your health care provider how often you should have your eyes and teeth checked. Stay up to date on all vaccines. This information is not intended to replace advice given to you by your health care provider. Make sure you discuss any questions you have with your healthcare provider. Document Revised: 09/15/2020 Document Reviewed: 09/19/2018 Elsevier Patient Education  2022 [employer].

## 2021-06-01 NOTE — Assessment & Plan Note (Signed)
Compliant to lung cancer screening, due in December 2022.

## 2021-06-01 NOTE — Assessment & Plan Note (Signed)
Chronic and continued. Discussed use of daily to every other day Miralax. She will try this and update.

## 2021-06-01 NOTE — Assessment & Plan Note (Signed)
Controlled in the office today, continue irbestartan 300 mg daily, metoprolol succinate 25 mg. CMP pending.

## 2021-06-01 NOTE — Assessment & Plan Note (Signed)
Well controlled on omeprazole 20 mg, continue same.

## 2021-06-01 NOTE — Assessment & Plan Note (Signed)
Compliant to simvastatin 40 mg, continue same.  Repeat lipid panel pending.

## 2021-06-01 NOTE — Assessment & Plan Note (Signed)
Repeat A1C pending. 

## 2021-06-09 DIAGNOSIS — M5116 Intervertebral disc disorders with radiculopathy, lumbar region: Secondary | ICD-10-CM | POA: Diagnosis not present

## 2021-06-09 DIAGNOSIS — M5416 Radiculopathy, lumbar region: Secondary | ICD-10-CM | POA: Diagnosis not present

## 2021-06-20 ENCOUNTER — Telehealth: Payer: Self-pay

## 2021-06-20 DIAGNOSIS — I10 Essential (primary) hypertension: Secondary | ICD-10-CM

## 2021-06-20 DIAGNOSIS — F32A Depression, unspecified: Secondary | ICD-10-CM

## 2021-06-20 DIAGNOSIS — E785 Hyperlipidemia, unspecified: Secondary | ICD-10-CM

## 2021-06-20 DIAGNOSIS — F419 Anxiety disorder, unspecified: Secondary | ICD-10-CM

## 2021-06-20 DIAGNOSIS — K219 Gastro-esophageal reflux disease without esophagitis: Secondary | ICD-10-CM

## 2021-06-20 NOTE — Telephone Encounter (Signed)
Fax received last scrip on 04/13/2021 needs f/u and labs. Had lab and CPE on 06/01/21 ok refill?

## 2021-06-21 MED ORDER — SIMVASTATIN 40 MG PO TABS: 40.0000 mg | ORAL_TABLET | Freq: Every evening | ORAL | 3 refills | Status: DC

## 2021-06-24 MED ORDER — HYDRALAZINE HCL 50 MG PO TABS
ORAL_TABLET | ORAL | 3 refills | Status: DC
Start: 2021-06-24 — End: 2022-06-05

## 2021-06-24 MED ORDER — VENLAFAXINE HCL ER 75 MG PO CP24
75.0000 mg | ORAL_CAPSULE | Freq: Every day | ORAL | 3 refills | Status: DC
Start: 2021-06-24 — End: 2021-12-13

## 2021-06-24 MED ORDER — OMEPRAZOLE 20 MG PO CPDR: DELAYED_RELEASE_CAPSULE | ORAL | 3 refills | Status: DC

## 2021-06-24 MED ORDER — IRBESARTAN 300 MG PO TABS: 300.0000 mg | ORAL_TABLET | Freq: Every day | ORAL | 3 refills | Status: DC

## 2021-06-24 MED ORDER — METOPROLOL SUCCINATE ER 25 MG PO TB24: 25.0000 mg | ORAL_TABLET | Freq: Every day | ORAL | 3 refills | Status: DC

## 2021-06-24 NOTE — Telephone Encounter (Signed)
Please notify patient that I refilled the prescriptions that appeared needing refills.  Please have her check to see what else she may need refilled and contact her pharmacy to send me a request.

## 2021-06-24 NOTE — Telephone Encounter (Signed)
Pt stated she needs all her medication refill . Could not name them all  Encourage patient to contact the pharmacy for refills or they can request refills through [place]:  Please schedule appointment if longer than 1 year  NEXT APPOINTMENT DATE:  MEDICATION:metoprolol succinate (TOPROL-XL) 25 MG 24 hr tablet  Is the patient out of medication?   [facility] ([place] [place]       Let patient know to contact pharmacy at the end of the day to make sure medication is ready.  Please notify patient to allow 48-72 hours to process  CLINICAL FILLS OUT ALL BELOW:   LAST REFILL:  QTY:  REFILL DATE:    OTHER COMMENTS:    Okay for refill?  Please advise

## 2021-06-28 NOTE — Telephone Encounter (Signed)
Called patient to advise, voicemail box was full and could not leave a message

## 2021-06-29 DIAGNOSIS — I10 Essential (primary) hypertension: Secondary | ICD-10-CM | POA: Diagnosis not present

## 2021-06-29 DIAGNOSIS — M47812 Spondylosis without myelopathy or radiculopathy, cervical region: Secondary | ICD-10-CM | POA: Diagnosis not present

## 2021-06-29 DIAGNOSIS — Z6827 Body mass index (BMI) 27.0-27.9, adult: Secondary | ICD-10-CM | POA: Diagnosis not present

## 2021-07-03 ENCOUNTER — Other Ambulatory Visit: Payer: Self-pay | Admitting: *Deleted

## 2021-07-03 MED ORDER — TRELEGY ELLIPTA 100-62.5-25 MCG/INH IN AEPB: 1.0000 | INHALATION_SPRAY | Freq: Every day | RESPIRATORY_TRACT | 1 refills | Status: DC

## 2021-07-04 ENCOUNTER — Telehealth (INDEPENDENT_AMBULATORY_CARE_PROVIDER_SITE_OTHER): Payer: PPO | Admitting: Nurse Practitioner

## 2021-07-04 ENCOUNTER — Other Ambulatory Visit: Payer: Self-pay

## 2021-07-04 VITALS — Temp 99.5°F

## 2021-07-04 DIAGNOSIS — B370 Candidal stomatitis: Secondary | ICD-10-CM | POA: Diagnosis not present

## 2021-07-04 MED ORDER — NYSTATIN 100000 UNIT/ML MT SUSP: 5.0000 mL | Freq: Four times a day (QID) | OROMUCOSAL | 0 refills | Status: DC

## 2021-07-04 NOTE — Telephone Encounter (Signed)
Called patient she has received all refills that were needed. Will call if anything further we can help with.

## 2021-07-04 NOTE — Progress Notes (Signed)
[name], female    DOB: December 06, 1945, 75 y.o.   MRN: [id number]  Virtual visit completed through [place], a video enabled telemedicine application. Due to national recommendations of social distancing due to COVID-19, a virtual visit is felt to be most appropriate for this patient at this time. Reviewed limitations, risks, security and privacy concerns of performing a virtual visit and the availability of in person appointments. I also reviewed that there may be a patient responsible charge related to this service. The patient agreed to proceed.   After attempting to contact via video enabled program, it was unsuccessful and a telephone visit was done.  Patient location: home Provider location: [place] at [facility], office Persons participating in this virtual visit: patient, provider   If any vitals were documented, they were collected by patient at home unless specified below.    Temp 99.5 F (37.5 C) Comment: per patient   CC: Mouth spots Subjective:   HPI: Morgan [name] is a 75 y.o. female presenting on 07/04/2021 for Dental Pain (Mouth pain, started a little over a week ago, bottom lip hurts, white patches on tongue, red bumps on the edges of the tongue-sore. Has used Oral B solution, peroxide, salt water irrigation (burnt to do this))    Burning sensation in her mouth. No history of thrush she is on Treligy inhaler but states that she does rinse her mouth out after each use. She has not been on antibiotics in the recent past. Last time I see was June of 2022. She has tried the above regimens without relief.   Relevant past medical, surgical, family and social history reviewed and updated as indicated. Interim medical history since our last visit reviewed. Allergies and medications reviewed and updated. Outpatient Medications Prior to Visit  Medication Sig Dispense Refill   albuterol (VENTOLIN HFA) 108 (90 Base) MCG/ACT inhaler Inhale 2 puffs into the  lungs every 4 (four) hours as needed for wheezing or shortness of breath. 3 each 3   Cholecalciferol 125 MCG (5000 UT) capsule Take 5,000 Units by mouth daily.     denosumab (PROLIA) 60 MG/ML SOSY injection Inject 60 mg into the skin every 6 (six) months. Last administration December 2021     Fluticasone-Umeclidin-Vilant (TRELEGY ELLIPTA) 100-62.5-25 MCG/INH AEPB Inhale 1 puff into the lungs daily. 180 each 1   hydrALAZINE (APRESOLINE) 50 MG tablet TAKE 1/2 TABLET EVERY MORNING AND 1 FULL TABLET IN THE EVENING FOR BLOOD PRESSURE 135 tablet 3   irbesartan (AVAPRO) 300 MG tablet Take 1 tablet (300 mg total) by mouth daily. For blood pressure. 90 tablet 3   metoprolol succinate (TOPROL-XL) 25 MG 24 hr tablet Take 1 tablet (25 mg total) by mouth daily. For blood pressure. 90 tablet 3   Multiple Vitamins-Minerals (MULTIVITAMIN GUMMIES ADULTS) CHEW Chew 2 tablets by mouth daily.      omeprazole (PRILOSEC) 20 MG capsule Take 1 capsule by mouth every day for heartburn 90 capsule 3   polyethylene glycol powder (GLYCOLAX/MIRALAX) powder Mix 17 g into water once to twice daily as needed for constipation. 3350 g 0   simvastatin (ZOCOR) 40 MG tablet Take 1 tablet (40 mg total) by mouth every evening. For cholesterol. 90 tablet 3   venlafaxine XR (EFFEXOR-XR) 75 MG 24 hr capsule Take 1 capsule (75 mg total) by mouth daily with breakfast. For anxiety/depression. 90 capsule 3   vitamin C (ASCORBIC ACID) 500 MG tablet Take 500 mg by mouth daily.  No facility-administered medications prior to visit.     Per HPI unless specifically indicated in ROS section below Review of Systems  Constitutional:  Negative for chills and fever.  HENT:  Positive for mouth sores. Negative for sore throat and trouble swallowing.   Respiratory:  Negative for shortness of breath.   Cardiovascular:  Negative for chest pain.  Objective:  Temp 99.5 F (37.5 C) Comment: per patient  Wt Readings from Last 3 Encounters:  06/01/21  144 lb (65.3 kg)  04/26/21 146 lb 6.4 oz (66.4 kg)  04/06/21 148 lb 3.2 oz (67.2 kg)       Physical exam: Gen: alert, NAD, not ill appearing Pulm: speaks in complete sentences without increased work of breathing Psych: normal mood, normal thought content      Results for orders placed or performed in visit on 06/01/21  Lipid panel  Result Value Ref Range   Cholesterol 166 0 - 200 mg/dL   Triglycerides 44.0 0.0 - 149.0 mg/dL   HDL 95.60 >39.00 mg/dL   VLDL 8.8 0.0 - 40.0 mg/dL   LDL Cholesterol 62 0 - 99 mg/dL   Total CHOL/HDL Ratio 2    NonHDL 70.66   Comprehensive metabolic panel  Result Value Ref Range   Sodium 136 135 - 145 mEq/L   Potassium 4.1 3.5 - 5.1 mEq/L   Chloride 100 96 - 112 mEq/L   CO2 28 19 - 32 mEq/L   Glucose, Bld 104 (H) 70 - 99 mg/dL   BUN 8 6 - 23 mg/dL   Creatinine, Ser 0.79 0.40 - 1.20 mg/dL   Total Bilirubin 0.5 0.2 - 1.2 mg/dL   Alkaline Phosphatase 68 39 - 117 U/L   AST 31 0 - 37 U/L   ALT 22 0 - 35 U/L   Total Protein 6.8 6.0 - 8.3 g/dL   Albumin 4.1 3.5 - 5.2 g/dL   GFR 74.08 >60.00 mL/min   Calcium 9.2 8.4 - 10.5 mg/dL  Hemoglobin A1c  Result Value Ref Range   Hgb A1c MFr Bld 6.0 4.6 - 6.5 %   Assessment & Plan:   Problem List Items Addressed This Visit       Digestive   Thrush - Primary    We Morgan treat patient for presumed thrush.  She has not recently been on any oral antibiotics by report.  Patient does use an inhaled corticosteroid but states she rinses her mouth out after each use.  We Morgan treat her for thrush instructed patient if not getting better in 1 week she Morgan need to be evaluated in office.  Patient agrees.  Start nystatin 500,000 units 4 times a day swish, retain as long as possible to swallow.      Relevant Medications   nystatin (MYCOSTATIN) 100000 UNIT/ML suspension     No orders of the defined types were placed in this encounter.  No orders of the defined types were placed in this encounter.  Phone call was  5 mins  I discussed the assessment and treatment plan with the patient. The patient was provided an opportunity to ask questions and all were answered. The patient agreed with the plan and demonstrated an understanding of the instructions. The patient was advised to call back or seek an in-person evaluation if the symptoms worsen or if the condition fails to improve as anticipated.  This visit occurred during the SARS-CoV-2 public health emergency.  Safety protocols were in place, including screening questions prior to the visit, additional  usage of staff PPE, and extensive cleaning of exam room while observing appropriate contact time as indicated for disinfecting solutions.   Follow up plan: Return if symptoms worsen or fail to improve.  Romilda Garret, NP

## 2021-07-04 NOTE — Assessment & Plan Note (Signed)
We will treat patient for presumed thrush.  She has not recently been on any oral antibiotics by report.  Patient does use an inhaled corticosteroid but states she rinses her mouth out after each use.  We will treat her for thrush instructed patient if not getting better in 1 week she will need to be evaluated in office.  Patient agrees.  Start nystatin 500,000 units 4 times a day swish, retain as long as possible to swallow.

## 2021-07-18 DIAGNOSIS — Z1231 Encounter for screening mammogram for malignant neoplasm of breast: Secondary | ICD-10-CM | POA: Diagnosis not present

## 2021-07-18 LAB — HM MAMMOGRAPHY

## 2021-07-20 ENCOUNTER — Encounter: Payer: Self-pay | Admitting: Primary Care

## 2021-09-13 ENCOUNTER — Encounter (HOSPITAL_COMMUNITY): Payer: Self-pay | Admitting: Emergency Medicine

## 2021-09-13 ENCOUNTER — Telehealth: Payer: Self-pay

## 2021-09-13 ENCOUNTER — Ambulatory Visit (INDEPENDENT_AMBULATORY_CARE_PROVIDER_SITE_OTHER): Payer: PPO

## 2021-09-13 ENCOUNTER — Ambulatory Visit (HOSPITAL_COMMUNITY)
Admission: EM | Admit: 2021-09-13 | Discharge: 2021-09-13 | Disposition: A | Discharge status: Home / Self Care | Payer: PPO | Attending: Emergency Medicine | Admitting: Emergency Medicine

## 2021-09-13 ENCOUNTER — Other Ambulatory Visit: Payer: Self-pay

## 2021-09-13 DIAGNOSIS — R0602 Shortness of breath: Secondary | ICD-10-CM

## 2021-09-13 DIAGNOSIS — J189 Pneumonia, unspecified organism: Secondary | ICD-10-CM | POA: Diagnosis not present

## 2021-09-13 DIAGNOSIS — R059 Cough, unspecified: Secondary | ICD-10-CM

## 2021-09-13 DIAGNOSIS — R918 Other nonspecific abnormal finding of lung field: Secondary | ICD-10-CM | POA: Diagnosis not present

## 2021-09-13 DIAGNOSIS — J439 Emphysema, unspecified: Secondary | ICD-10-CM | POA: Diagnosis not present

## 2021-09-13 MED ORDER — METHYLPREDNISOLONE SODIUM SUCC 125 MG IJ SOLR
60.0000 mg | Freq: Once | INTRAMUSCULAR | Status: AC
  Administered 2021-09-13: 60 mg via INTRAMUSCULAR

## 2021-09-13 MED ORDER — BENZONATATE 100 MG PO CAPS: 100.0000 mg | ORAL_CAPSULE | Freq: Three times a day (TID) | ORAL | 0 refills | Status: DC

## 2021-09-13 MED ORDER — METHYLPREDNISOLONE SODIUM SUCC 125 MG IJ SOLR
INTRAMUSCULAR | Status: AC
  Filled 2021-09-13: qty 2

## 2021-09-13 MED ORDER — AMOXICILLIN-POT CLAVULANATE 875-125 MG PO TABS: 1.0000 | ORAL_TABLET | Freq: Two times a day (BID) | ORAL | 0 refills | Status: DC

## 2021-09-13 MED ORDER — PREDNISONE 20 MG PO TABS: 40.0000 mg | ORAL_TABLET | Freq: Every day | ORAL | 0 refills | Status: DC

## 2021-09-13 MED ORDER — DOXYCYCLINE HYCLATE 100 MG PO CAPS: 100.0000 mg | ORAL_CAPSULE | Freq: Two times a day (BID) | ORAL | 0 refills | Status: DC

## 2021-09-13 NOTE — Telephone Encounter (Signed)
[place] Day - Client TELEPHONE ADVICE RECORD AccessNurse Patient Name: Morgan [name] [facility] Gender: Female DOB: 1946-05-05 Age: 75 Y 11 M 13 D Return Phone Number: [phone] (Primary) Address[place]  [address] Client [facility] - Client Client Site [place] - Day Provider Alma Friendly - NP Contact Type Call Who Is Calling Patient / Member / Family / Caregiver Call Type Triage / Clinical Relationship To Patient Self Return Phone Number [phone] (Primary) Chief Complaint BREATHING - shortness of breath or sounds breathless Reason for Call Symptomatic / Request for [place] states she is exhausted with shortness of breath, She has back pain. Translation No Nurse Assessment Nurse: Ahorlu, RN, Shellia Cleverly Date/Time (Eastern Time): 09/12/2021 3:52:16 PM Confirm and document reason for call. If symptomatic, describe symptoms. ---caller states she has shortness of breath with walking has been ill for 8 days states she has history of copd exacerbation and that is what she feels like now Does the patient have any new or worsening symptoms? ---Yes Will a triage be completed? ---Yes Related visit to physician within the last 2 weeks? ---No Does the PT have any chronic conditions? (i.e. diabetes, asthma, this includes High risk factors for pregnancy, etc.) ---Yes List chronic conditions. ---copd Is this a behavioral health or substance abuse call? ---No Guidelines Guideline Title Affirmed Question Affirmed Notes Nurse Date/Time (Eastern Time) Breathing Difficulty Slow, shallow and weak breathing Ahorlu, RN, [place]ese 09/12/2021 3:53:53 PM Disp. Time Eilene Ghazi Time) Disposition Final User 09/12/2021 3:50:06 PM Send to Urgent Marina Goodell, [place] 09/12/2021 3:57:42 PM Call EMS 911 Now Yes Ahorlu, RN, Shellia Cleverly PLEASE NOTE: All timestamps  contained within this report are represented as [place] Standard Time. CONFIDENTIALTY NOTICE: This fax transmission is intended only for the addressee. It contains information that is legally privileged, confidential or otherwise protected from use or disclosure. If you are not the intended recipient, you are strictly prohibited from reviewing, disclosing, copying using or disseminating any of this information or taking any action in reliance on or regarding this information. If you have received this fax in error, please notify us immediately by telephone so that we can arrange for its return to [place]. Phone: [phone], Toll-Free: [phone], Fax: [phone] Page: 2 of 2 Call Id: [id number] Caller Disagree/Comply Disagree Caller Understands Yes PreDisposition InappropriateToAsk Care Advice Given Per Guideline CALL EMS 911 NOW: * Immediate medical attention is needed. You need to hang up and call 911 (or an ambulance). CARE ADVICE given per Breathing Difficulty (Adult) guideline. Comments User: Bing Neighbors, RN Date/Time Eilene Ghazi Time): 09/12/2021 4:00:17 PM inspiratory wheezes audible through phone line and repeated coughing caller already self administered asthma copd medications without improvement states she does not wish to go to the hospital will wait for her husband and see if she really needs to go informed her that she needed immediate medical attention verbalized understanding but disagree

## 2021-09-13 NOTE — Telephone Encounter (Signed)
I spoke with pt; pt is SOB upon exertion; pt has been in bed for 8 days with either covid or flu. Pt has labored breathing when trying to do anything. Pt said she had fever, bad H/A last wk, pt has COPD; pt is hurting in center of back. No CP;pt has prod cough with yellow phlegm. Pt has not tested for covid or flu. Pt does not want to go to ED but pt said she is going to [facility] on [address] in [place]. Sending note to Gentry Fitz NP and Lavina Hamman CMA I will also teams Joellen CMA.

## 2021-09-13 NOTE — Telephone Encounter (Addendum)
Noted and agree that she needs evaluation ASAP. Can we call to check on her tomorrow?

## 2021-09-13 NOTE — Discharge Instructions (Signed)
Your chest x-ray showed that you have pneumonia in your left lobes  Take Augmentin twice a day for the next 7 days  Take doxycycline twice a day for the next 7 days  Begin prednisone course tomorrow morning taking every morning with food for 5 days  You may use Tessalon pill as needed to help calm coughing  If your symptoms persist but do not worsen you may follow-up with your primary care doctor in the next 2 weeks  If your symptoms worsen please go to the nearest emergency department for further evaluation
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
## 2021-09-13 NOTE — ED Triage Notes (Signed)
Pt presents with productive cough, sob, wheezing, and back pain Xs 9 days.

## 2021-09-14 NOTE — Telephone Encounter (Signed)
Noted  

## 2021-09-14 NOTE — Telephone Encounter (Signed)
Called patient was ween at walking and is being treated for pneumonia. If no improvement or changes in symptoms she will get evaluated by ed/ uc and let us know. She has not noticed much improvement at this time but has just started medications.

## 2021-09-20 ENCOUNTER — Telehealth: Payer: Self-pay | Admitting: Primary Care

## 2021-09-20 NOTE — Chronic Care Management (AMB) (Signed)
°  Chronic Care Management   Note  09/20/2021 Name: Morgan [name] [facility] MRN: [id number] DOB: April 11, 1946  Morgan [name] [facility] is a 75 y.o. year old female who is a primary care patient of [name], NP. I reached out to [place] by phone today in response to a referral sent by [name]'s PCP, [name], NP.   [name] was given information about Chronic Care Management services today including:  CCM service includes personalized support from designated clinical staff supervised by her physician, including individualized plan of care and coordination with other care providers 24/7 contact phone numbers for assistance for urgent and routine care needs. Service will only be billed when office clinical staff spend 20 minutes or more in a month to coordinate care. Only one practitioner may furnish and bill the service in a calendar month. The patient may stop CCM services at any time (effective at the end of the month) by phone call to the office staff.   Patient agreed to services and verbal consent obtained.   Follow up plan:   [name] Secretary/administrator
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
## 2021-09-23 ENCOUNTER — Ambulatory Visit: Payer: PPO

## 2021-09-23 DIAGNOSIS — M87180 Osteonecrosis due to drugs, jaw: Secondary | ICD-10-CM | POA: Diagnosis not present

## 2021-09-23 DIAGNOSIS — M879 Osteonecrosis, unspecified: Secondary | ICD-10-CM | POA: Diagnosis not present

## 2021-10-09 HISTORY — PX: BACK SURGERY: SHX140

## 2021-10-20 ENCOUNTER — Inpatient Hospital Stay: Payer: PPO | Attending: Oncology

## 2021-10-25 ENCOUNTER — Telehealth: Payer: Self-pay

## 2021-10-25 NOTE — Progress Notes (Signed)
Chronic Care Management Pharmacy Assistant   Name: Morgan [name] [facility]  MRN: [id number] DOB: [birth year]  Reason for Encounter: CCM (Initial Questions)  Recent office visits:  06/01/2021 - Alma Friendly, NP - Patient presented for annual exam. Labs: CMP, A1c and Lipid panel. Ordered: Mammogram. No medication changes.   Recent consult visits:  09/13/2021 [facility] - Patient presented for pneumonia of left lower lob of lung. Start: amoxicillin-clavulanate (AUGMENTIN) 875-125 MG tablet, benzonatate (TESSALON) 100 MG capsule, doxycycline (VIBRAMYCIN) 100 MG capsule and predniSONE (DELTASONE) 20 MG tablet. 07/18/2021 - Emmit Pomfret - Radiology - Patient presented for Mammogram.  07/04/2021 - Karl Ito, PN - Pain Medicine - Video Visit - Patient presented for thrush. Start: nystatin (MYCOSTATIN) 100000 UNIT/ML suspension. 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 06/12/2021 - Kristeen Miss - Neurosurgery - Patient presented for intervertebral disc disorders with radiculopathy, lumbar region radiculopathy, lumbar region. No other information.  04/26/2021 - Zola Button, MD - Oncology - Patient presented for evaluation for a plasma cell disorder. Labs: CBC, CMP, Kappa/lambda light chains and multiple myeloma panel.   Hospital visits:  None in previous 6 months  Medications: Outpatient Encounter Medications as of 10/25/2021  Medication Sig   albuterol (VENTOLIN HFA) 108 (90 Base) MCG/ACT inhaler Inhale 2 puffs into the lungs every 4 (four) hours as needed for wheezing or shortness of breath.   amoxicillin-clavulanate (AUGMENTIN) 875-125 MG tablet Take 1 tablet by mouth every 12 (twelve) hours.   benzonatate (TESSALON) 100 MG capsule Take 1 capsule (100 mg total) by mouth every 8 (eight) hours.   Cholecalciferol 125 MCG (5000 UT) capsule Take 5,000 Units by mouth  daily.   denosumab (PROLIA) 60 MG/ML SOSY injection Inject 60 mg into the skin every 6 (six) months. Last administration December 2021   doxycycline (VIBRAMYCIN) 100 MG capsule Take 1 capsule (100 mg total) by mouth 2 (two) times daily.   Fluticasone-Umeclidin-Vilant (TRELEGY ELLIPTA) 100-62.5-25 MCG/INH AEPB Inhale 1 puff into the lungs daily.   hydrALAZINE (APRESOLINE) 50 MG tablet TAKE 1/2 TABLET EVERY MORNING AND 1 FULL TABLET IN THE EVENING FOR BLOOD PRESSURE   irbesartan (AVAPRO) 300 MG tablet Take 1 tablet (300 mg total) by mouth daily. For blood pressure.   metoprolol succinate (TOPROL-XL) 25 MG 24 hr tablet Take 1 tablet (25 mg total) by mouth daily. For blood pressure.   Multiple Vitamins-Minerals (MULTIVITAMIN GUMMIES ADULTS) CHEW Chew 2 tablets by mouth daily.    nystatin (MYCOSTATIN) 100000 UNIT/ML suspension Take 5 mLs (500,000 Units total) by mouth 4 (four) times daily.   omeprazole (PRILOSEC) 20 MG capsule Take 1 capsule by mouth every day for heartburn   polyethylene glycol powder (GLYCOLAX/MIRALAX) powder Mix 17 g into water once to twice daily as needed for constipation.   predniSONE (DELTASONE) 20 MG tablet Take 2 tablets (40 mg total) by mouth daily.   simvastatin (ZOCOR) 40 MG tablet Take 1 tablet (40 mg total) by mouth every evening. For cholesterol.   venlafaxine XR (EFFEXOR-XR) 75 MG 24 hr capsule Take 1 capsule (75 mg total) by mouth daily with breakfast. For anxiety/depression.   vitamin C (ASCORBIC ACID) 500 MG tablet Take 500 mg by mouth daily.   No facility-administered encounter medications on file as of 10/25/2021.   Lab Results  Component Value Date/Time   HGBA1C 6.0 06/01/2021 01:11 PM   HGBA1C 5.7 03/30/2020 10:26 AM  BP Readings from Last 3 Encounters:  09/13/21 (!) 149/88  06/01/21 136/68  04/26/21 (!) 143/72   Patient contacted to review initial questions prior to visit with Charlene Brooke.  Have you seen any other providers since your last  visit with PCP? Yes  Any changes in your medications or health? No  Any side effects from any medications? No  Do you have an symptoms or problems not managed by your medications? No  Any concerns about your health right now? No  Has your provider asked that you check blood pressure, blood sugar, or follow special diet at home? No   Do you get any type of exercise on a regular basis? No Patient states she just walks in the house.   Can you think of a goal you would like to reach for your health? Yes - Patient is trying to quit smoking.   Do you have any problems getting your medications? No  Is there anything that you would like to discuss during the appointment? No  Spoke with patient and reminded them to have all medications, supplements and any blood glucose and blood pressure readings available for review with pharmacist, at their telephone visit on 11/01/2021 at 9:00.   Star Rating Drugs:  Medication:   Last Fill: Day Supply Irbesartan 300 mg  09/04/2021 90 Simvastatin 40 mg  08/29/2021 90   Care Gaps: Annual wellness visit in last year? No 09/21/2020 Most Recent BP reading: 149/88 on 09/13/2021  Charlene Brooke, CPP notified  Marijean Niemann, [place] Clinical Pharmacy Assistant [phone]  Time Spent: 40 Minutes

## 2021-10-27 ENCOUNTER — Inpatient Hospital Stay: Payer: PPO | Admitting: Oncology

## 2021-10-28 DIAGNOSIS — M87180 Osteonecrosis due to drugs, jaw: Secondary | ICD-10-CM | POA: Diagnosis not present

## 2021-10-31 NOTE — Progress Notes (Signed)
Chronic Care Management Pharmacy Note  11/03/2021 Name:  Morgan [name] MRN:  [id number] DOB:  Apr 10, 1946  Summary: -Initial CCM visit - pt endorses compliance with medications -Pt reports depression is somewhat worse, she thinks venlafaxine is not as effective as it used to be -Pt reports Trelegy inhaler works well but is not very affordable. 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 Of note she is also taking omeprazole daily which can worsen bone density over time.  Recommendations/Changes made from today's visit: -Recommend increasing venlafaxine to 2 tablets (150 mg/day) -Recommend trial of Breztri inhaler to replace Trelegy - pt would qualify for [place] pt assistance immediately (consulting with pulmonary for change) -Recommend trial off omeprazole - taper to every other day for 1-2 weeks then stop. Can use OTC Pepcid in it's place. Can restart PPI if symptoms are much worse.  Plan: -Pharmacist follow up televisit scheduled for 1 month -Annual exam with PCP due Aug 2023. Not yet scheduled.   Subjective: Morgan [name] is an 77 y.o. year old female who is a primary patient of [name], NP.  The CCM team was consulted for assistance with disease management and care coordination needs.    Engaged with patient by telephone for initial visit in response to provider referral for pharmacy case management and/or care coordination services.   Consent to Services:  The patient was given the following information about Chronic Care Management services today, agreed to services, and gave verbal consent: 1. CCM service includes personalized support from designated clinical staff supervised by the primary care provider, including individualized plan of care and  coordination with other care providers 2. 24/7 contact phone numbers for assistance for urgent and routine care needs. 3. Service will only be billed when office clinical staff spend 20 minutes or more in a month to coordinate care. 4. Only one practitioner may furnish and bill the service in a calendar month. 5.The patient may stop CCM services at any time (effective at the end of the month) by phone call to the office staff. 6. The patient will be responsible for cost sharing (co-pay) of up to 20% of the service fee (after annual deductible is met). Patient agreed to services and consent obtained.  Patient Care Team: [name], NP as PCP - General (Internal Medicine) Verner Chol, MD as Consulting Physician (Sports Medicine) Charlton Haws, [name] as Pharmacist (Pharmacist)   Patient lives at home with her husband.  Recent office visits: 06/01/2021 - Alma Friendly, NP - Patient presented for annual exam. Labs: CMP, A1c and Lipid panel. Ordered: Mammogram. No medication changes.   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 Start Peridex BID, tobacco cessation. F/U 1 month. 09/13/2021 - [facility] - Patient presented for pneumonia of left lower lob of lung. Start: amoxicillin-clavulanate x 7 days, doxycyline x 10 days, prednisone x 10 days, benzonatate. 07/18/2021 - Emm[facility] - Radiology - Patient presented for Mammogram.  07/04/2021 - Karl Ito, PN - Pain Medicine - Video Visit - Patient presented for thrush. Start: nystatin (MYCOSTATIN) 100000 UNIT/ML suspension. 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 06/12/2021 -  Kristeen Miss - Neurosurgery - Patient presented for intervertebral disc disorders with radiculopathy, lumbar region radiculopathy, lumbar region. No other information.  04/26/2021  - Zola Button, MD - Heme/onc -Initial eval for plasma cell disorder - determined unlikely. Reaction to acute illness or medication possible. Repeat labwork in 6 months.  Hospital visits: None in previous 6 months   Objective:  Lab Results  Component Value Date   CREATININE 0.79 06/01/2021   BUN 8 06/01/2021   GFR 74.08 06/01/2021   GFRNONAA >60 05/07/2019   GFRAA >60 05/07/2019   NA 136 06/01/2021   K 4.1 06/01/2021   CALCIUM 9.2 06/01/2021   CO2 28 06/01/2021   GLUCOSE 104 (H) 06/01/2021    Lab Results  Component Value Date/Time   HGBA1C 6.0 06/01/2021 01:11 PM   HGBA1C 5.7 03/30/2020 10:26 AM   GFR 74.08 06/01/2021 01:11 PM   GFR 70.05 12/06/2020 12:54 PM    Last diabetic Eye exam: No results found for: HMDIABEYEEXA  Last diabetic Foot exam: No results found for: HMDIABFOOTEX   Lab Results  Component Value Date   CHOL 166 06/01/2021   HDL 95.60 06/01/2021   LDLCALC 62 06/01/2021   TRIG 44.0 06/01/2021   CHOLHDL 2 06/01/2021    Hepatic Function Latest Ref Rng & Units 06/01/2021 12/06/2020 03/30/2020  Total Protein 6.0 - 8.3 g/dL 6.8 6.6 6.2  Albumin 3.5 - 5.2 g/dL 4.1 4.3 4.3  AST 0 - 37 U/L 31 19 29   ALT 0 - 35 U/L 22 12 22   Alk Phosphatase 39 - 117 U/L 68 54 80  Total Bilirubin 0.2 - 1.2 mg/dL 0.5 0.4 0.5  Bilirubin, Direct 0.0 - 0.3 mg/dL - 0.1 -    Lab Results  Component Value Date/Time   TSH 0.81 03/12/2019 11:06 AM   TSH 0.68 08/09/2018 12:18 PM    CBC Latest Ref Rng & Units 12/06/2020 03/30/2020 05/21/2019  WBC 4.0 - 10.5 K/uL 7.5 7.2 5.3  Hemoglobin 12.0 - 15.0 g/dL 11.8(L) 11.0(L) 11.3(L)  Hematocrit 36.0 - 46.0 % 35.3(L) 32.4(L) 33.6(L)  Platelets 150.0 - 400.0 K/uL 413.0(H) 366.0 342.0    No results found for: VD25OH  Clinical ASCVD: Yes  The 10-year ASCVD risk score (Arnett DK, et al., 2019) is: 24.1%   Values used to calculate the score:     Age: 74 years     Sex: Female     Is Non-Hispanic African American: No     Diabetic: No      Tobacco smoker: No     Systolic Blood Pressure: 709 mmHg     Is BP treated: Yes     HDL Cholesterol: 95.6 mg/dL     Total Cholesterol: 166 mg/dL    Depression screen [facility] 2/9 06/01/2021 09/21/2020 04/26/2017  Decreased Interest 0 1 3  Down, Depressed, Hopeless 0 1 3  PHQ - 2 Score 0 2 6  Altered sleeping 0 0 1  Tired, decreased energy 0 0 2  Change in appetite 0 0 2  Feeling bad or failure about yourself  0 0 0  Trouble concentrating 0 0 3  Moving slowly or fidgety/restless 0 0 3  Suicidal thoughts 0 0 0  PHQ-9 Score 0 2 17  Difficult doing work/chores Not difficult at all Not difficult at all -     Social History   Tobacco Use  Smoking Status Former   Packs/day: 1.00   Years: 56.00   Pack years: 56.00   Types: Cigarettes   Quit  date: 09/08/2018   Years since quitting: 3.1  Smokeless Tobacco Never  Tobacco Comments   stopped cigs Dec 2019   BP Readings from Last 3 Encounters:  09/13/21 (!) 149/88  06/01/21 136/68  04/26/21 (!) 143/72   Pulse Readings from Last 3 Encounters:  09/13/21 73  06/01/21 81  04/26/21 76   Wt Readings from Last 3 Encounters:  06/01/21 144 lb (65.3 kg)  04/26/21 146 lb 6.4 oz (66.4 kg)  04/06/21 148 lb 3.2 oz (67.2 kg)   BMI Readings from Last 3 Encounters:  06/01/21 27.21 kg/m  04/26/21 27.66 kg/m  04/06/21 28.00 kg/m    Assessment/Interventions: Review of patient past medical history, allergies, medications, health status, including review of consultants reports, laboratory and other test data, was performed as part of comprehensive evaluation and provision of chronic care management services.   SDOH:  (Social Determinants of Health) assessments and interventions performed: Yes SDOH Interventions    Flowsheet Row Most Recent Value  SDOH Interventions   Food Insecurity Interventions Intervention Not Indicated  Financial Strain Interventions Other (Comment)  [switching Trelegy to [employer] for PAP]      SDOH Screenings    Alcohol Screen: Not on file  Depression (PHQ2-9): Low Risk    PHQ-2 Score: 0  Financial Resource Strain: Medium Risk   Difficulty of Paying Living Expenses: Somewhat hard  Food Insecurity: No Food Insecurity   Worried About Charity fundraiser in the Last Year: Never true   Ran Out of Food in the Last Year: Never true  Housing: Not on file  Physical Activity: Not on file  Social Connections: Not on file  Stress: Not on file  Tobacco Use: Medium Risk   Smoking Tobacco Use: Former   Smokeless Tobacco Use: Never   Passive Exposure: Not on file  Transportation Needs: Not on file    [place]  Allergies  Allergen Reactions   Hctz [Hydrochlorothiazide] Other (See Comments)    Hyponatremia    Amlodipine Swelling    Ankle/leg swelling   Azithromycin Nausea And Vomiting   Codeine Itching    Makes me crazy    Medications Reviewed Today     Reviewed by Charlton Haws, [facility] (Pharmacist) on 11/01/21 at [place] List Status: <None>   Medication Order Taking? Sig Documenting Provider Last Dose Status Informant  albuterol (VENTOLIN HFA) 108 (90 Base) MCG/ACT inhaler [id number] Yes Inhale 2 puffs into the lungs every 4 (four) hours as needed for wheezing or shortness of breath. Marshell Garfinkel, MD Taking Active   Patient not taking:  Discontinued 11/01/21 0944 (Completed Course)   Patient not taking:  Discontinued 11/01/21 0944 (Completed Course)   Cholecalciferol 125 MCG (5000 UT) capsule [id number] Yes Take 5,000 Units by mouth daily. [provider] Taking Active Self  denosumab (PROLIA) 60 MG/ML SOSY injection [id number] Yes Inject 60 mg into the skin every 6 (six) months. Last administration December 2021 [provider] Taking Active Multiple Informants           Med Note Vella Raring Dec 06, 2020 12:17 PM)    doxycycline (VIBRAMYCIN) 100 MG capsule [id number] Yes Take 1 capsule (100 mg total) by mouth 2 (two) times daily. Hans Eden, NP  Taking Active   Fluticasone-Umeclidin-Vilant (TRELEGY ELLIPTA) 100-62.5-25 MCG/INH AEPB [id number] Yes Inhale 1 puff into the lungs daily. Marshell Garfinkel, MD Taking Active   hydrALAZINE (APRESOLINE) 50 MG tablet [id number] Yes TAKE 1/2 TABLET EVERY MORNING AND  1 FULL TABLET IN THE EVENING FOR BLOOD PRESSURE [name], NP Taking Active   irbesartan (AVAPRO) 300 MG tablet [id number] Yes Take 1 tablet (300 mg total) by mouth daily. For blood pressure. [name], NP Taking Active   metoprolol succinate (TOPROL-XL) 25 MG 24 hr tablet [id number] Yes Take 1 tablet (25 mg total) by mouth daily. For blood pressure. [name], NP Taking Active   Multiple Vitamins-Minerals (MULTIVITAMIN GUMMIES ADULTS) CHEW [id number] Yes Chew 2 tablets by mouth daily.  [provider] Taking Active Self  Patient not taking:  Discontinued 11/01/21 0944 (Completed Course)   omeprazole (PRILOSEC) 20 MG capsule [id number] Yes Take 1 capsule by mouth every day for heartburn [name], NP Taking Active   polyethylene glycol powder (GLYCOLAX/MIRALAX) powder [id number] No Mix 17 g into water once to twice daily as needed for constipation.  Patient not taking: Reported on 11/01/2021   [name], NP Not Taking Active Multiple Informants  Patient not taking:  Discontinued 11/01/21 0945 (Completed Course)   simvastatin (ZOCOR) 40 MG tablet [id number] Yes Take 1 tablet (40 mg total) by mouth every evening. For cholesterol. [name], NP Taking Active   venlafaxine XR (EFFEXOR-XR) 75 MG 24 hr capsule [id number] Yes Take 1 capsule (75 mg total) by mouth daily with breakfast. For anxiety/depression. [name], NP Taking Active   vitamin C (ASCORBIC ACID) 500 MG tablet [id number] No Take 500 mg by mouth daily.  Patient not taking: Reported on 11/01/2021   [provider] Not Taking Active Self            Patient Active Problem List   Diagnosis Date Noted   Thrush  07/04/2021   Viral URI with cough 03/30/2021   Sinusitis 03/30/2021   Bilateral lower extremity edema 12/22/2020   Sleep disturbance 09/23/2020   Preventative health care 03/30/2020   COPD exacerbation ([place]) 05/05/2019   Prediabetes 03/05/2019   Abdominal wall bulge 12/16/2018   Tobacco abuse 08/09/2018   COPD (chronic obstructive pulmonary disease) ([place]) 08/09/2018   Osteoporosis 08/09/2018   Medicare annual wellness visit, subsequent 10/11/2016   Constipation 06/28/2016   Back pain 10/28/2015   Atherosclerosis of aorta ([place]) 09/17/2015   Hyperlipidemia 09/08/2015   Essential hypertension 09/08/2015   Anxiety and depression 09/08/2015   GERD (gastroesophageal reflux disease) 09/08/2015    Immunization History  Administered Date(s) Administered   Fluad Quad(high Dose 65+) 08/09/2020   Influenza, Quadrivalent, Recombinant, Inj, Pf 07/04/2019   Influenza,inj,Quad PF,6+ Mos 06/28/2016, 07/31/2017, 08/09/2018   Influenza-Unspecified 07/11/2019   PFIZER(Purple Top)SARS-COV-2 Vaccination 12/10/2019, 12/31/2019   Pneumococcal Conjugate-13 10/11/2016   Pneumococcal Polysaccharide-23 03/30/2020    Conditions to be addressed/monitored:  Hypertension, Hyperlipidemia, GERD, COPD, Depression, Anxiety, and Osteoporosis  Care Plan : [place]  Updates made by Charlton Haws, [place] since 11/03/2021 12:00 AM     Problem: Hypertension, Hyperlipidemia, GERD, COPD, Depression, Anxiety, and Osteoporosis      Long-Range Goal: Disease mgmt   Start Date: 11/03/2021  Expected End Date: 11/03/2022  This Visit's Progress: On track  Priority: High  Note:   Current Barriers:  Unable to independently afford treatment regimen Unable to independently monitor therapeutic efficacy Suboptimal therapeutic regimen for depression  Pharmacist Clinical Goal(s):  Patient will verbalize ability to afford treatment regimen achieve adherence to monitoring guidelines and medication adherence  to achieve therapeutic efficacy adhere to plan to optimize therapeutic regimen for depression as evidenced by report of adherence to  recommended medication management changes through collaboration with PharmD and provider.   Interventions: 1:1 collaboration with [name], NP regarding development and update of comprehensive plan of care as evidenced by provider attestation and co-signature Inter-disciplinary care team collaboration (see longitudinal plan of care) Comprehensive medication review performed; medication list updated in electronic medical record  Hypertension (BP goal <140/90) -Controlled - BP was high recently in office; she is not checking at home; she endorses compliance with meds as prescribed -Current treatment: Hydralazine 50 mg - 1/2 tab AM and 1 tab PM - Appropriate, Effective, Safe, Accessible Irbesartan 300 mg daily -Appropriate, Effective, Safe, Accessible Metoprolol succinate 25 mg daily -Appropriate, Effective, Safe, Accessible -Current home readings: n/a -Current dietary habits: pinto beans, fried balogna sandwich; meet + 2 veggies -Current exercise habits: limited -Denies hypotensive/hypertensive symptoms -Educated on BP goals and benefits of medications for prevention of heart attack, stroke and kidney damage; Daily salt intake goal < 2300 mg; Importance of home blood pressure monitoring;  -Counseled to monitor BP at home daily, document, and provide log at future appointments -Recommended to continue current medication  Hyperlipidemia: (LDL goal < 70) -Controlled - LDL at goal; pt endorses compliance with statin -Hx aortic atherosclerosis -Current treatment: Simvastatin 40 mg daily PM -Appropriate, Effective, Safe, Accessible -Educated on Cholesterol goals; Benefits of statin for ASCVD risk reduction; -Recommended to continue current medication  COPD (Goal: control symptoms and prevent exacerbations) -Controlled - uses albuterol more in summer; pt  endorses compliance with Trelegy although it was expensive at the end of 2022 due to donut hole; she is interested in pursuing pt assistance but will not qualify for [employer] until she spends $600 on Rx's this year -Gold Grade: Gold 1 (FEV1>80%) -Current COPD Classification:  E -MMRC/CAT score: not on file -Pulmonary function testing: 02/24/20 - FEV1 83% predicted, ratio 0.68 -Exacerbations requiring treatment in last 6 months: 1 -Current treatment  Trelegy 100-62.5-25 mcg/act 1 puff daily -Appropriate, Effective, Safe, Query Accessible Albuterol HFA prn -Medications previously tried: n/a  -Patient reports consistent use of maintenance inhaler -Frequency of rescue inhaler use: infrequent -Counseled on Proper inhaler technique; Benefits of consistent maintenance inhaler use -Assessed pt finances - she will not qualify for Trelegy PAP right away, but would likely qualify for Breztri PAP  -Recommend trial of Breztri (30-day trial for efficacy, pursue PAP if effective/tolerated) - consulting with pulmonary  Depression/Anxiety (Goal: manage symptoms) -Not ideally controlled - pt thinks venlafaxine is not working as well as it used to, she feels more down lately -pt endorses poor sleep; wakes up 8am, naps 11am, 1 pm. Goes to bed 7pm -PHQ9: 0 (05/2021) - no/minimal depression -GAD7: 15 (04/2017) - severe anxiety -Current treatment: Venlafaxine ER 75 mg daily -Appropriate, Query Effective -Medications previously tried/failed: citalopram -Connected with PCP for mental health support -Educated on Benefits of medication for symptom control -Recommend increasing venlafaxine to 2 tablets (150 mg/day)  Osteoporosis (Goal prevent fractures) -Not ideally controlled - pt has stopped Prolia due to osteonecrosis; she is seeing dentistry; she has repeat DEXA scan scheduled for February -Last DEXA Scan: 12/24/19   T-Score total hip: -3.1  T-Score forearm radius: -2.5 -Patient is a candidate for pharmacologic  treatment due to T-Score < -2.5 in femoral neck -Current treatment  Vitamin D 5000 IU -Appropriate, Effective, Safe, Accessible Calcium 500 mg -Appropriate, Effective, Safe, Accessible -Medications previously tried: Prolia  -Recommend [id number] units of vitamin D daily. Recommend 1200 mg of calcium daily from dietary and supplemental sources. -Recommended to continue  current medication  Tobacco use (Goal: smoking ) -Not ideally controlled -pt is working on smoking cessation; she is down to 1/2 ppd; her husband still smokes -1 ppd x 56 pack-years -Previous quit attempts: 09/2018 (vaping 04/2019); has tried Chantix, NRT -Discussed importance of smoking cessation especially given recent osteonecrosis of the jaw; offered re-trial of Chantix, NRT but pt deferred for now; she will continue to try to cut back on cigarettes  GERD (Goal: manage symptoms) -Controlled - per pt report -Current treatment  Omeprazole 20 mg daily -Appropriate, Effective, Query Safe Tums -Appropriate, Effective, Safe, Accessible -Medications previously tried: famotidine  -Discussed effect of chronic PPI use on bone density - ideally she should come off PPI -Recommend trial off PPI - taper down to every-other-day dosing for 1-2 weeks then stop; may compensate with OTC Pepcid PRN  Constipation (Goal: manage symptoms) -Not ideally controlled - pt reports constipation is a "serious problem"; she is not using Miralax but takes Dulcolax about every 3 days -Current treatment  Dulcolax q3 days - Appropriate, Effective, Safe, Accessible Miralax PRN - not taking -Discussed fiber intake and hydration to improve bowel movements -Advised to use Miralax every day  Health Maintenance -Vaccine gaps: Shingrix, Flu -Current therapy:  Multivitamin -Patient is satisfied with current therapy and denies issues -Recommended to continue current medication  Patient Goals/Self-Care Activities Patient will:  - take medications as  prescribed as evidenced by patient report and record review focus on medication adherence by pill box check blood pressure periodically, document, and provide at future appointments collaborate with provider on medication access solutions -Try increasing venlafaxine to 2 tablets daily -Reduce omeprazole to every-other-day for 1-2 weeks, then stop. May use OTC Pepcid in its place. If reflux is much worse, may restart omeprazole. -Take Miralax every day. Ensure adequate water and fiber intake. -Continue to cut back on cigarettes.        Medication Assistance:  Trelegy - pt has to spend $600 on Rx's in 2023 before she will qualify for [employer] PAP  Compliance/Adherence/Medication fill history: Care Gaps: None  Star-Rating Drugs: Simvastatin - LF 08/29/21 x 90 ds; PDC unknown Irbesartan - LF 09/04/21 x 90 ds; [place] 66%  Patient's preferred pharmacy is:  [id number] [place] [place] [place]Phone: [phone]: [phone] Uses pill box? Yes Pt endorses 100% compliance  We discussed: Current pharmacy is preferred with insurance plan and patient is satisfied with pharmacy services Patient decided to: Continue current medication management strategy  Care Plan and Follow Up Patient Decision:  Patient agrees to Care Plan and Follow-up.  Plan: Telephone follow up appointment with care management team member scheduled for:  1 month  LCharlene Brooke PharmD, BCorvallis Clinic Pc Dba The [facility] Surgery CenterClinical Pharmacist LSandersPrimary Care at SWart[facility] [phone]

## 2021-11-01 ENCOUNTER — Other Ambulatory Visit: Payer: Self-pay

## 2021-11-01 ENCOUNTER — Ambulatory Visit (INDEPENDENT_AMBULATORY_CARE_PROVIDER_SITE_OTHER): Payer: PPO | Admitting: Pharmacist

## 2021-11-01 DIAGNOSIS — K219 Gastro-esophageal reflux disease without esophagitis: Secondary | ICD-10-CM

## 2021-11-01 DIAGNOSIS — Z720 Tobacco use: Secondary | ICD-10-CM

## 2021-11-01 DIAGNOSIS — J449 Chronic obstructive pulmonary disease, unspecified: Secondary | ICD-10-CM

## 2021-11-01 DIAGNOSIS — F419 Anxiety disorder, unspecified: Secondary | ICD-10-CM

## 2021-11-01 DIAGNOSIS — E785 Hyperlipidemia, unspecified: Secondary | ICD-10-CM

## 2021-11-01 DIAGNOSIS — I10 Essential (primary) hypertension: Secondary | ICD-10-CM

## 2021-11-01 DIAGNOSIS — I700 Atherosclerosis of aorta: Secondary | ICD-10-CM

## 2021-11-01 DIAGNOSIS — M810 Age-related osteoporosis without current pathological fracture: Secondary | ICD-10-CM

## 2021-11-01 DIAGNOSIS — F32A Depression, unspecified: Secondary | ICD-10-CM

## 2021-11-03 NOTE — Patient Instructions (Signed)
Visit Information  Phone number for Pharmacist: [phone]  Thank you for meeting with me to discuss your medications! I look forward to working with you to achieve your health care goals. Below is a summary of what we talked about during the visit:   Goals Addressed             This Visit's Progress    Manage My Medicine       Timeframe:  Long-Range Goal Priority:  Medium Start Date:     11/03/21                        Expected End Date: 11/03/22                      Follow Up Date Feb 2023   - call for medicine refill 2 or 3 days before it runs out - call if I am sick and can't take my medicine - keep a list of all the medicines I take; vitamins and herbals too - use a pillbox to sort medicine    Why is this important?   These steps will help you keep on track with your medicines.   Notes:         Care Plan : [place]  Updates made by Charlton Haws, RPH since 11/03/2021 12:00 AM     Problem: Hypertension, Hyperlipidemia, GERD, COPD, Depression, Anxiety, and Osteoporosis      Long-Range Goal: Disease mgmt   Start Date: 11/03/2021  Expected End Date: 11/03/2022  This Visit's Progress: On track  Priority: High  Note:   Current Barriers:  Unable to independently afford treatment regimen Unable to independently monitor therapeutic efficacy Suboptimal therapeutic regimen for depression  Pharmacist Clinical Goal(s):  Patient will verbalize ability to afford treatment regimen achieve adherence to monitoring guidelines and medication adherence to achieve therapeutic efficacy adhere to plan to optimize therapeutic regimen for depression as evidenced by report of adherence to recommended medication management changes through collaboration with PharmD and provider.   Interventions: 1:1 collaboration with [name], NP regarding development and update of comprehensive plan of care as evidenced by provider attestation and  co-signature Inter-disciplinary care team collaboration (see longitudinal plan of care) Comprehensive medication review performed; medication list updated in electronic medical record  Hypertension (BP goal <140/90) -Controlled - BP was high recently in office; she is not checking at home; she endorses compliance with meds as prescribed -Current treatment: Hydralazine 50 mg - 1/2 tab AM and 1 tab PM - Appropriate, Effective, Safe, Accessible Irbesartan 300 mg daily -Appropriate, Effective, Safe, Accessible Metoprolol succinate 25 mg daily -Appropriate, Effective, Safe, Accessible -Current home readings: n/a -Current dietary habits: pinto beans, fried balogna sandwich; meet + 2 veggies -Current exercise habits: limited -Denies hypotensive/hypertensive symptoms -Educated on BP goals and benefits of medications for prevention of heart attack, stroke and kidney damage; Daily salt intake goal < 2300 mg; Importance of home blood pressure monitoring;  -Counseled to monitor BP at home daily, document, and provide log at future appointments -Recommended to continue current medication  Hyperlipidemia: (LDL goal < 70) -Controlled - LDL at goal; pt endorses compliance with statin -Hx aortic atherosclerosis -Current treatment: Simvastatin 40 mg daily PM -Appropriate, Effective, Safe, Accessible -Educated on Cholesterol goals; Benefits of statin for ASCVD risk reduction; -Recommended to continue current medication  COPD (Goal: control symptoms and prevent exacerbations) -Controlled - uses albuterol more in summer; pt endorses compliance  with Trelegy although it was expensive at the end of 2022 due to donut hole; she is interested in pursuing pt assistance but will not qualify for [employer] until she spends $600 on Rx's this year -Gold Grade: Gold 1 (FEV1>80%) -Current COPD Classification:  E -MMRC/CAT score: not on file -Pulmonary function testing: 02/24/20 - FEV1 83% predicted, ratio 0.68 -Exacerbations  requiring treatment in last 6 months: 1 -Current treatment  Trelegy 100-62.5-25 mcg/act 1 puff daily -Appropriate, Effective, Safe, Query Accessible Albuterol HFA prn -Medications previously tried: n/a  -Patient reports consistent use of maintenance inhaler -Frequency of rescue inhaler use: infrequent -Counseled on Proper inhaler technique; Benefits of consistent maintenance inhaler use -Assessed pt finances - she will not qualify for Trelegy PAP right away, but would likely qualify for Breztri PAP  -Recommend trial of Breztri (30-day trial for efficacy, pursue PAP if effective/tolerated) - consulting with pulmonary  Depression/Anxiety (Goal: manage symptoms) -Not ideally controlled - pt thinks venlafaxine is not working as well as it used to, she feels more down lately -pt endorses poor sleep; wakes up 8am, naps 11am, 1 pm. 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 Recommend 1200 mg of calcium daily from dietary and supplemental sources. -Recommended to continue current  medication  Tobacco use (Goal: smoking ) -Not ideally controlled -pt is working on smoking cessation; she is down to 1/2 ppd; her husband still smokes -1 ppd x 56 pack-years -Previous quit attempts: 09/2018 (vaping 04/2019); has tried Chantix, NRT -Discussed importance of smoking cessation especially given recent osteonecrosis of the jaw; offered re-trial of Chantix, NRT but pt deferred for now; she will continue to try to cut back on cigarettes  GERD (Goal: manage symptoms) -Controlled - per pt report -Current treatment  Omeprazole 20 mg daily -Appropriate, Effective, Query Safe Tums -Appropriate, Effective, Safe, Accessible -Medications previously tried: famotidine  -Discussed effect of chronic PPI use on bone density - ideally she should come off PPI -Recommend trial off PPI - taper down to every-other-day dosing for 1-2 weeks then stop; may compensate with OTC Pepcid PRN  Constipation (Goal: manage symptoms) -Not ideally controlled - pt reports constipation is a "serious problem"; she is not using Miralax but takes Dulcolax about every 3 days -Current treatment  Dulcolax q3 days - Appropriate, Effective, Safe, Accessible Miralax PRN - not taking -Discussed fiber intake and hydration to improve bowel movements -Advised to use Miralax every day  Health Maintenance -Vaccine gaps: Shingrix, Flu -Current therapy:  Multivitamin -Patient is satisfied with current therapy and denies issues -Recommended to continue current medication  Patient Goals/Self-Care Activities Patient will:  - take medications as prescribed as evidenced by patient report and record review focus on medication adherence by pill box check  blood pressure periodically, document, and provide at future appointments collaborate with provider on medication access solutions -Try increasing venlafaxine to 2 tablets daily -Reduce omeprazole to every-other-day for 1-2 weeks, then stop. May use OTC Pepcid in its place. If  reflux is much worse, may restart omeprazole. -Take Miralax every day. Ensure adequate water and fiber intake. -Continue to cut back on cigarettes.        Ms. Kaman was given information about Chronic Care Management services today including:  CCM service includes personalized support from designated clinical staff supervised by her physician, including individualized plan of care and coordination with other care providers 24/7 contact phone numbers for assistance for urgent and routine care needs. Standard insurance, coinsurance, copays and deductibles apply for chronic care management only during months in which we provide at least 20 minutes of these services. Most insurances cover these services at 100%, however patients may be responsible for any copay, coinsurance and/or deductible if applicable. This service may help you avoid the need for more expensive face-to-face services. Only one practitioner may furnish and bill the service in a calendar month. The patient may stop CCM services at any time (effective at the end of the month) by phone call to the office staff.  Patient agreed to services and verbal consent obtained.   Patient verbalizes understanding of instructions and care plan provided today and agrees to view in [place]. Active MyChart status confirmed with patient.   Telephone follow up appointment with pharmacy team member scheduled for: 1 month  Charlene Brooke, PharmD, [facility] Clinical Pharmacist [facility] [phone]

## 2021-11-08 ENCOUNTER — Telehealth: Payer: Self-pay | Admitting: Pharmacist

## 2021-11-08 DIAGNOSIS — M810 Age-related osteoporosis without current pathological fracture: Secondary | ICD-10-CM | POA: Diagnosis not present

## 2021-11-08 DIAGNOSIS — E785 Hyperlipidemia, unspecified: Secondary | ICD-10-CM | POA: Diagnosis not present

## 2021-11-08 DIAGNOSIS — J449 Chronic obstructive pulmonary disease, unspecified: Secondary | ICD-10-CM

## 2021-11-08 DIAGNOSIS — F419 Anxiety disorder, unspecified: Secondary | ICD-10-CM

## 2021-11-08 DIAGNOSIS — I10 Essential (primary) hypertension: Secondary | ICD-10-CM | POA: Diagnosis not present

## 2021-11-08 DIAGNOSIS — F32A Depression, unspecified: Secondary | ICD-10-CM

## 2021-11-08 MED ORDER — BREZTRI AEROSPHERE 160-9-4.8 MCG/ACT IN AERO: 2.0000 | INHALATION_SPRAY | Freq: Two times a day (BID) | RESPIRATORY_TRACT | 3 refills | Status: DC

## 2021-11-08 NOTE — Telephone Encounter (Signed)
Spoke with Dr Vaughan Browner, Pulmonology - ok with changing Trelegy to [facility] for cost savings. Spoke with patient and enrolled her in [place] via online portal. Sent Rx to [facility] mail order pharmacy to complete enrollment.  Also, advised pt to follow up with PCP regarding venlafaxine waning efficacy and dose change. Scheduled appt with PCP for 12/07/21.

## 2021-11-10 DIAGNOSIS — M810 Age-related osteoporosis without current pathological fracture: Secondary | ICD-10-CM | POA: Diagnosis not present

## 2021-11-10 DIAGNOSIS — Z780 Asymptomatic menopausal state: Secondary | ICD-10-CM | POA: Diagnosis not present

## 2021-11-10 DIAGNOSIS — M85851 Other specified disorders of bone density and structure, right thigh: Secondary | ICD-10-CM | POA: Diagnosis not present

## 2021-11-25 ENCOUNTER — Other Ambulatory Visit: Payer: Self-pay

## 2021-11-25 ENCOUNTER — Inpatient Hospital Stay: Payer: PPO | Attending: Oncology

## 2021-11-25 DIAGNOSIS — M810 Age-related osteoporosis without current pathological fracture: Secondary | ICD-10-CM | POA: Diagnosis not present

## 2021-11-25 DIAGNOSIS — R779 Abnormality of plasma protein, unspecified: Secondary | ICD-10-CM | POA: Diagnosis not present

## 2021-11-25 DIAGNOSIS — D472 Monoclonal gammopathy: Secondary | ICD-10-CM

## 2021-11-25 DIAGNOSIS — E559 Vitamin D deficiency, unspecified: Secondary | ICD-10-CM | POA: Diagnosis not present

## 2021-11-25 DIAGNOSIS — R5383 Other fatigue: Secondary | ICD-10-CM | POA: Diagnosis not present

## 2021-11-25 DIAGNOSIS — G8929 Other chronic pain: Secondary | ICD-10-CM | POA: Diagnosis not present

## 2021-11-25 LAB — CMP (CANCER CENTER ONLY)
ALT: 10 U/L (ref 0–44)
AST: 19 U/L (ref 15–41)
Albumin: 4.1 g/dL (ref 3.5–5.0)
Alkaline Phosphatase: 66 U/L (ref 38–126)
Anion gap: 8 (ref 5–15)
BUN: 14 mg/dL (ref 8–23)
CO2: 29 mmol/L (ref 22–32)
Calcium: 10.3 mg/dL (ref 8.9–10.3)
Chloride: 102 mmol/L (ref 98–111)
Creatinine: 1.09 mg/dL — ABNORMAL HIGH (ref 0.44–1.00)
GFR, Estimated: 53 mL/min — ABNORMAL LOW (ref >60)
Glucose, Bld: 82 mg/dL (ref 70–99)
Potassium: 3.7 mmol/L (ref 3.5–5.1)
Sodium: 139 mmol/L (ref 135–145)
Total Bilirubin: 0.4 mg/dL (ref 0.3–1.2)
Total Protein: 6.6 g/dL (ref 6.5–8.1)

## 2021-11-25 LAB — CBC WITH DIFFERENTIAL/PLATELET
Abs Immature Granulocytes: 0.01 10*3/uL (ref 0.00–0.07)
Basophils Absolute: 0.1 10*3/uL (ref 0.0–0.1)
Basophils Relative: 1 %
Eosinophils Absolute: 0.2 10*3/uL (ref 0.0–0.5)
Eosinophils Relative: 2 %
HCT: 38.5 % (ref 36.0–46.0)
Hemoglobin: 12.9 g/dL (ref 12.0–15.0)
Immature Granulocytes: 0 %
Lymphocytes Relative: 30 %
Lymphs Abs: 2.3 10*3/uL (ref 0.7–4.0)
MCH: 32 pg (ref 26.0–34.0)
MCHC: 33.5 g/dL (ref 30.0–36.0)
MCV: 95.5 fL (ref 80.0–100.0)
Monocytes Absolute: 1 10*3/uL (ref 0.1–1.0)
Monocytes Relative: 13 %
Neutro Abs: 4.2 10*3/uL (ref 1.7–7.7)
Neutrophils Relative %: 54 %
Platelets: 349 10*3/uL (ref 150–400)
RBC: 4.03 MIL/uL (ref 3.87–5.11)
RDW: 12.9 % (ref 11.5–15.5)
WBC: 7.8 10*3/uL (ref 4.0–10.5)
nRBC: 0 % (ref 0.0–0.2)

## 2021-11-28 LAB — KAPPA/LAMBDA LIGHT CHAINS
Kappa free light chain: 27.8 mg/L — ABNORMAL HIGH (ref 3.3–19.4)
Kappa, lambda light chain ratio: 1.84 — ABNORMAL HIGH (ref 0.26–1.65)
Lambda free light chains: 15.1 mg/L (ref 5.7–26.3)

## 2021-11-29 LAB — MULTIPLE MYELOMA PANEL, SERUM
Albumin SerPl Elph-Mcnc: 3.8 g/dL (ref 2.9–4.4)
Albumin/Glob SerPl: 1.6 (ref 0.7–1.7)
Alpha 1: 0.3 g/dL (ref 0.0–0.4)
Alpha2 Glob SerPl Elph-Mcnc: 0.7 g/dL (ref 0.4–1.0)
B-Globulin SerPl Elph-Mcnc: 0.9 g/dL (ref 0.7–1.3)
Gamma Glob SerPl Elph-Mcnc: 0.6 g/dL (ref 0.4–1.8)
Globulin, Total: 2.5 g/dL (ref 2.2–3.9)
IgA: 180 mg/dL (ref 64–422)
IgG (Immunoglobin G), Serum: 632 mg/dL (ref 586–1602)
IgM (Immunoglobulin M), Srm: 62 mg/dL (ref 26–217)
Total Protein ELP: 6.3 g/dL (ref 6.0–8.5)

## 2021-12-01 ENCOUNTER — Telehealth: Payer: PPO

## 2021-12-02 ENCOUNTER — Other Ambulatory Visit: Payer: Self-pay

## 2021-12-02 ENCOUNTER — Inpatient Hospital Stay (HOSPITAL_BASED_OUTPATIENT_CLINIC_OR_DEPARTMENT_OTHER): Payer: PPO | Admitting: Oncology

## 2021-12-02 VITALS — BP 121/66 | HR 88 | Temp 97.9°F | Resp 18 | Ht 61.0 in | Wt 143.9 lb

## 2021-12-02 DIAGNOSIS — D472 Monoclonal gammopathy: Secondary | ICD-10-CM | POA: Diagnosis not present

## 2021-12-02 DIAGNOSIS — R779 Abnormality of plasma protein, unspecified: Secondary | ICD-10-CM | POA: Diagnosis not present

## 2021-12-02 NOTE — Progress Notes (Signed)
Hematology and Oncology Follow Up Visit  [name] [id number] 1946/08/27 76 y.o. 12/02/2021 3:47 PM Morgan [name] [name], NPClark, [name], NP   Principle Diagnosis: 76 year old woman with abnormal protein studies detected in 2022.  She was found to have an M spike on immunofixation.  She has elevated kappa free light chain with increased kappa to lambda ratio documented in February 2023.    Current therapy: Active surveillance.  Interim History: Morgan [name] returns today for a follow-up visit.  Since her last visit, she reports feeling well without any major complaints.  She continues to have chronic back pain related to osteoporosis and compression fractures.  She had denied any recent hospitalizations or illnesses.  She denied any recent falls or syncope.    Medications: I have reviewed the patient's current medications.  Current Outpatient Medications  Medication Sig Dispense Refill   albuterol (VENTOLIN HFA) 108 (90 Base) MCG/ACT inhaler Inhale 2 puffs into the lungs every 4 (four) hours as needed for wheezing or shortness of breath. 3 each 3   bisacodyl (DULCOLAX) 5 MG EC tablet Take 5 mg by mouth daily as needed for moderate constipation.     Budeson-Glycopyrrol-Formoterol (BREZTRI AEROSPHERE) 160-9-4.8 MCG/ACT AERO Inhale 2 puffs into the lungs 2 (two) times daily. 32.1 g 3   Cholecalciferol 125 MCG (5000 UT) capsule Take 5,000 Units by mouth daily.     hydrALAZINE (APRESOLINE) 50 MG tablet TAKE 1/2 TABLET EVERY MORNING AND 1 FULL TABLET IN THE EVENING FOR BLOOD PRESSURE 135 tablet 3   irbesartan (AVAPRO) 300 MG tablet Take 1 tablet (300 mg total) by mouth daily. For blood pressure. 90 tablet 3   metoprolol succinate (TOPROL-XL) 25 MG 24 hr tablet Take 1 tablet (25 mg total) by mouth daily. For blood pressure. 90 tablet 3   Multiple Vitamins-Minerals (MULTIVITAMIN GUMMIES ADULTS) CHEW Chew 2 tablets by mouth daily.      omeprazole (PRILOSEC) 20 MG capsule Take 1 capsule by  mouth every day for heartburn 90 capsule 3   polyethylene glycol powder (GLYCOLAX/MIRALAX) powder Mix 17 g into water once to twice daily as needed for constipation. (Patient not taking: Reported on 11/01/2021) 3350 g 0   simvastatin (ZOCOR) 40 MG tablet Take 1 tablet (40 mg total) by mouth every evening. For cholesterol. 90 tablet 3   venlafaxine XR (EFFEXOR-XR) 75 MG 24 hr capsule Take 1 capsule (75 mg total) by mouth daily with breakfast. For anxiety/depression. 90 capsule 3   No current facility-administered medications for this visit.     Allergies:  Allergies  Allergen Reactions   Hctz [Hydrochlorothiazide] Other (See Comments)    Hyponatremia    Amlodipine Swelling    Ankle/leg swelling   Azithromycin Nausea And Vomiting   Codeine Itching    Makes me crazy      Physical Exam: Blood pressure 121/66, pulse 88, temperature 97.9 F (36.6 C), temperature source Temporal, resp. rate 18, height $RemoveBe'5\' 1"'ZMHdfwNcX$  (1.549 m), weight 143 lb 14.4 oz (65.3 kg), SpO2 98 %. ECOG: 1 General appearance: alert and cooperative appeared without distress. Head: Normocephalic, without obvious abnormality Oropharynx: No oral thrush or ulcers. Eyes: No scleral icterus.  Pupils are equal and round reactive to light. Lymph nodes: Cervical, supraclavicular, and axillary nodes normal. Heart:regular rate and rhythm, S1, S2 normal, no murmur, click, rub or gallop Lung:chest clear, no wheezing, rales, normal symmetric air entry Abdomin: soft, non-tender, without masses or organomegaly. Neurological: No motor, sensory deficits.  Intact deep tendon reflexes. Skin: No  rashes or lesions.  No ecchymosis or petechiae. Musculoskeletal: No joint deformity or effusion. Psychiatric: Mood and affect are appropriate.    Lab Results: Lab Results  Component Value Date   WBC 7.8 11/25/2021   HGB 12.9 11/25/2021   HCT 38.5 11/25/2021   MCV 95.5 11/25/2021   PLT 349 11/25/2021     Chemistry      Component Value  Date/Time   NA 139 11/25/2021 1344   K 3.7 11/25/2021 1344   CL 102 11/25/2021 1344   CO2 29 11/25/2021 1344   BUN 14 11/25/2021 1344   CREATININE 1.09 (H) 11/25/2021 1344      Component Value Date/Time   CALCIUM 10.3 11/25/2021 1344   ALKPHOS 66 11/25/2021 1344   AST 19 11/25/2021 1344   ALT 10 11/25/2021 1344   BILITOT 0.4 11/25/2021 1344         Impression and Plan:   76 year old with:  1.  Abnormal serum protein electrophoresis detected in June 2022 and confirmed in February 2023.  She was found to have elevated kappa free light chains with increased kappa to lambda ratio of 1.84.  Laboratory data obtained on November 25, 2021 were personally reviewed and discussed with the patient.  He does not have anemia M spike with immunofixation is normal.  She has also normal quantitative immunoglobulins.  She has normal CBC and electrolytes.  Her creatinine slightly elevated with a creatinine clearance of 53 cc/min.  She does have mild elevation in her kappa free light chain.  The differential diagnosis of these findings were reviewed at this time.  This likely represents reactive findings versus MGUS.  It is unlikely we are dealing with multiple myeloma, amyloidosis or symptomatic plasma cell disorder.  At this time, I have recommended active surveillance and repeat this testing in 1 year.  If she has evidence of MGUS, we will continue annual surveillance.  2.  Follow-up in 1 year for repeat follow-up.  30  minutes were spent on this encounter.  The time was dedicated to reviewing laboratory data, differential diagnosis discussion, treatment choices and future plan of care reviewed.    Zola Button, MD 2/24/20233:47 PM

## 2021-12-07 ENCOUNTER — Ambulatory Visit: Payer: PPO | Admitting: Primary Care

## 2021-12-13 ENCOUNTER — Other Ambulatory Visit: Payer: Self-pay

## 2021-12-13 ENCOUNTER — Ambulatory Visit (INDEPENDENT_AMBULATORY_CARE_PROVIDER_SITE_OTHER): Payer: PPO | Admitting: Primary Care

## 2021-12-13 ENCOUNTER — Encounter: Payer: Self-pay | Admitting: Primary Care

## 2021-12-13 VITALS — BP 126/64 | HR 86 | Temp 98.3°F | Ht 61.0 in | Wt 141.0 lb

## 2021-12-13 DIAGNOSIS — F419 Anxiety disorder, unspecified: Secondary | ICD-10-CM | POA: Diagnosis not present

## 2021-12-13 DIAGNOSIS — F32A Depression, unspecified: Secondary | ICD-10-CM | POA: Diagnosis not present

## 2021-12-13 MED ORDER — VENLAFAXINE HCL ER 150 MG PO CP24: 150.0000 mg | ORAL_CAPSULE | Freq: Every day | ORAL | 1 refills | Status: DC

## 2021-12-13 NOTE — Patient Instructions (Addendum)
We increased the dose of your venlafaxine ER to 150 mg.  I sent a new prescription to your pharmacy.  Take 1 capsule by mouth once daily. ? ?Set up your physical for mid August 2023. ? ?It was a pleasure to see you today! ? ? ?
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
## 2021-12-13 NOTE — Assessment & Plan Note (Signed)
Symptoms today representative of depression, discussed with patient today. ? ?Given that she did notice increased energy on the increased dose of venlafaxine, we will resume venlafaxine XR at 150 mg daily, she agrees.  ? ?We discussed that it may take 4 to 6 weeks to notice improvement. ? ?Prescription for venlafaxine ER 150 mg sent to pharmacy, discussed to take 1 capsule once daily for depression. She will update if no improvement. ?

## 2021-12-14 DIAGNOSIS — M810 Age-related osteoporosis without current pathological fracture: Secondary | ICD-10-CM | POA: Diagnosis not present

## 2021-12-14 DIAGNOSIS — E559 Vitamin D deficiency, unspecified: Secondary | ICD-10-CM | POA: Diagnosis not present

## 2021-12-14 DIAGNOSIS — M879 Osteonecrosis, unspecified: Secondary | ICD-10-CM | POA: Diagnosis not present

## 2021-12-26 ENCOUNTER — Telehealth: Payer: Self-pay | Admitting: Acute Care

## 2021-12-26 NOTE — Telephone Encounter (Signed)
Called to schedule annual LDCT. No answer and VM is full - unable to leave a message ?

## 2022-01-04 ENCOUNTER — Telehealth: Payer: Self-pay

## 2022-01-04 NOTE — Progress Notes (Signed)
? ? ?  Chronic Care Management ?Pharmacy Assistant  ? ?Name: Morgan [name] [facility]  MRN: [id number] DOB: [birth year] ? ?Reason for Encounter: CCM Counsellor) ?  ?Medications: ?Outpatient Encounter Medications as of 01/04/2022  ?Medication Sig  ? albuterol (VENTOLIN HFA) 108 (90 Base) MCG/ACT inhaler Inhale 2 puffs into the lungs every 4 (four) hours as needed for wheezing or shortness of breath.  ? bisacodyl (DULCOLAX) 5 MG EC tablet Take 5 mg by mouth daily as needed for moderate constipation.  ? Budeson-Glycopyrrol-Formoterol (BREZTRI AEROSPHERE) 160-9-4.8 MCG/ACT AERO Inhale 2 puffs into the lungs 2 (two) times daily.  ? Cholecalciferol 125 MCG (5000 UT) capsule Take 5,000 Units by mouth daily.  ? hydrALAZINE (APRESOLINE) 50 MG tablet TAKE 1/2 TABLET EVERY MORNING AND 1 FULL TABLET IN THE EVENING FOR BLOOD PRESSURE  ? irbesartan (AVAPRO) 300 MG tablet Take 1 tablet (300 mg total) by mouth daily. For blood pressure.  ? metoprolol succinate (TOPROL-XL) 25 MG 24 hr tablet Take 1 tablet (25 mg total) by mouth daily. For blood pressure.  ? Multiple Vitamins-Minerals (MULTIVITAMIN GUMMIES ADULTS) CHEW Chew 2 tablets by mouth daily.   ? omeprazole (PRILOSEC) 20 MG capsule Take 1 capsule by mouth every day for heartburn  ? polyethylene glycol powder (GLYCOLAX/MIRALAX) powder Mix 17 g into water once to twice daily as needed for constipation.  ? simvastatin (ZOCOR) 40 MG tablet Take 1 tablet (40 mg total) by mouth every evening. For cholesterol.  ? venlafaxine XR (EFFEXOR-XR) 150 MG 24 hr capsule Take 1 capsule (150 mg total) by mouth daily with breakfast. For anxiety and depression  ? ?No facility-administered encounter medications on file as of 01/04/2022.  ? ?[facility] was contacted to remind of upcoming telephone visit with Charlene Brooke on 01/11/2022 at 3:00. Patient was reminded to have any blood glucose and blood pressure readings available for review at appointment.  ? ?Patient confirmed  appointment. ? ?Are you having any problems with your medications? No  ? ?Do you have any concerns you like to discuss with the pharmacist? No ? ?Star Rating Drugs: ?Medication:  Last Fill: Day Supply ?Irbesartan 300 mg 09/04/2021 90 ?Simvastatin 40 mg 08/29/2021 90 ?Fill dates verified with [facility] ? ?Charlene Brooke, CPP notified ? ?Marijean Niemann, RMA ?Clinical Pharmacy Assistant ?[phone] ? ? ? ? ? ? ?

## 2022-01-11 ENCOUNTER — Telehealth: Payer: PPO

## 2022-02-08 ENCOUNTER — Telehealth: Payer: Self-pay

## 2022-02-08 ENCOUNTER — Ambulatory Visit (INDEPENDENT_AMBULATORY_CARE_PROVIDER_SITE_OTHER): Payer: PPO

## 2022-02-08 VITALS — Ht 61.0 in | Wt 136.0 lb

## 2022-02-08 DIAGNOSIS — F17200 Nicotine dependence, unspecified, uncomplicated: Secondary | ICD-10-CM

## 2022-02-08 DIAGNOSIS — Z0000 Encounter for general adult medical examination without abnormal findings: Secondary | ICD-10-CM | POA: Diagnosis not present

## 2022-02-08 DIAGNOSIS — J069 Acute upper respiratory infection, unspecified: Secondary | ICD-10-CM

## 2022-02-08 DIAGNOSIS — J441 Chronic obstructive pulmonary disease with (acute) exacerbation: Secondary | ICD-10-CM

## 2022-02-08 MED ORDER — ALBUTEROL SULFATE HFA 108 (90 BASE) MCG/ACT IN AERS: 2.0000 | INHALATION_SPRAY | RESPIRATORY_TRACT | 0 refills | Status: DC | PRN

## 2022-02-08 NOTE — Addendum Note (Signed)
Addended by: [name] on: 02/08/2022 04:21 PM ? ? Modules accepted: Orders ? ?

## 2022-02-08 NOTE — Patient Instructions (Addendum)
Ms. Imber , ?Thank you for taking time to come for your Medicare Wellness Visit. I appreciate your ongoing commitment to your health goals. Please review the following plan we discussed and let me know if I can assist you in the future.  ? ?Screening recommendations/referrals: ?Colonoscopy: Cologuard done 05/03/2020 Repeat every 3 years.  ?Mammogram: Done 07/18/2021 Repeat annually ? ?Bone Density: Done 12/24/2019. Check with Dr. Layne Benton to schedule.  ? ?Recommended yearly ophthalmology/optometry visit for glaucoma screening and checkup ?Recommended yearly dental visit for hygiene and checkup ? ?Vaccinations: ?Influenza vaccine: Due Fall 2023. ?Pneumococcal vaccine: Done 10/21/2016 and 03/30/2020 ?Tdap vaccine: Due Repeat in 10 years ? ?Shingles vaccine: Discussed.    ?Covid-19:Declined. ? ?Advanced directives: Advance directive discussed with you today. Even though you declined this today, please call our office should you change your mind, and we can give you the proper paperwork for you to fill out. ? ? ?Conditions/risks identified: Aim for 30 minutes of exercise or walking, 6-8 glasses of water, and 5 servings of fruits and vegetables each day. ? ? ?Next appointment: Follow up in one year for your annual wellness visit 02/12/2023 @ 10:15 AM. ? ? ?Preventive Care 60 Years and Older, Female ?Preventive care refers to lifestyle choices and visits with your health care provider that can promote health and wellness. ?What does preventive care include? ?A yearly physical exam. This is also called an annual well check. ?Dental exams once or twice a year. ?Routine eye exams. Ask your health care provider how often you should have your eyes checked. ?Personal lifestyle choices, including: ?Daily care of your teeth and gums. ?Regular physical activity. ?Eating a healthy diet. ?Avoiding tobacco and drug use. ?Limiting alcohol use. ?Practicing safe sex. ?Taking low-dose aspirin every day. ?Taking vitamin and mineral supplements  as recommended by your health care provider. ?What happens during an annual well check? ?The services and screenings done by your health care provider during your annual well check will depend on your age, overall health, lifestyle risk factors, and family history of disease. ?Counseling  ?Your health care provider may ask you questions about your: ?Alcohol use. ?Tobacco use. ?Drug use. ?Emotional well-being. ?Home and relationship well-being. ?Sexual activity. ?Eating habits. ?History of falls. ?Memory and ability to understand (cognition). ?Work and work Statistician. ?Reproductive health. ?Screening  ?You may have the following tests or measurements: ?Height, weight, and BMI. ?Blood pressure. ?Lipid and cholesterol levels. These may be checked every 5 years, or more frequently if you are over 73 years old. ?Skin check. ?Lung cancer screening. You may have this screening every year starting at age 46 if you have a 30-pack-year history of smoking and currently smoke or have quit within the past 15 years. ?Fecal occult blood test (FOBT) of the stool. You may have this test every year starting at age 22. ?Flexible sigmoidoscopy or colonoscopy. You may have a sigmoidoscopy every 5 years or a colonoscopy every 10 years starting at age 73. ?Hepatitis C blood test. ?Hepatitis B blood test. ?Sexually transmitted disease (STD) testing. ?Diabetes screening. This is done by checking your blood sugar (glucose) after you have not eaten for a while (fasting). You may have this done every 1-3 years. ?Bone density scan. This is done to screen for osteoporosis. You may have this done starting at age 2. ?Mammogram. This may be done every 1-2 years. Talk to your health care provider about how often you should have regular mammograms. ?Talk with your health care provider about your test results, treatment  options, and if necessary, the need for more tests. ?Vaccines  ?Your health care provider may recommend certain vaccines, such  as: ?Influenza vaccine. This is recommended every year. ?Tetanus, diphtheria, and acellular pertussis (Tdap, Td) vaccine. You may need a Td booster every 10 years. ?Zoster vaccine. You may need this after age 78. ?Pneumococcal 13-valent conjugate (PCV13) vaccine. One dose is recommended after age 69. ?Pneumococcal polysaccharide (PPSV23) vaccine. One dose is recommended after age 41. ?Talk to your health care provider about which screenings and vaccines you need and how often you need them. ?This information is not intended to replace advice given to you by your health care provider. Make sure you discuss any questions you have with your health care provider. ?Document Released: 10/22/2015 Document Revised: 06/14/2016 Document Reviewed: 07/27/2015 ?Elsevier Interactive Patient Education ? 2017 [place]. ? ?Fall Prevention in the Home ?Falls can cause injuries. They can happen to people of all ages. There are many things you can do to make your home safe and to help prevent falls. ?What can I do on the outside of my home? ?Regularly fix the edges of walkways and driveways and fix any cracks. ?Remove anything that might make you trip as you walk through a door, such as a raised step or threshold. ?Trim any bushes or trees on the path to your home. ?Use bright outdoor lighting. ?Clear any walking paths of anything that might make someone trip, such as rocks or tools. ?Regularly check to see if handrails are loose or broken. Make sure that both sides of any steps have handrails. ?Any raised decks and porches should have guardrails on the edges. ?Have any leaves, snow, or ice cleared regularly. ?Use sand or salt on walking paths during winter. ?Clean up any spills in your garage right away. This includes oil or grease spills. ?What can I do in the bathroom? ?Use night lights. ?Install grab bars by the toilet and in the tub and shower. Do not use towel bars as grab bars. ?Use non-skid mats or decals in the tub or  shower. ?If you need to sit down in the shower, use a plastic, non-slip stool. ?Keep the floor dry. Clean up any water that spills on the floor as soon as it happens. ?Remove soap buildup in the tub or shower regularly. ?Attach bath mats securely with double-sided non-slip rug tape. ?Do not have throw rugs and other things on the floor that can make you trip. ?What can I do in the bedroom? ?Use night lights. ?Make sure that you have a light by your bed that is easy to reach. ?Do not use any sheets or blankets that are too big for your bed. They should not hang down onto the floor. ?Have a firm chair that has side arms. You can use this for support while you get dressed. ?Do not have throw rugs and other things on the floor that can make you trip. ?What can I do in the kitchen? ?Clean up any spills right away. ?Avoid walking on wet floors. ?Keep items that you use a lot in easy-to-reach places. ?If you need to reach something above you, use a strong step stool that has a grab bar. ?Keep electrical cords out of the way. ?Do not use floor polish or wax that makes floors slippery. If you must use wax, use non-skid floor wax. ?Do not have throw rugs and other things on the floor that can make you trip. ?What can I do with my stairs? ?Do not  leave any items on the stairs. ?Make sure that there are handrails on both sides of the stairs and use them. Fix handrails that are broken or loose. Make sure that handrails are as long as the stairways. ?Check any carpeting to make sure that it is firmly attached to the stairs. Fix any carpet that is loose or worn. ?Avoid having throw rugs at the top or bottom of the stairs. If you do have throw rugs, attach them to the floor with carpet tape. ?Make sure that you have a light switch at the top of the stairs and the bottom of the stairs. If you do not have them, ask someone to add them for you. ?What else can I do to help prevent falls? ?Wear shoes that: ?Do not have high heels. ?Have  rubber bottoms. ?Are comfortable and fit you well. ?Are closed at the toe. Do not wear sandals. ?If you use a stepladder: ?Make sure that it is fully opened. Do not climb a closed stepladder. ?Make sure tha
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
## 2022-02-08 NOTE — Telephone Encounter (Signed)
Albuterol inhaler sent to pharmacy.

## 2022-02-08 NOTE — Telephone Encounter (Signed)
At [facility], pt states she is out of her Albuterol inhaler and asks if a refill could be sent in, if possible? Thank you! ?

## 2022-02-13 ENCOUNTER — Other Ambulatory Visit: Payer: Self-pay | Admitting: Family Medicine

## 2022-02-13 DIAGNOSIS — J441 Chronic obstructive pulmonary disease with (acute) exacerbation: Secondary | ICD-10-CM

## 2022-02-13 DIAGNOSIS — J069 Acute upper respiratory infection, unspecified: Secondary | ICD-10-CM

## 2022-02-14 DIAGNOSIS — M4120 Other idiopathic scoliosis, site unspecified: Secondary | ICD-10-CM | POA: Diagnosis not present

## 2022-02-14 DIAGNOSIS — M545 Low back pain, unspecified: Secondary | ICD-10-CM | POA: Diagnosis not present

## 2022-02-14 DIAGNOSIS — M25551 Pain in right hip: Secondary | ICD-10-CM | POA: Diagnosis not present

## 2022-02-14 DIAGNOSIS — M810 Age-related osteoporosis without current pathological fracture: Secondary | ICD-10-CM | POA: Diagnosis not present

## 2022-03-21 NOTE — Telephone Encounter (Signed)
Attempted to call to schedule annual LDCT. No answer and VM full. Letter mailed to home to call for scheduling.

## 2022-03-22 DIAGNOSIS — M47816 Spondylosis without myelopathy or radiculopathy, lumbar region: Secondary | ICD-10-CM | POA: Diagnosis not present

## 2022-03-22 DIAGNOSIS — Z6826 Body mass index (BMI) 26.0-26.9, adult: Secondary | ICD-10-CM | POA: Diagnosis not present

## 2022-03-23 DIAGNOSIS — H43813 Vitreous degeneration, bilateral: Secondary | ICD-10-CM | POA: Diagnosis not present

## 2022-03-23 DIAGNOSIS — Z961 Presence of intraocular lens: Secondary | ICD-10-CM | POA: Diagnosis not present

## 2022-04-06 DIAGNOSIS — M5117 Intervertebral disc disorders with radiculopathy, lumbosacral region: Secondary | ICD-10-CM | POA: Diagnosis not present

## 2022-04-06 DIAGNOSIS — M5116 Intervertebral disc disorders with radiculopathy, lumbar region: Secondary | ICD-10-CM | POA: Diagnosis not present

## 2022-04-06 DIAGNOSIS — M5416 Radiculopathy, lumbar region: Secondary | ICD-10-CM | POA: Diagnosis not present

## 2022-04-19 ENCOUNTER — Telehealth: Payer: Self-pay | Admitting: Pulmonary Disease

## 2022-04-19 NOTE — Telephone Encounter (Signed)
Called patient and she states that she has her appointment with Dr Vaughan Browner on the 20th of this month. But she is wondering she she is having some issues with her breathing and some wheezing is noted if she could get a round of prednisone till she sees Dr Vaughan Browner. Patient denies fever or any other symptoms.  Dr Loanne Drilling please advise since Vaughan Browner is out of office.

## 2022-04-20 MED ORDER — PREDNISONE 10 MG PO TABS: 40.0000 mg | ORAL_TABLET | Freq: Every day | ORAL | 0 refills | Status: AC

## 2022-04-20 NOTE — Telephone Encounter (Signed)
Suspect COPD exacerbation. Prednisone 40 mg x 5 days ordered. Attempted to call patient. No answer and unable to leave VM due to full box.  Please contact patient again to notify her regarding prednisone order.

## 2022-04-21 NOTE — Telephone Encounter (Signed)
Called patient to inform her of the prednisone order. Patient verbalized understanding. Nothing further needed

## 2022-04-26 ENCOUNTER — Other Ambulatory Visit: Payer: Self-pay | Admitting: Primary Care

## 2022-04-26 DIAGNOSIS — J441 Chronic obstructive pulmonary disease with (acute) exacerbation: Secondary | ICD-10-CM

## 2022-04-26 DIAGNOSIS — J069 Acute upper respiratory infection, unspecified: Secondary | ICD-10-CM

## 2022-04-27 ENCOUNTER — Ambulatory Visit (INDEPENDENT_AMBULATORY_CARE_PROVIDER_SITE_OTHER): Payer: PPO

## 2022-04-27 ENCOUNTER — Encounter: Payer: Self-pay | Admitting: Pulmonary Disease

## 2022-04-27 ENCOUNTER — Ambulatory Visit: Payer: PPO | Admitting: Pulmonary Disease

## 2022-04-27 VITALS — BP 120/72 | HR 84 | Temp 98.3°F | Ht 61.0 in | Wt 132.6 lb

## 2022-04-27 DIAGNOSIS — J441 Chronic obstructive pulmonary disease with (acute) exacerbation: Secondary | ICD-10-CM | POA: Diagnosis not present

## 2022-04-27 DIAGNOSIS — R062 Wheezing: Secondary | ICD-10-CM | POA: Diagnosis not present

## 2022-04-27 DIAGNOSIS — Z720 Tobacco use: Secondary | ICD-10-CM | POA: Diagnosis not present

## 2022-04-27 DIAGNOSIS — R0602 Shortness of breath: Secondary | ICD-10-CM | POA: Diagnosis not present

## 2022-04-27 DIAGNOSIS — J449 Chronic obstructive pulmonary disease, unspecified: Secondary | ICD-10-CM

## 2022-04-27 DIAGNOSIS — R059 Cough, unspecified: Secondary | ICD-10-CM | POA: Diagnosis not present

## 2022-04-27 MED ORDER — METHYLPREDNISOLONE ACETATE 80 MG/ML IJ SUSP
80.0000 mg | Freq: Once | INTRAMUSCULAR | Status: AC
  Administered 2022-04-27: 80 mg via INTRAMUSCULAR

## 2022-04-27 MED ORDER — IPRATROPIUM-ALBUTEROL 0.5-2.5 (3) MG/3ML IN SOLN: 3.0000 mL | Freq: Four times a day (QID) | RESPIRATORY_TRACT | 11 refills | Status: AC | PRN

## 2022-04-27 MED ORDER — PREDNISONE 10 MG PO TABS: ORAL_TABLET | ORAL | 0 refills | Status: DC

## 2022-04-27 MED ORDER — DOXYCYCLINE HYCLATE 100 MG PO TABS: 100.0000 mg | ORAL_TABLET | Freq: Two times a day (BID) | ORAL | 0 refills | Status: DC

## 2022-04-27 NOTE — Patient Instructions (Signed)
We will give you a steroid injection and prednisone starting at 60 mg/day.  Reduce dose by 10 mg every 2 days Prescribe doxycycline 100 mg twice daily, get chest x-ray Order nebulizers and DuoNebs as needed We will refer you for low-dose screening CT of the chest  Follow-up in 6 months.

## 2022-04-27 NOTE — Progress Notes (Addendum)
Morgan [name] [facility]    [id number]    25-Jan-1946  Primary Care Physician:Morgan [name], Morgan Penna, Morgan [name]  Referring Physician: [name], Morgan [name] [place],  [place] [id number]  Chief complaint:  Follow-up for COPD  HPI: 76 y.o.  with history of COPD, GERD, hypertension, allergies, ex-smoker admitted in July 2020 for COPD exacerbation which is treated with steroids and nebulizer.  Follow-up with pulmonary in 2021 when she was switched from Advair to Trelegy  Pets: No pets Occupation: Retired Network engineer.  Used to work in [employer] when she was young Exposures: No known exposures, no mold, hot tub, Jacuzzi Smoking history: 50-75-pack-year smoker.  Quit in December 2019.  Resumed smoking in 2021 Travel history: No significant travel history Relevant family history: No significant family history of lung disease  Interim history: Continues on Trelegy inhaler, albuterol as needed Complains of increasing dyspnea for the past few weeks.  She got prednisone for 5 days on 7/13 with some improvement but continues to be symptomatic with cough, yellow-green mucus, dyspnea and wheezing  Outpatient Encounter Medications as of 04/27/2022  Medication Sig   albuterol (VENTOLIN HFA) 108 (90 Base) MCG/ACT inhaler INHALE 2 PUFFS INTO THE LUNGS EVERY 4 HOURS AS NEEDED FOR WHEEZING OR SHORTNESS OF BREATH.   Budeson-Glycopyrrol-Formoterol (BREZTRI AEROSPHERE) 160-9-4.8 MCG/ACT AERO Inhale 2 puffs into the lungs 2 (two) times daily.   calcitonin, salmon, (MIACALCIN/FORTICAL) 200 UNIT/ACT nasal spray 1 spray daily.   Cholecalciferol 125 MCG (5000 UT) capsule Take 5,000 Units by mouth daily.   hydrALAZINE (APRESOLINE) 50 MG tablet TAKE 1/2 TABLET EVERY MORNING AND 1 FULL TABLET IN THE EVENING FOR BLOOD PRESSURE   irbesartan (AVAPRO) 300 MG tablet Take 1 tablet (300 mg total) by mouth daily. For blood pressure.   metoprolol succinate (TOPROL-XL) 25 MG 24 hr tablet Take 1 tablet (25 mg total) by  mouth daily. For blood pressure.   Multiple Vitamins-Minerals (MULTIVITAMIN GUMMIES ADULTS) CHEW Chew 2 tablets by mouth daily.    omeprazole (PRILOSEC) 20 MG capsule Take 1 capsule by mouth every day for heartburn   polyethylene glycol powder (GLYCOLAX/MIRALAX) powder Mix 17 g into water once to twice daily as needed for constipation.   simvastatin (ZOCOR) 40 MG tablet Take 1 tablet (40 mg total) by mouth every evening. For cholesterol.   venlafaxine XR (EFFEXOR-XR) 150 MG 24 hr capsule Take 1 capsule (150 mg total) by mouth daily with breakfast. For anxiety and depression   [DISCONTINUED] bisacodyl (DULCOLAX) 5 MG EC tablet Take 5 mg by mouth daily as needed for moderate constipation.   No facility-administered encounter medications on file as of 04/27/2022.   Physical Exam: Blood pressure 120/72, pulse 84, temperature 98.3 F (36.8 C), temperature source Oral, height '5\' 1"'$  (1.549 m), weight 132 lb 9.6 oz (60.1 kg), SpO2 97 %. Gen:      No acute distress HEENT:  EOMI, sclera anicteric Neck:     No masses; no thyromegaly Lungs:    Bilateral expiratory wheeze CV:         Regular rate and rhythm; no murmurs Abd:      + bowel sounds; soft, non-tender; no palpable masses, no distension Ext:    No edema; adequate peripheral perfusion Skin:      Warm and dry; no rash Neuro: alert and oriented x 3 Psych: normal mood and affect   Data Reviewed: Imaging: Low-dose screening CT 09/10/2018- emphysema, subcentimeter pulmonary nodules measuring less than  3 mm.  Aortic atherosclerosis, coronary artery calcification.  Low-dose screening CT 09/19/2019-emphysema, bronchial wall thickening.  Stable pulmonary nodules.  Low-dose screening CT 10/04/2020-moderate to severe emphysema, stable pulmonary nodules. I have reviewed the images personally.  PFTs: 02/24/2020 FVC 2.38 [92%], FEV1 1.61 [83%], F/F 68, TLC 4.97 [108%] DLCO 13.11 [25%] Mild obstruction and diffusion impairment.  Labs: CBC  05/05/2019-WBC 9.3, eos 13%, absolute eosinophil count 1209 CBC 05/21/2019-WBC 5.3, eos 5.1%, absolute eosinophil count 270 IgE 05/21/2019-126 Alpha-1 antitrypsin 06/21/2019-135, PI MM  Assessment:  COPD with acute exacerbation She has responded well to prednisone last week but continues to be symptomatic.  We will give her Depo injection and additional prednisone taper, doxycycline Chest x-ray today Nebulizers  Suspect she may have a component of asthma as well given elevated peripheral eosinophilia Currently on Trelegy, albuterol  She will need reassessment of CBC and peripheral eosinophilia when off the steroids and consideration of additional biologics due to frequent exacerbations  Ex-smoker Has not had lung cancer screening CTs since 2021.  Placed referral for screening CT  Health maintenance 10/11/2016-  Prevnar 03/30/2020-Pneumovax  Plan/Recommendations: - Additional steroids, doxycycline, chest x-ray - Nebulizers - Screening CT referral  Marshell Garfinkel MD [facility] 04/27/2022, 9:38 AM  CC: [name], Morgan [name]

## 2022-05-01 ENCOUNTER — Telehealth: Payer: Self-pay | Admitting: Pulmonary Disease

## 2022-05-01 DIAGNOSIS — S22000A Wedge compression fracture of unspecified thoracic vertebra, initial encounter for closed fracture: Secondary | ICD-10-CM

## 2022-05-01 NOTE — Telephone Encounter (Signed)
Called patient and she is wanting to know if the doctor would order a brace for her back. Her spine doctor told her to ask her pulmonary doctor.   Please advise sir

## 2022-05-02 NOTE — Telephone Encounter (Signed)
Called and spoke with patient.  Patient aware DME order has been placed.  CXR faxed to requested Dr. Kristeen Miss and Dr. Madaline Brilliant.  Nothing further at this time.

## 2022-05-02 NOTE — Telephone Encounter (Signed)
Okay to order back brace from the DME company.

## 2022-05-10 ENCOUNTER — Telehealth: Payer: Self-pay | Admitting: Primary Care

## 2022-05-10 ENCOUNTER — Telehealth (INDEPENDENT_AMBULATORY_CARE_PROVIDER_SITE_OTHER): Payer: PPO | Admitting: Primary Care

## 2022-05-10 DIAGNOSIS — R21 Rash and other nonspecific skin eruption: Secondary | ICD-10-CM | POA: Diagnosis not present

## 2022-05-10 DIAGNOSIS — B029 Zoster without complications: Secondary | ICD-10-CM

## 2022-05-10 NOTE — Telephone Encounter (Signed)
Patient called in concerned that she may have shingles due to the pain that she is experiencing. She's not sure if she need to come in or not,and asked about just getting a blood test done to determine. She would like a call with advice.

## 2022-05-10 NOTE — Assessment & Plan Note (Signed)
Unable to clearly visualize rash during video visit.  I have asked that she take pictures of her rash and post them on her MyChart portal for my review. I will review photos and decide proper treatment.  HPI is suspicious for herpes zoster, but will await pictures. Anticipate treatment with valacyclovir 1 g 3 times daily x7 days.

## 2022-05-10 NOTE — Progress Notes (Signed)
[name] [facility], female    DOB: 1945/11/12, 76 y.o.   MRN: [id number]  Virtual visit completed through caregility, a video enabled telemedicine application. Due to national recommendations of social distancing due to COVID-19, a virtual visit is felt to be most appropriate for this patient at this time. Reviewed limitations, risks, security and privacy concerns of performing a virtual visit and the availability of in person appointments. I also reviewed that there may be a patient responsible charge related to this service. The patient agreed to proceed.   Patient location: home Provider location:  at [facility], office Persons participating in this virtual visit: patient, provider   If any vitals were documented, they were collected by patient at home unless specified below.    BP 139/80   Pulse 93   Ht 5' 1"  (1.549 m)   Wt 128 lb (58.1 kg)   SpO2 96%   BMI 24.19 kg/m    CC: Rash Subjective:   HPI: Morgan [name] is a 76 y.o. female with a history of COPD, hypertension, tobacco abuse, prediabetes, lower extremity edema presenting on 05/10/2022 for Rash  Her rash is located to the right lateral breast and chest wall which wraps around to her right thoracic back.   She's recently been treated for COPD exacerbation per pulmonology with Doxycycline, oral steroids, and nebulizer treatments. She underwent chest xray which found two midthoracic compression deformities, 1 chronic and 1 new since December 2022.  During her chest xray she noticed pain under her right breast. She did not see a rash or notice itching.  Two days ago she began to notice a rash under her right breast with spread to her right lateral chest wall, and right thoracic back. She's now noticed burning and pins and needles to the site. Today she began to notice blisters to the rash.        Relevant past medical, surgical, family and social history reviewed and updated as indicated. Interim  medical history since our last visit reviewed. Allergies and medications reviewed and updated. Outpatient Medications Prior to Visit  Medication Sig Dispense Refill   albuterol (VENTOLIN HFA) 108 (90 Base) MCG/ACT inhaler INHALE 2 PUFFS INTO THE LUNGS EVERY 4 HOURS AS NEEDED FOR WHEEZING OR SHORTNESS OF BREATH. 8.5 each 0   Budeson-Glycopyrrol-Formoterol (BREZTRI AEROSPHERE) 160-9-4.8 MCG/ACT AERO Inhale 2 puffs into the lungs 2 (two) times daily. 32.1 g 3   calcitonin, salmon, (MIACALCIN/FORTICAL) 200 UNIT/ACT nasal spray 1 spray daily.     Cholecalciferol 125 MCG (5000 UT) capsule Take 5,000 Units by mouth daily.     hydrALAZINE (APRESOLINE) 50 MG tablet TAKE 1/2 TABLET EVERY MORNING AND 1 FULL TABLET IN THE EVENING FOR BLOOD PRESSURE 135 tablet 3   ipratropium-albuterol (DUONEB) 0.5-2.5 (3) MG/3ML SOLN Take 3 mLs by nebulization every 6 (six) hours as needed. 360 mL 11   irbesartan (AVAPRO) 300 MG tablet Take 1 tablet (300 mg total) by mouth daily. For blood pressure. 90 tablet 3   metoprolol succinate (TOPROL-XL) 25 MG 24 hr tablet Take 1 tablet (25 mg total) by mouth daily. For blood pressure. 90 tablet 3   Multiple Vitamins-Minerals (MULTIVITAMIN GUMMIES ADULTS) CHEW Chew 2 tablets by mouth daily.      omeprazole (PRILOSEC) 20 MG capsule Take 1 capsule by mouth every day for heartburn 90 capsule 3   polyethylene glycol powder (GLYCOLAX/MIRALAX) powder Mix 17 g into water once to twice daily as needed for constipation. 3350 g 0  simvastatin (ZOCOR) 40 MG tablet Take 1 tablet (40 mg total) by mouth every evening. For cholesterol. 90 tablet 3   venlafaxine XR (EFFEXOR-XR) 150 MG 24 hr capsule Take 1 capsule (150 mg total) by mouth daily with breakfast. For anxiety and depression 90 capsule 1   doxycycline (VIBRA-TABS) 100 MG tablet Take 1 tablet (100 mg total) by mouth 2 (two) times daily. 14 tablet 0   predniSONE (DELTASONE) 10 MG tablet Take 68m x's 2  days,573m's2day,40mgx's2day,30mgx's2days,20mgx's2 day,1064ms2days 42 tablet 0   No facility-administered medications prior to visit.     Per HPI unless specifically indicated in ROS section below Review of Systems  Musculoskeletal:  Positive for arthralgias and back pain.  Skin:  Positive for rash.  Neurological:  Positive for numbness.   Objective:  BP 139/80   Pulse 93   Ht 5' 1"  (1.549 m)   Wt 128 lb (58.1 kg)   SpO2 96%   BMI 24.19 kg/m   Wt Readings from Last 3 Encounters:  05/10/22 128 lb (58.1 kg)  04/27/22 132 lb 9.6 oz (60.1 kg)  02/08/22 136 lb (61.7 kg)       Physical exam: General: Alert and oriented x 3, no distress, does not appear sickly  Pulmonary: Speaks in complete sentences without increased work of breathing, no cough during visit.  Psychiatric: Normal mood, thought content, and behavior.  Skin: Unable to clearly visualize rash during visit.     Results for orders placed or performed in visit on 11/25/21  CMP ([facility])  Result Value Ref Range   Sodium 139 135 - 145 mmol/L   Potassium 3.7 3.5 - 5.1 mmol/L   Chloride 102 98 - 111 mmol/L   CO2 29 22 - 32 mmol/L   Glucose, Bld 82 70 - 99 mg/dL   BUN 14 8 - 23 mg/dL   Creatinine 1.09 (H) 0.44 - 1.00 mg/dL   Calcium 10.3 8.9 - 10.3 mg/dL   Total Protein 6.6 6.5 - 8.1 g/dL   Albumin 4.1 3.5 - 5.0 g/dL   AST 19 15 - 41 U/L   ALT 10 0 - 44 U/L   Alkaline Phosphatase 66 38 - 126 U/L   Total Bilirubin 0.4 0.3 - 1.2 mg/dL   GFR, Estimated 53 (L) >60 mL/min   Anion gap 8 5 - 15  Kappa/lambda light chains  Result Value Ref Range   Kappa free light chain 27.8 (H) 3.3 - 19.4 mg/L   Lambda free light chains 15.1 5.7 - 26.3 mg/L   Kappa, lambda light chain ratio 1.84 (H) 0.26 - 1.65  Multiple Myeloma Panel (SPEP&IFE w/QIG)  Result Value Ref Range   IgG (Immunoglobin G), Serum 632 586 - 1,602 mg/dL   IgA 180 64 - 422 mg/dL   IgM (Immunoglobulin M), Srm 62 26 - 217 mg/dL   Total Protein  ELP 6.3 6.0 - 8.5 g/dL   Albumin SerPl Elph-Mcnc 3.8 2.9 - 4.4 g/dL   Alpha 1 0.3 0.0 - 0.4 g/dL   Alpha2 Glob SerPl Elph-Mcnc 0.7 0.4 - 1.0 g/dL   B-Globulin SerPl Elph-Mcnc 0.9 0.7 - 1.3 g/dL   Gamma Glob SerPl Elph-Mcnc 0.6 0.4 - 1.8 g/dL   M Protein SerPl Elph-Mcnc Not Observed Not Observed g/dL   Globulin, Total 2.5 2.2 - 3.9 g/dL   Albumin/Glob SerPl 1.6 0.7 - 1.7   IFE 1 Comment    Please Note Comment   CBC with Differential/Platelet  Result Value Ref Range  WBC 7.8 4.0 - 10.5 K/uL   RBC 4.03 3.87 - 5.11 MIL/uL   Hemoglobin 12.9 12.0 - 15.0 g/dL   HCT 38.5 36.0 - 46.0 %   MCV 95.5 80.0 - 100.0 fL   MCH 32.0 26.0 - 34.0 pg   MCHC 33.5 30.0 - 36.0 g/dL   RDW 12.9 11.5 - 15.5 %   Platelets 349 150 - 400 K/uL   nRBC 0.0 0.0 - 0.2 %   Neutrophils Relative % 54 %   Neutro Abs 4.2 1.7 - 7.7 K/uL   Lymphocytes Relative 30 %   Lymphs Abs 2.3 0.7 - 4.0 K/uL   Monocytes Relative 13 %   Monocytes Absolute 1.0 0.1 - 1.0 K/uL   Eosinophils Relative 2 %   Eosinophils Absolute 0.2 0.0 - 0.5 K/uL   Basophils Relative 1 %   Basophils Absolute 0.1 0.0 - 0.1 K/uL   Immature Granulocytes 0 %   Abs Immature Granulocytes 0.01 0.00 - 0.07 K/uL   Assessment & Plan:   Problem List Items Addressed This Visit       Musculoskeletal and Integument   Rash and nonspecific skin eruption    Unable to clearly visualize rash during video visit.  I have asked that she take pictures of her rash and post them on her MyChart portal for my review. I will review photos and decide proper treatment.  HPI is suspicious for herpes zoster, but will await pictures. Anticipate treatment with valacyclovir 1 g 3 times daily x7 days.        No orders of the defined types were placed in this encounter.  No orders of the defined types were placed in this encounter.   I discussed the assessment and treatment plan with the patient. The patient was provided an opportunity to ask questions and all were  answered. The patient agreed with the plan and demonstrated an understanding of the instructions. The patient was advised to call back or seek an in-person evaluation if the symptoms worsen or if the condition fails to improve as anticipated.  Follow up plan:  Please send me pictures of the rash via your MyChart portal.  I will be in touch again soon once I review your pictures.   [name], NP

## 2022-05-10 NOTE — Telephone Encounter (Signed)
Called patient made appointment for virtual visit today not able to make in office

## 2022-05-10 NOTE — Patient Instructions (Signed)
Please send me pictures of the rash via your MyChart portal.  I will be in touch again soon once I review your pictures.

## 2022-05-11 MED ORDER — VALACYCLOVIR HCL 1 G PO TABS: 1000.0000 mg | ORAL_TABLET | Freq: Two times a day (BID) | ORAL | 0 refills | Status: AC

## 2022-05-11 NOTE — Telephone Encounter (Signed)
Patient husband came by with pictures on phone. They are not able to get sent to my chart. Patient was not with him so there was not way for me to get logged in to her my chart. I have shown pictures to Kamiah. Patient husband advised that once Anda Kraft has a chance to review chart we will be in touch with instructions.

## 2022-05-11 NOTE — Telephone Encounter (Signed)
Patient returned call back,would like a call back

## 2022-05-11 NOTE — Telephone Encounter (Signed)
Attempted to contact pt.  No answer.  Vm box full.  Need to relay [name].

## 2022-05-11 NOTE — Addendum Note (Signed)
Addended by: [name] on: 05/11/2022 01:31 PM   Modules accepted: Orders

## 2022-05-11 NOTE — Telephone Encounter (Signed)
Spoke with pt relaying [name].  Pt verbalizes understanding and states she got a call from [facility] about the med.

## 2022-05-11 NOTE — Telephone Encounter (Signed)
Pictures reviewed and a representative of herpes zoster.  Please call patient and tell her to start taking Valtrex 1000 mg 3 times daily for 7 days.  I will send this to [facility] in [place].

## 2022-05-25 ENCOUNTER — Ambulatory Visit (INDEPENDENT_AMBULATORY_CARE_PROVIDER_SITE_OTHER): Payer: PPO | Admitting: Primary Care

## 2022-05-25 ENCOUNTER — Encounter: Payer: Self-pay | Admitting: Primary Care

## 2022-05-25 VITALS — BP 120/74 | HR 110 | Temp 98.6°F | Ht 61.0 in | Wt 124.0 lb

## 2022-05-25 DIAGNOSIS — J449 Chronic obstructive pulmonary disease, unspecified: Secondary | ICD-10-CM | POA: Diagnosis not present

## 2022-05-25 DIAGNOSIS — S22000S Wedge compression fracture of unspecified thoracic vertebra, sequela: Secondary | ICD-10-CM | POA: Diagnosis not present

## 2022-05-25 DIAGNOSIS — F419 Anxiety disorder, unspecified: Secondary | ICD-10-CM | POA: Diagnosis not present

## 2022-05-25 DIAGNOSIS — R29898 Other symptoms and signs involving the musculoskeletal system: Secondary | ICD-10-CM

## 2022-05-25 DIAGNOSIS — M545 Low back pain, unspecified: Secondary | ICD-10-CM | POA: Diagnosis not present

## 2022-05-25 DIAGNOSIS — F32A Depression, unspecified: Secondary | ICD-10-CM

## 2022-05-25 DIAGNOSIS — G8929 Other chronic pain: Secondary | ICD-10-CM | POA: Diagnosis not present

## 2022-05-25 DIAGNOSIS — I10 Essential (primary) hypertension: Secondary | ICD-10-CM | POA: Diagnosis not present

## 2022-05-25 DIAGNOSIS — S22000A Wedge compression fracture of unspecified thoracic vertebra, initial encounter for closed fracture: Secondary | ICD-10-CM | POA: Insufficient documentation

## 2022-05-25 DIAGNOSIS — I700 Atherosclerosis of aorta: Secondary | ICD-10-CM | POA: Diagnosis not present

## 2022-05-25 DIAGNOSIS — Z720 Tobacco use: Secondary | ICD-10-CM | POA: Diagnosis not present

## 2022-05-25 DIAGNOSIS — E785 Hyperlipidemia, unspecified: Secondary | ICD-10-CM | POA: Diagnosis not present

## 2022-05-25 DIAGNOSIS — R7303 Prediabetes: Secondary | ICD-10-CM

## 2022-05-25 DIAGNOSIS — Z122 Encounter for screening for malignant neoplasm of respiratory organs: Secondary | ICD-10-CM

## 2022-05-25 MED ORDER — GABAPENTIN 100 MG PO CAPS: ORAL_CAPSULE | ORAL | 0 refills | Status: DC

## 2022-05-25 NOTE — Assessment & Plan Note (Signed)
Evident on chest xray from July 2023. Two fractures.  Results have been forwarded to her spine doctor. Discussed for her to follow up with her spine doctor for further management.

## 2022-05-25 NOTE — Assessment & Plan Note (Signed)
Waxes and wanes, following with pulmonology, office notes reviewed from July 2023.  Continue Breztri 160-9-4.8 BID, Duonebs BID PRN, and albuterol PRN.  Handicap placard form completed today.

## 2022-05-25 NOTE — Assessment & Plan Note (Signed)
Controlled.  Continue irbesartan 300 mg daily, metoprolol succinate 25 mg daily, hydralazine 25 in AM and 50 mg in PM. CMP pending.

## 2022-05-25 NOTE — Assessment & Plan Note (Signed)
Controlled.  Continue venlafaxine ER 150 mg daily. 

## 2022-05-25 NOTE — Patient Instructions (Addendum)
Stop by the lab prior to leaving today. I will notify you of your results once received.   Take the prescriptions to the medical supply store.   Take the handicap placard form to the [employer].   Start gabapentin 100 mg for pain. Take 1-3 capsules by mouth once or twice daily for pain. This may cause drowsiness.  Notify your spine doctor about the two compression fractures of your back.  It was a pleasure to see you today!

## 2022-05-25 NOTE — Assessment & Plan Note (Signed)
LDL goal of <70. Continue simvastatin 40 mg daily.

## 2022-05-25 NOTE — Assessment & Plan Note (Signed)
Unfortunately she has resumed smoking, strongly advise she consider quitting. Referral placed back for lung cancer screening.
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
## 2022-05-25 NOTE — Assessment & Plan Note (Signed)
Continue simvastatin 40 mg daily. Repeat lipid panel pending.  

## 2022-05-25 NOTE — Assessment & Plan Note (Signed)
Encouraged to work on diet and exercise.  Repeat A1C pending.

## 2022-05-26 LAB — BASIC METABOLIC PANEL
BUN: 16 mg/dL (ref 6–23)
CO2: 27 mEq/L (ref 19–32)
Calcium: 9.5 mg/dL (ref 8.4–10.5)
Chloride: 93 mEq/L — ABNORMAL LOW (ref 96–112)
Creatinine, Ser: 0.86 mg/dL (ref 0.40–1.20)
GFR: 66.44 mL/min (ref >60.00)
Glucose, Bld: 100 mg/dL — ABNORMAL HIGH (ref 70–99)
Potassium: 3.9 mEq/L (ref 3.5–5.1)
Sodium: 129 mEq/L — ABNORMAL LOW (ref 135–145)

## 2022-05-26 LAB — LIPID PANEL
Cholesterol: 178 mg/dL (ref 0–200)
HDL: 103.7 mg/dL (ref >39.00)
LDL Cholesterol: 57 mg/dL (ref 0–99)
NonHDL: 73.99
Total CHOL/HDL Ratio: 2
Triglycerides: 84 mg/dL (ref 0.0–149.0)
VLDL: 16.8 mg/dL (ref 0.0–40.0)

## 2022-05-26 LAB — HEMOGLOBIN A1C: Hgb A1c MFr Bld: 6.6 % — ABNORMAL HIGH (ref 4.6–6.5)

## 2022-05-29 ENCOUNTER — Telehealth: Payer: Self-pay

## 2022-05-29 NOTE — Telephone Encounter (Signed)
Patient called in stating that she saw Morgan [name] last week and discussed, a raised toilet seat with handle bars and a walker with a seat as she often has to stop and rest, needs a seat. States she called [facility] in [place] and they have not received the prescription for these. [name] wants to know if we can get this information to them.

## 2022-05-29 NOTE — Telephone Encounter (Signed)
Called patient we sent to [facility]  [phone] is the phone number.

## 2022-05-31 NOTE — Telephone Encounter (Signed)
Called and spoke to [place] at [facility] and was advised that their fax # is [phone].

## 2022-06-01 ENCOUNTER — Other Ambulatory Visit: Payer: Self-pay | Admitting: Primary Care

## 2022-06-01 DIAGNOSIS — R29898 Other symptoms and signs involving the musculoskeletal system: Secondary | ICD-10-CM | POA: Insufficient documentation

## 2022-06-01 NOTE — Addendum Note (Signed)
Addended by: [name] on: 06/01/2022 01:34 PM   Modules accepted: Orders

## 2022-06-01 NOTE — Assessment & Plan Note (Addendum)
This was accidentally left out of the assessment and plan during her visit. Given her symptoms she was strongly advised to the time to pursue stroke work-up given her symptoms.  She declined stroke work-up at this time.  She calls back today (06/01/2022) agreeing to CT head. We will order CT head to start. Home health physical therapy ordered.

## 2022-06-01 NOTE — Telephone Encounter (Signed)
[facility]ed [facility] they have order for walker but not elevated seat. Verified the fax number and re faxed both orders. I have spoke to patient and let her know. She will reach out to me if no call back by end of the week.

## 2022-06-01 NOTE — Telephone Encounter (Signed)
Patient called to follow up on this and would like a call back with a update

## 2022-06-02 ENCOUNTER — Ambulatory Visit
Admission: RE | Admit: 2022-06-02 | Discharge: 2022-06-02 | Disposition: A | Discharge status: Home / Self Care | Payer: PPO | Source: Ambulatory Visit | Attending: Primary Care | Admitting: Primary Care

## 2022-06-02 ENCOUNTER — Other Ambulatory Visit: Payer: Self-pay | Admitting: Primary Care

## 2022-06-02 DIAGNOSIS — R29898 Other symptoms and signs involving the musculoskeletal system: Secondary | ICD-10-CM | POA: Insufficient documentation

## 2022-06-02 DIAGNOSIS — I639 Cerebral infarction, unspecified: Secondary | ICD-10-CM | POA: Diagnosis not present

## 2022-06-02 DIAGNOSIS — E871 Hypo-osmolality and hyponatremia: Secondary | ICD-10-CM

## 2022-06-05 ENCOUNTER — Telehealth: Payer: Self-pay

## 2022-06-05 DIAGNOSIS — E785 Hyperlipidemia, unspecified: Secondary | ICD-10-CM

## 2022-06-05 DIAGNOSIS — K219 Gastro-esophageal reflux disease without esophagitis: Secondary | ICD-10-CM

## 2022-06-05 DIAGNOSIS — F419 Anxiety disorder, unspecified: Secondary | ICD-10-CM

## 2022-06-05 DIAGNOSIS — I10 Essential (primary) hypertension: Secondary | ICD-10-CM

## 2022-06-05 MED ORDER — METOPROLOL SUCCINATE ER 25 MG PO TB24: 25.0000 mg | ORAL_TABLET | Freq: Every day | ORAL | 3 refills | Status: DC

## 2022-06-05 MED ORDER — HYDRALAZINE HCL 50 MG PO TABS: ORAL_TABLET | ORAL | 3 refills | Status: DC

## 2022-06-05 MED ORDER — SIMVASTATIN 40 MG PO TABS: 40.0000 mg | ORAL_TABLET | Freq: Every evening | ORAL | 3 refills | Status: DC

## 2022-06-05 MED ORDER — OMEPRAZOLE 20 MG PO CPDR: DELAYED_RELEASE_CAPSULE | ORAL | 3 refills | Status: DC

## 2022-06-05 MED ORDER — VENLAFAXINE HCL ER 150 MG PO CP24: 150.0000 mg | ORAL_CAPSULE | Freq: Every day | ORAL | 3 refills | Status: DC

## 2022-06-05 MED ORDER — IRBESARTAN 300 MG PO TABS: 300.0000 mg | ORAL_TABLET | Freq: Every day | ORAL | 3 refills | Status: DC

## 2022-06-05 NOTE — Telephone Encounter (Signed)
[place] Night - Client TELEPHONE ADVICE RECORD [facility] Patient Name: Morgan [name] [facility] Gender: Female DOB: 1946-01-28 Age: 76 Y 45 M 4 D Return Phone Number: [phone] (Primary) Address: [name]  [address] Client [facility] Night - Client Client Site [place] - Night Provider Alma Friendly - NP Contact Type Call Who Is Calling Patient / Member / Family / Caregiver Call Type Triage / Clinical Relationship To Patient Self Return Phone Number [phone] (Primary) Chief Complaint Prescription Refill or Medication Request (non symptomatic) Reason for Call Medication Question / Request Initial Comment Caller states she is in desperate need of refills. [facility]er states [facility]ill request several times and have not received a response and she gets them delivered to her house. Caller states she is out. No current symptoms. Translation No Nurse Assessment Nurse: Marcello Moores, RN, [place] Date/Time (Eastern Time): 06/02/2022 8:06:03 PM Confirm and document reason for call. If symptomatic, describe symptoms. ---Caller states she needs a prescription refills for simvastatin and the rest of her medications. States she does not know what medications she takes, and she receives them via mail order pharmacy. States no new or worsening symptoms at this time. Does the patient have any new or worsening symptoms? ---No Please document clinical information provided and list any resource used. ---Per client directive, recommended pt contact the office during regular office hours for prescription refill requests. Disp. Time Morgan [name] Time) Disposition Final User 06/02/2022 7:27:11 PM Send To Nurse Graylon Gunning, RN, [place] 06/02/2022 8:09:09 PM Clinical Call Yes Marcello Moores RN, [place] Final Disposition 06/02/2022 8:09:09 PM Clinical Call Yes Marcello Moores, RN, Paulino Rily

## 2022-06-05 NOTE — Telephone Encounter (Signed)
Patient called back in returning a phone call she received. Did inform the patient that refills were sent over to mail order pharmacy. Thank you!

## 2022-06-05 NOTE — Telephone Encounter (Signed)
Noted, see other phone note

## 2022-06-05 NOTE — Telephone Encounter (Signed)
Please notify patient that all requested refills were sent to her mail order pharmacy.

## 2022-06-05 NOTE — Addendum Note (Signed)
Addended by: [name] on: 06/05/2022 01:57 PM   Modules accepted: Orders

## 2022-06-05 NOTE — Telephone Encounter (Signed)
Tried to call patient and unable to leave a message because mailbox was full.

## 2022-06-07 ENCOUNTER — Other Ambulatory Visit (INDEPENDENT_AMBULATORY_CARE_PROVIDER_SITE_OTHER): Payer: PPO

## 2022-06-07 DIAGNOSIS — E871 Hypo-osmolality and hyponatremia: Secondary | ICD-10-CM

## 2022-06-07 LAB — BASIC METABOLIC PANEL
BUN: 12 mg/dL (ref 6–23)
CO2: 27 mEq/L (ref 19–32)
Calcium: 9.6 mg/dL (ref 8.4–10.5)
Chloride: 97 mEq/L (ref 96–112)
Creatinine, Ser: 0.79 mg/dL (ref 0.40–1.20)
GFR: 73.55 mL/min (ref >60.00)
Glucose, Bld: 140 mg/dL — ABNORMAL HIGH (ref 70–99)
Potassium: 4.1 mEq/L (ref 3.5–5.1)
Sodium: 134 mEq/L — ABNORMAL LOW (ref 135–145)

## 2022-06-14 ENCOUNTER — Other Ambulatory Visit: Payer: Self-pay | Admitting: *Deleted

## 2022-06-14 DIAGNOSIS — Z87891 Personal history of nicotine dependence: Secondary | ICD-10-CM

## 2022-06-14 DIAGNOSIS — Z122 Encounter for screening for malignant neoplasm of respiratory organs: Secondary | ICD-10-CM

## 2022-06-16 ENCOUNTER — Other Ambulatory Visit: Payer: Self-pay | Admitting: Neurological Surgery

## 2022-06-16 DIAGNOSIS — M47816 Spondylosis without myelopathy or radiculopathy, lumbar region: Secondary | ICD-10-CM

## 2022-06-26 ENCOUNTER — Ambulatory Visit
Admission: RE | Admit: 2022-06-26 | Discharge: 2022-06-26 | Disposition: A | Discharge status: Home / Self Care | Payer: PPO | Source: Ambulatory Visit | Attending: Neurological Surgery | Admitting: Neurological Surgery

## 2022-06-26 DIAGNOSIS — M47816 Spondylosis without myelopathy or radiculopathy, lumbar region: Secondary | ICD-10-CM

## 2022-06-26 DIAGNOSIS — M5126 Other intervertebral disc displacement, lumbar region: Secondary | ICD-10-CM | POA: Diagnosis not present

## 2022-06-26 DIAGNOSIS — M48061 Spinal stenosis, lumbar region without neurogenic claudication: Secondary | ICD-10-CM | POA: Diagnosis not present

## 2022-06-26 MED ORDER — GADOBENATE DIMEGLUMINE 529 MG/ML IV SOLN
11.0000 mL | Freq: Once | INTRAVENOUS | Status: AC | PRN
  Administered 2022-06-26: 11 mL via INTRAVENOUS

## 2022-06-27 ENCOUNTER — Ambulatory Visit
Admission: RE | Admit: 2022-06-27 | Discharge: 2022-06-27 | Disposition: A | Discharge status: Home / Self Care | Payer: PPO | Source: Ambulatory Visit | Attending: Acute Care | Admitting: Acute Care

## 2022-06-27 DIAGNOSIS — Z87891 Personal history of nicotine dependence: Secondary | ICD-10-CM | POA: Diagnosis not present

## 2022-06-27 DIAGNOSIS — Z122 Encounter for screening for malignant neoplasm of respiratory organs: Secondary | ICD-10-CM | POA: Diagnosis not present

## 2022-06-27 DIAGNOSIS — F17210 Nicotine dependence, cigarettes, uncomplicated: Secondary | ICD-10-CM | POA: Diagnosis not present

## 2022-06-28 ENCOUNTER — Other Ambulatory Visit: Payer: Self-pay

## 2022-06-28 DIAGNOSIS — F17210 Nicotine dependence, cigarettes, uncomplicated: Secondary | ICD-10-CM

## 2022-06-28 DIAGNOSIS — Z87891 Personal history of nicotine dependence: Secondary | ICD-10-CM

## 2022-06-28 DIAGNOSIS — S22060A Wedge compression fracture of T7-T8 vertebra, initial encounter for closed fracture: Secondary | ICD-10-CM | POA: Diagnosis not present

## 2022-06-28 DIAGNOSIS — Z122 Encounter for screening for malignant neoplasm of respiratory organs: Secondary | ICD-10-CM

## 2022-06-28 DIAGNOSIS — S32030A Wedge compression fracture of third lumbar vertebra, initial encounter for closed fracture: Secondary | ICD-10-CM | POA: Diagnosis not present

## 2022-07-10 DIAGNOSIS — M4856XA Collapsed vertebra, not elsewhere classified, lumbar region, initial encounter for fracture: Secondary | ICD-10-CM | POA: Diagnosis not present

## 2022-07-10 DIAGNOSIS — M8008XA Age-related osteoporosis with current pathological fracture, vertebra(e), initial encounter for fracture: Secondary | ICD-10-CM | POA: Diagnosis not present

## 2022-07-10 DIAGNOSIS — M4854XA Collapsed vertebra, not elsewhere classified, thoracic region, initial encounter for fracture: Secondary | ICD-10-CM | POA: Diagnosis not present

## 2022-07-10 DIAGNOSIS — M4155 Other secondary scoliosis, thoracolumbar region: Secondary | ICD-10-CM | POA: Diagnosis not present

## 2022-08-02 DIAGNOSIS — S22060A Wedge compression fracture of T7-T8 vertebra, initial encounter for closed fracture: Secondary | ICD-10-CM | POA: Diagnosis not present

## 2022-08-10 ENCOUNTER — Other Ambulatory Visit: Payer: Self-pay | Admitting: Primary Care

## 2022-08-10 DIAGNOSIS — J441 Chronic obstructive pulmonary disease with (acute) exacerbation: Secondary | ICD-10-CM

## 2022-08-10 DIAGNOSIS — J069 Acute upper respiratory infection, unspecified: Secondary | ICD-10-CM

## 2022-09-05 ENCOUNTER — Encounter: Payer: Self-pay | Admitting: Primary Care

## 2022-09-05 ENCOUNTER — Ambulatory Visit (INDEPENDENT_AMBULATORY_CARE_PROVIDER_SITE_OTHER): Payer: PPO | Admitting: Primary Care

## 2022-09-05 VITALS — BP 134/62 | HR 82 | Temp 98.6°F | Ht 61.0 in | Wt 122.0 lb

## 2022-09-05 DIAGNOSIS — B0229 Other postherpetic nervous system involvement: Secondary | ICD-10-CM

## 2022-09-05 DIAGNOSIS — E1165 Type 2 diabetes mellitus with hyperglycemia: Secondary | ICD-10-CM | POA: Diagnosis not present

## 2022-09-05 HISTORY — DX: Other postherpetic nervous system involvement: B02.29

## 2022-09-05 LAB — MICROALBUMIN / CREATININE URINE RATIO
Creatinine,U: 75.6 mg/dL
Microalb Creat Ratio: 1.2 mg/g (ref 0.0–30.0)
Microalb, Ur: 0.9 mg/dL (ref 0.0–1.9)

## 2022-09-05 LAB — HEMOGLOBIN A1C: Hgb A1c MFr Bld: 5.7 % (ref 4.6–6.5)

## 2022-09-05 MED ORDER — GABAPENTIN 100 MG PO CAPS: 100.0000 mg | ORAL_CAPSULE | Freq: Two times a day (BID) | ORAL | 0 refills | Status: DC

## 2022-09-05 NOTE — Assessment & Plan Note (Signed)
Treat with gabapentin 100 mg BID as this was effective previously.   New Rx sent to pharmacy.

## 2022-09-05 NOTE — Assessment & Plan Note (Signed)
New onset with A1C of 6.6 three months prior.  Repeat A1C pending today.   Foot exam today. Urine microalbumin due and pending. Managed on statin.  Follow up in 3-6 months based on A1C result. Remain off treatment at this time.
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
## 2022-09-05 NOTE — Patient Instructions (Signed)
Start gabapentin 100 mg for shingles pain. Take 1 capsule by mouth once or twice daily for pain.  Stop by the lab prior to leaving today. I will notify you of your results once received.   It was a pleasure to see you today!

## 2022-09-14 DIAGNOSIS — H6123 Impacted cerumen, bilateral: Secondary | ICD-10-CM | POA: Diagnosis not present

## 2022-09-14 DIAGNOSIS — H902 Conductive hearing loss, unspecified: Secondary | ICD-10-CM | POA: Diagnosis not present

## 2022-10-05 DIAGNOSIS — Z1231 Encounter for screening mammogram for malignant neoplasm of breast: Secondary | ICD-10-CM | POA: Diagnosis not present

## 2022-10-05 LAB — HM MAMMOGRAPHY

## 2022-10-06 ENCOUNTER — Encounter: Payer: Self-pay | Admitting: Primary Care

## 2022-10-11 ENCOUNTER — Telehealth: Payer: Self-pay | Admitting: Pulmonary Disease

## 2022-10-11 MED ORDER — TRELEGY ELLIPTA 100-62.5-25 MCG/ACT IN AEPB: 1.0000 | INHALATION_SPRAY | Freq: Every day | RESPIRATORY_TRACT | 5 refills | Status: DC

## 2022-10-11 NOTE — Telephone Encounter (Signed)
Called and spoke with patient. Advised Trelegy 100 has been sent to mail order pharmacy. She verbalized understanding. Nothing further needed.

## 2022-10-11 NOTE — Telephone Encounter (Signed)
Pt called requesting to start back on Trelegy. Pt has been currently using Breztri due to the copay of Trelegy being expensive. Pt states the Judithann Sauger is not as effective as Trelegy.   Dr. Vaughan Browner is it okay to refill the Trelegy? Please advise. If this is okay What dose of Trelegy?

## 2022-10-11 NOTE — Telephone Encounter (Signed)
Patient called to request a refill for her Trelegy.  Patient would like it sent thru the mail order.  Please advise and call if there are any questions at [phone]

## 2022-10-11 NOTE — Telephone Encounter (Signed)
Ok to refill trelegy 100

## 2022-10-20 ENCOUNTER — Telehealth: Payer: Self-pay | Admitting: Oncology

## 2022-10-23 ENCOUNTER — Telehealth: Payer: Self-pay | Admitting: Oncology

## 2022-10-23 NOTE — Telephone Encounter (Signed)
Called patient to r/s with new provider per Dr. Alen Blew transition. Patient r/s and notified.

## 2022-12-01 ENCOUNTER — Telehealth: Payer: Self-pay | Admitting: Primary Care

## 2022-12-01 ENCOUNTER — Other Ambulatory Visit: Payer: Self-pay

## 2022-12-01 ENCOUNTER — Inpatient Hospital Stay: Payer: PPO | Attending: Physician Assistant

## 2022-12-01 DIAGNOSIS — D472 Monoclonal gammopathy: Secondary | ICD-10-CM | POA: Diagnosis not present

## 2022-12-01 DIAGNOSIS — J309 Allergic rhinitis, unspecified: Secondary | ICD-10-CM

## 2022-12-01 LAB — CMP (CANCER CENTER ONLY)
ALT: 13 U/L (ref 0–44)
AST: 21 U/L (ref 15–41)
Albumin: 4.3 g/dL (ref 3.5–5.0)
Alkaline Phosphatase: 98 U/L (ref 38–126)
Anion gap: 7 (ref 5–15)
BUN: 13 mg/dL (ref 8–23)
CO2: 29 mmol/L (ref 22–32)
Calcium: 9.4 mg/dL (ref 8.9–10.3)
Chloride: 99 mmol/L (ref 98–111)
Creatinine: 0.71 mg/dL (ref 0.44–1.00)
GFR, Estimated: >60 mL/min (ref >60)
Glucose, Bld: 98 mg/dL (ref 70–99)
Potassium: 3.7 mmol/L (ref 3.5–5.1)
Sodium: 135 mmol/L (ref 135–145)
Total Bilirubin: 0.7 mg/dL (ref 0.3–1.2)
Total Protein: 7 g/dL (ref 6.5–8.1)

## 2022-12-01 LAB — CBC WITH DIFFERENTIAL/PLATELET
Abs Immature Granulocytes: 0.04 10*3/uL (ref 0.00–0.07)
Basophils Absolute: 0.1 10*3/uL (ref 0.0–0.1)
Basophils Relative: 1 %
Eosinophils Absolute: 0.1 10*3/uL (ref 0.0–0.5)
Eosinophils Relative: 1 %
HCT: 37.1 % (ref 36.0–46.0)
Hemoglobin: 13 g/dL (ref 12.0–15.0)
Immature Granulocytes: 0 %
Lymphocytes Relative: 12 %
Lymphs Abs: 1.4 10*3/uL (ref 0.7–4.0)
MCH: 33.1 pg (ref 26.0–34.0)
MCHC: 35 g/dL (ref 30.0–36.0)
MCV: 94.4 fL (ref 80.0–100.0)
Monocytes Absolute: 1 10*3/uL (ref 0.1–1.0)
Monocytes Relative: 9 %
Neutro Abs: 9 10*3/uL — ABNORMAL HIGH (ref 1.7–7.7)
Neutrophils Relative %: 77 %
Platelets: 366 10*3/uL (ref 150–400)
RBC: 3.93 MIL/uL (ref 3.87–5.11)
RDW: 13.4 % (ref 11.5–15.5)
WBC: 11.5 10*3/uL — ABNORMAL HIGH (ref 4.0–10.5)
nRBC: 0 % (ref 0.0–0.2)

## 2022-12-01 NOTE — Telephone Encounter (Signed)
Pt called in requesting a prescription for Flonase Nasal Spray . RX ix not currently on med list . Please advise [phone]

## 2022-12-02 MED ORDER — FLUTICASONE PROPIONATE 50 MCG/ACT NA SUSP: 1.0000 | Freq: Two times a day (BID) | NASAL | 0 refills | Status: DC | PRN

## 2022-12-02 NOTE — Telephone Encounter (Signed)
Noted, Rx sent to pharmacy. 

## 2022-12-04 LAB — KAPPA/LAMBDA LIGHT CHAINS
Kappa free light chain: 18.6 mg/L (ref 3.3–19.4)
Kappa, lambda light chain ratio: 1.25 (ref 0.26–1.65)
Lambda free light chains: 14.9 mg/L (ref 5.7–26.3)

## 2022-12-06 LAB — MULTIPLE MYELOMA PANEL, SERUM
Albumin SerPl Elph-Mcnc: 3.9 g/dL (ref 2.9–4.4)
Albumin/Glob SerPl: 1.7 (ref 0.7–1.7)
Alpha 1: 0.3 g/dL (ref 0.0–0.4)
Alpha2 Glob SerPl Elph-Mcnc: 0.7 g/dL (ref 0.4–1.0)
B-Globulin SerPl Elph-Mcnc: 0.8 g/dL (ref 0.7–1.3)
Gamma Glob SerPl Elph-Mcnc: 0.6 g/dL (ref 0.4–1.8)
Globulin, Total: 2.4 g/dL (ref 2.2–3.9)
IgA: 160 mg/dL (ref 64–422)
IgG (Immunoglobin G), Serum: 676 mg/dL (ref 586–1602)
IgM (Immunoglobulin M), Srm: 92 mg/dL (ref 26–217)
Total Protein ELP: 6.3 g/dL (ref 6.0–8.5)
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
## 2022-12-08 ENCOUNTER — Ambulatory Visit: Payer: PPO | Admitting: Oncology

## 2022-12-08 ENCOUNTER — Other Ambulatory Visit: Payer: Self-pay

## 2022-12-08 ENCOUNTER — Inpatient Hospital Stay: Payer: PPO | Attending: Oncology | Admitting: Physician Assistant

## 2022-12-08 VITALS — BP 162/73 | HR 79 | Temp 97.5°F | Resp 18 | Wt 122.5 lb

## 2022-12-08 DIAGNOSIS — I10 Essential (primary) hypertension: Secondary | ICD-10-CM | POA: Diagnosis not present

## 2022-12-08 DIAGNOSIS — D472 Monoclonal gammopathy: Secondary | ICD-10-CM | POA: Diagnosis not present

## 2023-01-12 ENCOUNTER — Ambulatory Visit (INDEPENDENT_AMBULATORY_CARE_PROVIDER_SITE_OTHER)
Admission: RE | Admit: 2023-01-12 | Discharge: 2023-01-12 | Disposition: A | Discharge status: Home / Self Care | Payer: PPO | Source: Ambulatory Visit | Attending: Primary Care | Admitting: Primary Care

## 2023-01-12 ENCOUNTER — Ambulatory Visit (INDEPENDENT_AMBULATORY_CARE_PROVIDER_SITE_OTHER): Payer: PPO | Admitting: Primary Care

## 2023-01-12 ENCOUNTER — Encounter: Payer: Self-pay | Admitting: Primary Care

## 2023-01-12 VITALS — BP 118/66 | HR 94 | Temp 97.4°F | Ht 61.0 in | Wt 119.0 lb

## 2023-01-12 DIAGNOSIS — M25512 Pain in left shoulder: Secondary | ICD-10-CM

## 2023-01-12 DIAGNOSIS — M25519 Pain in unspecified shoulder: Secondary | ICD-10-CM | POA: Insufficient documentation

## 2023-01-12 DIAGNOSIS — M19012 Primary osteoarthritis, left shoulder: Secondary | ICD-10-CM | POA: Diagnosis not present

## 2023-01-12 HISTORY — DX: Pain in unspecified shoulder: M25.519

## 2023-01-12 MED ORDER — CYCLOBENZAPRINE HCL 5 MG PO TABS: 5.0000 mg | ORAL_TABLET | Freq: Every evening | ORAL | 0 refills | Status: DC | PRN

## 2023-01-12 MED ORDER — GABAPENTIN 100 MG PO CAPS: 100.0000 mg | ORAL_CAPSULE | Freq: Two times a day (BID) | ORAL | 0 refills | Status: DC

## 2023-01-12 MED ORDER — PREDNISONE 20 MG PO TABS: ORAL_TABLET | ORAL | 0 refills | Status: DC
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
## 2023-01-12 NOTE — Assessment & Plan Note (Signed)
Symptoms and presentation representative of bursitis with nerve and muscle involvement. No alarm signs.  Start prednisone tablets. Take two tablets my mouth once daily in the morning for four days, then one tablet once daily in the morning for four days.  Start cyclobenzaprine 5 mg HS PRN. Will refill gabapentin 100 mg capsules.  Xray of the left shoulder ordered and pending. Follow up with orthopedics as scheduled.

## 2023-01-12 NOTE — Patient Instructions (Signed)
Complete xray(s) prior to leaving today. I will notify you of your results once received.  Start prednisone tablets. Take two tablets my mouth once daily in the morning for four days, then one tablet once daily in the morning for four days.   You may take the muscle relaxer, cyclobenzaprine, at bedtime as needed for muscle spasms.   You may take the gabapentin twice daily for pain.  Follow up with orthopedics as scheduled.  It was a pleasure to see you today!

## 2023-01-16 NOTE — Progress Notes (Signed)
[name] [name], MD, CAQ Sports Medicine [facility] at [facility] [address] [place], [address]  Phone: [phone]  FAX: [phone]  [name] [facility] - 77 y.o. female  MRN [id number]  Date of Birth: 10/25/1945  Date: 01/17/2023  PCP: Doreene Nest, NP  Referral: Doreene Nest, NP  Chief Complaint  Patient presents with   Shoulder Pain    Left   Subjective:   Morgan [name] is a 77 y.o. very pleasant female patient with Body mass index is 22.91 kg/m. who presents with the following:  Patient presents with left-sided shoulder pain.  She recently presented with her primary care doctor on January 12, 2023.  At that point plain films were reviewed, which I reviewed myself, as well.  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  There is also a small opacification adjacent to the bicipital groove.  Dr. Chestine Spore started her on a round of some prednisone, cyclobenzaprine, and gabapentin at nighttime.  2 months of shoulder pain.  Since she has been on prednisone, does not hurt quite as bad to move around.  Numbness and tingling going down her hand.    It had been moving around her shoulder, but was having some stabbing pain with moving her arms.  Using her R arm did cause some pain on the L once.   Prednisone seems to be helping the information.  Does not like flexeril.   Has had 4 different back surgeries, once last year.  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   F/u 6 weeks  Review of Systems is noted in the HPI, as appropriate  Objective:   BP (!) 140/78   Pulse 97   Temp 97.6 F (36.4 C) (Temporal)   Ht  (1.549 m)   Wt 121 lb 4 oz (55 kg)   SpO2 95%   BMI 22.91 kg/m   GEN: No acute distress; alert,appropriate. PULM: Breathing comfortably in no respiratory distress PSYCH: Normally interactive.    Shoulder: L Inspection: No muscle  wasting or winging Ecchymosis/edema: neg  AC joint, scapula, clavicle: NT Cervical spine: NT, full ROM Spurling's: neg Abduction: full, 5/5 Flexion: full, 5/5 IR, full, lift-off: 5/5 ER at neutral: full, 5/5 AC crossover: neg Neer: pos Hawkins: pos Drop Test: neg Empty Can: pos Supraspinatus insertion: mild-mod T Bicipital groove: NT Speed's: neg Yergason's: neg Sulcus sign: neg Scapular dyskinesis: none C5-T1 intact  Neuro: Sensation intact Grip 5/5   Laboratory and Imaging Data:  Assessment and Plan:     ICD-10-CM   1. Glenohumeral arthritis, left  M19.012 triamcinolone acetonide (KENALOG-40) injection 40 mg    2. Acute pain of left shoulder  M25.512 triamcinolone acetonide (KENALOG-40) injection 40 mg     I think this is a 2 part I am going pain with some significant pain at the glenohumeral joint, AC joint, as well as some mild rotator cuff tendinitis.  Acute on chronic with exacerbation.  I completely agree with her current plan of care including steroids and Flexeril as needed.  I am going to do an intra-articular injection today to try to calm down any form of arthritis flare and pain.  Social: Right now this is limiting her ability to use her left shoulder for maximal function  Intraarticular Shoulder Aspiration/Injection Procedure Note Dara Beidleman [facility] [birth year] Date of procedure: 01/17/2023  Procedure: Large Joint Aspiration / Injection of Shoulder, Intraarticular,  left Indications: Pain  Procedure Details Verbal consent was obtained from the patient. Risks explained and contrasted with benefits and alternatives. Patient prepped with Chloraprep and Ethyl Chloride used for anesthesia. An intraarticular shoulder injection was performed using the posterior approach; needle placed into joint capsule without difficulty. The patient tolerated the procedure well and had decreased pain post injection. No complications. Injection: 9 cc of Lidocaine 1% and 1 mL  Kenalog 40 mg. Needle: 21 gauge, 2 inch   Medication Management during today's office visit: Meds ordered this encounter  Medications   triamcinolone acetonide (KENALOG-40) injection 40 mg   Orders placed today for conditions managed today: No orders of the defined types were placed in this encounter.   Disposition: No follow-ups on file.  [employer] One speech-to-text software was used for transcription in this dictation.  Possible transcriptional errors can occur using Animal nutritionist.   Signed,  Elpidio Galea. Colton Tassin, MD   Outpatient Encounter Medications as of 01/17/2023  Medication Sig   albuterol (VENTOLIN HFA) 108 (90 Base) MCG/ACT inhaler INHALE 2 PUFFS BY MOUTH EVERY 4 HOURS AS NEEDED FOR WHEEZE OR FOR SHORTNESS OF BREATH   Budeson-Glycopyrrol-Formoterol (BREZTRI AEROSPHERE) 160-9-4.8 MCG/ACT AERO Inhale 2 puffs into the lungs 2 (two) times daily.   Cholecalciferol 125 MCG (5000 UT) capsule Take 5,000 Units by mouth daily.   cyclobenzaprine (FLEXERIL) 5 MG tablet Take 1 tablet (5 mg total) by mouth at bedtime as needed for muscle spasms.   fluticasone (FLONASE) 50 MCG/ACT nasal spray Place 1 spray into both nostrils 2 (two) times daily as needed for allergies or rhinitis.   Fluticasone-Umeclidin-Vilant (TRELEGY ELLIPTA) 100-62.5-25 MCG/ACT AEPB Inhale 1 puff into the lungs daily.   gabapentin (NEURONTIN) 100 MG capsule Take 1 capsule (100 mg total) by mouth 2 (two) times daily. For pain   hydrALAZINE (APRESOLINE) 50 MG tablet TAKE 1/2 TABLET EVERY MORNING AND 1 FULL TABLET IN THE EVENING FOR BLOOD PRESSURE   ipratropium-albuterol (DUONEB) 0.5-2.5 (3) MG/3ML SOLN Take 3 mLs by nebulization every 6 (six) hours as needed.   irbesartan (AVAPRO) 300 MG tablet Take 1 tablet (300 mg total) by mouth daily. For blood pressure.   Menaquinone-7 (VITAMIN K2 PO) Take by mouth.   metoprolol succinate (TOPROL-XL) 25 MG 24 hr tablet Take 1 tablet (25 mg total) by mouth daily. For blood  pressure.   Multiple Vitamins-Minerals (MULTIVITAMIN GUMMIES ADULTS) CHEW Chew 2 tablets by mouth daily.    omeprazole (PRILOSEC) 20 MG capsule Take 1 capsule by mouth every day for heartburn   polyethylene glycol powder (GLYCOLAX/MIRALAX) powder Mix 17 g into water once to twice daily as needed for constipation.   predniSONE (DELTASONE) 20 MG tablet Take two tablets my mouth once daily in the morning for four days, then one tablet once daily in the morning for four days.   simvastatin (ZOCOR) 40 MG tablet Take 1 tablet (40 mg total) by mouth every evening. For cholesterol.   venlafaxine XR (EFFEXOR-XR) 150 MG 24 hr capsule Take 1 capsule (150 mg total) by mouth daily with breakfast. For anxiety and depression   [EXPIRED] triamcinolone acetonide (KENALOG-40) injection 40 mg    No facility-administered encounter medications on file as of 01/17/2023.

## 2023-01-17 ENCOUNTER — Encounter: Payer: Self-pay | Admitting: Family Medicine

## 2023-01-17 ENCOUNTER — Ambulatory Visit (INDEPENDENT_AMBULATORY_CARE_PROVIDER_SITE_OTHER): Payer: PPO | Admitting: Family Medicine

## 2023-01-17 VITALS — BP 140/78 | HR 97 | Temp 97.6°F | Ht 61.0 in | Wt 121.2 lb

## 2023-01-17 DIAGNOSIS — M25512 Pain in left shoulder: Secondary | ICD-10-CM | POA: Diagnosis not present

## 2023-01-17 DIAGNOSIS — M19012 Primary osteoarthritis, left shoulder: Secondary | ICD-10-CM | POA: Diagnosis not present

## 2023-01-17 MED ORDER — TRIAMCINOLONE ACETONIDE 40 MG/ML IJ SUSP
40.0000 mg | Freq: Once | INTRAMUSCULAR | Status: AC
  Administered 2023-01-17: 40 mg via INTRA_ARTICULAR

## 2023-01-19 ENCOUNTER — Encounter: Payer: Self-pay | Admitting: Family Medicine
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
## 2023-02-21 ENCOUNTER — Encounter: Payer: Self-pay | Admitting: Family Medicine

## 2023-02-21 ENCOUNTER — Ambulatory Visit (INDEPENDENT_AMBULATORY_CARE_PROVIDER_SITE_OTHER): Payer: PPO | Admitting: Family Medicine

## 2023-02-21 VITALS — BP 110/60 | HR 95 | Temp 97.8°F | Ht 61.0 in | Wt 117.5 lb

## 2023-02-21 DIAGNOSIS — M25512 Pain in left shoulder: Secondary | ICD-10-CM | POA: Diagnosis not present

## 2023-02-21 DIAGNOSIS — M5412 Radiculopathy, cervical region: Secondary | ICD-10-CM | POA: Diagnosis not present

## 2023-02-21 DIAGNOSIS — M19012 Primary osteoarthritis, left shoulder: Secondary | ICD-10-CM | POA: Diagnosis not present

## 2023-02-21 MED ORDER — GABAPENTIN 100 MG PO CAPS: 100.0000 mg | ORAL_CAPSULE | Freq: Two times a day (BID) | ORAL | 1 refills | Status: DC

## 2023-02-21 MED ORDER — PREDNISONE 20 MG PO TABS: ORAL_TABLET | ORAL | 0 refills | Status: DC

## 2023-02-21 MED ORDER — GABAPENTIN 300 MG PO CAPS: 300.0000 mg | ORAL_CAPSULE | Freq: Two times a day (BID) | ORAL | 1 refills | Status: DC

## 2023-02-23 ENCOUNTER — Other Ambulatory Visit: Payer: Self-pay | Admitting: Primary Care

## 2023-02-23 DIAGNOSIS — J309 Allergic rhinitis, unspecified: Secondary | ICD-10-CM

## 2023-02-24 ENCOUNTER — Encounter: Payer: Self-pay | Admitting: Family Medicine

## 2023-03-14 ENCOUNTER — Ambulatory Visit: Payer: PPO | Admitting: Pulmonary Disease

## 2023-03-14 ENCOUNTER — Encounter: Payer: Self-pay | Admitting: Pulmonary Disease

## 2023-03-14 VITALS — BP 110/64 | HR 105 | Temp 98.1°F | Ht 61.0 in | Wt 116.6 lb

## 2023-03-14 DIAGNOSIS — J449 Chronic obstructive pulmonary disease, unspecified: Secondary | ICD-10-CM

## 2023-03-14 DIAGNOSIS — Z720 Tobacco use: Secondary | ICD-10-CM

## 2023-03-14 MED ORDER — TRELEGY ELLIPTA 100-62.5-25 MCG/ACT IN AEPB: 1.0000 | INHALATION_SPRAY | Freq: Every day | RESPIRATORY_TRACT | 0 refills | Status: DC

## 2023-03-14 NOTE — Patient Instructions (Signed)
I am glad you are doing better Will see if they have samples of Trelegy inhaler You can also use the Breztri you have at home Follow-up in 6 months

## 2023-03-14 NOTE — Addendum Note (Signed)
Addended by: Jacquiline Doe on: 03/14/2023 03:28 PM   Modules accepted: Orders

## 2023-03-14 NOTE — Progress Notes (Signed)
Morgan [name] [facility]    [id number]    Dec 31, 1945  Primary Care Physician:Clark, Keane Scrape, NP  Referring Physician: Doreene Nest, NP [address] [place],  [place] [address]  Chief complaint:  Follow-up for COPD  HPI: 77 y.o.  with history of COPD, GERD, hypertension, allergies, ex-smoker admitted in July 2020 for COPD exacerbation which is treated with steroids and nebulizer.  Follow-up with pulmonary in 2021 when she was switched from Advair to Trelegy  Pets: No pets Occupation: Retired [employer] Services operational officer.  Used to work in [employer] when she was young Exposures: No known exposures, no mold, hot tub, Jacuzzi Smoking history: 50-75-pack-year smoker.  Quit in December 2019.  Resumed smoking in 2021 Travel history: No significant travel history Relevant family history: No significant family history of lung disease  Interim history: Continues on Trelegy inhaler, albuterol as needed States that breathing is stable  Outpatient Encounter Medications as of 03/14/2023  Medication Sig   albuterol (VENTOLIN HFA) 108 (90 Base) MCG/ACT inhaler INHALE 2 PUFFS BY MOUTH EVERY 4 HOURS AS NEEDED FOR WHEEZE OR FOR SHORTNESS OF BREATH   Cholecalciferol 125 MCG (5000 UT) capsule Take 5,000 Units by mouth daily.   fluticasone (FLONASE) 50 MCG/ACT nasal spray PLACE 1 SPRAY INTO BOTH NOSTRILS 2 (TWO) TIMES DAILY AS NEEDED FOR ALLERGIES OR RHINITIS.   Fluticasone-Umeclidin-Vilant (TRELEGY ELLIPTA) 100-62.5-25 MCG/ACT AEPB Inhale 1 puff into the lungs daily.   gabapentin (NEURONTIN) 300 MG capsule Take 1 capsule (300 mg total) by mouth 2 (two) times daily. For pain   hydrALAZINE (APRESOLINE) 50 MG tablet TAKE 1/2 TABLET EVERY MORNING AND 1 FULL TABLET IN THE EVENING FOR BLOOD PRESSURE   ipratropium-albuterol (DUONEB) 0.5-2.5 (3) MG/3ML SOLN Take 3 mLs by nebulization every 6 (six) hours as needed.   irbesartan (AVAPRO) 300 MG tablet Take 1 tablet (300 mg total) by mouth daily. For blood pressure.    Menaquinone-7 (VITAMIN K2 PO) Take by mouth.   metoprolol succinate (TOPROL-XL) 25 MG 24 hr tablet Take 1 tablet (25 mg total) by mouth daily. For blood pressure.   Multiple Vitamins-Minerals (MULTIVITAMIN GUMMIES ADULTS) CHEW Chew 2 tablets by mouth daily.    omeprazole (PRILOSEC) 20 MG capsule Take 1 capsule by mouth every day for heartburn   polyethylene glycol powder (GLYCOLAX/MIRALAX) powder Mix 17 g into water once to twice daily as needed for constipation.   simvastatin (ZOCOR) 40 MG tablet Take 1 tablet (40 mg total) by mouth every evening. For cholesterol.   venlafaxine XR (EFFEXOR-XR) 150 MG 24 hr capsule Take 1 capsule (150 mg total) by mouth daily with breakfast. For anxiety and depression   [DISCONTINUED] predniSONE (DELTASONE) 20 MG tablet 2 tabs po for 7 days, then 1 tab po for 7 days   No facility-administered encounter medications on file as of 03/14/2023.   Physical Exam: Blood pressure 110/64, pulse (!) 105, temperature 98.1 F (36.7 C), temperature source Oral, height 5\' 1"  (1.549 m), weight 116 lb 9.6 oz (52.9 kg), SpO2 96 %. Gen:      No acute distress HEENT:  EOMI, sclera anicteric Neck:     No masses; no thyromegaly Lungs:    Clear to auscultation bilaterally; normal respiratory effort CV:         Regular rate and rhythm; no murmurs Abd:      + bowel sounds; soft, non-tender; no palpable masses, no distension Ext:    No edema; adequate peripheral perfusion Skin:  Warm and dry; no rash Neuro: alert and oriented x 3 Psych: normal mood and affect   Data Reviewed: Imaging: Low-dose screening CT 09/10/2018- emphysema, subcentimeter pulmonary nodules measuring less than 3 mm.  Aortic atherosclerosis, coronary artery calcification.  Low-dose screening CT 09/19/2019-emphysema, bronchial wall thickening.  Stable pulmonary nodules.  Low-dose screening CT 10/04/2020-moderate to severe emphysema, stable pulmonary nodules.  Low-dose screening CT 06/27/2022-centrilobular  and paraseptal emphysema stable pulmonary nodules I have reviewed the images personally.  PFTs: 02/24/2020 FVC 2.38 [92%], FEV1 1.61 [83%], F/F 68, TLC 4.97 [108%] DLCO 13.11 [25%] Mild obstruction and diffusion impairment.  Labs: CBC 05/05/2019-WBC 9.3, eos 13%, absolute eosinophil count 1209 CBC 05/21/2019-WBC 5.3, eos 5.1%, absolute eosinophil count 270 IgE 05/21/2019-126 Alpha-1 antitrypsin 06/21/2019-135, PI MM  Assessment:  COPD, asthma overlap Continue Trelegy.  She is in the donut hole and will need some samples to tide her through Continue nebulizers as needed  Suspect she may have a component of asthma as well given elevated peripheral eosinophilia in the past If she has recurrent exacerbations then check CBC and peripheral eosinophilia for consideration of additional biologics.  Ex-smoker Continue low-dose screening CT.  Health maintenance 10/11/2016-  Prevnar 03/30/2020-Pneumovax  Plan/Recommendations: - Continue Trelegy - Nebulizers - Screening CT  Chilton Greathouse MD [facility] 03/14/2023, 3:05 PM  CC: Doreene Nest, NP

## 2023-03-15 DIAGNOSIS — H902 Conductive hearing loss, unspecified: Secondary | ICD-10-CM | POA: Diagnosis not present

## 2023-03-15 DIAGNOSIS — H6121 Impacted cerumen, right ear: Secondary | ICD-10-CM | POA: Diagnosis not present

## 2023-03-21 ENCOUNTER — Ambulatory Visit (INDEPENDENT_AMBULATORY_CARE_PROVIDER_SITE_OTHER): Payer: PPO

## 2023-03-21 VITALS — Ht 61.0 in | Wt 115.0 lb

## 2023-03-21 DIAGNOSIS — Z0000 Encounter for general adult medical examination without abnormal findings: Secondary | ICD-10-CM | POA: Diagnosis not present

## 2023-03-21 NOTE — Patient Instructions (Signed)
Morgan [name] , Thank you for taking time to come for your Medicare Wellness Visit. I appreciate your ongoing commitment to your health goals. Please review the following plan we discussed and let me know if I can assist you in the future.   These are the goals we discussed:  Goals      Exercise 3x per week (30 min per time)     Would like to restart water aerobics. Quit smoking.      Manage My Medicine     Timeframe:  Long-Range Goal Priority:  Medium Start Date:     11/03/21                        Expected End Date: 11/03/22                      Follow Up Date Feb 2023   - call for medicine refill 2 or 3 days before it runs out - call if I am sick and can't take my medicine - keep a list of all the medicines I take; vitamins and herbals too - use a pillbox to sort medicine    Why is this important?   These steps will help you keep on track with your medicines.   Notes:      Patient Stated     09/21/2020, I will maintain and continue medications as prescribed.     Patient Stated     No new goals        This is a list of the screening recommended for you and due dates:  Health Maintenance  Topic Date Due   Complete foot exam   Never done   Eye exam for diabetics  Never done   DTaP/Tdap/Td vaccine (1 - Tdap) Never done   Hemoglobin A1C  03/06/2023   Cologuard (Stool DNA test)  05/04/2023   Flu Shot  05/10/2023   Screening for Lung Cancer  06/28/2023   Yearly kidney health urinalysis for diabetes  09/06/2023   Yearly kidney function blood test for diabetes  12/02/2023   Medicare Annual Wellness Visit  03/20/2024   Pneumonia Vaccine  Completed   DEXA scan (bone density measurement)  Completed   Hepatitis C Screening  Completed   HPV Vaccine  Aged Out   COVID-19 Vaccine  Discontinued   Zoster (Shingles) Vaccine  Discontinued    Advanced directives: none  Conditions/risks identified: Aim for 30 minutes of exercise or brisk walking, 6-8 glasses of water, and 5  servings of fruits and vegetables each day.   If you wish to quit smoking, help is available. For free tobacco cessation program offerings call the [facility] at 315-263-2280 or Live Well Line at 986-758-4343. You may also visit [email or web address] or email livelifewell@[place] .com for more information on other programs.   You may also call 1-800-QUIT-NOW (780 437 5937) or visit www.[place].ch or www.[email or web address] for additional resources on smoking cessation.    Next appointment: Follow up in one year for your annual wellness visit 03/24/24 @ 9:45 telephone   Preventive Care 65 Years and Older, Female Preventive care refers to lifestyle choices and visits with your health care provider that can promote health and wellness. What does preventive care include? A yearly physical exam. This is also called an annual well check. Dental exams once or twice a year. Routine eye exams. Ask your health care provider how often you should have your eyes checked. Personal lifestyle  choices, including: Daily care of your teeth and gums. Regular physical activity. Eating a healthy diet. Avoiding tobacco and drug use. Limiting alcohol use. Practicing safe sex. Taking low-dose aspirin every day. Taking vitamin and mineral supplements as recommended by your health care provider. What happens during an annual well check? The services and screenings done by your health care provider during your annual well check will depend on your age, overall health, lifestyle risk factors, and family history of disease. Counseling  Your health care provider may ask you questions about your: Alcohol use. Tobacco use. Drug use. Emotional well-being. Home and relationship well-being. Sexual activity. Eating habits. History of falls. Memory and ability to understand (cognition). Work and work Astronomer. Reproductive health. Screening  You may have the following tests or  measurements: Height, weight, and BMI. Blood pressure. Lipid and cholesterol levels. These may be checked every 5 years, or more frequently if you are over 62 years old. Skin check. Lung cancer screening. You may have this screening every year starting at age 31 if you have a 30-pack-year history of smoking and currently smoke or have quit within the past 15 years. Fecal occult blood test (FOBT) of the stool. You may have this test every year starting at age 29. Flexible sigmoidoscopy or colonoscopy. You may have a sigmoidoscopy every 5 years or a colonoscopy every 10 years starting at age 43. Hepatitis C blood test. Hepatitis B blood test. Sexually transmitted disease (STD) testing. Diabetes screening. This is done by checking your blood sugar (glucose) after you have not eaten for a while (fasting). You may have this done every 1-3 years. Bone density scan. This is done to screen for osteoporosis. You may have this done starting at age 46. Mammogram. This may be done every 1-2 years. Talk to your health care provider about how often you should have regular mammograms. Talk with your health care provider about your test results, treatment options, and if necessary, the need for more tests. Vaccines  Your health care provider may recommend certain vaccines, such as: Influenza vaccine. This is recommended every year. Tetanus, diphtheria, and acellular pertussis (Tdap, Td) vaccine. You may need a Td booster every 10 years. Zoster vaccine. You may need this after age 61. Pneumococcal 13-valent conjugate (PCV13) vaccine. One dose is recommended after age 85. Pneumococcal polysaccharide (PPSV23) vaccine. One dose is recommended after age 21. Talk to your health care provider about which screenings and vaccines you need and how often you need them. This information is not intended to replace advice given to you by your health care provider. Make sure you discuss any questions you have with your  health care provider. Document Released: 10/22/2015 Document Revised: 06/14/2016 Document Reviewed: 07/27/2015 Elsevier Interactive Patient Education  2017 [employer].  Fall Prevention in the Home Falls can cause injuries. They can happen to people of all ages. There are many things you can do to make your home safe and to help prevent falls. What can I do on the outside of my home? Regularly fix the edges of walkways and driveways and fix any cracks. Remove anything that might make you trip as you walk through a door, such as a raised step or threshold. Trim any bushes or trees on the path to your home. Use bright outdoor lighting. Clear any walking paths of anything that might make someone trip, such as rocks or tools. Regularly check to see if handrails are loose or broken. Make sure that both sides of any steps  have handrails. Any raised decks and porches should have guardrails on the edges. Have any leaves, snow, or ice cleared regularly. Use sand or salt on walking paths during winter. Clean up any spills in your garage right away. This includes oil or grease spills. What can I do in the bathroom? Use night lights. Install grab bars by the toilet and in the tub and shower. Do not use towel bars as grab bars. Use non-skid mats or decals in the tub or shower. If you need to sit down in the shower, use a plastic, non-slip stool. Keep the floor dry. Clean up any water that spills on the floor as soon as it happens. Remove soap buildup in the tub or shower regularly. Attach bath mats securely with double-sided non-slip rug tape. Do not have throw rugs and other things on the floor that can make you trip. What can I do in the bedroom? Use night lights. Make sure that you have a light by your bed that is easy to reach. Do not use any sheets or blankets that are too big for your bed. They should not hang down onto the floor. Have a firm chair that has side arms. You can use this for  support while you get dressed. Do not have throw rugs and other things on the floor that can make you trip. What can I do in the kitchen? Clean up any spills right away. Avoid walking on wet floors. Keep items that you use a lot in easy-to-reach places. If you need to reach something above you, use a strong step stool that has a grab bar. Keep electrical cords out of the way. Do not use floor polish or wax that makes floors slippery. If you must use wax, use non-skid floor wax. Do not have throw rugs and other things on the floor that can make you trip. What can I do with my stairs? Do not leave any items on the stairs. Make sure that there are handrails on both sides of the stairs and use them. Fix handrails that are broken or loose. Make sure that handrails are as long as the stairways. Check any carpeting to make sure that it is firmly attached to the stairs. Fix any carpet that is loose or worn. Avoid having throw rugs at the top or bottom of the stairs. If you do have throw rugs, attach them to the floor with carpet tape. Make sure that you have a light switch at the top of the stairs and the bottom of the stairs. If you do not have them, ask someone to add them for you. What else can I do to help prevent falls? Wear shoes that: Do not have high heels. Have rubber bottoms. Are comfortable and fit you well. Are closed at the toe. Do not wear sandals. If you use a stepladder: Make sure that it is fully opened. Do not climb a closed stepladder. Make sure that both sides of the stepladder are locked into place. Ask someone to hold it for you, if possible. Clearly mark and make sure that you can see: Any grab bars or handrails. First and last steps. Where the edge of each step is. Use tools that help you move around (mobility aids) if they are needed. These include: Canes. Walkers. Scooters. Crutches. Turn on the lights when you go into a dark area. Replace any light bulbs as soon  as they burn out. Set up your furniture so you have a clear path.  Avoid moving your furniture around. If any of your floors are uneven, fix them. If there are any pets around you, be aware of where they are. Review your medicines with your doctor. Some medicines can make you feel dizzy. This can increase your chance of falling. Ask your doctor what other things that you can do to help prevent falls. This information is not intended to replace advice given to you by your health care provider. Make sure you discuss any questions you have with your health care provider. Document Released: 07/22/2009 Document Revised: 03/02/2016 Document Reviewed: 10/30/2014 Elsevier Interactive Patient Education  2017 [employer].
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
## 2023-03-30 ENCOUNTER — Telehealth: Payer: Self-pay | Admitting: Primary Care

## 2023-03-30 DIAGNOSIS — F419 Anxiety disorder, unspecified: Secondary | ICD-10-CM

## 2023-03-30 NOTE — Telephone Encounter (Signed)
Prescription Request  03/30/2023  LOV: 01/12/2023  What is the name of the medication or equipment?  venlafaxine XR (EFFEXOR-XR) 150 MG 24 hr capsule  Have you contacted your pharmacy to request a refill? Yes   Which pharmacy would you like this sent to?   [facility], [place] - [address] (Ph: [phone])      Patient notified that their request is being sent to the clinical staff for review and that they should receive a response within 2 business days.   Please advise at [facility] [phone]

## 2023-03-30 NOTE — Telephone Encounter (Signed)
Patient has been scheduled

## 2023-03-30 NOTE — Telephone Encounter (Signed)
Patient is due for CPE/follow up in August. Please schedule, thank you!   

## 2023-04-04 ENCOUNTER — Ambulatory Visit: Payer: PPO | Admitting: Family Medicine

## 2023-04-10 ENCOUNTER — Other Ambulatory Visit: Payer: Self-pay | Admitting: Acute Care

## 2023-04-10 DIAGNOSIS — Z87891 Personal history of nicotine dependence: Secondary | ICD-10-CM

## 2023-04-10 DIAGNOSIS — F17210 Nicotine dependence, cigarettes, uncomplicated: Secondary | ICD-10-CM

## 2023-04-10 DIAGNOSIS — Z122 Encounter for screening for malignant neoplasm of respiratory organs: Secondary | ICD-10-CM

## 2023-04-13 MED ORDER — VENLAFAXINE HCL ER 150 MG PO CP24: 150.0000 mg | ORAL_CAPSULE | Freq: Every day | ORAL | 0 refills | Status: DC

## 2023-04-13 NOTE — Telephone Encounter (Signed)
Called pt and she stated that she does not have any medication and has been borrowing some from a friend. She said that she spoke to someone in the pharmacy. Called pharmacy and they said last refill was 3/24 and there is no refills on file.

## 2023-04-13 NOTE — Telephone Encounter (Signed)
Pt called asking for update on med refill for venlafaxine XR (EFFEXOR-XR) 150 MG 24 hr capsule [[id number]] ? Pt stated she contacted our office for refill a few weeks ago & still hasn't received meds. [facility]erred pharmacy [facility] [place], [place] - [address] (Ph: [phone]). Call back # [phone]

## 2023-04-13 NOTE — Addendum Note (Signed)
Addended by: Doreene Nest on: 04/13/2023 01:23 PM   Modules accepted: Orders

## 2023-04-13 NOTE — Telephone Encounter (Signed)
Please notify patient that I sent a 90-day supply of her venlafaxine to [facility].

## 2023-04-13 NOTE — Telephone Encounter (Signed)
Please notify patient that I provided a 1 year supply on 06/05/22 so she should have medication left at home. Does she not? She may have another refill on file with the pharmacy.   I am happy to refill but we need to wait until she has about 2 weeks left.   Let me know.

## 2023-04-13 NOTE — Telephone Encounter (Signed)
Called patient and reviewed all information. Patient verbalized understanding. Will call if any further questions.  

## 2023-05-09 ENCOUNTER — Encounter (INDEPENDENT_AMBULATORY_CARE_PROVIDER_SITE_OTHER): Payer: Self-pay

## 2023-05-29 ENCOUNTER — Encounter: Payer: Self-pay | Admitting: Primary Care

## 2023-05-29 ENCOUNTER — Telehealth: Payer: Self-pay | Admitting: Pharmacist

## 2023-05-29 ENCOUNTER — Ambulatory Visit (INDEPENDENT_AMBULATORY_CARE_PROVIDER_SITE_OTHER): Payer: PPO | Admitting: Primary Care

## 2023-05-29 VITALS — BP 120/64 | HR 90 | Temp 97.6°F | Ht 61.0 in | Wt 122.0 lb

## 2023-05-29 DIAGNOSIS — E1165 Type 2 diabetes mellitus with hyperglycemia: Secondary | ICD-10-CM | POA: Diagnosis not present

## 2023-05-29 DIAGNOSIS — Z0000 Encounter for general adult medical examination without abnormal findings: Secondary | ICD-10-CM | POA: Diagnosis not present

## 2023-05-29 DIAGNOSIS — Z1211 Encounter for screening for malignant neoplasm of colon: Secondary | ICD-10-CM

## 2023-05-29 DIAGNOSIS — F419 Anxiety disorder, unspecified: Secondary | ICD-10-CM | POA: Diagnosis not present

## 2023-05-29 DIAGNOSIS — D472 Monoclonal gammopathy: Secondary | ICD-10-CM | POA: Diagnosis not present

## 2023-05-29 DIAGNOSIS — E2839 Other primary ovarian failure: Secondary | ICD-10-CM

## 2023-05-29 DIAGNOSIS — E785 Hyperlipidemia, unspecified: Secondary | ICD-10-CM

## 2023-05-29 DIAGNOSIS — I10 Essential (primary) hypertension: Secondary | ICD-10-CM

## 2023-05-29 DIAGNOSIS — Z1231 Encounter for screening mammogram for malignant neoplasm of breast: Secondary | ICD-10-CM

## 2023-05-29 DIAGNOSIS — I700 Atherosclerosis of aorta: Secondary | ICD-10-CM | POA: Diagnosis not present

## 2023-05-29 DIAGNOSIS — J449 Chronic obstructive pulmonary disease, unspecified: Secondary | ICD-10-CM | POA: Diagnosis not present

## 2023-05-29 DIAGNOSIS — K219 Gastro-esophageal reflux disease without esophagitis: Secondary | ICD-10-CM

## 2023-05-29 DIAGNOSIS — F32A Depression, unspecified: Secondary | ICD-10-CM

## 2023-05-29 DIAGNOSIS — K5900 Constipation, unspecified: Secondary | ICD-10-CM | POA: Diagnosis not present

## 2023-05-29 DIAGNOSIS — M810 Age-related osteoporosis without current pathological fracture: Secondary | ICD-10-CM

## 2023-05-29 DIAGNOSIS — Z720 Tobacco use: Secondary | ICD-10-CM

## 2023-05-29 LAB — BASIC METABOLIC PANEL
BUN: 14 mg/dL (ref 6–23)
CO2: 28 mEq/L (ref 19–32)
Calcium: 9.7 mg/dL (ref 8.4–10.5)
Chloride: 98 mEq/L (ref 96–112)
Creatinine, Ser: 0.83 mg/dL (ref 0.40–1.20)
GFR: 68.84 mL/min (ref >60.00)
Glucose, Bld: 119 mg/dL — ABNORMAL HIGH (ref 70–99)
Potassium: 3.6 mEq/L (ref 3.5–5.1)
Sodium: 134 mEq/L — ABNORMAL LOW (ref 135–145)

## 2023-05-29 LAB — LIPID PANEL
Cholesterol: 201 mg/dL — ABNORMAL HIGH (ref 0–200)
HDL: 98 mg/dL (ref >39.00)
LDL Cholesterol: 85 mg/dL (ref 0–99)
NonHDL: 103.31
Total CHOL/HDL Ratio: 2
Triglycerides: 94 mg/dL (ref 0.0–149.0)
VLDL: 18.8 mg/dL (ref 0.0–40.0)

## 2023-05-29 LAB — HEMOGLOBIN A1C: Hgb A1c MFr Bld: 5.7 % (ref 4.6–6.5)

## 2023-05-29 NOTE — Assessment & Plan Note (Signed)
Controlled. Continue MiraLAX daily as needed.

## 2023-05-29 NOTE — Progress Notes (Unsigned)
Contacted patient regarding referral for medication access from Doreene Nest, NP .   Unable to leave a voicemail, voicemail full.  Catie Eppie Gibson, PharmD, BCACP, CPP Clinical Pharmacist [facility] [phone]

## 2023-05-29 NOTE — Assessment & Plan Note (Signed)
Lung cancer screening scheduled and up-to-date.
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
## 2023-05-29 NOTE — Assessment & Plan Note (Signed)
Overall controlled.  Condolences provided for the loss of her husband earlier this year.  Continue venlafaxine ER 150 mg daily.

## 2023-05-29 NOTE — Assessment & Plan Note (Signed)
Continue simvastatin 40 mg daily. Repeat lipid panel pending.  

## 2023-05-29 NOTE — Assessment & Plan Note (Signed)
Controlled.   Continue omeprazole 20 mg daily. 

## 2023-05-29 NOTE — Patient Instructions (Signed)
Stop by the lab prior to leaving today. I will notify you of your results once received.   Schedule both your mammogram and bone density scan for December of this year.  Complete the Cologuard kit once received.  Complete lung cancer screening is scheduled.  I will have my pharmacist get in touch with you regarding the Trelegy inhaler.  It was a pleasure to see you today!

## 2023-05-29 NOTE — Assessment & Plan Note (Signed)
Immunizations UTD.  Discussed Shingrix vaccines Mammogram up-to-date. Bone density scan due, orders placed, discussed with patient. Colon cancer screening due, Cologuard orders placed  Discussed the importance of a healthy diet and regular exercise in order for weight loss, and to reduce the risk of further co-morbidity.  Exam stable. Labs pending.  Follow up in 1 year for repeat physical.

## 2023-05-29 NOTE — Assessment & Plan Note (Signed)
Controlled.  Continue Irbesartan 300 mg daily, metoprolol succinate 25 mg daily, hydralazine 25 mg in AM and 50 mg daily.   Reviewed labs form February 2024.

## 2023-05-29 NOTE — Assessment & Plan Note (Signed)
Bone density scan due, orders placed and notify patient.  Continue calcium and vitamin D daily.

## 2023-05-29 NOTE — Assessment & Plan Note (Signed)
Repeat lipid panel pending.  Continue simvastatin 40 mg daily.

## 2023-05-29 NOTE — Assessment & Plan Note (Signed)
Repeat A1C pending.   Continue off medications.  

## 2023-05-29 NOTE — Assessment & Plan Note (Signed)
Following with pulmonology, office notes reviewed from June 2024.  Trelegy is cost prohibitive. Will connect with pharmacy to see if they can assist with cost.   Continue Duonebs PRN.

## 2023-05-29 NOTE — Assessment & Plan Note (Signed)
Following with oncology, office notes reviewed from March 2024. Continue annual monitoring.

## 2023-06-04 ENCOUNTER — Telehealth: Payer: Self-pay

## 2023-06-04 NOTE — Progress Notes (Signed)
   Care Guide Note  06/04/2023 Name: Millissa Murnane [facility] MRN: [id number] DOB: [birth year]  Referred by: Doreene Nest, NP Reason for referral : Care Coordination (Outreach to schedule with Pharm d )   Miyabi Porges [facility] is a 77 y.o. year old female who is a primary care patient of Doreene Nest, NP. Graylynn Basse [facility] was referred to the pharmacist for assistance related to HLD and DM.    An unsuccessful telephone outreach was attempted today to contact the patient who was referred to the pharmacy team for assistance with medication management. Additional attempts will be made to contact the patient.   Penne Lash, RMA Care Guide [facility]  [place], [place] [address] Direct Dial: [phone] [email or web address]

## 2023-06-12 NOTE — Progress Notes (Signed)
   Care Guide Note  06/12/2023 Name: Courtnee Rambeau [facility] MRN: [id number] DOB: 04-15-1946  Referred by: Doreene Nest, NP Reason for referral : Care Coordination (Outreach to schedule with Pharm d )   Alexiah Cittadino [facility] is a 77 y.o. year old female who is a primary care patient of Doreene Nest, NP. Tiwatope Denny [facility] was referred to the pharmacist for assistance related to HLD and DM.    A second unsuccessful telephone outreach was attempted today to contact the patient who was referred to the pharmacy team for assistance with medication management. Additional attempts will be made to contact the patient.  Penne Lash, RMA Care Guide [facility]  [place], [place] [address] Direct Dial: [phone] [email or web address]

## 2023-06-18 NOTE — Progress Notes (Signed)
   Care Guide Note  06/18/2023 Name: Morgan [name] [facility] MRN: [id number] DOB: 15-Apr-1946  Referred by: Doreene Nest, NP Reason for referral : Care Coordination (Outreach to schedule with Pharm d )   Morgan [name] [facility] is a 77 y.o. year old female who is a primary care patient of Doreene Nest, NP. Morgan [name] [facility] was referred to the pharmacist for assistance related to HLD and DM.    A third unsuccessful telephone outreach was attempted today to contact the patient who was referred to the pharmacy team for assistance with medication assistance. The Population Health team is pleased to engage with this patient at any time in the future upon receipt of referral and should he/she be interested in assistance from the [facility] team.   Penne Lash, RMA Care Guide [facility]  [place], [place] [address] Direct Dial: [phone] [email or web address]

## 2023-06-28 DIAGNOSIS — Z1211 Encounter for screening for malignant neoplasm of colon: Secondary | ICD-10-CM | POA: Diagnosis not present

## 2023-06-29 ENCOUNTER — Ambulatory Visit
Admission: RE | Admit: 2023-06-29 | Discharge: 2023-06-29 | Disposition: A | Discharge status: Home / Self Care | Payer: PPO | Source: Ambulatory Visit | Attending: Primary Care

## 2023-06-29 DIAGNOSIS — Z122 Encounter for screening for malignant neoplasm of respiratory organs: Secondary | ICD-10-CM

## 2023-06-29 DIAGNOSIS — F17210 Nicotine dependence, cigarettes, uncomplicated: Secondary | ICD-10-CM

## 2023-06-29 DIAGNOSIS — Z87891 Personal history of nicotine dependence: Secondary | ICD-10-CM

## 2023-07-05 LAB — COLOGUARD: COLOGUARD: NEGATIVE

## 2023-07-13 ENCOUNTER — Other Ambulatory Visit: Payer: Self-pay

## 2023-07-13 DIAGNOSIS — Z122 Encounter for screening for malignant neoplasm of respiratory organs: Secondary | ICD-10-CM

## 2023-07-13 DIAGNOSIS — Z87891 Personal history of nicotine dependence: Secondary | ICD-10-CM

## 2023-07-13 DIAGNOSIS — F17210 Nicotine dependence, cigarettes, uncomplicated: Secondary | ICD-10-CM

## 2023-07-24 ENCOUNTER — Telehealth: Payer: Self-pay | Admitting: Primary Care

## 2023-07-24 DIAGNOSIS — E785 Hyperlipidemia, unspecified: Secondary | ICD-10-CM

## 2023-07-24 DIAGNOSIS — F32A Depression, unspecified: Secondary | ICD-10-CM

## 2023-07-24 DIAGNOSIS — I10 Essential (primary) hypertension: Secondary | ICD-10-CM

## 2023-07-24 DIAGNOSIS — K219 Gastro-esophageal reflux disease without esophagitis: Secondary | ICD-10-CM

## 2023-07-24 NOTE — Telephone Encounter (Signed)
Prescription Request  07/24/2023  LOV: 05/29/2023  What is the name of the medication or equipment? venlafaxine XR (EFFEXOR-XR) 150 MG 24 hr capsule simvastatin (ZOCOR) 40 MG tablet omeprazole (PRILOSEC) 20 MG capsule metoprolol succinate (TOPROL-XL) 25 MG 24 hr tablet irbesartan (AVAPRO) 300 MG table   Have you contacted your pharmacy to request a refill? Yes   Which pharmacy would you like this sent to?   [facility] [employer], [place] - [address] [place] [address] [place] [address] Phone: [phone] Fax: [phone]    Patient notified that their request is being sent to the clinical staff for review and that they should receive a response within 2 business days.   Please advise at Mobile [phone] (mobile)

## 2023-07-25 MED ORDER — SIMVASTATIN 40 MG PO TABS: 40.0000 mg | ORAL_TABLET | Freq: Every evening | ORAL | 2 refills | Status: DC

## 2023-07-25 MED ORDER — VENLAFAXINE HCL ER 150 MG PO CP24: 150.0000 mg | ORAL_CAPSULE | Freq: Every day | ORAL | 2 refills | Status: DC

## 2023-07-25 MED ORDER — OMEPRAZOLE 20 MG PO CPDR: DELAYED_RELEASE_CAPSULE | ORAL | 2 refills | Status: DC

## 2023-07-25 MED ORDER — METOPROLOL SUCCINATE ER 25 MG PO TB24: 25.0000 mg | ORAL_TABLET | Freq: Every day | ORAL | 2 refills | Status: DC

## 2023-07-25 NOTE — Telephone Encounter (Signed)
Refills sent to pharmacy. 

## 2023-08-08 ENCOUNTER — Telehealth (INDEPENDENT_AMBULATORY_CARE_PROVIDER_SITE_OTHER): Payer: PPO | Admitting: Primary Care

## 2023-08-08 ENCOUNTER — Encounter: Payer: Self-pay | Admitting: Primary Care

## 2023-08-08 ENCOUNTER — Ambulatory Visit (INDEPENDENT_AMBULATORY_CARE_PROVIDER_SITE_OTHER)
Admission: RE | Admit: 2023-08-08 | Discharge: 2023-08-08 | Disposition: A | Discharge status: Home / Self Care | Payer: PPO | Source: Ambulatory Visit | Attending: Primary Care | Admitting: Primary Care

## 2023-08-08 DIAGNOSIS — R051 Acute cough: Secondary | ICD-10-CM | POA: Insufficient documentation

## 2023-08-08 DIAGNOSIS — I10 Essential (primary) hypertension: Secondary | ICD-10-CM | POA: Diagnosis not present

## 2023-08-08 DIAGNOSIS — J449 Chronic obstructive pulmonary disease, unspecified: Secondary | ICD-10-CM | POA: Diagnosis not present

## 2023-08-08 DIAGNOSIS — R0602 Shortness of breath: Secondary | ICD-10-CM | POA: Diagnosis not present

## 2023-08-08 MED ORDER — AMOXICILLIN-POT CLAVULANATE 875-125 MG PO TABS
1.0000 | ORAL_TABLET | Freq: Two times a day (BID) | ORAL | 0 refills | Status: DC
Start: 2023-08-08 — End: 2023-09-19

## 2023-08-08 MED ORDER — PREDNISONE 20 MG PO TABS: 40.0000 mg | ORAL_TABLET | Freq: Every day | ORAL | 0 refills | Status: DC

## 2023-08-08 MED ORDER — IRBESARTAN 300 MG PO TABS: 300.0000 mg | ORAL_TABLET | Freq: Every day | ORAL | 2 refills | Status: DC

## 2023-08-08 NOTE — Progress Notes (Signed)
[name] [facility], female    DOB: Sep 24, 1946, 77 y.o.   MRN: [id number]  Virtual visit completed through [employer], a video enabled telemedicine application. Due to national recommendations of social distancing due to COVID-19, a virtual visit is felt to be most appropriate for this patient at this time. Reviewed limitations, risks, security and privacy concerns of performing a virtual visit and the availability of in person appointments. I also reviewed that there may be a patient responsible charge related to this service. The patient agreed to proceed.   Patient location: home Provider location: [place] at [facility], office Persons participating in this virtual visit: patient, provider   If any vitals were documented, they were collected by patient at home unless specified below.    There were no vitals taken for this visit.   CC: Cough Subjective:   HPI: Morgan [name] is a 77 y.o. female with a history of COPD, tobacco use, hypertension, type 2 diabetes, GERD, presenting on 08/08/2023 for Cough (Pt had a 'cold' 3 weeks ago and has progressed into chest tightness, cough, SOB )  She is also needing refills of Irbesartan.  Symptom onset three weeks ago with URI symptoms including cough and chest congestion. She then developed increased shortness of breath, increased cough, increased sputum production.   She is compliant to her Breztri inhaler, has been using her albuterol inhaler daily due to symptoms. She was once prescribed Trelegy but this has become cost prohibitive. She was once receiving patient assistance for Trelegy.   She denies fevers, chills. She has not tested for Covid-19 infection.      Relevant past medical, surgical, family and social history reviewed and updated as indicated. Interim medical history since our last visit reviewed. Allergies and medications reviewed and updated. Outpatient Medications Prior to Visit  Medication Sig Dispense Refill    albuterol (VENTOLIN HFA) 108 (90 Base) MCG/ACT inhaler INHALE 2 PUFFS BY MOUTH EVERY 4 HOURS AS NEEDED FOR WHEEZE OR FOR SHORTNESS OF BREATH 8.5 each 0   Cholecalciferol 125 MCG (5000 UT) capsule Take 5,000 Units by mouth daily.     fluticasone (FLONASE) 50 MCG/ACT nasal spray PLACE 1 SPRAY INTO BOTH NOSTRILS 2 (TWO) TIMES DAILY AS NEEDED FOR ALLERGIES OR RHINITIS. 48 mL 0   hydrALAZINE (APRESOLINE) 50 MG tablet TAKE 1/2 TABLET EVERY MORNING AND 1 FULL TABLET IN THE EVENING FOR BLOOD PRESSURE 135 tablet 3   ipratropium-albuterol (DUONEB) 0.5-2.5 (3) MG/3ML SOLN Take 3 mLs by nebulization every 6 (six) hours as needed. 360 mL 11   Menaquinone-7 (VITAMIN K2 PO) Take by mouth.     metoprolol succinate (TOPROL-XL) 25 MG 24 hr tablet Take 1 tablet (25 mg total) by mouth daily. For blood pressure. 90 tablet 2   Multiple Vitamins-Minerals (MULTIVITAMIN GUMMIES ADULTS) CHEW Chew 2 tablets by mouth daily.      omeprazole (PRILOSEC) 20 MG capsule Take 1 capsule by mouth every day for heartburn 90 capsule 2   polyethylene glycol powder (GLYCOLAX/MIRALAX) powder Mix 17 g into water once to twice daily as needed for constipation. 3350 g 0   simvastatin (ZOCOR) 40 MG tablet Take 1 tablet (40 mg total) by mouth every evening. For cholesterol. 90 tablet 2   venlafaxine XR (EFFEXOR-XR) 150 MG 24 hr capsule Take 1 capsule (150 mg total) by mouth daily with breakfast. For anxiety and depression 90 capsule 2   irbesartan (AVAPRO) 300 MG tablet Take 1 tablet (300 mg total) by mouth daily.  For blood pressure. 90 tablet 3   Fluticasone-Umeclidin-Vilant (TRELEGY ELLIPTA) 100-62.5-25 MCG/ACT AEPB Inhale 1 puff into the lungs daily. (Patient not taking: Reported on 05/29/2023) 180 each 5   gabapentin (NEURONTIN) 300 MG capsule Take 1 capsule (300 mg total) by mouth 2 (two) times daily. For pain (Patient not taking: Reported on 05/29/2023) 180 capsule 1   No facility-administered medications prior to visit.     Per HPI unless  specifically indicated in ROS section below Review of Systems  Constitutional:  Positive for fatigue. Negative for chills and fever.  HENT:  Positive for congestion. Negative for sore throat.   Respiratory:  Positive for cough, chest tightness and shortness of breath.   Cardiovascular:  Negative for chest pain.  Neurological:  Negative for headaches.   Objective:  There were no vitals taken for this visit.  Wt Readings from Last 3 Encounters:  05/29/23 122 lb (55.3 kg)  03/21/23 115 lb (52.2 kg)  03/14/23 116 lb 9.6 oz (52.9 kg)       Physical exam: General: Alert and oriented x 3, no distress, appears sickly  Pulmonary: Speaks in complete sentences without increased work of breathing, congested cough noted during visit several times.  Psychiatric: Normal mood, thought content, and behavior.     Results for orders placed or performed in visit on 05/29/23  Cologuard  Result Value Ref Range   COLOGUARD Negative Negative  Lipid panel  Result Value Ref Range   Cholesterol 201 (H) 0 - 200 mg/dL   Triglycerides 01.0 0.0 - 149.0 mg/dL   HDL 27.25 >36.64 mg/dL   VLDL 40.3 0.0 - 47.4 mg/dL   LDL Cholesterol 85 0 - 99 mg/dL   Total CHOL/HDL Ratio 2    NonHDL 103.31   Hemoglobin A1c  Result Value Ref Range   Hgb A1c MFr Bld 5.7 4.6 - 6.5 %  Basic metabolic panel  Result Value Ref Range   Sodium 134 (L) 135 - 145 mEq/L   Potassium 3.6 3.5 - 5.1 mEq/L   Chloride 98 96 - 112 mEq/L   CO2 28 19 - 32 mEq/L   Glucose, Bld 119 (H) 70 - 99 mg/dL   BUN 14 6 - 23 mg/dL   Creatinine, Ser 2.59 0.40 - 1.20 mg/dL   GFR 56.38 >75.64 mL/min   Calcium 9.7 8.4 - 10.5 mg/dL   Assessment & Plan:   Problem List Items Addressed This Visit       Cardiovascular and Mediastinum   Essential hypertension   Relevant Medications   irbesartan (AVAPRO) 300 MG tablet     Respiratory   COPD (chronic obstructive pulmonary disease) (HCC)    Will involve pharmacy to initiate patient assistance for  Trelegy.       Relevant Medications   predniSONE (DELTASONE) 20 MG tablet     Other   Acute cough - Primary    Symptoms representative of COPD with exacerbation.  Cannot completely rule out pneumonia given virtual platform. Chest x-ray ordered. She will come today to complete.   Start Prednisone 40 mg PO daily for 5 days.    Start Amoxicillin-clavulanate 875-125 mg BID for 7 days.   Instructed patient that if no improvements in symptoms within 3 days, schedule office appointment.   I evaluated patient, was consulted regarding treatment, and agree with assessment and plan per Tenna Delaine, RN, DNP student.   Mayra Reel, NP-C       Relevant Medications   predniSONE (DELTASONE) 20 MG tablet  amoxicillin-clavulanate (AUGMENTIN) 875-125 MG tablet   Other Relevant Orders   DG Chest 2 View     Meds ordered this encounter  Medications   predniSONE (DELTASONE) 20 MG tablet    Sig: Take 2 tablets (40 mg total) by mouth daily with breakfast.    Dispense:  10 tablet    Refill:  0   amoxicillin-clavulanate (AUGMENTIN) 875-125 MG tablet    Sig: Take 1 tablet by mouth 2 (two) times daily.    Dispense:  14 tablet    Refill:  0   irbesartan (AVAPRO) 300 MG tablet    Sig: Take 1 tablet (300 mg total) by mouth daily. For blood pressure.    Dispense:  90 tablet    Refill:  2    Order Specific Question:   Supervising Provider    Answer:   Ermalene Searing, AMY E [[id number]]   Orders Placed This Encounter  Procedures   DG Chest 46 View    77 year old female with COPD history, and acute exacerbation, rule out pneumonia.    Standing Status:   Future    Standing Expiration Date:   08/07/2024    Order Specific Question:   Reason for Exam (SYMPTOM  OR DIAGNOSIS REQUIRED)    Answer:   77 year old female with history of COPD with increased SOB    Order Specific Question:   Preferred imaging location?    Answer:   [facility]    I discussed the assessment and treatment plan with the  patient. The patient was provided an opportunity to ask questions and all were answered. The patient agreed with the plan and demonstrated an understanding of the instructions. The patient was advised to call back or seek an in-person evaluation if the symptoms worsen or if the condition fails to improve as anticipated.  Follow up plan:  Come into the office for a chest x-ray.  Start Prednisone 2 tablet daily for 5 days.   Start Augmentin 1 tablet twice per day for 7 days.   It was a pleasure to see you today!  Doreene Nest, NP

## 2023-08-08 NOTE — Assessment & Plan Note (Addendum)
Symptoms representative of COPD with exacerbation.  Cannot completely rule out pneumonia given virtual platform. Chest x-ray ordered. She will come today to complete.   Start Prednisone 40 mg PO daily for 5 days.    Start Amoxicillin-clavulanate 875-125 mg BID for 7 days.   Instructed patient that if no improvements in symptoms within 3 days, schedule office appointment.   I evaluated patient, was consulted regarding treatment, and agree with assessment and plan per Tenna Delaine, RN, DNP student.   Mayra Reel, NP-C

## 2023-08-08 NOTE — Patient Instructions (Addendum)
Come into the office for a chest x-ray.  Start Prednisone 2 tablet daily for 5 days.   Start Augmentin 1 tablet twice per day for 7 days.   It was a pleasure to see you today!

## 2023-08-08 NOTE — Assessment & Plan Note (Signed)
Will involve pharmacy to initiate patient assistance for Trelegy.

## 2023-08-08 NOTE — Progress Notes (Signed)
[name] [facility], female    DOB: May 06, 1946, 77 y.o.   MRN: [id number]  Virtual visit completed through [employer], a video enabled telemedicine application. Due to national recommendations of social distancing due to COVID-19, a virtual visit is felt to be most appropriate for this patient at this time. Reviewed limitations, risks, security and privacy concerns of performing a virtual visit and the availability of in person appointments. I also reviewed that there may be a patient responsible charge related to this service. The patient agreed to proceed.   Patient location: home Provider location: [place] at [facility], office Persons participating in this virtual visit: patient Morgan [name], provider Mayra Reel, NP student Martie Lee  If any vitals were documented, they were collected by patient at home unless specified below.    There were no vitals taken for this visit.   CC: Cough and cold  Subjective:   HPI: Morgan [name] is a 77 y.o. female presenting on 08/08/2023 for Cough (Pt had a 'cold' 3 weeks ago and has progressed into chest tightness, cough, SOB )  She has a history of COPD, hypertension, hyperlipidemia, GERD, type 2 diabetes, osteoporosis, anxiety and depression who presents today via virtual visit to discuss cough and cold symptoms.   Symptom onset 3 weeks ago with a cold, congestion, and increased cough. She felt that this then moved down into her chest and she could not breathe well. She is coughing up more mucus than is usual for her. It is thick and yellow. She has increased shortness of breath, difficultly catching her breath, and increased cough. She does not wear home O2. She is using her Breztri inhaler daily but is also needing her albuterol rescue inhaler daily. Her nebulizer treatments are not helping much. No recent sick contacts.      Relevant past medical, surgical, family and social history reviewed and updated as indicated. Interim medical  history since our last visit reviewed. Allergies and medications reviewed and updated. Outpatient Medications Prior to Visit  Medication Sig Dispense Refill   albuterol (VENTOLIN HFA) 108 (90 Base) MCG/ACT inhaler INHALE 2 PUFFS BY MOUTH EVERY 4 HOURS AS NEEDED FOR WHEEZE OR FOR SHORTNESS OF BREATH 8.5 each 0   Cholecalciferol 125 MCG (5000 UT) capsule Take 5,000 Units by mouth daily.     fluticasone (FLONASE) 50 MCG/ACT nasal spray PLACE 1 SPRAY INTO BOTH NOSTRILS 2 (TWO) TIMES DAILY AS NEEDED FOR ALLERGIES OR RHINITIS. 48 mL 0   hydrALAZINE (APRESOLINE) 50 MG tablet TAKE 1/2 TABLET EVERY MORNING AND 1 FULL TABLET IN THE EVENING FOR BLOOD PRESSURE 135 tablet 3   ipratropium-albuterol (DUONEB) 0.5-2.5 (3) MG/3ML SOLN Take 3 mLs by nebulization every 6 (six) hours as needed. 360 mL 11   Menaquinone-7 (VITAMIN K2 PO) Take by mouth.     metoprolol succinate (TOPROL-XL) 25 MG 24 hr tablet Take 1 tablet (25 mg total) by mouth daily. For blood pressure. 90 tablet 2   Multiple Vitamins-Minerals (MULTIVITAMIN GUMMIES ADULTS) CHEW Chew 2 tablets by mouth daily.      omeprazole (PRILOSEC) 20 MG capsule Take 1 capsule by mouth every day for heartburn 90 capsule 2   polyethylene glycol powder (GLYCOLAX/MIRALAX) powder Mix 17 g into water once to twice daily as needed for constipation. 3350 g 0   simvastatin (ZOCOR) 40 MG tablet Take 1 tablet (40 mg total) by mouth every evening. For cholesterol. 90 tablet 2   venlafaxine XR (EFFEXOR-XR) 150 MG 24 hr capsule Take  1 capsule (150 mg total) by mouth daily with breakfast. For anxiety and depression 90 capsule 2   irbesartan (AVAPRO) 300 MG tablet Take 1 tablet (300 mg total) by mouth daily. For blood pressure. 90 tablet 3   Fluticasone-Umeclidin-Vilant (TRELEGY ELLIPTA) 100-62.5-25 MCG/ACT AEPB Inhale 1 puff into the lungs daily. (Patient not taking: Reported on 05/29/2023) 180 each 5   gabapentin (NEURONTIN) 300 MG capsule Take 1 capsule (300 mg total) by mouth 2  (two) times daily. For pain (Patient not taking: Reported on 05/29/2023) 180 capsule 1   No facility-administered medications prior to visit.     Per HPI unless specifically indicated in ROS section below Review of Systems  Constitutional:  Negative for chills, diaphoresis and fever.  HENT:  Positive for congestion and postnasal drip. Negative for sinus pressure and sinus pain.   Respiratory:  Positive for cough, chest tightness and shortness of breath.   Cardiovascular:  Negative for chest pain.  Gastrointestinal:  Negative for diarrhea, nausea and vomiting.  Neurological:  Negative for dizziness and light-headedness.    Objective:  There were no vitals taken for this visit.  Wt Readings from Last 3 Encounters:  05/29/23 122 lb (55.3 kg)  03/21/23 115 lb (52.2 kg)  03/14/23 116 lb 9.6 oz (52.9 kg)       Physical exam: General: Alert and oriented x 3, no distress, does not appear sickly  Pulmonary: Speaks in complete sentences without increased work of breathing, congested cough noted during visit.  Psychiatric: Normal mood, thought content, and behavior.     Results for orders placed or performed in visit on 05/29/23  Cologuard  Result Value Ref Range   COLOGUARD Negative Negative  Lipid panel  Result Value Ref Range   Cholesterol 201 (H) 0 - 200 mg/dL   Triglycerides 82.9 0.0 - 149.0 mg/dL   HDL 56.21 >30.86 mg/dL   VLDL 57.8 0.0 - 46.9 mg/dL   LDL Cholesterol 85 0 - 99 mg/dL   Total CHOL/HDL Ratio 2    NonHDL 103.31   Hemoglobin A1c  Result Value Ref Range   Hgb A1c MFr Bld 5.7 4.6 - 6.5 %  Basic metabolic panel  Result Value Ref Range   Sodium 134 (L) 135 - 145 mEq/L   Potassium 3.6 3.5 - 5.1 mEq/L   Chloride 98 96 - 112 mEq/L   CO2 28 19 - 32 mEq/L   Glucose, Bld 119 (H) 70 - 99 mg/dL   BUN 14 6 - 23 mg/dL   Creatinine, Ser 6.29 0.40 - 1.20 mg/dL   GFR 52.84 >13.24 mL/min   Calcium 9.7 8.4 - 10.5 mg/dL   Assessment & Plan:   Problem List Items  Addressed This Visit       Cardiovascular and Mediastinum   Essential hypertension   Relevant Medications   irbesartan (AVAPRO) 300 MG tablet     Other   Acute cough - Primary    Symptoms representative of COPD with exacerbation.  Cannot completely rule out pneumonia given virtual platform. Chest x-ray ordered.   Start Prednisone 40 mg PO daily for 5 days.    Start Amoxicillin-clavulanate 875-125 mg BID for 7 days.   Instructed patient that if no improvements in symptoms within 3 days, schedule office appointment.       Relevant Medications   predniSONE (DELTASONE) 20 MG tablet   amoxicillin-clavulanate (AUGMENTIN) 875-125 MG tablet   Other Relevant Orders   DG Chest 2 View     Meds ordered  this encounter  Medications   predniSONE (DELTASONE) 20 MG tablet    Sig: Take 2 tablets (40 mg total) by mouth daily with breakfast.    Dispense:  10 tablet    Refill:  0   amoxicillin-clavulanate (AUGMENTIN) 875-125 MG tablet    Sig: Take 1 tablet by mouth 2 (two) times daily.    Dispense:  14 tablet    Refill:  0   irbesartan (AVAPRO) 300 MG tablet    Sig: Take 1 tablet (300 mg total) by mouth daily. For blood pressure.    Dispense:  90 tablet    Refill:  2    Order Specific Question:   Supervising Provider    Answer:   Ermalene Searing, AMY E [[id number]]   Orders Placed This Encounter  Procedures   DG Chest 35 View    77 year old female with COPD history, and acute exacerbation, rule out pneumonia.    Standing Status:   Future    Standing Expiration Date:   08/07/2024    Order Specific Question:   Reason for Exam (SYMPTOM  OR DIAGNOSIS REQUIRED)    Answer:   77 year old female with history of COPD with increased SOB    Order Specific Question:   Preferred imaging location?    Answer:   [facility]    I discussed the assessment and treatment plan with the patient. The patient was provided an opportunity to ask questions and all were answered. The patient agreed with the plan  and demonstrated an understanding of the instructions. The patient was advised to call back or seek an in-person evaluation if the symptoms worsen or if the condition fails to improve as anticipated.  Follow up plan:  No follow-ups on file.  Benito Mccreedy, RN

## 2023-08-09 ENCOUNTER — Telehealth: Payer: Self-pay | Admitting: Primary Care

## 2023-08-09 NOTE — Telephone Encounter (Addendum)
Kelli, will you please call patient and let her know that she has to spend $600 out-of-pocket on medications at the pharmacy in order to receive patient assistance for Trelegy inhaler.  My recommendation is for her to contact her lung doctor to let them know that the Markus Daft is not as effective and to learn of other options.    ----- Message from Loree Fee sent at 08/09/2023 12:59 PM EDT ----- Regarding: RE: Patient Assistance Ok I see the issue.   Trelegy patient assistance will not send medications until patient has spent $600 out of pocket on medications at the pharmacy. Since patient is on generic meds (and receives Breztri through [employer]), reaching $600 is unlikely.   Breztri [employer] does not have a spending requirement, and therefore she is eligible for this year-round starting Jan 1 which is why she was switched in the past. Sadly as of now, lowest cost for Trelegy is likely her Medicare copay. ----- Message ----- From: Doreene Nest, NP Sent: 08/09/2023  11:22 AM EDT To: Loree Fee, RPH Subject: RE: Patient Assistance                         Thanks, you are the best! ----- Message ----- From: Loree Fee, [facility] Sent: 08/09/2023   9:38 AM EDT To: Doreene Nest, NP Subject: RE: Patient Assistance                         Will look into her eligibility and cc you with what I find. ----- Message ----- From: Doreene Nest, NP Sent: 08/08/2023  10:54 AM EDT To: Loree Fee, RPH Subject: Patient Assistance                             Hey!!!  This patient was once on patient assistance for Trelegy which has now expired. She does very well on Trelegy. Currently on Breztri which is not as effective.  Can we get her back on Trelegy?  Thanks! Mayra Reel, NP-C

## 2023-08-09 NOTE — Telephone Encounter (Signed)
Called patient and reviewed all information. Patient verbalized understanding. Will call if any further questions.  

## 2023-09-19 ENCOUNTER — Encounter: Payer: Self-pay | Admitting: Pulmonary Disease

## 2023-09-19 ENCOUNTER — Ambulatory Visit: Payer: PPO | Admitting: Pulmonary Disease

## 2023-09-19 VITALS — BP 122/72 | HR 87 | Temp 97.9°F | Ht 61.0 in | Wt 130.0 lb

## 2023-09-19 DIAGNOSIS — J449 Chronic obstructive pulmonary disease, unspecified: Secondary | ICD-10-CM | POA: Diagnosis not present

## 2023-09-19 DIAGNOSIS — F17210 Nicotine dependence, cigarettes, uncomplicated: Secondary | ICD-10-CM

## 2023-09-19 MED ORDER — NICOTINE 14 MG/24HR TD PT24: 14.0000 mg | MEDICATED_PATCH | Freq: Every day | TRANSDERMAL | 0 refills | Status: DC

## 2023-09-19 MED ORDER — TRELEGY ELLIPTA 100-62.5-25 MCG/ACT IN AEPB: 1.0000 | INHALATION_SPRAY | Freq: Every day | RESPIRATORY_TRACT | Status: AC

## 2023-09-19 MED ORDER — TRELEGY ELLIPTA 100-62.5-25 MCG/ACT IN AEPB: 1.0000 | INHALATION_SPRAY | Freq: Every day | RESPIRATORY_TRACT | 5 refills | Status: DC

## 2023-09-19 MED ORDER — VARENICLINE TARTRATE (STARTER) 0.5 MG X 11 & 1 MG X 42 PO TBPK: 1.0000 | ORAL_TABLET | Freq: Every day | ORAL | 0 refills | Status: AC

## 2023-09-19 NOTE — Patient Instructions (Signed)
VISIT SUMMARY:  During today's visit, we discussed your COPD management, tobacco use, lung cancer screening, and vaccinations. We addressed your difficulty affording your Trelegy inhaler and your recent increase in smoking. We also reviewed your recent CT scan results and discussed necessary vaccinations.  YOUR PLAN:  -COPD: Chronic Obstructive Pulmonary Disease (COPD) is a chronic inflammatory lung disease that causes obstructed airflow from the lungs. You are currently stable on Trelegy, but due to the high cost, we will provide you with samples until the new year. A prescription for Trelegy will be sent to be filled in January when you are out of the insurance 'donut hole.'  -TOBACCO USE: You have started smoking again after your husband's death and are currently smoking about a pack a day. To help you quit, we will send prescriptions for Chantix and nicotine patches to be filled in January when you are out of the insurance 'donut hole.'  -LUNG CANCER SCREENING: Your recent CT scan in September showed stable small nodules and emphysema. We will continue with annual CT scans to monitor your lung health.  -INFLUENZA VACCINATION: You have not received your flu shot for the current season. We will administer the influenza vaccine today.  -RSV VACCINATION: We discussed the availability of the RSV vaccine at [facility]. Please consider getting the RSV vaccine.  INSTRUCTIONS:  Please follow up in January to fill your prescriptions for Trelegy, Chantix, and nicotine patches. Continue with your annual CT scans for lung cancer screening. Consider getting the RSV vaccine at [facility].

## 2023-09-19 NOTE — Addendum Note (Signed)
Addended by: Delrae Rend on: 09/19/2023 04:27 PM   Modules accepted: Orders

## 2023-09-19 NOTE — Progress Notes (Signed)
Morgan [name] [facility]    [id number]    [birth year]  Primary Care Physician:Clark, Keane Scrape, NP  Referring Physician: Doreene Nest, NP [address] [place],  [place] [address]  Chief complaint:  Follow-up for COPD  HPI: 77 y.o.  with history of COPD, GERD, hypertension, allergies, ex-smoker admitted in July 2020 for COPD exacerbation which is treated with steroids and nebulizer.  Follow-up with pulmonary in 2021 when she was switched from Advair to Trelegy  Pets: No pets Occupation: Retired Diplomatic Services operational officer.  Used to work in [employer] when she was young Exposures: No known exposures, no mold, hot tub, Jacuzzi Smoking history: 50-75-pack-year smoker.  Quit in December 2019.  Resumed smoking in 2021 Travel history: No significant travel history Relevant family history: No significant family history of lung disease  Interim history: Discussed the use of AI scribe software for clinical note transcription with the patient, who gave verbal consent to proceed.  The patient, with a history of COPD, reports difficulty affording her Trelegy inhaler due to a high copay of $407, likely due to the Medicare Part D coverage gap, also known as the "donut hole." She has been receiving Trelegy from a friend who gets it for free and does not use it all the time. She also mentions that she was able to get [facility] for free last year when she was in the donut hole, but she can't get it for free anymore. She reports that her breathing is good as long as she uses her inhaler.  The patient also reports that she has started smoking again after the death of her husband. She had quit smoking and started vaping in May, but after her husband's death, she started smoking about a pack a day. She expresses confidence in her ability to quit on her own if she makes up her mind to do so, but she is open to trying nicotine patches and Chantix to help her quit.  The patient also mentions that she has less trouble  with her breathing in the winter than in the summer, when she really struggles. She has been getting annual CT scans, the last of which was in September and showed stable small nodules and emphysema.    Outpatient Encounter Medications as of 09/19/2023  Medication Sig   albuterol (VENTOLIN HFA) 108 (90 Base) MCG/ACT inhaler INHALE 2 PUFFS BY MOUTH EVERY 4 HOURS AS NEEDED FOR WHEEZE OR FOR SHORTNESS OF BREATH   Cholecalciferol 125 MCG (5000 UT) capsule Take 5,000 Units by mouth daily.   fluticasone (FLONASE) 50 MCG/ACT nasal spray PLACE 1 SPRAY INTO BOTH NOSTRILS 2 (TWO) TIMES DAILY AS NEEDED FOR ALLERGIES OR RHINITIS.   Fluticasone-Umeclidin-Vilant (TRELEGY ELLIPTA) 100-62.5-25 MCG/ACT AEPB Inhale 1 puff into the lungs daily.   gabapentin (NEURONTIN) 300 MG capsule Take 1 capsule (300 mg total) by mouth 2 (two) times daily. For pain   hydrALAZINE (APRESOLINE) 50 MG tablet TAKE 1/2 TABLET EVERY MORNING AND 1 FULL TABLET IN THE EVENING FOR BLOOD PRESSURE   ipratropium-albuterol (DUONEB) 0.5-2.5 (3) MG/3ML SOLN Take 3 mLs by nebulization every 6 (six) hours as needed.   irbesartan (AVAPRO) 300 MG tablet Take 1 tablet (300 mg total) by mouth daily. For blood pressure.   metoprolol succinate (TOPROL-XL) 25 MG 24 hr tablet Take 1 tablet (25 mg total) by mouth daily. For blood pressure.   Multiple Vitamins-Minerals (MULTIVITAMIN GUMMIES ADULTS) CHEW Chew 2 tablets by mouth daily.  omeprazole (PRILOSEC) 20 MG capsule Take 1 capsule by mouth every day for heartburn   polyethylene glycol powder (GLYCOLAX/MIRALAX) powder Mix 17 g into water once to twice daily as needed for constipation.   simvastatin (ZOCOR) 40 MG tablet Take 1 tablet (40 mg total) by mouth every evening. For cholesterol.   venlafaxine XR (EFFEXOR-XR) 150 MG 24 hr capsule Take 1 capsule (150 mg total) by mouth daily with breakfast. For anxiety and depression   [DISCONTINUED] amoxicillin-clavulanate (AUGMENTIN) 875-125 MG tablet Take 1  tablet by mouth 2 (two) times daily. (Patient not taking: Reported on 09/19/2023)   [DISCONTINUED] Menaquinone-7 (VITAMIN K2 PO) Take by mouth. (Patient not taking: Reported on 09/19/2023)   [DISCONTINUED] predniSONE (DELTASONE) 20 MG tablet Take 2 tablets (40 mg total) by mouth daily with breakfast. (Patient not taking: Reported on 09/19/2023)   No facility-administered encounter medications on file as of 09/19/2023.   Physical Exam: Blood pressure 122/72, pulse 87, temperature 97.9 F (36.6 C), temperature source Oral, height 5\' 1"  (1.549 m), weight 130 lb (59 kg), SpO2 96%. Gen:      No acute distress HEENT:  EOMI, sclera anicteric Neck:     No masses; no thyromegaly Lungs:    Clear to auscultation bilaterally; normal respiratory effort CV:         Regular rate and rhythm; no murmurs Abd:      + bowel sounds; soft, non-tender; no palpable masses, no distension Ext:    No edema; adequate peripheral perfusion Skin:      Warm and dry; no rash Neuro: alert and oriented x 3 Psych: normal mood and affect   Data Reviewed: Imaging: Low-dose screening CT 09/10/2018- emphysema, subcentimeter pulmonary nodules measuring less than 3 mm.  Aortic atherosclerosis, coronary artery calcification.  Low-dose screening CT 09/19/2019-emphysema, bronchial wall thickening.  Stable pulmonary nodules.  Low-dose screening CT 10/04/2020-moderate to severe emphysema, stable pulmonary nodules.  Low-dose screening CT 06/27/2022-centrilobular and paraseptal emphysema stable pulmonary nodules  Screening CT 07/12/2023-centrilobular and paraseptal emphysema, stable pulmonary nodules I have reviewed the images personally.  PFTs: 02/24/2020 FVC 2.38 [92%], FEV1 1.61 [83%], F/F 68, TLC 4.97 [108%] DLCO 13.11 [25%] Mild obstruction and diffusion impairment.  Labs: CBC 05/05/2019-WBC 9.3, eos 13%, absolute eosinophil count 1209 CBC 05/21/2019-WBC 5.3, eos 5.1%, absolute eosinophil count 270 IgE 05/21/2019-126 Alpha-1  antitrypsin 06/21/2019-135, PI MM  Assessment:  COPD, asthma overlap Suspect she may have a component of asthma as well given elevated peripheral eosinophilia in the past Stable on Trelegy, but patient reports difficulty affording medication due to being in the "donut hole" of her insurance. Patient is currently using Trelegy samples from a friend.  -Provide patient with Trelegy samples until the new year. -Send prescription for Trelegy to be filled in January when patient is out of the "donut hole".  Tobacco Use Patient reports increased smoking to a pack a day following the death of her husband. Expressed interest in quitting.  Time spent counseling-5 minutes.  Reassess at return visit -Send prescription for Chantix and nicotine patches to be filled in January when patient is out of the "donut hole".  Lung Cancer Screening Stable small nodules on recent CT scan in September. -Continue annual CT scans for lung cancer screening.  Influenza Vaccination Patient has not received flu shot for the current season. -Administer influenza vaccine today.  RSV Vaccination Discussed the availability of the RSV vaccine at [facility]. -Patient to consider getting RSV vaccine.   Health maintenance 10/11/2016-  Prevnar 03/30/2020-Pneumovax  Plan/Recommendations: -  Continue Trelegy - Nebulizers - Screening CT -Flu vaccine  Chilton Greathouse MD [facility] 09/19/2023, 1:42 PM  CC: Doreene Nest, NP

## 2023-10-11 DIAGNOSIS — Z1231 Encounter for screening mammogram for malignant neoplasm of breast: Secondary | ICD-10-CM | POA: Diagnosis not present

## 2023-10-11 LAB — HM MAMMOGRAPHY

## 2023-10-12 ENCOUNTER — Encounter: Payer: Self-pay | Admitting: Primary Care

## 2023-10-30 DIAGNOSIS — M5117 Intervertebral disc disorders with radiculopathy, lumbosacral region: Secondary | ICD-10-CM | POA: Diagnosis not present

## 2023-10-30 DIAGNOSIS — M5416 Radiculopathy, lumbar region: Secondary | ICD-10-CM | POA: Diagnosis not present

## 2023-10-30 DIAGNOSIS — M5116 Intervertebral disc disorders with radiculopathy, lumbar region: Secondary | ICD-10-CM | POA: Diagnosis not present

## 2023-11-16 ENCOUNTER — Other Ambulatory Visit (HOSPITAL_COMMUNITY): Payer: Self-pay

## 2023-11-16 ENCOUNTER — Telehealth: Payer: Self-pay

## 2023-11-16 ENCOUNTER — Ambulatory Visit: Payer: PPO | Admitting: Primary Care

## 2023-11-16 ENCOUNTER — Encounter: Payer: Self-pay | Admitting: Primary Care

## 2023-11-16 VITALS — BP 136/68 | HR 84 | Temp 97.4°F | Ht 61.0 in | Wt 125.0 lb

## 2023-11-16 DIAGNOSIS — M65352 Trigger finger, left little finger: Secondary | ICD-10-CM | POA: Insufficient documentation

## 2023-11-16 DIAGNOSIS — R1032 Left lower quadrant pain: Secondary | ICD-10-CM | POA: Insufficient documentation

## 2023-11-16 HISTORY — DX: Left lower quadrant pain: R10.32

## 2023-11-16 LAB — COMPREHENSIVE METABOLIC PANEL
ALT: 25 U/L (ref 0–35)
AST: 29 U/L (ref 0–37)
Albumin: 4.3 g/dL (ref 3.5–5.2)
Alkaline Phosphatase: 89 U/L (ref 39–117)
BUN: 19 mg/dL (ref 6–23)
CO2: 27 meq/L (ref 19–32)
Calcium: 9.1 mg/dL (ref 8.4–10.5)
Chloride: 91 meq/L — ABNORMAL LOW (ref 96–112)
Creatinine, Ser: 0.97 mg/dL (ref 0.40–1.20)
GFR: 56.91 mL/min — ABNORMAL LOW (ref >60.00)
Glucose, Bld: 114 mg/dL — ABNORMAL HIGH (ref 70–99)
Potassium: 4.9 meq/L (ref 3.5–5.1)
Sodium: 128 meq/L — ABNORMAL LOW (ref 135–145)
Total Bilirubin: 0.6 mg/dL (ref 0.2–1.2)
Total Protein: 6.6 g/dL (ref 6.0–8.3)

## 2023-11-16 LAB — CBC WITH DIFFERENTIAL/PLATELET
Basophils Absolute: 0 10*3/uL (ref 0.0–0.1)
Basophils Relative: 0.3 % (ref 0.0–3.0)
Eosinophils Absolute: 0 10*3/uL (ref 0.0–0.7)
Eosinophils Relative: 0.1 % (ref 0.0–5.0)
HCT: 38.6 % (ref 36.0–46.0)
Hemoglobin: 12.9 g/dL (ref 12.0–15.0)
Lymphocytes Relative: 11.9 % — ABNORMAL LOW (ref 12.0–46.0)
Lymphs Abs: 1.6 10*3/uL (ref 0.7–4.0)
MCHC: 33.4 g/dL (ref 30.0–36.0)
MCV: 98.5 fL (ref 78.0–100.0)
Monocytes Absolute: 1.3 10*3/uL — ABNORMAL HIGH (ref 0.1–1.0)
Monocytes Relative: 9.7 % (ref 3.0–12.0)
Neutro Abs: 10.3 10*3/uL — ABNORMAL HIGH (ref 1.4–7.7)
Neutrophils Relative %: 78 % — ABNORMAL HIGH (ref 43.0–77.0)
Platelets: 446 10*3/uL — ABNORMAL HIGH (ref 150.0–400.0)
RBC: 3.92 Mil/uL (ref 3.87–5.11)
RDW: 13 % (ref 11.5–15.5)
WBC: 13.2 10*3/uL — ABNORMAL HIGH (ref 4.0–10.5)

## 2023-11-16 MED ORDER — ONDANSETRON 4 MG PO TBDP: 4.0000 mg | ORAL_TABLET | Freq: Three times a day (TID) | ORAL | 0 refills | Status: DC | PRN

## 2023-11-16 MED ORDER — AMOXICILLIN-POT CLAVULANATE 875-125 MG PO TABS: 1.0000 | ORAL_TABLET | Freq: Two times a day (BID) | ORAL | 0 refills | Status: DC

## 2023-11-16 NOTE — Progress Notes (Signed)
 Acute Office Visit  Subjective:     [name] [facility], female    DOB: 11-28-1945, 78 y.o.   MRN: [id number]  Chief Complaint  Patient presents with   GI Problem    X3-4 weeks patient has been getting diarrhea, stomach pain/cramps, vomiting after a meal    Morgan [name] is a pleasant 78 year old female with history of hypertension, COPD, GERD, type 2 diabetes mellitus, aortic atherosclerosis, hyperlipidemia who presents today to discuss GI symptoms of nausea, abdominal cramping, needing to stool right after eating and vomiting after meals for 3-4 weeks. She also reports trigger finger 5th digit of left hand  Abdominal pain, cramping, nausea for 3-4 weeks - Reports 3-4 weeks of occasional cramping of LUQ and LLQ abdomen with occasional diarrhea, emesis and diaphoresis occurring 30 minutes after eating with some relief in symptoms. Episodes occur about 3x/weekly on average. She reports intermittent nausea. BM frequency daily, with range of normal consistency bowel movements to diarrhea - last diarrhea 11/13/23. She reports bloating and gas symptoms.  - She denies abdominal tenderness, urinary frequency or urgency. Denies pain and difficulty swallowing. She denies any known sick contacts.  - She had a similar episode >1 year ago, last 3-4 days, for which she did not seek care. - She has kept a food diary and cannot identify a trigger to her symptoms. She has a variable diet but avoids fried foods and has limited fatty food intake. She drinks mostly water, 3 cups coffee daily, Pedialtye x1 week, occasional tea and 2-3 beers in the evenings about 3 days/weekly. She currently smokes 1ppd.  - She normally takes Miralax  once daily, with last dose 11/12/23. Her reflux is controlled on omeprazole  with no breakthrough symptoms. She has tried TUMS over the counter with some relief of GI upset.   Stiffness and pain of 5th digit of left hand - Symptoms of joint pain and cramping x7 days of 5th digit of left  hand. Has tried Biofreeze and splint with some relief. Electrolyte replacement w/Pedialtye has helped cramping.  - Reports history of similar concern with left pollex in the past, treated with cortisone injection and relief - Last cortisone injection was an ESI by Dr. Colon on 10/30/23  Review of Systems  Constitutional:  Negative for chills and fever.  Respiratory:  Positive for cough.        Chronic, followed by pulmonology   Cardiovascular: Negative.   Gastrointestinal:  Positive for abdominal pain, diarrhea, nausea and vomiting. Negative for blood in stool and heartburn.  Genitourinary:  Negative for dysuria, frequency and urgency.  Musculoskeletal:  Positive for joint pain.       5th digit of left hand        Objective:    BP 136/68   Pulse 84   Temp (!) 97.4 F (36.3 C) (Temporal)   Ht 5' 1 (1.549 m)   Wt 56.7 kg   SpO2 98%   BMI 23.62 kg/m    Physical Exam Constitutional:      Appearance: Normal appearance.  Cardiovascular:     Rate and Rhythm: Normal rate.     Heart sounds: Normal heart sounds.  Pulmonary:     Effort: Pulmonary effort is normal.  Abdominal:     General: Bowel sounds are normal.     Palpations: Abdomen is soft.     Tenderness: There is abdominal tenderness in the left upper quadrant and left lower quadrant.       Comments: Tender to palpation  LLQ  Musculoskeletal:     Right hand: Normal.     Left hand: Tenderness present.  Skin:    General: Skin is warm and dry.  Neurological:     General: No focal deficit present.     Mental Status: She is alert and oriented to person, place, and time.  Psychiatric:        Mood and Affect: Mood normal.        Behavior: Behavior normal.     No results found for any visits on 11/16/23.       Assessment & Plan:   Problem List Items Addressed This Visit       Musculoskeletal and Integument   Trigger little finger of left hand   HPI and PE suggestive of acute trigger finger  Continue home  conservative measures  Schedule appointment with Dr. Ubaldo for evaluation and possible steroid injection         Other   Left lower quadrant abdominal pain - Primary   HPI and PE suggestive of acute diverticulitis   Reviewed 10/30/19 CT angio/abdomen/pelvis which revealed descending and sigmoid diverticulosis   Labs: CBC w/diff and CMET - results pending   Rx: Augmentin  875/125mg - 1 tablet PO twice daily x10 days Ondansetron  4mg  tablets - 1 dissolvable tablet PO q8h PRN for nausea   Consider CT abdomen if symptoms unresolved   Follow up as needed      Relevant Medications   amoxicillin -clavulanate (AUGMENTIN ) 875-125 MG tablet   ondansetron  (ZOFRAN -ODT) 4 MG disintegrating tablet   Other Relevant Orders   CBC with Differential   Comprehensive metabolic panel    Meds ordered this encounter  Medications   amoxicillin -clavulanate (AUGMENTIN ) 875-125 MG tablet    Sig: Take 1 tablet by mouth 2 (two) times daily.    Dispense:  20 tablet    Refill:  0   ondansetron  (ZOFRAN -ODT) 4 MG disintegrating tablet    Sig: Take 1 tablet (4 mg total) by mouth every 8 (eight) hours as needed for nausea or vomiting.    Dispense:  20 tablet    Refill:  0    Supervising Provider:   AVELINA, AMY E [[id number]]   Follow up with Dr. Ubaldo as advised Follow up for routine appointment or sooner as needed, based on symptoms.   Morgan [name] M Morgan Monks, RN

## 2023-11-16 NOTE — Assessment & Plan Note (Addendum)
 Differentials include acute diverticulitis, colitis.  Less representative of H. pylori and cholecystitis.   Reviewed 10/30/19 CT angio/abdomen/pelvis which revealed transverse, descending and sigmoid diverticulosis   Labs: CBC w/diff and CMET - results pending   Rx: Augmentin  875/125mg - 1 tablet PO twice daily x10 days Ondansetron  4mg  tablets - 1 dissolvable tablet PO q8h PRN for nausea   Offered CT abdomen/pelvis today for further evaluation, she declines today but will update if symptoms do not improve with antibiotic treatment.  I evaluated patient, was consulted regarding treatment, and agree with assessment and plan per Whitney Bingaman, MSN, FNP student.   Mallie Gaskins, NP-C
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
## 2023-11-16 NOTE — Patient Instructions (Addendum)
 START augmentin  -- take 1 tablet by mouth twice daily for 10 days  START zofran  4mg  dissolving tablet -- every 8 hours as needed  If your symptoms do not improve, we can order a CT test of your abdomen   Please schedule an appointment with Dr. Ubaldo for your finger/possible injection

## 2023-11-16 NOTE — Assessment & Plan Note (Addendum)
 HPI and PE suggestive of acute trigger finger  Continue home conservative measures  Schedule appointment with Dr. Watt for evaluation and possible steroid injection   I evaluated patient, was consulted regarding treatment, and agree with assessment and plan per Andriette Eke, MSN, FNP student.   Mallie Gaskins, NP-C

## 2023-11-16 NOTE — Telephone Encounter (Addendum)
 Pharmacy Patient Advocate Encounter   Received notification from CoverMyMeds that prior authorization for Ondansetron  is required/requested.   Insurance verification completed.   The patient is insured through [facility] ADVANTAGE/RX ADVANCE .   Per test claim: PA required; PA submitted to above mentioned insurance via CoverMyMeds Key/confirmation #/EOC fax via cmm [id number]   Status is pending

## 2023-11-18 NOTE — Progress Notes (Signed)
Athaliah Baumbach T. Rebecca Motta, MD, CAQ Sports Medicine [facility] [address] [place], [address]  Phone: [phone]  FAX: [phone]  Jay[facility] - 78 y.o. female  MRN [id number]  Date of Birth: Jun 18, 1946  Date: 11/19/2023  PCP: Doreene Nest, NP  Referral: Doreene Nest, NP  Chief Complaint  Patient presents with   Hand Pain    Left Pinky Finger   Subjective:   Morgan [name] is a 78 y.o. very pleasant female patient with Body mass index is 23.62 kg/m. who presents with the following:  The patient presents for issues with her left sided fifth digit.  Primary care provider referred her to me for potential trigger finger.  While she does have some triggering that is occurred at the fifth digit on the left, this has been intermittently going on for about a month.  For roughly 2 weeks she has developed a mallet finger and is unable to extend at the DIP joint.    She does not recall any kind of specific injury or incident.  Review of Systems is noted in the HPI, as appropriate  Objective:   BP 136/70 (BP Location: Left Arm, Patient Position: Sitting, Cuff Size: Normal)   Pulse 92   Temp 97.7 F (36.5 C) (Temporal)   Ht 5\' 1"  (1.549 m)   Wt 125 lb (56.7 kg)   SpO2 95%   BMI 23.62 kg/m   GEN: No acute distress; alert,appropriate. PULM: Breathing comfortably in no respiratory distress PSYCH: Normally interactive.   Left fifth digit tenderness at the DIP joint.  Patient is unable to extend at the DIP joint and she has a mallet deformity.  She also has a nodule and some triggering at the A1 pulley.  Laboratory and Imaging Data:  Assessment and Plan:     ICD-10-CM   1. Mallet deformity of left little finger  M20.012 DG Finger Little Left    2. Trigger little finger of left hand  M65.352      I think the primary issue here is the mallet finger on the left fifth digit.  If this is not treated, she will end up with a  permanent mallet finger.  She does not recall any kind of specific injury, however this did occur about 2 weeks ago and she does have pain at the DIP joint.  Plain x-ray of the fifth digit does not show a bony mallet finger or any other additional fracture.  She was placed in extension with a Stax splint.  I went over in significant detail that she would need to keep this fully in extension for 24 hours a day for the next 6 to 8 weeks.  I think that we can watch the triggering for now, and after the mallet finger is treated we could potentially do a tendon sheath injection.  Medication Management during today's office visit: No orders of the defined types were placed in this encounter.  Medications Discontinued During This Encounter  Medication Reason   Fluticasone-Umeclidin-Vilant (TRELEGY ELLIPTA) 100-62.5-25 MCG/ACT AEPB Completed Course   nicotine (NICODERM CQ - DOSED IN MG/24 HOURS) 14 mg/24hr patch Completed Course   gabapentin (NEURONTIN) 300 MG capsule Duplicate    Orders placed today for conditions managed today: Orders Placed This Encounter  Procedures   DG Finger Little Left    Disposition: Return in about 2 months (around 01/17/2024) for Dr. Patsy Lager, L pinky mallet finger.  Dragon Medical One speech-to-text software  was used for transcription in this dictation.  Possible transcriptional errors can occur using Animal nutritionist.   Signed,  Elpidio Galea. Nicholous Girgenti, MD   Outpatient Encounter Medications as of 11/19/2023  Medication Sig   albuterol (VENTOLIN HFA) 108 (90 Base) MCG/ACT inhaler INHALE 2 PUFFS BY MOUTH EVERY 4 HOURS AS NEEDED FOR WHEEZE OR FOR SHORTNESS OF BREATH   amoxicillin-clavulanate (AUGMENTIN) 875-125 MG tablet Take 1 tablet by mouth 2 (two) times daily.   Cholecalciferol 125 MCG (5000 UT) capsule Take 5,000 Units by mouth daily.   fluticasone (FLONASE) 50 MCG/ACT nasal spray PLACE 1 SPRAY INTO BOTH NOSTRILS 2 (TWO) TIMES DAILY AS NEEDED FOR ALLERGIES OR  RHINITIS.   Fluticasone-Umeclidin-Vilant (TRELEGY ELLIPTA) 100-62.5-25 MCG/ACT AEPB Inhale 1 puff into the lungs daily.   gabapentin (NEURONTIN) 300 MG capsule Take 300 mg by mouth 2 (two) times daily as needed.   hydrALAZINE (APRESOLINE) 50 MG tablet TAKE 1/2 TABLET EVERY MORNING AND 1 FULL TABLET IN THE EVENING FOR BLOOD PRESSURE   ipratropium-albuterol (DUONEB) 0.5-2.5 (3) MG/3ML SOLN Take 3 mLs by nebulization every 6 (six) hours as needed.   irbesartan (AVAPRO) 300 MG tablet Take 1 tablet (300 mg total) by mouth daily. For blood pressure.   metoprolol succinate (TOPROL-XL) 25 MG 24 hr tablet Take 1 tablet (25 mg total) by mouth daily. For blood pressure.   Multiple Vitamins-Minerals (MULTIVITAMIN GUMMIES ADULTS) CHEW Chew 2 tablets by mouth daily.    omeprazole (PRILOSEC) 20 MG capsule Take 1 capsule by mouth every day for heartburn   ondansetron (ZOFRAN-ODT) 4 MG disintegrating tablet Take 1 tablet (4 mg total) by mouth every 8 (eight) hours as needed for nausea or vomiting.   polyethylene glycol powder (GLYCOLAX/MIRALAX) powder Mix 17 g into water once to twice daily as needed for constipation.   simvastatin (ZOCOR) 40 MG tablet Take 1 tablet (40 mg total) by mouth every evening. For cholesterol.   venlafaxine XR (EFFEXOR-XR) 150 MG 24 hr capsule Take 1 capsule (150 mg total) by mouth daily with breakfast. For anxiety and depression   [DISCONTINUED] gabapentin (NEURONTIN) 300 MG capsule Take 1 capsule (300 mg total) by mouth 2 (two) times daily. For pain   [DISCONTINUED] Fluticasone-Umeclidin-Vilant (TRELEGY ELLIPTA) 100-62.5-25 MCG/ACT AEPB Inhale 1 puff into the lungs daily. (Patient not taking: Reported on 11/16/2023)   [DISCONTINUED] nicotine (NICODERM CQ - DOSED IN MG/24 HOURS) 14 mg/24hr patch Place 1 patch (14 mg total) onto the skin daily. (Patient not taking: Reported on 11/16/2023)   No facility-administered encounter medications on file as of 11/19/2023.

## 2023-11-19 ENCOUNTER — Ambulatory Visit (INDEPENDENT_AMBULATORY_CARE_PROVIDER_SITE_OTHER)
Admission: RE | Admit: 2023-11-19 | Discharge: 2023-11-19 | Disposition: A | Discharge status: Home / Self Care | Payer: PPO | Source: Ambulatory Visit | Attending: Family Medicine | Admitting: Family Medicine

## 2023-11-19 ENCOUNTER — Ambulatory Visit (INDEPENDENT_AMBULATORY_CARE_PROVIDER_SITE_OTHER): Payer: PPO | Admitting: Family Medicine

## 2023-11-19 VITALS — BP 136/70 | HR 92 | Temp 97.7°F | Ht 61.0 in | Wt 125.0 lb

## 2023-11-19 DIAGNOSIS — M20012 Mallet finger of left finger(s): Secondary | ICD-10-CM | POA: Diagnosis not present

## 2023-11-19 DIAGNOSIS — Z0389 Encounter for observation for other suspected diseases and conditions ruled out: Secondary | ICD-10-CM | POA: Diagnosis not present

## 2023-11-19 DIAGNOSIS — M65352 Trigger finger, left little finger: Secondary | ICD-10-CM

## 2023-11-20 ENCOUNTER — Ambulatory Visit: Payer: Self-pay | Admitting: Primary Care

## 2023-11-20 ENCOUNTER — Encounter: Payer: Self-pay | Admitting: Family Medicine

## 2023-11-20 ENCOUNTER — Other Ambulatory Visit (HOSPITAL_COMMUNITY): Payer: Self-pay

## 2023-11-20 NOTE — Telephone Encounter (Signed)
Noted, will await ED notes.

## 2023-11-20 NOTE — Telephone Encounter (Signed)
Chief Complaint: Altered mental status Symptoms: Dizzy Frequency: Past week Disposition: [x] ED /[] Urgent Care (no appt availability in office) / [] Appointment(In office/virtual)/ []  [facility]/ [] Home Care/ [] Refused Recommended Disposition /[] [facility]/ []  Follow-up with PCP Additional Notes: Pt son on phone, Thayer Ohm. Pt son states pt husband died in 2024-03-16. Pt son states he noticed last week the pt seemed of. Pt is repeating herself, stares at him, and will say "decline"or "express" when her son asks a question. Pt son states they got into it after this. Pt did not sleep last night. Pt fell last night from being dizzy. Pt son states she does live alone. Pt son told to take pt to ED. Pt son states understanding. This RN educated pt son on home care, new-worsening symptoms, when to call back/seek emergent care. Pt son verbalized understanding and agrees to plan.     Copied from CRM [id number]. Topic: Clinical - Red Word Triage >> Nov 20, 2023  4:39 PM [place] J wrote: [facility] that prompted transfer to Nurse Triage: Patient's son Jonny Ruiz *goes by [employer]*) calling in because of his mother's mental state declining. Stating that she spaces out and repeats random words over and over again while staring blankly at him. Forgetfulness as increased over the past 2 weeks. Reason for Disposition  Very strange or paranoid behavior  Answer Assessment - Initial Assessment Questions 1. LEVEL OF CONSCIOUSNESS: "How is he (she, the patient) acting right now?" (e.g., alert-oriented, confused, lethargic, stuporous, comatose)     Confused, pt states she "can't think" per son 2. ONSET: "When did the confusion start?"  (minutes, hours, days)     Past week 3. PATTERN "Does this come and go, or has it been constant since it started?"  "Is it present now?"     Started yesterday 4. ALCOHOL or DRUGS: "Has he been drinking alcohol or taking any drugs?"      Beer not a lot 5. NARCOTIC MEDICINES: "Has he  been receiving any narcotic medications?" (e.g., morphine, Vicodin)     Put to sleep 2 weeks ago for a shot in her spine, denies narcotic medications 6. CAUSE: "What do you think is causing the confusion?"      Stress/anxiety per pt  Protocols used: Confusion - Delirium-A-AH

## 2023-11-20 NOTE — Telephone Encounter (Signed)
Patient advised to go to ED .

## 2023-11-21 ENCOUNTER — Ambulatory Visit: Payer: Self-pay | Admitting: Primary Care

## 2023-11-21 ENCOUNTER — Emergency Department (HOSPITAL_COMMUNITY): Payer: PPO

## 2023-11-21 ENCOUNTER — Inpatient Hospital Stay (HOSPITAL_COMMUNITY)
Admission: EM | Admit: 2023-11-21 | Discharge: 2023-12-01 | DRG: 308 | Disposition: A | Discharge status: Home Health | Payer: PPO | Attending: Internal Medicine | Admitting: Internal Medicine

## 2023-11-21 ENCOUNTER — Other Ambulatory Visit: Payer: Self-pay

## 2023-11-21 ENCOUNTER — Inpatient Hospital Stay (HOSPITAL_COMMUNITY): Payer: PPO

## 2023-11-21 ENCOUNTER — Telehealth: Payer: Self-pay | Admitting: Primary Care

## 2023-11-21 DIAGNOSIS — E872 Acidosis, unspecified: Secondary | ICD-10-CM | POA: Diagnosis present

## 2023-11-21 DIAGNOSIS — E8809 Other disorders of plasma-protein metabolism, not elsewhere classified: Secondary | ICD-10-CM | POA: Diagnosis not present

## 2023-11-21 DIAGNOSIS — I10 Essential (primary) hypertension: Secondary | ICD-10-CM | POA: Diagnosis present

## 2023-11-21 DIAGNOSIS — Z885 Allergy status to narcotic agent status: Secondary | ICD-10-CM

## 2023-11-21 DIAGNOSIS — I4891 Unspecified atrial fibrillation: Principal | ICD-10-CM

## 2023-11-21 DIAGNOSIS — J9811 Atelectasis: Secondary | ICD-10-CM | POA: Diagnosis not present

## 2023-11-21 DIAGNOSIS — W19XXXA Unspecified fall, initial encounter: Secondary | ICD-10-CM | POA: Diagnosis present

## 2023-11-21 DIAGNOSIS — R109 Unspecified abdominal pain: Secondary | ICD-10-CM | POA: Diagnosis not present

## 2023-11-21 DIAGNOSIS — Z6823 Body mass index (BMI) 23.0-23.9, adult: Secondary | ICD-10-CM

## 2023-11-21 DIAGNOSIS — E222 Syndrome of inappropriate secretion of antidiuretic hormone: Secondary | ICD-10-CM | POA: Diagnosis not present

## 2023-11-21 DIAGNOSIS — R000 Tachycardia, unspecified: Secondary | ICD-10-CM | POA: Diagnosis not present

## 2023-11-21 DIAGNOSIS — J449 Chronic obstructive pulmonary disease, unspecified: Secondary | ICD-10-CM | POA: Diagnosis not present

## 2023-11-21 DIAGNOSIS — J189 Pneumonia, unspecified organism: Secondary | ICD-10-CM

## 2023-11-21 DIAGNOSIS — S0083XA Contusion of other part of head, initial encounter: Secondary | ICD-10-CM | POA: Diagnosis present

## 2023-11-21 DIAGNOSIS — R4182 Altered mental status, unspecified: Principal | ICD-10-CM

## 2023-11-21 DIAGNOSIS — R570 Cardiogenic shock: Secondary | ICD-10-CM | POA: Diagnosis not present

## 2023-11-21 DIAGNOSIS — E785 Hyperlipidemia, unspecified: Secondary | ICD-10-CM | POA: Diagnosis not present

## 2023-11-21 DIAGNOSIS — N179 Acute kidney failure, unspecified: Secondary | ICD-10-CM

## 2023-11-21 DIAGNOSIS — K219 Gastro-esophageal reflux disease without esophagitis: Secondary | ICD-10-CM | POA: Diagnosis not present

## 2023-11-21 DIAGNOSIS — Y92009 Unspecified place in unspecified non-institutional (private) residence as the place of occurrence of the external cause: Secondary | ICD-10-CM

## 2023-11-21 DIAGNOSIS — Z79899 Other long term (current) drug therapy: Secondary | ICD-10-CM | POA: Diagnosis not present

## 2023-11-21 DIAGNOSIS — K7200 Acute and subacute hepatic failure without coma: Secondary | ICD-10-CM | POA: Diagnosis not present

## 2023-11-21 DIAGNOSIS — E871 Hypo-osmolality and hyponatremia: Secondary | ICD-10-CM

## 2023-11-21 DIAGNOSIS — Z634 Disappearance and death of family member: Secondary | ICD-10-CM

## 2023-11-21 DIAGNOSIS — F69 Unspecified disorder of adult personality and behavior: Secondary | ICD-10-CM | POA: Diagnosis not present

## 2023-11-21 DIAGNOSIS — E860 Dehydration: Secondary | ICD-10-CM | POA: Diagnosis not present

## 2023-11-21 DIAGNOSIS — Z87440 Personal history of urinary (tract) infections: Secondary | ICD-10-CM

## 2023-11-21 DIAGNOSIS — F32A Depression, unspecified: Secondary | ICD-10-CM | POA: Diagnosis present

## 2023-11-21 DIAGNOSIS — F05 Delirium due to known physiological condition: Secondary | ICD-10-CM | POA: Diagnosis present

## 2023-11-21 DIAGNOSIS — I771 Stricture of artery: Secondary | ICD-10-CM | POA: Diagnosis not present

## 2023-11-21 DIAGNOSIS — R739 Hyperglycemia, unspecified: Secondary | ICD-10-CM | POA: Diagnosis not present

## 2023-11-21 DIAGNOSIS — Z7951 Long term (current) use of inhaled steroids: Secondary | ICD-10-CM

## 2023-11-21 DIAGNOSIS — J440 Chronic obstructive pulmonary disease with acute lower respiratory infection: Secondary | ICD-10-CM | POA: Diagnosis not present

## 2023-11-21 DIAGNOSIS — Z8249 Family history of ischemic heart disease and other diseases of the circulatory system: Secondary | ICD-10-CM

## 2023-11-21 DIAGNOSIS — Z1152 Encounter for screening for COVID-19: Secondary | ICD-10-CM | POA: Diagnosis not present

## 2023-11-21 DIAGNOSIS — Z888 Allergy status to other drugs, medicaments and biological substances status: Secondary | ICD-10-CM

## 2023-11-21 DIAGNOSIS — R110 Nausea: Secondary | ICD-10-CM | POA: Diagnosis not present

## 2023-11-21 DIAGNOSIS — Z7901 Long term (current) use of anticoagulants: Secondary | ICD-10-CM | POA: Diagnosis not present

## 2023-11-21 DIAGNOSIS — K5900 Constipation, unspecified: Secondary | ICD-10-CM | POA: Diagnosis present

## 2023-11-21 DIAGNOSIS — D649 Anemia, unspecified: Secondary | ICD-10-CM | POA: Diagnosis present

## 2023-11-21 DIAGNOSIS — F419 Anxiety disorder, unspecified: Secondary | ICD-10-CM | POA: Diagnosis not present

## 2023-11-21 DIAGNOSIS — Z881 Allergy status to other antibiotic agents status: Secondary | ICD-10-CM

## 2023-11-21 DIAGNOSIS — J441 Chronic obstructive pulmonary disease with (acute) exacerbation: Secondary | ICD-10-CM | POA: Diagnosis present

## 2023-11-21 DIAGNOSIS — M810 Age-related osteoporosis without current pathological fracture: Secondary | ICD-10-CM | POA: Diagnosis present

## 2023-11-21 DIAGNOSIS — J168 Pneumonia due to other specified infectious organisms: Secondary | ICD-10-CM | POA: Diagnosis not present

## 2023-11-21 DIAGNOSIS — F17210 Nicotine dependence, cigarettes, uncomplicated: Secondary | ICD-10-CM | POA: Diagnosis not present

## 2023-11-21 DIAGNOSIS — I6782 Cerebral ischemia: Secondary | ICD-10-CM | POA: Diagnosis not present

## 2023-11-21 DIAGNOSIS — R918 Other nonspecific abnormal finding of lung field: Secondary | ICD-10-CM | POA: Diagnosis not present

## 2023-11-21 DIAGNOSIS — R509 Fever, unspecified: Secondary | ICD-10-CM | POA: Diagnosis not present

## 2023-11-21 DIAGNOSIS — I959 Hypotension, unspecified: Secondary | ICD-10-CM | POA: Diagnosis not present

## 2023-11-21 DIAGNOSIS — R630 Anorexia: Secondary | ICD-10-CM | POA: Diagnosis present

## 2023-11-21 DIAGNOSIS — Z974 Presence of external hearing-aid: Secondary | ICD-10-CM

## 2023-11-21 DIAGNOSIS — G9341 Metabolic encephalopathy: Secondary | ICD-10-CM | POA: Diagnosis not present

## 2023-11-21 DIAGNOSIS — R0902 Hypoxemia: Secondary | ICD-10-CM | POA: Diagnosis not present

## 2023-11-21 DIAGNOSIS — R410 Disorientation, unspecified: Secondary | ICD-10-CM | POA: Diagnosis not present

## 2023-11-21 DIAGNOSIS — R579 Shock, unspecified: Secondary | ICD-10-CM | POA: Diagnosis not present

## 2023-11-21 HISTORY — DX: Acute kidney failure, unspecified: N17.9

## 2023-11-21 HISTORY — DX: Metabolic encephalopathy: G93.41

## 2023-11-21 LAB — SODIUM: Sodium: 127 mmol/L — ABNORMAL LOW (ref 135–145)

## 2023-11-21 LAB — CBC WITH DIFFERENTIAL/PLATELET
Abs Immature Granulocytes: 0.13 10*3/uL — ABNORMAL HIGH (ref 0.00–0.07)
Basophils Absolute: 0 10*3/uL (ref 0.0–0.1)
Basophils Relative: 0 %
Eosinophils Absolute: 0 10*3/uL (ref 0.0–0.5)
Eosinophils Relative: 0 %
HCT: 32.9 % — ABNORMAL LOW (ref 36.0–46.0)
Hemoglobin: 11.9 g/dL — ABNORMAL LOW (ref 12.0–15.0)
Immature Granulocytes: 1 %
Lymphocytes Relative: 4 %
Lymphs Abs: 0.7 10*3/uL (ref 0.7–4.0)
MCH: 33.4 pg (ref 26.0–34.0)
MCHC: 36.2 g/dL — ABNORMAL HIGH (ref 30.0–36.0)
MCV: 92.4 fL (ref 80.0–100.0)
Monocytes Absolute: 1 10*3/uL (ref 0.1–1.0)
Monocytes Relative: 6 %
Neutro Abs: 13.7 10*3/uL — ABNORMAL HIGH (ref 1.7–7.7)
Neutrophils Relative %: 89 %
Platelets: 326 10*3/uL (ref 150–400)
RBC: 3.56 MIL/uL — ABNORMAL LOW (ref 3.87–5.11)
RDW: 12.5 % (ref 11.5–15.5)
WBC: 15.4 10*3/uL — ABNORMAL HIGH (ref 4.0–10.5)
nRBC: 0 % (ref 0.0–0.2)

## 2023-11-21 LAB — RESP PANEL BY RT-PCR (RSV, FLU A&B, COVID)  RVPGX2
Influenza A by PCR: NEGATIVE
Influenza B by PCR: NEGATIVE
Resp Syncytial Virus by PCR: NEGATIVE
SARS Coronavirus 2 by RT PCR: NEGATIVE

## 2023-11-21 LAB — I-STAT VENOUS BLOOD GAS, ED
Acid-base deficit: 5 mmol/L — ABNORMAL HIGH (ref 0.0–2.0)
Bicarbonate: 19.7 mmol/L — ABNORMAL LOW (ref 20.0–28.0)
Calcium, Ion: 1.13 mmol/L — ABNORMAL LOW (ref 1.15–1.40)
HCT: 35 % — ABNORMAL LOW (ref 36.0–46.0)
Hemoglobin: 11.9 g/dL — ABNORMAL LOW (ref 12.0–15.0)
O2 Saturation: 52 %
Potassium: 4.4 mmol/L (ref 3.5–5.1)
Sodium: 120 mmol/L — ABNORMAL LOW (ref 135–145)
TCO2: 21 mmol/L — ABNORMAL LOW (ref 22–32)
pCO2, Ven: 34.5 mm[Hg] — ABNORMAL LOW (ref 44–60)
pH, Ven: 7.363 (ref 7.25–7.43)
pO2, Ven: 28 mm[Hg] — CL (ref 32–45)

## 2023-11-21 LAB — COMPREHENSIVE METABOLIC PANEL
ALT: 47 U/L — ABNORMAL HIGH (ref 0–44)
AST: 63 U/L — ABNORMAL HIGH (ref 15–41)
Albumin: 3.9 g/dL (ref 3.5–5.0)
Alkaline Phosphatase: 73 U/L (ref 38–126)
Anion gap: 14 (ref 5–15)
BUN: 37 mg/dL — ABNORMAL HIGH (ref 8–23)
CO2: 19 mmol/L — ABNORMAL LOW (ref 22–32)
Calcium: 8.7 mg/dL — ABNORMAL LOW (ref 8.9–10.3)
Chloride: 87 mmol/L — ABNORMAL LOW (ref 98–111)
Creatinine, Ser: 1.84 mg/dL — ABNORMAL HIGH (ref 0.44–1.00)
GFR, Estimated: 28 mL/min — ABNORMAL LOW (ref >60)
Glucose, Bld: 130 mg/dL — ABNORMAL HIGH (ref 70–99)
Potassium: 4.4 mmol/L (ref 3.5–5.1)
Sodium: 120 mmol/L — ABNORMAL LOW (ref 135–145)
Total Bilirubin: 0.8 mg/dL (ref 0.0–1.2)
Total Protein: 6.7 g/dL (ref 6.5–8.1)

## 2023-11-21 LAB — AMMONIA: Ammonia: <10 umol/L (ref 9–35)

## 2023-11-21 LAB — URINALYSIS, ROUTINE W REFLEX MICROSCOPIC
Bacteria, UA: NONE SEEN
Bilirubin Urine: NEGATIVE
Glucose, UA: 50 mg/dL — AB
Ketones, ur: 5 mg/dL — AB
Leukocytes,Ua: NEGATIVE
Nitrite: NEGATIVE
Protein, ur: 30 mg/dL — AB
Specific Gravity, Urine: 1.013 (ref 1.005–1.030)
pH: 5 (ref 5.0–8.0)

## 2023-11-21 LAB — CBG MONITORING, ED: Glucose-Capillary: 113 mg/dL — ABNORMAL HIGH (ref 70–99)

## 2023-11-21 LAB — RAPID URINE DRUG SCREEN, HOSP PERFORMED
Amphetamines: NOT DETECTED
Barbiturates: NOT DETECTED
Benzodiazepines: NOT DETECTED
Cocaine: NOT DETECTED
Opiates: NOT DETECTED
Tetrahydrocannabinol: NOT DETECTED

## 2023-11-21 LAB — STREP PNEUMONIAE URINARY ANTIGEN: Strep Pneumo Urinary Antigen: NEGATIVE

## 2023-11-21 LAB — OSMOLALITY, URINE: Osmolality, Ur: 233 mosm/kg — ABNORMAL LOW (ref 300–900)

## 2023-11-21 LAB — SODIUM, URINE, RANDOM: Sodium, Ur: 25 mmol/L

## 2023-11-21 LAB — I-STAT CG4 LACTIC ACID, ED: Lactic Acid, Venous: 2.1 mmol/L (ref 0.5–1.9)

## 2023-11-21 LAB — OSMOLALITY: Osmolality: 281 mosm/kg (ref 275–295)

## 2023-11-21 MED ORDER — ACETAMINOPHEN 650 MG RE SUPP: 650.0000 mg | Freq: Four times a day (QID) | RECTAL | Status: DC | PRN

## 2023-11-21 MED ORDER — SODIUM CHLORIDE 0.9 % IV BOLUS
1000.0000 mL | Freq: Once | INTRAVENOUS | Status: AC
  Administered 2023-11-21: 1000 mL via INTRAVENOUS

## 2023-11-21 MED ORDER — ACETAMINOPHEN 325 MG PO TABS
650.0000 mg | ORAL_TABLET | Freq: Four times a day (QID) | ORAL | Status: DC | PRN
  Administered 2023-11-23: 650 mg via ORAL
  Filled 2023-11-21: qty 2

## 2023-11-21 MED ORDER — SODIUM CHLORIDE 0.9 % IV SOLN
2.0000 g | INTRAVENOUS | Status: DC
  Administered 2023-11-22 – 2023-11-23 (×2): 2 g via INTRAVENOUS
  Filled 2023-11-21 (×2): qty 20

## 2023-11-21 MED ORDER — SODIUM CHLORIDE 0.9% FLUSH
3.0000 mL | Freq: Two times a day (BID) | INTRAVENOUS | Status: DC
  Administered 2023-11-21 – 2023-11-24 (×4): 3 mL via INTRAVENOUS

## 2023-11-21 MED ORDER — ONDANSETRON HCL 4 MG/2ML IJ SOLN: 4.0000 mg | Freq: Four times a day (QID) | INTRAMUSCULAR | Status: DC | PRN

## 2023-11-21 MED ORDER — FLUTICASONE FUROATE-VILANTEROL 100-25 MCG/ACT IN AEPB
1.0000 | INHALATION_SPRAY | Freq: Every day | RESPIRATORY_TRACT | Status: DC
  Administered 2023-11-24 – 2023-12-01 (×8): 1 via RESPIRATORY_TRACT
  Filled 2023-11-21 (×3): qty 28

## 2023-11-21 MED ORDER — SODIUM CHLORIDE 0.9 % IV SOLN
2.0000 g | Freq: Once | INTRAVENOUS | Status: AC
  Administered 2023-11-21: 2 g via INTRAVENOUS
  Filled 2023-11-21: qty 20

## 2023-11-21 MED ORDER — HEPARIN SODIUM (PORCINE) 5000 UNIT/ML IJ SOLN
5000.0000 [IU] | Freq: Three times a day (TID) | INTRAMUSCULAR | Status: DC
  Administered 2023-11-21 – 2023-11-22 (×4): 5000 [IU] via SUBCUTANEOUS
  Filled 2023-11-21 (×4): qty 1

## 2023-11-21 MED ORDER — UMECLIDINIUM BROMIDE 62.5 MCG/ACT IN AEPB
1.0000 | INHALATION_SPRAY | Freq: Every day | RESPIRATORY_TRACT | Status: DC
  Administered 2023-11-24 – 2023-12-01 (×8): 1 via RESPIRATORY_TRACT
  Filled 2023-11-21 (×2): qty 7

## 2023-11-21 MED ORDER — METOPROLOL SUCCINATE ER 25 MG PO TB24
25.0000 mg | ORAL_TABLET | Freq: Every day | ORAL | Status: DC
  Administered 2023-11-22: 25 mg via ORAL
  Filled 2023-11-21: qty 1

## 2023-11-21 MED ORDER — ONDANSETRON HCL 4 MG PO TABS: 4.0000 mg | ORAL_TABLET | Freq: Four times a day (QID) | ORAL | Status: DC | PRN

## 2023-11-21 MED ORDER — SODIUM CHLORIDE 0.9 % IV SOLN
100.0000 mg | INTRAVENOUS | Status: DC
  Administered 2023-11-22 – 2023-11-23 (×2): 100 mg via INTRAVENOUS
  Filled 2023-11-21 (×2): qty 100

## 2023-11-21 MED ORDER — ALBUTEROL SULFATE (2.5 MG/3ML) 0.083% IN NEBU: 2.5000 mg | INHALATION_SOLUTION | RESPIRATORY_TRACT | Status: DC | PRN

## 2023-11-21 MED ORDER — SODIUM CHLORIDE 0.9 % IV SOLN
100.0000 mg | Freq: Once | INTRAVENOUS | Status: AC
  Administered 2023-11-21: 100 mg via INTRAVENOUS
  Filled 2023-11-21: qty 100

## 2023-11-21 MED ORDER — NICOTINE 14 MG/24HR TD PT24
14.0000 mg | MEDICATED_PATCH | Freq: Every day | TRANSDERMAL | Status: DC
  Administered 2023-11-21 – 2023-11-22 (×2): 14 mg via TRANSDERMAL
  Filled 2023-11-21 (×2): qty 1

## 2023-11-21 NOTE — ED Provider Notes (Signed)
3:20 PM Care assumed from Dr. Adela Lank.  At time of transfer of care, patient is awaiting results of CT head, urinalysis, and has just had antibiotics ordered for suspected pneumonia.  Patient only admission for altered mental status and delirium in the setting of suspected infection.  Workup revealed hyponatremia and AKI.  Patient was admitted for delirium in the setting of pneumonia and electrolyte disturbance and dehydration.   Clinical Impression: 1. Altered mental status, unspecified altered mental status type   2. Pneumonia due to infectious organism, unspecified laterality, unspecified part of lung   3. AKI (acute kidney injury) (HCC)   4. Hyponatremia     Disposition: Admit  This note was prepared with assistance of Dragon voice recognition software. Occasional wrong-word or sound-a-like substitutions may have occurred due to the inherent limitations of voice recognition software.     Marquan Vokes, Canary Brim, MD 11/21/23 [phone]

## 2023-11-21 NOTE — Telephone Encounter (Signed)
Spoke with [facility] and was informed that they will read off kates note to the pt son.

## 2023-11-21 NOTE — Telephone Encounter (Signed)
   Chief Complaint: Information Only Additional Notes: Called and spoke with son Thayer Ohm. States he arrived at his mom's and has called EMS, who have just arrived. Patient and son heading to ED. No further assistance needed at this time.    Copied from CRM [id number]. Topic: Clinical - Medical Advice >> Nov 21, 2023  9:49 AM Prudencio Pair wrote: Reason for CRM: Pt's son, Thayer Ohm, calling back in regards to the issue he previously called in on regarding his mom. He states he spoke to the nurse yesterday, 11/20/23 & was told to take her to the ED. He states she would not allow him to take her. Thayer Ohm states he is currently on his way to his mom's house now because she will not answer the phone. He wants to know if Dr. Natalia Leatherwood will setup for his mom to have an MRI done over at the hospital today? He states if she doesn't allow him to take her to the ED, then he will call 911 & let the ambulance take her. He stated his mom told him that she's having anxiety & stress & he also mentioned that yesterday when he got there, she had a bruise on her cheek. He stated his mom told him that she fell because she got dizzy. Thayer Ohm states he's needing some help to figure out what is going on with his mom. States his mom is just staring at him & will not answer anything & she just states "delete!" Please give Thayer Ohm a call back ASAP at [phone]. Reason for Disposition  [1] Follow-up call to recent contact AND [2] information only call, no triage required  Answer Assessment - Initial Assessment Questions 1. REASON FOR CALL or QUESTION: "What is your reason for calling today?" or "How can I best help you?" or "What question do you have that I can help answer?"     Called and spoke with son Thayer Ohm. States he arrived at his mom's and has called EMS, who have just arrived. Patient and son heading to ED. No further assistance needed at this time.  Protocols used: Information Only Call - No Triage-A-AH

## 2023-11-21 NOTE — ED Triage Notes (Addendum)
Patient BIB [facility] for Altered Mental Status since monday. Patient recently started abx recently for diverticulitis. Patient is A&O x 4 at baseline 90% RA 170 CBG

## 2023-11-21 NOTE — Telephone Encounter (Signed)
Noted.  Patient has currently checked in the ED and has been evaluated.  Please call patient's son back and let him know that I received his message and will be awaiting an update from the hospital doctors.

## 2023-11-21 NOTE — Telephone Encounter (Signed)
Spoke with [facility] and was informed that they will read off kates note to the pt son.   See other phone note for further documentation

## 2023-11-21 NOTE — ED Provider Notes (Signed)
[facility] DEPARTMENT AT [facility] Provider Note   CSN: [id number] Arrival date & time: 11/21/23  1123     History  Chief Complaint  Patient presents with   Altered Mental Status    Morgan [name] is a 78 y.o. female.  78 yo F with a chief complaints of altered mental status.  Reportedly is been going on for couple days.  She is on antibiotics for possible diverticulitis.  Was found to be much more confused today than it had been and so EMS was called and she was taken here for evaluation.  Patient is able to answer yes and no questions though seems somewhat confused about her answers.  Level 5 caveat altered mental status.        Home Medications Prior to Admission medications   Medication Sig Start Date End Date Taking? Authorizing Provider  albuterol (VENTOLIN HFA) 108 (90 Base) MCG/ACT inhaler INHALE 2 PUFFS BY MOUTH EVERY 4 HOURS AS NEEDED FOR WHEEZE OR FOR SHORTNESS OF BREATH 08/10/22   Doreene Nest, NP  amoxicillin-clavulanate (AUGMENTIN) 875-125 MG tablet Take 1 tablet by mouth 2 (two) times daily. 11/16/23   Doreene Nest, NP  Cholecalciferol 125 MCG (5000 UT) capsule Take 5,000 Units by mouth daily.    [provider]  fluticasone (FLONASE) 50 MCG/ACT nasal spray PLACE 1 SPRAY INTO BOTH NOSTRILS 2 (TWO) TIMES DAILY AS NEEDED FOR ALLERGIES OR RHINITIS. 02/23/23   Doreene Nest, NP  Fluticasone-Umeclidin-Vilant (TRELEGY ELLIPTA) 100-62.5-25 MCG/ACT AEPB Inhale 1 puff into the lungs daily. 09/19/23   Mannam, Colbert Coyer, MD  gabapentin (NEURONTIN) 300 MG capsule Take 300 mg by mouth 2 (two) times daily as needed.    [provider]  hydrALAZINE (APRESOLINE) 50 MG tablet TAKE 1/2 TABLET EVERY MORNING AND 1 FULL TABLET IN THE EVENING FOR BLOOD PRESSURE 06/05/22   Doreene Nest, NP  ipratropium-albuterol (DUONEB) 0.5-2.5 (3) MG/3ML SOLN Take 3 mLs by nebulization every 6 (six) hours as needed. 04/27/22   Mannam, Colbert Coyer, MD   irbesartan (AVAPRO) 300 MG tablet Take 1 tablet (300 mg total) by mouth daily. For blood pressure. 08/08/23   Doreene Nest, NP  metoprolol succinate (TOPROL-XL) 25 MG 24 hr tablet Take 1 tablet (25 mg total) by mouth daily. For blood pressure. 07/25/23   Doreene Nest, NP  Multiple Vitamins-Minerals (MULTIVITAMIN GUMMIES ADULTS) CHEW Chew 2 tablets by mouth daily.     [provider]  omeprazole (PRILOSEC) 20 MG capsule Take 1 capsule by mouth every day for heartburn 07/25/23   Doreene Nest, NP  ondansetron (ZOFRAN-ODT) 4 MG disintegrating tablet Take 1 tablet (4 mg total) by mouth every 8 (eight) hours as needed for nausea or vomiting. 11/16/23   Doreene Nest, NP  polyethylene glycol powder (GLYCOLAX/MIRALAX) powder Mix 17 g into water once to twice daily as needed for constipation. 10/19/17   Doreene Nest, NP  simvastatin (ZOCOR) 40 MG tablet Take 1 tablet (40 mg total) by mouth every evening. For cholesterol. 07/25/23   Doreene Nest, NP  venlafaxine XR (EFFEXOR-XR) 150 MG 24 hr capsule Take 1 capsule (150 mg total) by mouth daily with breakfast. For anxiety and depression 07/25/23   Doreene Nest, NP      Allergies    Hctz [hydrochlorothiazide], Amlodipine, Azithromycin, and Codeine    Review of Systems   Review of Systems  Physical Exam Updated Vital Signs BP (!) 147/64   Pulse 85  Temp 98.2 F (36.8 C) (Oral)   Resp (!) 23   SpO2 97%  Physical Exam Vitals and nursing note reviewed.  Constitutional:      General: She is not in acute distress.    Appearance: She is well-developed. She is not diaphoretic.  HENT:     Head: Normocephalic and atraumatic.  Eyes:     Pupils: Pupils are equal, round, and reactive to light.  Cardiovascular:     Rate and Rhythm: Normal rate and regular rhythm.     Heart sounds: No murmur heard.    No friction rub. No gallop.  Pulmonary:     Effort: Pulmonary effort is normal.     Breath sounds: No  wheezing or rales.  Abdominal:     General: There is no distension.     Palpations: Abdomen is soft.     Tenderness: There is no abdominal tenderness.  Musculoskeletal:        General: No tenderness.     Cervical back: Normal range of motion and neck supple.  Skin:    General: Skin is warm and dry.  Neurological:     Mental Status: She is alert.  Psychiatric:        Behavior: Behavior normal.     ED Results / Procedures / Treatments   Labs (all labs ordered are listed, but only abnormal results are displayed) Labs Reviewed  COMPREHENSIVE METABOLIC PANEL - Abnormal; Notable for the following components:      Result Value   Sodium 120 (*)    Chloride 87 (*)    CO2 19 (*)    Glucose, Bld 130 (*)    BUN 37 (*)    Creatinine, Ser 1.84 (*)    Calcium 8.7 (*)    AST 63 (*)    ALT 47 (*)    GFR, Estimated 28 (*)    All other components within normal limits  CBC WITH DIFFERENTIAL/PLATELET - Abnormal; Notable for the following components:   WBC 15.4 (*)    RBC 3.56 (*)    Hemoglobin 11.9 (*)    HCT 32.9 (*)    MCHC 36.2 (*)    Neutro Abs 13.7 (*)    Abs Immature Granulocytes 0.13 (*)    All other components within normal limits  CBG MONITORING, ED - Abnormal; Notable for the following components:   Glucose-Capillary 113 (*)    All other components within normal limits  I-STAT CG4 LACTIC ACID, ED - Abnormal; Notable for the following components:   Lactic Acid, Venous 2.1 (*)    All other components within normal limits  I-STAT VENOUS BLOOD GAS, ED - Abnormal; Notable for the following components:   pCO2, Ven 34.5 (*)    pO2, Ven 28 (*)    Bicarbonate 19.7 (*)    TCO2 21 (*)    Acid-base deficit 5.0 (*)    Sodium 120 (*)    Calcium, Ion 1.13 (*)    HCT 35.0 (*)    Hemoglobin 11.9 (*)    All other components within normal limits  RESP PANEL BY RT-PCR (RSV, FLU A&B, COVID)  RVPGX2  AMMONIA  RAPID URINE DRUG SCREEN, HOSP PERFORMED  ETHANOL  URINALYSIS, ROUTINE W  REFLEX MICROSCOPIC    EKG EKG Interpretation Date/Time:  Wednesday November 21 2023 11:47:26 EST Ventricular Rate:  102 PR Interval:  144 QRS Duration:  95 QT Interval:  349 QTC Calculation: 455 R Axis:   79  Text Interpretation: Sinus tachycardia No significant  change since last tracing Confirmed by Melene Plan [phone]) on 11/21/2023 12:19:01 PM  Radiology DG Chest Port 1 View Result Date: 11/21/2023 CLINICAL DATA:  Altered mental status EXAM: PORTABLE CHEST 1 VIEW COMPARISON:  08/08/2023.  Chest CT dated 06/29/2023. FINDINGS: Normal sized heart. Tortuous and partially calcified thoracic aorta. Grossly stable left diaphragmatic eventration with interval ill-defined margins due to minimal adjacent airspace opacity. Small right diaphragmatic eventration. Interval mild right basilar linear atelectasis. Stable moderate dextroconvex thoracolumbar scoliosis and midthoracic kyphoplasty material. Diffuse osteopenia. IMPRESSION: 1. Interval mild right basilar linear atelectasis. 2. Interval minimal left basilar atelectasis or pneumonia. Electronically Signed   By: Beckie Salts M.D.   On: 11/21/2023 14:09    Procedures Procedures    Medications Ordered in ED Medications  cefTRIAXone (ROCEPHIN) 2 g in sodium chloride 0.9 % 100 mL IVPB (has no administration in time range)  doxycycline (VIBRAMYCIN) 100 mg in sodium chloride 0.9 % 250 mL IVPB (has no administration in time range)  sodium chloride 0.9 % bolus 1,000 mL (1,000 mLs Intravenous New Bag/Given 11/21/23 1219)    ED Course/ Medical Decision Making/ A&P                                 Medical Decision Making Amount and/or Complexity of Data Reviewed Labs: ordered. Radiology: ordered.   78 yo F with a chief complaint of altered mental status.  Has been persistent over the past 3 days.  Patient lives at home alone and when the son has gone to check on her she had been a bit confused about what was going on.  He was unable to convince  her to seek medical care until today.  Will obtain an altered mental status workup.  She had triggered sepsis protocol with EMS though has no obvious external signs of infection.  Will screen for infection here.  Blood work.  Bolus of IV fluids.  Patient's blood work concerning for dehydration.  Hyponatremia hypochloremia.  AKI.  No hypercarbia.  Glucose is normal.  Ammonia normal.  COVID flu and RSV are negative.  Chest x-ray independently interpreted by me without obvious focal infiltrate or pneumothorax.  Radiology read with possible left basilar pneumonia.  Will start on antibiotics.  Awaiting CT read of the head.  Will need admission.  Mental status mildly improved on repeat assessment.  Signed out to Dr. Rush Landmark, please see their note for further details of care in the ED.  The patients results and plan were reviewed and discussed.   Any x-rays performed were independently reviewed by myself.   Differential diagnosis were considered with the presenting HPI.  Medications  cefTRIAXone (ROCEPHIN) 2 g in sodium chloride 0.9 % 100 mL IVPB (has no administration in time range)  doxycycline (VIBRAMYCIN) 100 mg in sodium chloride 0.9 % 250 mL IVPB (has no administration in time range)  sodium chloride 0.9 % bolus 1,000 mL (1,000 mLs Intravenous New Bag/Given 11/21/23 1219)    Vitals:   11/21/23 1145 11/21/23 1149  BP: (!) 147/64   Pulse: 85   Resp: (!) 23   Temp:  98.2 F (36.8 C)  TempSrc:  Oral  SpO2: 97%     Final diagnoses:  Altered mental status, unspecified altered mental status type            Final Clinical Impression(s) / ED Diagnoses Final diagnoses:  Altered mental status, unspecified altered mental status type  Rx / DC Orders ED Discharge Orders     None         Melene Plan, DO 11/21/23 1527

## 2023-11-21 NOTE — Telephone Encounter (Signed)
See other phone note for further documentation

## 2023-11-21 NOTE — Hospital Course (Addendum)
 Morgan [name] [facility] is a 78 y.o. female with medical history significant for COPD, HTN, HLD, depression/anxiety, tobacco use who is admitted with acute encephalopathy in setting of hyponatremia and AKI.  Went into afib with RVR x 2, the second time with hypotension s/p DCCV, required Levophed.  She was transferred to ICU and started on amiodarone infusion and spontaneously converted to normal sinus rhythm once she is is off of the amiodarone drip she converted back to A-fib with RVR.  Patient was now transitioned to oral amiodarone after her and transferred she converted back to normal sinus rhythm and she was subsequently transferred to Cardiac Telemetry.  Cardiology has now signed off the case however her hospitalization has been complicated by hyponatremia so Nephrology has been consulted for further evaluation and management.  The patient's hyponatremia is persistent and nephrology has added urea and give it being persistently low Nephrology has ordered a dose of Tolvaptan 15 mg x1 today and liberalizing her fluid restriction to 2 Liters. Sodium Chloride tablets stopped now. Yesterday Nephrology increased her Urea to 30 g po BID.   Assessment and Plan:  Acute Delirium, improved  -Patient presenting with 4 days of progressive delirium -Negative head CT as it showed "No acute CT finding. Chronic small vessel ischemic changes of the white matter." -She was clearly altered on admission and throughout the first day of hospitalization, but this appears to be resolved -She was given Seroquel at bedtime x 2 doses -Initial concern was that this was related to hyponatremia but her Na+ improved and now worsened -Delirium possibly related to constipation but now appears to be more likely related to unrecognized afib with RVR -C/w Delirium precautions -Continue to Hold Gabapentin  -With new-onset afib, MRI was ordered and negative for acute CVA -PT/OT consulted and recommending Home Health PT/OT when medically  stable for D/C   New onset Atrial Fibrillation with RVR s/p Conversion to NSR -Patient developed new-onset afib on 2/14 early morning She was given IV Metoprolol -Added Cardizem drip but she did not need it, converted spontaneously Started on heparin -> Eliquis -Echo with mild septal hypertrophy but otherwise unremarkable Cardiology consulted when she had recurrent afib with hypotension on 2/14-15 -Underwent DCCV and started on amiodarone drip in ICU, short period of pressors needed -She is now hemodynamically stable, transitioned off amiodarone drip and to PO on 2/16 -Transferred to cardiac telemetry  -Continue p.o. Amiodarone 400 g p.o. twice daily for 7 days and then 200 mg p.o. daily for 21 days -Restarted her home Metoprolol Succinate 25 mg p.o. 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 -Sodium 120 on admit compared to 128 on 11/16/2023 and previous 134 in August 2024 -Initially Suspected volume depletion given GI losses, +ketonuria on presentation -S/p 1 L normal saline given in the ED -Na+ improved but now fluctuating  -Na+ Trend: Recent Labs  Lab 11/29/23 1139 11/29/23 1704 11/29/23 2222 11/30/23 0239 11/30/23 1153 11/30/23 1755 12/01/23 0229  NA 125* 125* 124* 125* 132* 135 136  -Given lack of improvement with delirium, this appears to be less likely the cause -Added salt tablets on 2/17 and given a bolus on 2/18 with improvement to 120 yesterday -Given her lack of improvement we will consulted  nephrology for further evaluation recommendations and repeating a urine sodium and urine osmolality and urine creatinine; Urine Na+ was 101, Urine Cr was 58, and Urine Osm was 366 -Nephrology recommends starting Urea 30 g p.o.  twice daily and now recommending changing monitoring the sodium levels from every 4 hours every 6 hours today  -Sodium tablets 1 g 3 times daily now Discontinued  -Per nephrology the goal sodium should be anywhere from 132-135 tomorrow AM  -Given that Na+ remained on the lower side, Nephrology oing to give her a dose of Tolvaptan 15 mg po x1 today they are recommending liberalizing her fluid restriction to 2 L because she is being given tolvaptan and are without recommend that she requires more screening going forward  COPD -No current evidence of decompensation  -Continue Trelegy Ellipta substitution with Breo Ellipta plus Incruse Ellipta 1 puff IH daily and continue with Xopenex 0.63 mg neb every 6 as needed for wheezing or shortness of breath SpO2: 94 % O2 Flow Rate (L/min): 2 L/min; Not on Supplemental O2 anymore  Metabolic Acidosis -Mild and Improved.  CO2 is now 22, anion gap is 10, chloride level was 93 -Repeat CMP in a.m.   Hypertension -Continued Hydralazine 25 mg po q12h but Held this AM -Initially Held irbesartan, Toprol XL on admission -Restarted irbesartan on 2/17 and is now being continued on 300 mg po Daily but held this AM -Resume Home Metoprolol Succinate 25 mg po daily but held this AM -Her BP has been poorly controlled continue monitor blood pressures per protocol -Last blood pressure reading was on the softer side at 98/81 this AM but is now improved to 116/81  Hypomagnesemia -Patient's Mag Level Trend: Recent Labs  Lab 11/29/23 0833 11/30/23 0239 12/01/23 0229  MG 1.5* 2.4 2.1  -Replete with IV Mag Sulfate 4 grams yesterday -Continue to Monitor and Replete as Necessary -Repeat Mag in the AM   Hyperlipidemia -C/w Simvastatin 40 mg po Daily    Depression/Anxiety -Hold Home Venlafaxine XR since hyponatremia persists -Will need outpatient further evaluation for medication adjustments -Continue with IV Lorazepam 0.5 to 1 mg every 4 as needed for anxiety    Tobacco Use -Patient reportedly smoking at least 1 pack/day -Continue nicotine patch 7 mg transdermally q. 24h as well as 2 mg of nicotine polacrilex as needed for smoking cessation -Cessation counseling given and will need to continue reinforcement  GERD/GI Prophylaxis -Continue PPI with Pantoprazole 40 mg p.o. daily   Normocytic Anemia  -Hgb/Hct Trend: Recent Labs  Lab 11/25/23 0822 11/26/23 0500 11/27/23 0229 11/28/23 0229 11/29/23 0833 11/30/23 0239 12/01/23 0229  HGB 10.3* 10.9* 10.2* 10.3* 10.1* 9.6* 10.5*  HCT 29.0* 31.0* 28.1* 28.7* 28.1* 27.2* 30.0*  MCV 94.2 94.2 90.9 91.4 90.9 94.1 93.2  -Checked Anemia Panel showed an iron level of 60, UIBC 199, TIBC 259, saturation ratios of 20%, ferritin level 276, and vitamin B12 1399 -Continue to Monitor for S/Sx of Bleeding; No overt bleeding ntoed -Repeat CBC in the AM  Hypoalbuminemia -Patient's Albumin Trending from 2.2-4.3 and today was 2.9 -Continue to Monitor and Trend and repeat CMP in the AM

## 2023-11-21 NOTE — ED Notes (Signed)
Help get patient undressed into a gown on the monitor patient is resting with call bell in reach

## 2023-11-21 NOTE — Telephone Encounter (Signed)
Noted

## 2023-11-21 NOTE — Telephone Encounter (Signed)
Pharmacy Patient Advocate Encounter  Received notification from [facility] [employer] that Prior Authorization for Ondasetron 4 mg ODT  has been DENIED.  Full denial letter will be uploaded to the media tab. See denial reason below.   PA #/Case ID/Reference #: [id number]

## 2023-11-21 NOTE — Telephone Encounter (Signed)
Unable to reach patients son. Unable to leave voicemail, mailbox not set up
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
## 2023-11-22 ENCOUNTER — Encounter (HOSPITAL_COMMUNITY): Payer: Self-pay | Admitting: Internal Medicine

## 2023-11-22 DIAGNOSIS — F05 Delirium due to known physiological condition: Secondary | ICD-10-CM | POA: Diagnosis not present

## 2023-11-22 LAB — COMPREHENSIVE METABOLIC PANEL
ALT: 42 U/L (ref 0–44)
ALT: 39 U/L (ref 0–44)
AST: 60 U/L — ABNORMAL HIGH (ref 15–41)
AST: 51 U/L — ABNORMAL HIGH (ref 15–41)
Albumin: 3.4 g/dL — ABNORMAL LOW (ref 3.5–5.0)
Albumin: 3.3 g/dL — ABNORMAL LOW (ref 3.5–5.0)
Alkaline Phosphatase: 66 U/L (ref 38–126)
Alkaline Phosphatase: 63 U/L (ref 38–126)
Anion gap: 15 (ref 5–15)
Anion gap: 14 (ref 5–15)
BUN: 20 mg/dL (ref 8–23)
BUN: 17 mg/dL (ref 8–23)
CO2: 17 mmol/L — ABNORMAL LOW (ref 22–32)
CO2: 19 mmol/L — ABNORMAL LOW (ref 22–32)
Calcium: 8.6 mg/dL — ABNORMAL LOW (ref 8.9–10.3)
Calcium: 8.6 mg/dL — ABNORMAL LOW (ref 8.9–10.3)
Chloride: 98 mmol/L (ref 98–111)
Chloride: 95 mmol/L — ABNORMAL LOW (ref 98–111)
Creatinine, Ser: 1.03 mg/dL — ABNORMAL HIGH (ref 0.44–1.00)
Creatinine, Ser: 0.79 mg/dL (ref 0.44–1.00)
GFR, Estimated: 56 mL/min — ABNORMAL LOW (ref >60)
GFR, Estimated: >60 mL/min (ref >60)
Glucose, Bld: 86 mg/dL (ref 70–99)
Glucose, Bld: 126 mg/dL — ABNORMAL HIGH (ref 70–99)
Potassium: 3.6 mmol/L (ref 3.5–5.1)
Potassium: 3.6 mmol/L (ref 3.5–5.1)
Sodium: 130 mmol/L — ABNORMAL LOW (ref 135–145)
Sodium: 128 mmol/L — ABNORMAL LOW (ref 135–145)
Total Bilirubin: 0.9 mg/dL (ref 0.0–1.2)
Total Bilirubin: 0.6 mg/dL (ref 0.0–1.2)
Total Protein: 6.2 g/dL — ABNORMAL LOW (ref 6.5–8.1)
Total Protein: 6 g/dL — ABNORMAL LOW (ref 6.5–8.1)

## 2023-11-22 LAB — CBC
HCT: 33.4 % — ABNORMAL LOW (ref 36.0–46.0)
Hemoglobin: 11.6 g/dL — ABNORMAL LOW (ref 12.0–15.0)
MCH: 33 pg (ref 26.0–34.0)
MCHC: 34.7 g/dL (ref 30.0–36.0)
MCV: 94.9 fL (ref 80.0–100.0)
Platelets: 305 10*3/uL (ref 150–400)
RBC: 3.52 MIL/uL — ABNORMAL LOW (ref 3.87–5.11)
RDW: 12.8 % (ref 11.5–15.5)
WBC: 13.8 10*3/uL — ABNORMAL HIGH (ref 4.0–10.5)
nRBC: 0 % (ref 0.0–0.2)

## 2023-11-22 LAB — GLUCOSE, CAPILLARY: Glucose-Capillary: 168 mg/dL — ABNORMAL HIGH (ref 70–99)

## 2023-11-22 LAB — SODIUM: Sodium: 131 mmol/L — ABNORMAL LOW (ref 135–145)

## 2023-11-22 MED ORDER — QUETIAPINE FUMARATE 50 MG PO TABS
50.0000 mg | ORAL_TABLET | Freq: Once | ORAL | Status: AC
  Administered 2023-11-22: 50 mg via ORAL
  Filled 2023-11-22: qty 1

## 2023-11-22 MED ORDER — HYDRALAZINE HCL 50 MG PO TABS
50.0000 mg | ORAL_TABLET | Freq: Every day | ORAL | Status: DC
  Administered 2023-11-22 – 2023-11-23 (×2): 50 mg via ORAL
  Filled 2023-11-22 (×2): qty 1

## 2023-11-22 MED ORDER — HYDRALAZINE HCL 25 MG PO TABS
25.0000 mg | ORAL_TABLET | Freq: Every morning | ORAL | Status: DC
Start: 2023-11-23 — End: 2023-11-24
  Administered 2023-11-23: 25 mg via ORAL
  Filled 2023-11-22 (×2): qty 1

## 2023-11-22 MED ORDER — PANTOPRAZOLE SODIUM 40 MG PO TBEC
40.0000 mg | DELAYED_RELEASE_TABLET | Freq: Every day | ORAL | Status: DC
  Administered 2023-11-22 – 2023-12-01 (×10): 40 mg via ORAL
  Filled 2023-11-22 (×10): qty 1

## 2023-11-22 MED ORDER — BISACODYL 5 MG PO TBEC: 5.0000 mg | DELAYED_RELEASE_TABLET | Freq: Every day | ORAL | Status: DC | PRN

## 2023-11-22 MED ORDER — HYDRALAZINE HCL 25 MG PO TABS
25.0000 mg | ORAL_TABLET | ORAL | Status: DC
Start: 2023-11-22 — End: 2023-11-22

## 2023-11-22 MED ORDER — DOCUSATE SODIUM 100 MG PO CAPS
100.0000 mg | ORAL_CAPSULE | Freq: Two times a day (BID) | ORAL | Status: DC
  Administered 2023-11-22 – 2023-11-30 (×13): 100 mg via ORAL
  Filled 2023-11-22 (×15): qty 1

## 2023-11-22 MED ORDER — POLYETHYLENE GLYCOL 3350 17 G PO PACK
17.0000 g | PACK | Freq: Every day | ORAL | Status: DC
  Administered 2023-11-22 – 2023-11-25 (×3): 17 g via ORAL
  Filled 2023-11-22 (×7): qty 1

## 2023-11-22 MED ORDER — VENLAFAXINE HCL ER 75 MG PO CP24
150.0000 mg | ORAL_CAPSULE | Freq: Every day | ORAL | Status: DC
  Administered 2023-11-23 – 2023-11-28 (×6): 150 mg via ORAL
  Filled 2023-11-22 (×2): qty 1
  Filled 2023-11-22: qty 2
  Filled 2023-11-22 (×2): qty 1
  Filled 2023-11-22: qty 2
  Filled 2023-11-22: qty 1

## 2023-11-22 NOTE — Progress Notes (Signed)
Progress Note   Patient: Morgan [name] [facility] ZOX:[id number] DOB: 07-04-1946 DOA: 11/21/2023     1 DOS: the patient was seen and examined on 11/22/2023   Brief hospital course: Morgan [name] is a 78 y.o. female with medical history significant for COPD, HTN, HLD, depression/anxiety, tobacco use who is admitted with acute encephalopathy in setting of hyponatremia and AKI.  Assessment and Plan:  Delirium Patient presenting with 4 days of progressive delirium Negative head CT She is clearly altered at this time Initial concern was that this was related to hyponatremia but her Na++ improved from 120 to 130 overnight without improvement in delirium Will consider alternative causes including medications (including medication misadventure), infection, constipation Delirium precautions Will add Seroquel 50 mg tonight at 6 pm Hold gabapentin  Hyponatremia Sodium 120 on admit compared to 128 on 11/16/2023 and previous 134 in August 2024 Suspected volume depletion given GI losses +ketonuria on presentation S/p 1 L normal saline given in the ED Repeat sodium was 127 after ~8 hrs; holding further fluids Repeat sodium this AM was 130 without further IVF Given lack of improvement with delirium, this appears to be less likely the cause   Acute kidney injury Creatinine 1.84 on admission Suspect secondary to hypovolemia S/p 1 L normal saline given in the ED, holding further fluids as above Creatinine improved to 1.03, GFR 56 this AM Holding olmesartan.   Abdominal pain Patient with left upper quadrant abdominal pain on admitting exam Recently started on Augmentin for suspected diverticulitis No obvious symptoms this AM  Mild AST/ALT elevation noted on admission, improving - likely related to shock liver in the setting of dehydration Obtain CT abdomen/pelvis without evidence of infection but does show significant constipation Will add bowel regimen   Possible left basilar pneumonia CXR with  question of left basilar pneumonia Given leukocytosis and encephalopathy will continue empiric antibiotics - Ceftriaxone and doxycycline Check procalcitonin level in AM   COPD No current evidence of decompensation  Continue Trelegy Ellipta and albuterol as needed.   Hypertension Continue Toprol-XL, hydralazine Holding irbesartan   Hyperlipidemia Holding statin for now   Depression/anxiety Resume Effexor since hyponatremia is improved   Tobacco use Patient reportedly smoking at least 1 pack/day Nicotine patch provided     Consultants: None  Procedures: None  Antibiotics: Ceftriaxone 2/12- Doxycycline 2/12-    Subjective: Her son reports that she is functional and independent at baseline - she drives, shops, etc.  She has not had as much sleep recently and was treated for diverticulitis but her PCP last week (review of chart, had LLQ pain on 2/7 and she was started on Augmentin).  Starting Monday (2/9), she was delirious and has gotten worse since.  She left her pajamas in the middle of the floor, wasn't answering her daily phone calls from her son, and had repetitive and non-sensical speech.  No other obvious symptoms.  Son denies h/o dementia.  No improvement in delirium after treatment of hyponatremia.  Physical Exam: Vitals:   11/22/23 0722 11/22/23 0913 11/22/23 1115 11/22/23 1123  BP: (!) 148/88  125/77 125/77  Pulse: (!) 107  (!) 117 (!) 117  Resp: (!) 21   (!) 21  Temp:  98.9 F (37.2 C)  98.7 F (37.1 C)  TempSrc:    Oral  SpO2: 100%   100%     Intake/Output Summary (Last 24 hours) at 11/22/2023 1410 Last data filed at 11/22/2023 1119 Gross per 24 hour  Intake 100.92 ml  Output --  Net 100.92 ml   There were no vitals filed for this visit.  Exam:  General:  Appears calm and comfortable and is in NAD, clearly confused  Eyes:  PERRL, EOMI, normal lids, iris ENT:  grossly normal hearing, lips & tongue, mmm Neck:  no LAD, masses or  thyromegaly Cardiovascular:  RR with mild tachycardia. No LE edema.  Respiratory:   CTA bilaterally with no wheezes/rales/rhonchi.  Normal respiratory effort. Abdomen:  soft, NT, ND Skin:  no rash or induration seen on limited exam Musculoskeletal:  grossly normal tone BUE/BLE, good ROM, no bony abnormality Psychiatric:  eccentric mood and affect, speech fluent but mostly nonsensical, AOx1 (could say her name but said the place is 2025 and then it deteriorated from there) Neurologic:  CN 2-12 grossly intact, moves all extremities in coordinated fashion  Data Reviewed: I have reviewed the patient's lab results since admission.  Pertinent labs for today include:  Na++ 130, up from 120 on 2/12, 128 on 2/7 CO2 17 BUN 20/Creatinine 1.03/GFR 56, improved; 14/0.83/69 in 05/2023 AST 60, normal in 11/2022 WBC 13.8 Hgb 11.6 Urine Osm 233    Family Communication: None initially; son was present on second visit  Disposition: Status is: Inpatient Remains inpatient appropriate because: ongoing evaluation and treatment  Planned Discharge Destination:  TBD    Time spent: 50 minutes  Author: Jonah Blue, MD 11/22/2023 2:10 PM  For on call review [email or web address].

## 2023-11-22 NOTE — Plan of Care (Signed)
  Problem: Education: Goal: Knowledge of General Education information will improve Description: Including pain rating scale, medication(s)/side effects and non-pharmacologic comfort measures 11/22/2023 1713 by Osborn Coho, RN Outcome: Progressing 11/22/2023 1713 by Osborn Coho, RN Outcome: Not Progressing   Problem: Health Behavior/Discharge Planning: Goal: Ability to manage health-related needs will improve 11/22/2023 1713 by Osborn Coho, RN Outcome: Progressing 11/22/2023 1713 by Osborn Coho, RN Outcome: Not Progressing   Problem: Clinical Measurements: Goal: Ability to maintain clinical measurements within normal limits will improve 11/22/2023 1713 by Osborn Coho, RN Outcome: Progressing 11/22/2023 1713 by Osborn Coho, RN Outcome: Not Progressing Goal: Will remain free from infection 11/22/2023 1713 by Osborn Coho, RN Outcome: Progressing 11/22/2023 1713 by Osborn Coho, RN Outcome: Not Progressing Goal: Diagnostic test results will improve 11/22/2023 1713 by Osborn Coho, RN Outcome: Progressing 11/22/2023 1713 by Osborn Coho, RN Outcome: Not Progressing Goal: Respiratory complications will improve 11/22/2023 1713 by Osborn Coho, RN Outcome: Progressing 11/22/2023 1713 by Osborn Coho, RN Outcome: Not Progressing Goal: Cardiovascular complication will be avoided 11/22/2023 1713 by Osborn Coho, RN Outcome: Progressing 11/22/2023 1713 by Osborn Coho, RN Outcome: Not Progressing   Problem: Activity: Goal: Risk for activity intolerance will decrease 11/22/2023 1713 by Osborn Coho, RN Outcome: Progressing 11/22/2023 1713 by Osborn Coho, RN Outcome: Not Progressing   Problem: Nutrition: Goal: Adequate nutrition will be maintained 11/22/2023 1713 by Osborn Coho, RN Outcome: Progressing 11/22/2023 1713 by Osborn Coho, RN Outcome: Not  Progressing   Problem: Coping: Goal: Level of anxiety will decrease 11/22/2023 1713 by Osborn Coho, RN Outcome: Progressing 11/22/2023 1713 by Osborn Coho, RN Outcome: Not Progressing   Problem: Elimination: Goal: Will not experience complications related to bowel motility 11/22/2023 1713 by Osborn Coho, RN Outcome: Progressing 11/22/2023 1713 by Osborn Coho, RN Outcome: Not Progressing Goal: Will not experience complications related to urinary retention 11/22/2023 1713 by Osborn Coho, RN Outcome: Progressing 11/22/2023 1713 by Osborn Coho, RN Outcome: Not Progressing   Problem: Pain Managment: Goal: General experience of comfort will improve and/or be controlled 11/22/2023 1713 by Osborn Coho, RN Outcome: Progressing 11/22/2023 1713 by Osborn Coho, RN Outcome: Not Progressing   Problem: Safety: Goal: Ability to remain free from injury will improve 11/22/2023 1713 by Osborn Coho, RN Outcome: Progressing 11/22/2023 1713 by Osborn Coho, RN Outcome: Not Progressing   Problem: Skin Integrity: Goal: Risk for impaired skin integrity will decrease 11/22/2023 1713 by Osborn Coho, RN Outcome: Progressing 11/22/2023 1713 by Osborn Coho, RN Outcome: Not Progressing

## 2023-11-23 ENCOUNTER — Inpatient Hospital Stay (HOSPITAL_COMMUNITY): Payer: PPO

## 2023-11-23 ENCOUNTER — Other Ambulatory Visit (HOSPITAL_COMMUNITY): Payer: Self-pay

## 2023-11-23 ENCOUNTER — Telehealth (HOSPITAL_COMMUNITY): Payer: Self-pay | Admitting: Pharmacy Technician

## 2023-11-23 DIAGNOSIS — I4891 Unspecified atrial fibrillation: Secondary | ICD-10-CM

## 2023-11-23 DIAGNOSIS — F05 Delirium due to known physiological condition: Secondary | ICD-10-CM | POA: Diagnosis not present

## 2023-11-23 LAB — CBC WITH DIFFERENTIAL/PLATELET
Abs Immature Granulocytes: 0.1 10*3/uL — ABNORMAL HIGH (ref 0.00–0.07)
Basophils Absolute: 0 10*3/uL (ref 0.0–0.1)
Basophils Relative: 0 %
Eosinophils Absolute: 0 10*3/uL (ref 0.0–0.5)
Eosinophils Relative: 0 %
HCT: 34.7 % — ABNORMAL LOW (ref 36.0–46.0)
Hemoglobin: 12.1 g/dL (ref 12.0–15.0)
Immature Granulocytes: 1 %
Lymphocytes Relative: 8 %
Lymphs Abs: 1 10*3/uL (ref 0.7–4.0)
MCH: 32.4 pg (ref 26.0–34.0)
MCHC: 34.9 g/dL (ref 30.0–36.0)
MCV: 93 fL (ref 80.0–100.0)
Monocytes Absolute: 1 10*3/uL (ref 0.1–1.0)
Monocytes Relative: 8 %
Neutro Abs: 10.1 10*3/uL — ABNORMAL HIGH (ref 1.7–7.7)
Neutrophils Relative %: 83 %
Platelets: 283 10*3/uL (ref 150–400)
RBC: 3.73 MIL/uL — ABNORMAL LOW (ref 3.87–5.11)
RDW: 12.7 % (ref 11.5–15.5)
WBC: 12.2 10*3/uL — ABNORMAL HIGH (ref 4.0–10.5)
nRBC: 0 % (ref 0.0–0.2)

## 2023-11-23 LAB — PROCALCITONIN: Procalcitonin: 0.15 ng/mL

## 2023-11-23 LAB — TSH: TSH: 0.745 u[IU]/mL (ref 0.350–4.500)

## 2023-11-23 LAB — BASIC METABOLIC PANEL
Anion gap: 15 (ref 5–15)
BUN: 15 mg/dL (ref 8–23)
CO2: 19 mmol/L — ABNORMAL LOW (ref 22–32)
Calcium: 8.7 mg/dL — ABNORMAL LOW (ref 8.9–10.3)
Chloride: 95 mmol/L — ABNORMAL LOW (ref 98–111)
Creatinine, Ser: 0.85 mg/dL (ref 0.44–1.00)
GFR, Estimated: >60 mL/min (ref >60)
Glucose, Bld: 225 mg/dL — ABNORMAL HIGH (ref 70–99)
Potassium: 4.1 mmol/L (ref 3.5–5.1)
Sodium: 129 mmol/L — ABNORMAL LOW (ref 135–145)

## 2023-11-23 LAB — ECHOCARDIOGRAM COMPLETE
Area-P 1/2: 4.06 cm2
Height: 61 in
S' Lateral: 2 cm

## 2023-11-23 LAB — TROPONIN I (HIGH SENSITIVITY)
Troponin I (High Sensitivity): 19 ng/L — ABNORMAL HIGH (ref <18)
Troponin I (High Sensitivity): 27 ng/L — ABNORMAL HIGH (ref <18)

## 2023-11-23 LAB — HEPARIN LEVEL (UNFRACTIONATED): Heparin Unfractionated: 0.15 [IU]/mL — ABNORMAL LOW (ref 0.30–0.70)

## 2023-11-23 MED ORDER — NICOTINE POLACRILEX 2 MG MT GUM: 2.0000 mg | CHEWING_GUM | OROMUCOSAL | Status: DC | PRN

## 2023-11-23 MED ORDER — METOPROLOL TARTRATE 5 MG/5ML IV SOLN
5.0000 mg | Freq: Once | INTRAVENOUS | Status: AC
  Administered 2023-11-23: 5 mg via INTRAVENOUS
  Filled 2023-11-23: qty 5

## 2023-11-23 MED ORDER — TAMSULOSIN HCL 0.4 MG PO CAPS
0.4000 mg | ORAL_CAPSULE | Freq: Every day | ORAL | Status: DC
  Administered 2023-11-23: 0.4 mg via ORAL
  Filled 2023-11-23: qty 1

## 2023-11-23 MED ORDER — DILTIAZEM HCL-DEXTROSE 125-5 MG/125ML-% IV SOLN (PREMIX)
5.0000 mg/h | INTRAVENOUS | Status: DC
  Filled 2023-11-23: qty 125

## 2023-11-23 MED ORDER — METOPROLOL SUCCINATE ER 50 MG PO TB24
50.0000 mg | ORAL_TABLET | Freq: Every day | ORAL | Status: DC
  Administered 2023-11-23: 50 mg via ORAL
  Filled 2023-11-23: qty 1

## 2023-11-23 MED ORDER — HEPARIN BOLUS VIA INFUSION
1500.0000 [IU] | Freq: Once | INTRAVENOUS | Status: AC
  Administered 2023-11-23: 1500 [IU] via INTRAVENOUS
  Filled 2023-11-23: qty 1500

## 2023-11-23 MED ORDER — HEPARIN (PORCINE) 25000 UT/250ML-% IV SOLN
750.0000 [IU]/h | INTRAVENOUS | Status: DC
  Administered 2023-11-23 – 2023-11-24 (×2): 800 [IU]/h via INTRAVENOUS
  Filled 2023-11-23 (×2): qty 250

## 2023-11-23 MED ORDER — NICOTINE 7 MG/24HR TD PT24
7.0000 mg | MEDICATED_PATCH | Freq: Every day | TRANSDERMAL | Status: DC
  Administered 2023-11-23 – 2023-12-01 (×9): 7 mg via TRANSDERMAL
  Filled 2023-11-23 (×9): qty 1

## 2023-11-23 MED ORDER — QUETIAPINE FUMARATE 50 MG PO TABS
50.0000 mg | ORAL_TABLET | Freq: Once | ORAL | Status: AC
  Administered 2023-11-23: 50 mg via ORAL
  Filled 2023-11-23: qty 1

## 2023-11-23 NOTE — Progress Notes (Signed)
   11/23/23 0620  Vitals  Temp 98.3 F (36.8 C)  Temp Source Oral  BP (!) 158/104  MAP (mmHg) 117  BP Location Left Arm  BP Method Automatic  Patient Position (if appropriate) Lying  Pulse Rate (!) 142  Pulse Rate Source Dinamap  Level of Consciousness  Level of Consciousness Alert  MEWS COLOR  MEWS Score Color Yellow  Oxygen Therapy  SpO2 97 %  O2 Device Room Air  Patient Activity (if Appropriate) In bed  Pulse Oximetry Type Intermittent  MEWS Score  MEWS Temp 0  MEWS Systolic 0  MEWS Pulse 3  MEWS RR 0  MEWS LOC 0  MEWS Score 3  Provider Notification  Provider Name/Title Dr. Toniann Fail  Date Provider Notified 11/23/23  Time Provider Notified 0622  Method of Notification Page  Notification Reason Change in status  Provider response At bedside;See new orders  Date of Provider Response 11/23/23   Patient's HR continues to elevate.  Dr. Toniann Fail made aware.  Stat orders obtained.  EKG showed AFIB.  HR as high as 170.  Abdomen again distended.  Bladder scan showed greater than 240 cc.  Difficult I & O catheterization.  Per Dr. Toniann Fail ok to put Foley Catheter in.  50 mg PO Metoprolol given and 5 mg IV Metoprolol given.  Will continue to monitor patient.  Bernie Covey RN

## 2023-11-23 NOTE — Progress Notes (Signed)
PHARMACY - ANTICOAGULATION CONSULT NOTE  Pharmacy Consult for heparin drip Indication: atrial fibrillation  Allergies  Allergen Reactions   Hctz [Hydrochlorothiazide] Other (See Comments)    Hyponatremia    Amlodipine Swelling    Ankle/leg swelling   Azithromycin Nausea And Vomiting   Codeine Itching    Makes me crazy    Patient Measurements: Height: 5\' 1"  (154.9 cm) IBW/kg (Calculated) : 47.8 Heparin Dosing Weight: 56.7 kg  Vital Signs: Temp: 97.4 F (36.3 C) (02/14 0830) Temp Source: Oral (02/14 0830) BP: 117/92 (02/14 0830) Pulse Rate: 80 (02/14 0956)  Labs: Recent Labs    11/21/23 1142 11/21/23 1227 11/22/23 0514 11/22/23 1618 11/23/23 0734 11/23/23 0900 11/23/23 1550  HGB 11.9* 11.9* 11.6*  --  12.1  --   --   HCT 32.9* 35.0* 33.4*  --  34.7*  --   --   PLT 326  --  305  --  283  --   --   HEPARINUNFRC  --   --   --   --   --   --  0.15*  CREATININE 1.84*  --  1.03* 0.79  --  0.85  --   TROPONINIHS  --   --   --   --  19* 27*  --     Estimated Creatinine Clearance: 42.5 mL/min (by C-G formula based on SCr of 0.85 mg/dL).   Assessment: 78 yo female presenting with encephalopathy in the setting of hyponatremia and AKI. No anticoagulation prior to admit. Has been receiving SQH for VTE prophylaxis - last dose 2/13 @ 2200. Patient has been tachycardic overnight into Afib w/ RVR. Pharmacy has been consulted to dose IV heparin for atrial fibrillation.  Heparin level subtherapeutic (0.15) on infusion at 800 units/hr. No issues with line or bleeding reported per RN.  Goal of Therapy:  Heparin level 0.3-0.7 units/ml Monitor platelets by anticoagulation protocol: Yes   Plan:  Bolus heparin 1500 units Increase heparin drip to 950 units/hr F/u 8 hr heparin level  Christoper Fabian, PharmD, BCPS Please see amion for complete clinical pharmacist phone list 11/23/2023 5:03 PM

## 2023-11-23 NOTE — Telephone Encounter (Signed)
Patient Product/process development scientist completed.    The patient is insured through [employer]/ [employer]. Patient has Medicare and is not eligible for a copay card, but may be able to apply for patient assistance or Medicare RX Payment Plan (Patient Must reach out to their plan, if eligible for payment plan), if available.    Ran test claim for Eliquis 5 mg and the current 30 day co-pay is $47.00.  Ran test claim for Xarelto 20 mg and the current 30 day co-pay is $47.00.  This test claim was processed through [facility]- copay amounts may vary at other pharmacies due to pharmacy/plan contracts, or as the patient moves through the different stages of their insurance plan.     Roland Earl, CPHT Pharmacy Technician III Certified Patient Advocate [facility] Pharmacy Patient Advocate Team Direct Number: [phone]  Fax: [phone]

## 2023-11-23 NOTE — TOC CM/SW Note (Signed)
Transition of Care [facility]) - Inpatient Brief Assessment   Patient Details  Name: Morgan [name] MRN: [id number] Date of Birth: [birth year]  Transition of Care [facility]) CM/SW Contact:    Tom-Johnson, Hershal Coria, RN Phone Number: 11/23/2023, 3:52 PM   Clinical Narrative:  Patient presented to the ED with Altered Mental Status, had N/V and Diarrhea past few days. Admitted with Delirium.  Patient on Heparin gtt for new onset of A-Fib RVR.   CM spoke with patient at bedside. Patient answered all questions adequately.  From home alone, has one supportive son. Independent with care and drive self sometimes. States she has all DME's at home.  PCP is Doreene Nest, NP and uses [facility] on [address] in [place].   No TOC needs or recommendations noted at this time.  Patient not Medically ready for discharge.  CM will continue to follow as patient progresses with care towards discharge.                  Transition of Care Asessment: Insurance and Status: Insurance coverage has been reviewed Patient has primary care physician: Yes Home environment has been reviewed: Yes Prior level of function:: Modified Independent. Prior/Current Home Services: No current home services Social Drivers of Health Review: SDOH reviewed no interventions necessary Readmission risk has been reviewed: Yes Transition of care needs: no transition of care needs at this time

## 2023-11-23 NOTE — Progress Notes (Signed)
Progress Note   Patient: Morgan [name] [facility] HYQ:[id number] DOB: 1946-01-23 DOA: 11/21/2023     2 DOS: the patient was seen and examined on 11/23/2023   Brief hospital course: Ciara Kagan is a 78 y.o. female with medical history significant for COPD, HTN, HLD, depression/anxiety, tobacco use who is admitted with acute encephalopathy in setting of hyponatremia and AKI.  Assessment and Plan:  Delirium Patient presenting with 4 days of progressive delirium Negative head CT She was clearly altered on admission and throughout the first day of hospitalization, but this is significantly improving She was given one dose of Seroquel on 2/13 pm, will hold for now Initial concern was that this was related to hyponatremia but her Na++ improved from 120 to 130 overnight without improvement in delirium Will consider alternative causes including medications (including medication misadventure), infection, constipation Delirium precautions Hold gabapentin With new-onset afib, MRI was ordered and negative for acute CVA  New onset afib Patient developed new-onset afib on 2/14 early morning She was given IV metoprolol Added Cardizem drip but she did not need it, converted spontaneously Started on heparin, will transition to Eliquis after MRI is read Echo with mild septal hypertrophy but otherwise unremarkable May need cardiology consult  Urinary retention Required I/O cath overnight on 2/14 x 2 Foley placed Will start tamsulosin Will do voiding trial prior to dc  Hyponatremia Sodium 120 on admit compared to 128 on 11/16/2023 and previous 134 in August 2024 Suspected volume depletion given GI losses +ketonuria on presentation S/p 1 L normal saline given in the ED Na++ is improved, currently up to  Given lack of improvement with delirium, this appears to be less likely the cause   Acute kidney injury Creatinine 1.84 on admission Suspect secondary to hypovolemia S/p 1 L normal saline given in  the ED, holding further fluids as above Resolved Holding olmesartan, can restart if needed   Abdominal pain Patient with left upper quadrant abdominal pain on admitting exam Recently started on Augmentin for suspected diverticulitis No obvious symptoms this AM Mild AST/ALT elevation noted on admission, improving - likely related to shock liver in the setting of dehydration CT abdomen/pelvis without evidence of infection but does show significant constipation Added bowel regimen In discussion with patient/son, it appears that she has been having overflow incontinence at home   Possible left basilar pneumonia CXR with question of left basilar pneumonia Given leukocytosis and encephalopathy will continue empiric antibiotics - Ceftriaxone and doxycycline Check procalcitonin level in AM   COPD No current evidence of decompensation  Continue Trelegy Ellipta and albuterol as needed.   Hypertension Continue Toprol-XL, hydralazine Holding irbesartan   Hyperlipidemia Holding statin for now   Depression/anxiety Resume Effexor since hyponatremia is improved   Tobacco use Patient reportedly smoking at least 1 pack/day Nicotine patch provided         Consultants: None   Procedures: None   Antibiotics: Ceftriaxone 2/12- Doxycycline 2/12-    30 Day Unplanned Readmission Risk Score    Flowsheet Row ED to Hosp-Admission (Current) from 11/21/2023 in [facility] 12M KIDNEY UNIT  30 Day Unplanned Readmission Risk Score (%) 14.95 Filed at 11/23/2023 0801       This score is the patient's risk of an unplanned readmission within 30 days of being discharged (0 -100%). The score is based on dignosis, age, lab data, medications, orders, and past utilization.   Low:  0-14.9   Medium: 15-21.9   High: 22-29.9   Extreme: 30 and above  Subjective: Delirium, while still present today, is drastically better.  She went into afib with RVR overnight, has since converted  back to NSR.     Objective: Vitals:   11/23/23 0956 11/23/23 1710  BP:  (!) 130/55  Pulse: 80 78  Resp:  18  Temp:    SpO2:  98%    Intake/Output Summary (Last 24 hours) at 11/23/2023 1740 Last data filed at 11/23/2023 1511 Gross per 24 hour  Intake 643.21 ml  Output 1150 ml  Net -506.79 ml   There were no vitals filed for this visit.  Exam:  General:  Appears calm and comfortable and is in NAD, clearly confused  Eyes:  PERRL, EOMI, normal lids, iris ENT:  grossly normal hearing, lips & tongue, mmm Neck:  no LAD, masses or thyromegaly Cardiovascular:  RR with mild tachycardia. No LE edema.  Respiratory:   CTA bilaterally with no wheezes/rales/rhonchi.  Normal respiratory effort. Abdomen:  soft, NT, ND Skin:  no rash or induration seen on limited exam Musculoskeletal:  grossly normal tone BUE/BLE, good ROM, no bony abnormality Psychiatric:  eccentric mood and affect, speech fluent but mostly nonsensical, AOx1 (could say her name but said the place is 2025 and then it deteriorated from there) Neurologic:  CN 2-12 grossly intact, moves all extremities in coordinated fashion  Data Reviewed: I have reviewed the patient's lab results since admission.  Pertinent labs for today include:   Na++ 129 Glucose 225 WBC 12.2 HS troponin 19/27 Procalcitonin 0.15 TSH 0.745    Family Communication: Son was present at time of evaluation and I also reported MRI results to him by telephone this afternoon  Disposition: Status is: Inpatient Remains inpatient appropriate because: ongoing management     Time spent: 50 minutes  Unresulted Labs (From admission, onward)     Start     Ordered   11/25/23 0500  Heparin level (unfractionated)  Daily,   R      11/23/23 1706   11/24/23 0500  Basic metabolic panel  Tomorrow morning,   R        11/23/23 0810   11/24/23 0100  Heparin level (unfractionated)  Once-Timed,   TIMED        11/23/23 1706   11/23/23 0725  CBC with  Differential/Platelet  Daily,   R      11/23/23 0724   11/21/23 2008  Legionella Pneumophila Serogp 1 Ur Ag  Once,   R        11/21/23 2008             Author: Jonah Blue, MD 11/23/2023 5:40 PM  For on call review [email or web address].

## 2023-11-23 NOTE — Progress Notes (Signed)
   11/23/23 0248  Provider Notification  Provider Name/Title Dr. Toniann Fail  Date Provider Notified 11/22/13  Time Provider Notified [phone]  Method of Notification Page  Notification Reason Change in status (Ptatient has not urinated since approximately 2100.  Bladder scan shows >618 cc in bladder.  Abdomen distended)  Provider response See new orders  Date of Provider Response 11/23/23  Time of Provider Response  (Do I & O Cath)   Patient placed on Purwick at 2000.  At that time, she was in a brief that was soaked with urine.  Patient cleaned and Purwick placed.  By 0230, patient had not urinated.  Abdomen was distended and tender to palpitation.  Bladder scan showed greater than 618 cc.  Dr. Toniann Fail made aware.  Order received for I & O Catheterization.  This was done and 1150 cc urine was drained.  Patient tolerated procedure well.  New Purewick placed.  Will continue to monitor patient.  Bernie Covey RN

## 2023-11-23 NOTE — Significant Event (Signed)
Patient's nurse notified me that patient is tachycardic and monitor shows A-fib with RVR.  On exam at bedside patient is not in distress.  I reviewed patient's labs and notes.  Patient originally admitted for acute delirium in the setting of hyponatremia has also been found to have urinary retention last evening for which a Foley catheter has been placed after In-N-Out cath still showing persistent urinary retention.  Blood pressure is 158/100.  Pulse is 144-minute irregular monitor showing A-fib.  EKG showing A-fib with RVR.  Temperature 98.3 respiration 18/min Patient is alert awake and following commands.  Chest bilateral air entry is present. Heart S1-S2 heard tachycardic.  Assessment and plan -    New onset A-fib with RVR.  I ordered 5 mg IV metoprolol.  Increased patient's metoprolol regular dose from 25 mg to 50 mg.  Will check TSH 2D echo troponins.  Will discuss with oncoming hospitalist.  Updated patient's son Laresa Oshiro.  Midge Minium.
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
## 2023-11-23 NOTE — Progress Notes (Signed)
   11/22/23 2331  Assess: MEWS Score  Temp 98.5 F (36.9 C)  BP (!) 141/121  MAP (mmHg) 127  Pulse Rate (!) 114  Level of Consciousness Alert  SpO2 100 %  O2 Device Room Air  Assess: MEWS Score  MEWS Temp 0  MEWS Systolic 0  MEWS Pulse 2  MEWS RR 1  MEWS LOC 0  MEWS Score 3  MEWS Score Color Yellow  Assess: if the MEWS score is Yellow or Red  Were vital signs accurate and taken at a resting state? No, vital signs rechecked (Pt is very restless and unable to be still.  A & O x 1)  Does the patient meet 2 or more of the SIRS criteria? Yes  Does the patient have a confirmed or suspected source of infection? Yes  MEWS guidelines implemented  Yes, yellow  Treat  MEWS Interventions Considered administering scheduled or prn medications/treatments as ordered  Take Vital Signs  Increase Vital Sign Frequency  Yellow: Q2hr x1, continue Q4hrs until patient remains green for 12hrs  Escalate  MEWS: Escalate Yellow: Discuss with charge nurse and consider notifying provider and/or RRT  Notify: Charge Nurse/RN  Name of Charge Nurse/RN Notified Dorinda Hill RN  Provider Notification  Provider Name/Title Dr. Toniann Fail  Date Provider Notified 11/22/23  Time Provider Notified 2330  Method of Notification Page  Notification Reason Change in status  Provider response See new orders  Date of Provider Response 11/22/23  Assess: SIRS CRITERIA  SIRS Temperature  0  SIRS Respirations  1  SIRS Pulse 1  SIRS WBC 0  SIRS Score Sum  2   Patient is currently in Yellow MEWS.  Per review of telemetry, HR as high as 169.  At 2338, B/P 141/121 (MAP 127).  EKG showed ST with PVC.  Patient remains very restless and fidgety.  Dr. Toniann Fail made aware.  Order received for 5 MG IV Metoprolol - given @ 0010.  At 0101, VS rechecked.  B/P 99/70 (MAP 78), HR 109.  Patient remains restless.  Will continue to monitor patient.  Bernie Covey RN

## 2023-11-24 DIAGNOSIS — N179 Acute kidney failure, unspecified: Secondary | ICD-10-CM | POA: Diagnosis not present

## 2023-11-24 DIAGNOSIS — K219 Gastro-esophageal reflux disease without esophagitis: Secondary | ICD-10-CM

## 2023-11-24 DIAGNOSIS — E785 Hyperlipidemia, unspecified: Secondary | ICD-10-CM

## 2023-11-24 DIAGNOSIS — R579 Shock, unspecified: Secondary | ICD-10-CM | POA: Diagnosis not present

## 2023-11-24 DIAGNOSIS — E860 Dehydration: Secondary | ICD-10-CM | POA: Diagnosis not present

## 2023-11-24 DIAGNOSIS — I4891 Unspecified atrial fibrillation: Secondary | ICD-10-CM

## 2023-11-24 DIAGNOSIS — F05 Delirium due to known physiological condition: Secondary | ICD-10-CM | POA: Diagnosis not present

## 2023-11-24 DIAGNOSIS — F419 Anxiety disorder, unspecified: Secondary | ICD-10-CM

## 2023-11-24 DIAGNOSIS — E871 Hypo-osmolality and hyponatremia: Secondary | ICD-10-CM | POA: Diagnosis not present

## 2023-11-24 DIAGNOSIS — F32A Depression, unspecified: Secondary | ICD-10-CM

## 2023-11-24 DIAGNOSIS — J449 Chronic obstructive pulmonary disease, unspecified: Secondary | ICD-10-CM

## 2023-11-24 DIAGNOSIS — I959 Hypotension, unspecified: Secondary | ICD-10-CM

## 2023-11-24 LAB — COMPREHENSIVE METABOLIC PANEL
ALT: 25 U/L (ref 0–44)
AST: 29 U/L (ref 15–41)
Albumin: 2.2 g/dL — ABNORMAL LOW (ref 3.5–5.0)
Alkaline Phosphatase: 46 U/L (ref 38–126)
Anion gap: 9 (ref 5–15)
BUN: 11 mg/dL (ref 8–23)
CO2: 20 mmol/L — ABNORMAL LOW (ref 22–32)
Calcium: 7.7 mg/dL — ABNORMAL LOW (ref 8.9–10.3)
Chloride: 103 mmol/L (ref 98–111)
Creatinine, Ser: 0.77 mg/dL (ref 0.44–1.00)
GFR, Estimated: >60 mL/min (ref >60)
Glucose, Bld: 151 mg/dL — ABNORMAL HIGH (ref 70–99)
Potassium: 3.5 mmol/L (ref 3.5–5.1)
Sodium: 132 mmol/L — ABNORMAL LOW (ref 135–145)
Total Bilirubin: 0.5 mg/dL (ref 0.0–1.2)
Total Protein: 4.6 g/dL — ABNORMAL LOW (ref 6.5–8.1)

## 2023-11-24 LAB — MRSA NEXT GEN BY PCR, NASAL: MRSA by PCR Next Gen: NOT DETECTED

## 2023-11-24 LAB — LEGIONELLA PNEUMOPHILA SEROGP 1 UR AG: L. pneumophila Serogp 1 Ur Ag: NEGATIVE

## 2023-11-24 LAB — HEPARIN LEVEL (UNFRACTIONATED)
Heparin Unfractionated: 0.5 [IU]/mL (ref 0.30–0.70)
Heparin Unfractionated: 0.56 [IU]/mL (ref 0.30–0.70)
Heparin Unfractionated: 0.71 [IU]/mL — ABNORMAL HIGH (ref 0.30–0.70)

## 2023-11-24 LAB — LACTIC ACID, PLASMA
Lactic Acid, Venous: 1 mmol/L (ref 0.5–1.9)
Lactic Acid, Venous: 0.8 mmol/L (ref 0.5–1.9)

## 2023-11-24 LAB — CORTISOL-AM, BLOOD: Cortisol - AM: 7.4 ug/dL (ref 6.7–22.6)

## 2023-11-24 LAB — TROPONIN I (HIGH SENSITIVITY): Troponin I (High Sensitivity): 9 ng/L (ref <18)

## 2023-11-24 MED ORDER — CHLORHEXIDINE GLUCONATE CLOTH 2 % EX PADS
6.0000 | MEDICATED_PAD | Freq: Every day | CUTANEOUS | Status: DC
  Administered 2023-11-24 – 2023-12-01 (×7): 6 via TOPICAL

## 2023-11-24 MED ORDER — SODIUM CHLORIDE 0.9 % IV BOLUS
500.0000 mL | Freq: Once | INTRAVENOUS | Status: AC
  Administered 2023-11-24: 500 mL via INTRAVENOUS

## 2023-11-24 MED ORDER — ALBUMIN HUMAN 25 % IV SOLN
25.0000 g | Freq: Once | INTRAVENOUS | Status: AC
  Administered 2023-11-24: 25 g via INTRAVENOUS
  Filled 2023-11-24: qty 100

## 2023-11-24 MED ORDER — POTASSIUM CHLORIDE CRYS ER 20 MEQ PO TBCR
40.0000 meq | EXTENDED_RELEASE_TABLET | Freq: Once | ORAL | Status: AC
  Administered 2023-11-24: 40 meq via ORAL
  Filled 2023-11-24: qty 2

## 2023-11-24 MED ORDER — METOPROLOL SUCCINATE ER 25 MG PO TB24: 25.0000 mg | ORAL_TABLET | Freq: Every day | ORAL | Status: DC

## 2023-11-24 MED ORDER — AMIODARONE LOAD VIA INFUSION
150.0000 mg | Freq: Once | INTRAVENOUS | Status: AC
  Administered 2023-11-24: 150 mg via INTRAVENOUS
  Filled 2023-11-24: qty 83.34

## 2023-11-24 MED ORDER — FENTANYL CITRATE PF 50 MCG/ML IJ SOSY
PREFILLED_SYRINGE | INTRAMUSCULAR | Status: AC
  Administered 2023-11-24: 25 ug
  Filled 2023-11-24: qty 1

## 2023-11-24 MED ORDER — NOREPINEPHRINE 4 MG/250ML-% IV SOLN
0.0000 ug/min | INTRAVENOUS | Status: DC
  Administered 2023-11-24: 2 ug/min via INTRAVENOUS
  Filled 2023-11-24: qty 250

## 2023-11-24 MED ORDER — AMIODARONE HCL IN DEXTROSE 360-4.14 MG/200ML-% IV SOLN
30.0000 mg/h | INTRAVENOUS | Status: DC
  Administered 2023-11-24 – 2023-11-25 (×2): 30 mg/h via INTRAVENOUS
  Filled 2023-11-24: qty 200

## 2023-11-24 MED ORDER — SODIUM CHLORIDE 0.9 % IV SOLN
250.0000 mL | INTRAVENOUS | Status: DC
Start: 2023-11-24 — End: 2023-11-24
  Administered 2023-11-24: 250 mL via INTRAVENOUS

## 2023-11-24 MED ORDER — AMIODARONE HCL IN DEXTROSE 360-4.14 MG/200ML-% IV SOLN
30.0000 mg/h | INTRAVENOUS | Status: DC
  Filled 2023-11-24: qty 200

## 2023-11-24 MED ORDER — AMIODARONE HCL IN DEXTROSE 360-4.14 MG/200ML-% IV SOLN
60.0000 mg/h | INTRAVENOUS | Status: AC
  Administered 2023-11-24 (×2): 60 mg/h via INTRAVENOUS
  Filled 2023-11-24: qty 200

## 2023-11-24 MED ORDER — VANCOMYCIN HCL 1250 MG/250ML IV SOLN
1250.0000 mg | Freq: Once | INTRAVENOUS | Status: DC
  Administered 2023-11-24: 1250 mg via INTRAVENOUS
  Filled 2023-11-24: qty 250

## 2023-11-24 MED ORDER — PIPERACILLIN-TAZOBACTAM 3.375 G IVPB: 3.3750 g | Freq: Three times a day (TID) | INTRAVENOUS | Status: DC

## 2023-11-24 MED ORDER — HYDRALAZINE HCL 25 MG PO TABS
25.0000 mg | ORAL_TABLET | Freq: Two times a day (BID) | ORAL | Status: DC
  Administered 2023-11-24 – 2023-12-01 (×13): 25 mg via ORAL
  Filled 2023-11-24 (×13): qty 1

## 2023-11-24 MED ORDER — AMIODARONE HCL 200 MG PO TABS
400.0000 mg | ORAL_TABLET | Freq: Two times a day (BID) | ORAL | Status: DC
  Administered 2023-11-24: 400 mg via ORAL
  Filled 2023-11-24: qty 2

## 2023-11-24 MED ORDER — MAGNESIUM SULFATE 2 GM/50ML IV SOLN
2.0000 g | Freq: Once | INTRAVENOUS | Status: AC
  Administered 2023-11-24: 2 g via INTRAVENOUS
  Filled 2023-11-24: qty 50

## 2023-11-24 MED ORDER — LACTATED RINGERS IV SOLN
INTRAVENOUS | Status: AC
Start: 2023-11-24 — End: 2023-11-25

## 2023-11-24 MED ORDER — AMIODARONE HCL IN DEXTROSE 360-4.14 MG/200ML-% IV SOLN
60.0000 mg/h | INTRAVENOUS | Status: DC
  Administered 2023-11-24: 60 mg/h via INTRAVENOUS
  Filled 2023-11-24: qty 200

## 2023-11-24 MED ORDER — AMIODARONE HCL 200 MG PO TABS: 200.0000 mg | ORAL_TABLET | Freq: Two times a day (BID) | ORAL | Status: DC

## 2023-11-24 MED ORDER — VANCOMYCIN HCL 750 MG/150ML IV SOLN: 750.0000 mg | INTRAVENOUS | Status: DC

## 2023-11-24 MED ORDER — LEVALBUTEROL HCL 0.63 MG/3ML IN NEBU: 0.6300 mg | INHALATION_SOLUTION | Freq: Four times a day (QID) | RESPIRATORY_TRACT | Status: DC | PRN

## 2023-11-24 NOTE — Progress Notes (Signed)
 eLink Physician-Brief Progress Note Patient Name: Morgan [name] [facility] DOB: 11-12-1945 MRN: [id number]   Date of Service  11/24/2023  HPI/Events of Note  Elevated bp. Afib on amiodarone infusion  eICU Interventions  Restart hydralazine Consider restart bblocker in am after discussion with cardiology     Intervention Category Intermediate Interventions: Hypertension - evaluation and management  Henry Russel, P 11/24/2023, 10:07 PM
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
## 2023-11-24 NOTE — Progress Notes (Addendum)
 Pharmacy Antibiotic Note  Morgan [name] is a 78 y.o. female admitted on 11/21/2023 with AMS in setting of AKI/hyponatremia now concerns for sepsis.  Pharmacy has been consulted for zosyn/vancomycin dosing.  -Currently on Ceftriaxone/doxycycline for poss. PNA given CXR -WBC down-trending, but elevated at 12, sCr 0.85 (baseline), afebrile -Resp panel negative, no cultures ordered yet  Plan: -Zosyn 3.375gm IV every 8h -Vancomycin 1250mg  IV x1 -Vancomycin 750mg  IV every 24 hours (AUC 463, Vd 0.72, IBW) -Doxycycline 100mg  IV every 12 hours per MD -Monitor renal function -Follow up signs of clinical improvement, LOT, de-escalation of antibiotics   Height: 5\' 1"  (154.9 cm) IBW/kg (Calculated) : 47.8  Temp (24hrs), Avg:98.2 F (36.8 C), Min:97.4 F (36.3 C), Max:98.9 F (37.2 C)  Recent Labs  Lab 11/21/23 1142 11/21/23 1228 11/22/23 0514 11/22/23 1618 11/23/23 0734 11/23/23 0900  WBC 15.4*  --  13.8*  --  12.2*  --   CREATININE 1.84*  --  1.03* 0.79  --  0.85  LATICACIDVEN  --  2.1*  --   --   --   --     Estimated Creatinine Clearance: 42.5 mL/min (by C-G formula based on SCr of 0.85 mg/dL).    Allergies  Allergen Reactions   Hctz [Hydrochlorothiazide] Other (See Comments)    Hyponatremia    Amlodipine Swelling    Ankle/leg swelling   Azithromycin Nausea And Vomiting   Codeine Itching    Makes me crazy    Antimicrobials this admission: Ceftriaxone/doxy 2/12 >> 2/14 Zosyn 2/15 >>  Vancomycin 2/15 >>  Microbiology results:  Thank you for allowing pharmacy to be a part of this patient's care.  Arabella Merles, PharmD. Clinical Pharmacist 11/24/2023 1:59 AM

## 2023-11-24 NOTE — Plan of Care (Signed)
  Problem: Clinical Measurements: Goal: Ability to maintain clinical measurements within normal limits will improve 11/24/2023 1645 by Rayquon Uselman, Garner Gavel, RN Outcome: Progressing 11/24/2023 1645 by Shireen Quan, RN Outcome: Progressing Goal: Will remain free from infection 11/24/2023 1645 by Shireen Quan, RN Outcome: Progressing 11/24/2023 1645 by Shireen Quan, RN Outcome: Progressing Goal: Diagnostic test results will improve 11/24/2023 1645 by Shireen Quan, RN Outcome: Progressing 11/24/2023 1645 by Shireen Quan, RN Outcome: Progressing Goal: Respiratory complications will improve 11/24/2023 1645 by Shireen Quan, RN Outcome: Progressing 11/24/2023 1645 by Shireen Quan, RN Outcome: Progressing Goal: Cardiovascular complication will be avoided 11/24/2023 1645 by Shireen Quan, RN Outcome: Progressing 11/24/2023 1645 by Shireen Quan, RN Outcome: Progressing   Problem: Activity: Goal: Risk for activity intolerance will decrease 11/24/2023 1645 by Shireen Quan, RN Outcome: Progressing 11/24/2023 1645 by Shireen Quan, RN Outcome: Progressing   Problem: Nutrition: Goal: Adequate nutrition will be maintained 11/24/2023 1645 by Shireen Quan, RN Outcome: Progressing 11/24/2023 1645 by Shireen Quan, RN Outcome: Progressing   Problem: Coping: Goal: Level of anxiety will decrease 11/24/2023 1645 by Shireen Quan, RN Outcome: Progressing 11/24/2023 1645 by Shireen Quan, RN Outcome: Progressing   Problem: Elimination: Goal: Will not experience complications related to bowel motility 11/24/2023 1645 by Shireen Quan, RN Outcome: Progressing 11/24/2023 1645 by Shireen Quan, RN Outcome: Progressing Goal: Will not experience complications related to urinary retention 11/24/2023 1645 by Shireen Quan, RN Outcome: Progressing 11/24/2023 1645 by Shireen Quan, RN Outcome:  Progressing   Problem: Safety: Goal: Ability to remain free from injury will improve 11/24/2023 1645 by Shireen Quan, RN Outcome: Progressing 11/24/2023 1645 by Shireen Quan, RN Outcome: Progressing

## 2023-11-24 NOTE — Significant Event (Signed)
 I was notified by the patient's nurse that patient became hypotensive and was in A-fib with RVR.  On exam at bedside patient is drowsy but easily arousable.  Blood pressure was 60/40 with heart rate in the 130s.  A-fib.  Temperature was 97.4 respiration 18/min.  I reviewed patient's labs and notes and medications.  Patient was admitted for acute encephalopathy cause was not clear MRI brain was negative.  Patient was on empiric antibiotics.  Later yesterday patient went into A-fib with RVR and patient's metoprolol dose was increased.  Plan was to start patient on Cardizem infusion but patient converted to sinus rhythm.  TSH was 0.745.  Troponin was 19 and 27.  2D echo showed EF of 60 to 65%.  I ordered 500 cc normal saline bolus despite which patient's blood pressure was still in the 60s systolic.  Gave 25 mcg of IV fentanyl following which cardioverted with 50 J initially but did not convert and was still hypotensive.  Repeated again cardioversion 120 J.  Still hypotensive and in A-fib RVR.  Discussed with on-call cardiologist Dr.Agha who came at the bedside.  Cardiologist at this time feels patient's hypotension may be related to possible developing sepsis.  Advised starting patient on amiodarone infusion for the A-fib.  Patient is already on heparin infusion.  Patient's antibiotics were broadened with vancomycin and Zosyn.  Repeat blood cultures were ordered along with basic metabolic panel CBC.  Consulted pulmonary critical care.  Patient's son Mr. Rodolfo Notaro was updated.  Midge Minium . MD.

## 2023-11-24 NOTE — Progress Notes (Signed)
 PHARMACY - ANTICOAGULATION CONSULT NOTE  Pharmacy Consult for heparin drip Indication: atrial fibrillation  Allergies  Allergen Reactions   Hctz [Hydrochlorothiazide] Other (See Comments)    Hyponatremia    Amlodipine Swelling    Ankle/leg swelling   Azithromycin Nausea And Vomiting   Codeine Itching    Makes me crazy    Patient Measurements: Height: 5\' 1"  (154.9 cm) IBW/kg (Calculated) : 47.8 Heparin Dosing Weight: 56.7 kg  Vital Signs: Temp: 97.8 F (36.6 C) (02/14 2157) Temp Source: Oral (02/14 2157) BP: 113/73 (02/14 2157) Pulse Rate: 85 (02/14 2157)  Labs: Recent Labs    11/21/23 1142 11/21/23 1227 11/22/23 0514 11/22/23 1618 11/23/23 0734 11/23/23 0900 11/23/23 1550 11/24/23 0040  HGB 11.9* 11.9* 11.6*  --  12.1  --   --   --   HCT 32.9* 35.0* 33.4*  --  34.7*  --   --   --   PLT 326  --  305  --  283  --   --   --   HEPARINUNFRC  --   --   --   --   --   --  0.15* 0.71*  CREATININE 1.84*  --  1.03* 0.79  --  0.85  --   --   TROPONINIHS  --   --   --   --  19* 27*  --   --     Estimated Creatinine Clearance: 42.5 mL/min (by C-G formula based on SCr of 0.85 mg/dL).   Assessment: 78 yo female presenting with encephalopathy in the setting of hyponatremia and AKI. No anticoagulation prior to admit. Has been receiving SQH for VTE prophylaxis - last dose 2/13 @ 2200. Patient has been tachycardic overnight into Afib w/ RVR. Pharmacy has been consulted to dose IV heparin for atrial fibrillation.  Heparin level slightly supra-therapeutic (0.71) on infusion at 950 units/hr and bolus of 1500. No issues with line or bleeding reported per RN. Level drawn appropriately  Goal of Therapy:  Heparin level 0.3-0.7 units/ml Monitor platelets by anticoagulation protocol: Yes   Plan:  Decrease heparin drip to 900 units/hr F/u 8 hr heparin level Daily heparin level and CBC F/u ability to transition to PO [facility]  Arabella Merles, PharmD. Clinical Pharmacist 11/24/2023 12:59  AM

## 2023-11-24 NOTE — Progress Notes (Addendum)
 PHARMACY - ANTICOAGULATION CONSULT NOTE  Pharmacy Consult for heparin drip Indication: atrial fibrillation  Allergies  Allergen Reactions   Hctz [Hydrochlorothiazide] Other (See Comments)    Hyponatremia    Amlodipine Swelling    Ankle/leg swelling   Azithromycin Nausea And Vomiting   Codeine Itching    Makes me crazy    Patient Measurements: Height: 5\' 1"  (154.9 cm) Weight: 57.6 kg (126 lb 15.8 oz) IBW/kg (Calculated) : 47.8 Heparin Dosing Weight: 56.7 kg  Vital Signs: Temp: 98.2 F (36.8 C) (02/15 0800) Temp Source: Oral (02/15 0800) BP: 112/71 (02/15 1045) Pulse Rate: 107 (02/15 1045)  Labs: Recent Labs    11/21/23 1142 11/21/23 1227 11/22/23 0514 11/22/23 1618 11/23/23 0734 11/23/23 0900 11/23/23 1550 11/24/23 0040 11/24/23 0416 11/24/23 0954  HGB 11.9* 11.9* 11.6*  --  12.1  --   --   --   --   --   HCT 32.9* 35.0* 33.4*  --  34.7*  --   --   --   --   --   PLT 326  --  305  --  283  --   --   --   --   --   HEPARINUNFRC  --   --   --   --   --   --  0.15* 0.71*  --  0.56  CREATININE 1.84*  --  1.03* 0.79  --  0.85  --   --  0.77  --   TROPONINIHS  --   --   --   --  19* 27*  --   --  9  --     Estimated Creatinine Clearance: 48.8 mL/min (by C-G formula based on SCr of 0.77 mg/dL).   Assessment: 78 yo female presenting with encephalopathy in the setting of hyponatremia and AKI. No anticoagulation prior to admit. Has been receiving SQH for VTE prophylaxis - last dose 2/13 @ 2200. Patient has been tachycardic overnight into Afib w/ RVR. Pharmacy has been consulted to dose IV heparin for atrial fibrillation.  Heparin level therapeutic at 0.56.  Goal of Therapy:  Heparin level 0.3-0.7 units/ml Monitor platelets by anticoagulation protocol: Yes   Plan:  Continue heparin 900 units/h Daily heparin level and CBC  ADDENDUM 1320: Pt with lots of oozing at line sites, will reduce heparin slightly to 800/h and target goal of 0.3-0.5 for now.  Fredonia Highland, PharmD, BCPS, [facility] Clinical Pharmacist [phone] Please check AMION for all [facility] Pharmacy numbers 11/24/2023

## 2023-11-24 NOTE — Consult Note (Signed)
 NAME:  Morgan [name], MRN:  [id number], DOB:  [birth year], LOS: 3 ADMISSION DATE:  11/21/2023, CONSULTATION DATE: 11/24/2023 REFERRING MD: Jonah Blue, MD, CHIEF COMPLAINT: Afib/RVR   History of Present Illness:  A 78 yr female patient with HTN, COPD, active tobacco use (56 PY), dyslipidemia, depression/anxiety, and GERD who was admitted 3 days ago with N/V/D for 3 days and AMS for one day. She has hypoNa, AKI, and possible PNA on Rocephin and doxycycline. She developed Afib/RVR yesterday am, Rx with BB and heparin drip, converted to sinus. Early am today, she developed Afib/RVR (HR 140) and hypotension. She received 2 DCCV and bolus NS 0.9% 500 cc with better BP, and started on Amiodarone protocol (150 mg IV bolus and then drip). She was transferred to ICU for further Mx. Denied CP, SOB, wheezing, or f/c/r. N/V/D resolved.   Pertinent  Medical History  HTN, mild COPD, dyslipidemia, depression/anxiety, GERD  Significant Hospital Events: Including procedures, antibiotic start and stop dates in addition to other pertinent events   1 L NS 0.9% upon admission Rocephin and doxycycline COVID, flu (A/B), RSV: negative  Interim History / Subjective:    Objective   Blood pressure (!) 90/58, pulse (!) 146, temperature 98.1 F (36.7 C), temperature source Rectal, resp. rate 16, height 5\' 1"  (1.549 m), SpO2 100%.        Intake/Output Summary (Last 24 hours) at 11/24/2023 0258 Last data filed at 11/23/2023 1700 Gross per 24 hour  Intake 53.21 ml  Output 1650 ml  Net -1596.79 ml   There were no vitals filed for this visit.  Examination: General: alert, oriented x4, and comfortable. On 2 L [place], SpO2 100%  HENT: PERL, dry oral mucosa. No LNE or thyromegaly. No JVD Lungs: symmetrical air entry bilaterally. No crackles or wheezing Cardiovascular: NL S1/S2. No m/g/r Abdomen: no distension or tenderness Extremities: no edema. Symmetrical  Neuro: nonfocal    Resolved Hospital Problem list    AKI Metabolic encephalopathy  Assessment & Plan:  Afib/RVR due to dehydration and COPD 11/23/2023, Echo EF 60% -ICU care -Amiodarone protocol -IV heparin drip (CHA2DS2-VASc=4) -Cardiology eval -Serial Trop and LA -IV Mg x1 -IVF -Goal K>4 and Mg>2 -TFT -Am labs -d/c Abx (PCT is negative) -MRSA screen -I/S -Xopenex PRN -LABA/LAMA/ICS  HypoNa -Better -Monitor Na -TFT and Cortisol level  Constipation -Laxatives  Tobacco smoking -Counseling to quit  -Nicotine patch   HTN -Hold BP meds for now  Dyslipidemia -Lipitor  Depression/anxiety -Resume home meds  GERD  -PPI  Best Practice (right click and "Reselect all SmartList Selections" daily)   Diet/type: Regular consistency (see orders) DVT prophylaxis systemic heparin Pressure ulcer(s): N/A GI prophylaxis: PPI Lines: N/A Foley:  N/A Code Status:  full code Last date of multidisciplinary goals of care discussion []   Labs   CBC: Recent Labs  Lab 11/21/23 1142 11/21/23 1227 11/22/23 0514 11/23/23 0734  WBC 15.4*  --  13.8* 12.2*  NEUTROABS 13.7*  --   --  10.1*  HGB 11.9* 11.9* 11.6* 12.1  HCT 32.9* 35.0* 33.4* 34.7*  MCV 92.4  --  94.9 93.0  PLT 326  --  305 283    Basic Metabolic Panel: Recent Labs  Lab 11/21/23 1142 11/21/23 1227 11/21/23 2016 11/22/23 0514 11/22/23 1618 11/23/23 0900  NA 120* 120* 127* 130*  131* 128* 129*  K 4.4 4.4  --  3.6 3.6 4.1  CL 87*  --   --  98 95* 95*  CO2 19*  --   --  17* 19* 19*  GLUCOSE 130*  --   --  86 126* 225*  BUN 37*  --   --  20 17 15   CREATININE 1.84*  --   --  1.03* 0.79 0.85  CALCIUM 8.7*  --   --  8.6* 8.6* 8.7*   GFR: Estimated Creatinine Clearance: 42.5 mL/min (by C-G formula based on SCr of 0.85 mg/dL). Recent Labs  Lab 11/21/23 1142 11/21/23 1228 11/22/23 0514 11/23/23 0734  PROCALCITON  --   --   --  0.15  WBC 15.4*  --  13.8* 12.2*  LATICACIDVEN  --  2.1*  --   --     Liver Function Tests: Recent Labs  Lab  11/21/23 1142 11/22/23 0514 11/22/23 1618  AST 63* 60* 51*  ALT 47* 42 39  ALKPHOS 73 66 63  BILITOT 0.8 0.9 0.6  PROT 6.7 6.2* 6.0*  ALBUMIN 3.9 3.4* 3.3*   No results for input(s): "LIPASE", "AMYLASE" in the last 168 hours. Recent Labs  Lab 11/21/23 1142  AMMONIA <10    ABG    Component Value Date/Time   HCO3 19.7 (L) 11/21/2023 1227   TCO2 21 (L) 11/21/2023 1227   ACIDBASEDEF 5.0 (H) 11/21/2023 1227   O2SAT 52 11/21/2023 1227     Coagulation Profile: No results for input(s): "INR", "PROTIME" in the last 168 hours.  Cardiac Enzymes: No results for input(s): "CKTOTAL", "CKMB", "CKMBINDEX", "TROPONINI" in the last 168 hours.  HbA1C: Hgb A1c MFr Bld  Date/Time Value Ref Range Status  05/29/2023 10:58 AM 5.7 4.6 - 6.5 % Final    Comment:    Glycemic Control Guidelines for People with Diabetes:Non Diabetic:  <6%Goal of Therapy: <7%Additional Action Suggested:  >8%   09/05/2022 11:26 AM 5.7 4.6 - 6.5 % Final    Comment:    Glycemic Control Guidelines for People with Diabetes:Non Diabetic:  <6%Goal of Therapy: <7%Additional Action Suggested:  >8%     CBG: Recent Labs  Lab 11/21/23 1321 11/22/23 1522  GLUCAP 113* 168*    Review of Systems:   Review of Systems  Constitutional:  Negative for chills, diaphoresis, fever, malaise/fatigue and weight loss.  HENT:  Negative for sinus pain and sore throat.   Respiratory:  Positive for cough. Negative for hemoptysis, sputum production, shortness of breath, wheezing and stridor.   Cardiovascular:  Negative for chest pain, palpitations, orthopnea, claudication, leg swelling and PND.  Gastrointestinal:  Positive for constipation. Negative for abdominal pain, blood in stool, diarrhea, heartburn, melena, nausea and vomiting.  Genitourinary:  Positive for dysuria. Negative for frequency and urgency.  Skin:  Negative for itching and rash.     Past Medical History:  She,  has a past medical history of Abdominal wall bulge  (12/16/2018), Acute shoulder pain (01/12/2023), Anxiety, Bronchitis, COPD (chronic obstructive pulmonary disease) (HCC), COPD exacerbation (HCC) (05/05/2019), Dyspnea, GERD (gastroesophageal reflux disease), HOH (hard of hearing), Hyperlipidemia, Hypertension, Neuromuscular disorder (HCC), Osteoporosis, Post herpetic neuralgia (09/05/2022), and Seasonal allergies.   Surgical History:   Past Surgical History:  Procedure Laterality Date   BACK SURGERY  2023   CATARACT EXTRACTION W/PHACO Left 07/23/2019   Procedure: CATARACT EXTRACTION PHACO AND INTRAOCULAR LENS PLACEMENT (IOC) LEFT ponoptix lens 01:05.0  13.6%  8.87;  Surgeon: Lockie Mola, MD;  Location: [facility];  Service: Ophthalmology;  Laterality: Left;   CATARACT EXTRACTION W/PHACO Right 08/13/2019   Procedure: CATARACT EXTRACTION PHACO AND INTRAOCULAR LENS PLACEMENT (IOC) RIGHT PANOPTIX LENS 00:53.0  21.0%  11.25;  Surgeon: Lockie Mola, MD;  Location: [facility];  Service: Ophthalmology;  Laterality: Right;   HAND SURGERY  10/2019   TONSILLECTOMY     TUBAL LIGATION       Social History:   reports that she has been smoking cigarettes. She started smoking about 61 years ago. She has a 56 pack-year smoking history. She has never used smokeless tobacco. She reports current alcohol use of about 6.0 standard drinks of alcohol per week. She reports that she does not use drugs.   Family History:  Her family history includes Heart disease in her father.   Allergies Allergies  Allergen Reactions   Hctz [Hydrochlorothiazide] Other (See Comments)    Hyponatremia    Amlodipine Swelling    Ankle/leg swelling   Azithromycin Nausea And Vomiting   Codeine Itching    Makes me crazy     Home Medications  Prior to Admission medications   Medication Sig Start Date End Date Taking? Authorizing Provider  albuterol (VENTOLIN HFA) 108 (90 Base) MCG/ACT inhaler INHALE 2 PUFFS BY MOUTH EVERY 4 HOURS AS NEEDED FOR  WHEEZE OR FOR SHORTNESS OF BREATH 08/10/22  Yes Doreene Nest, NP  Cholecalciferol 125 MCG (5000 UT) capsule Take 5,000 Units by mouth daily.   Yes [provider]  fluticasone (FLONASE) 50 MCG/ACT nasal spray PLACE 1 SPRAY INTO BOTH NOSTRILS 2 (TWO) TIMES DAILY AS NEEDED FOR ALLERGIES OR RHINITIS. 02/23/23  Yes Doreene Nest, NP  Fluticasone-Umeclidin-Vilant (TRELEGY ELLIPTA) 100-62.5-25 MCG/ACT AEPB Inhale 1 puff into the lungs daily. 09/19/23  Yes Mannam, Praveen, MD  gabapentin (NEURONTIN) 300 MG capsule Take 300 mg by mouth 2 (two) times daily as needed.   Yes [provider]  hydrALAZINE (APRESOLINE) 50 MG tablet TAKE 1/2 TABLET EVERY MORNING AND 1 FULL TABLET IN THE EVENING FOR BLOOD PRESSURE Patient taking differently: Take 25-50 mg by mouth See admin instructions. TAKE 1/2 TABLET EVERY MORNING AND 1 FULL TABLET IN THE EVENING FOR BLOOD PRESSURE 06/05/22  Yes Doreene Nest, NP  ipratropium-albuterol (DUONEB) 0.5-2.5 (3) MG/3ML SOLN Take 3 mLs by nebulization every 6 (six) hours as needed. Patient taking differently: Take 3 mLs by nebulization every 6 (six) hours as needed (for shortness of breath). 04/27/22  Yes Mannam, Praveen, MD  irbesartan (AVAPRO) 300 MG tablet Take 1 tablet (300 mg total) by mouth daily. For blood pressure. 08/08/23  Yes Doreene Nest, NP  metoprolol succinate (TOPROL-XL) 25 MG 24 hr tablet Take 1 tablet (25 mg total) by mouth daily. For blood pressure. 07/25/23  Yes Doreene Nest, NP  Multiple Vitamins-Minerals (MULTIVITAMIN GUMMIES ADULTS) CHEW Chew 2 tablets by mouth daily.    Yes [provider]  omeprazole (PRILOSEC) 20 MG capsule Take 1 capsule by mouth every day for heartburn 07/25/23  Yes Doreene Nest, NP  ondansetron (ZOFRAN-ODT) 4 MG disintegrating tablet Take 1 tablet (4 mg total) by mouth every 8 (eight) hours as needed for nausea or vomiting. 11/16/23  Yes Doreene Nest, NP  polyethylene glycol powder  (GLYCOLAX/MIRALAX) powder Mix 17 g into water once to twice daily as needed for constipation. 10/19/17  Yes Doreene Nest, NP  simvastatin (ZOCOR) 40 MG tablet Take 1 tablet (40 mg total) by mouth every evening. For cholesterol. 07/25/23  Yes Doreene Nest, NP  venlafaxine XR (EFFEXOR-XR) 150 MG 24 hr capsule Take 1 capsule (150 mg total) by mouth daily with breakfast. For anxiety and depression 07/25/23  Yes Doreene Nest,  NP     Critical care time: 55 min

## 2023-11-24 NOTE — Progress Notes (Signed)
 Patient was transferred to ICU overnight due to A-fib with RVR leading to hypotension  She was started on amiodarone but that patient converted to sinus rhythm, once amiodarone infusion was stopped she went back into A-fib with RVR but blood pressure remained stable Cardiology following, recommend reinitiation of amiodarone bolus and infusion.   Patient is clinically stable and ready to be transferred to cardiac telemetry.      Cheri Fowler, MD [place] [facility] See Amion for pager If no response to pager, please call [phone] until 7pm After 7pm, Please call E-link [phone]

## 2023-11-24 NOTE — Significant Event (Signed)
 Rapid Response Event Note   Reason for Call : Afib with RVR and hypotension Initial Focused Assessment:  I was notified by nursing staff of pt with Afib with RVR 115-140 with BPs in the 60s. Pt is arousable and answers some questions but falls back to sleep. Seroquel 50 mg given earlier at 2226. No distress. BBS CTA. Denies CP and SOB. Skin pink warm and dry. Dr. Toniann Fail came to bedside. NS bolus initiated. Pt placed on 2L [place] for sedation/cardioversion.   0058- 98.782F, HR 150 afib, 68/49(57), RR 14 sats 94%  0125-98.782F, HR 124 afib, 66/54, RR 16 with sats 94% on RA  Synchronized cardioversion at 50J.   0127- HR 145 afib, synchronized cardioversion at 120J.  Pt reverted back to afib with RVR.   0152-98.1 F rectal, HR 146 afib, 90/58 (69), RR 16 with sats 100% on 2LNC.    Interventions:  -NS bolus 1L (almost done) -Fentanyl 25 mcg IV  -Synchronized cardioversion 50J, 120J -Amiodarone bolus and gtt -PIV -tx 2H08        MD Notified: Dr. Toniann Fail at bedside Call Time:  0056 Arrival Time: 0105 End Time: 0250  Rose Fillers, RN

## 2023-11-24 NOTE — Progress Notes (Addendum)
 PHARMACY - ANTICOAGULATION CONSULT NOTE  Pharmacy Consult for heparin drip Indication: atrial fibrillation  Allergies  Allergen Reactions   Hctz [Hydrochlorothiazide] Other (See Comments)    Hyponatremia    Amlodipine Swelling    Ankle/leg swelling   Azithromycin Nausea And Vomiting   Codeine Itching    Makes me crazy    Patient Measurements: Height: 5\' 1"  (154.9 cm) Weight: 57.6 kg (126 lb 15.8 oz) IBW/kg (Calculated) : 47.8 Heparin Dosing Weight: 56.7 kg  Vital Signs: Temp: 98.2 F (36.8 C) (02/15 1545) Temp Source: Axillary (02/15 1545) BP: 162/73 (02/15 1930) Pulse Rate: 72 (02/15 1930)  Labs: Recent Labs    11/22/23 0514 11/22/23 1618 11/23/23 0734 11/23/23 0900 11/23/23 1550 11/24/23 0040 11/24/23 0416 11/24/23 0954 11/24/23 1933  HGB 11.6*  --  12.1  --   --   --   --   --   --   HCT 33.4*  --  34.7*  --   --   --   --   --   --   PLT 305  --  283  --   --   --   --   --   --   HEPARINUNFRC  --   --   --   --    < > 0.71*  --  0.56 0.50  CREATININE 1.03* 0.79  --  0.85  --   --  0.77  --   --   TROPONINIHS  --   --  19* 27*  --   --  9  --   --    < > = values in this interval not displayed.    Estimated Creatinine Clearance: 48.8 mL/min (by C-G formula based on SCr of 0.77 mg/dL).   Assessment: 78 yo female presenting with encephalopathy in the setting of hyponatremia and AKI. No anticoagulation prior to admit. Has been receiving SQH for VTE prophylaxis - last dose 2/13 @ 2200. Patient has been tachycardic overnight into Afib w/ RVR. Pharmacy has been consulted to dose IV heparin for atrial fibrillation.  Heparin level therapeutic at 0.50. Patient noted with oozing at line sites earlier today and still noted at bedside tonight  Goal of Therapy:  Heparin level goal 0.3-0.5 Monitor platelets by anticoagulation protocol: Yes   Plan:  -Decrease heparin to 750 units/hr -Daily heparin level and CBC  Harland German, PharmD Clinical  Pharmacist **Pharmacist phone directory can now be found on [email or web address] ([email or web address]).  Listed under [facility].

## 2023-11-24 NOTE — Progress Notes (Signed)
 Per d/w Dr. Shirlee Latch, assign to gen cards, Dr. Flora Lipps updated

## 2023-11-24 NOTE — Progress Notes (Signed)
 Cardiology Progress Note  [name] [facility] MRN: [id number] DOB: 01-Jan-1946 Date of Encounter: 11/24/2023 Primary Cardiologist: None  Subjective   Chief Complaint: Ill-appearing, none  HPI: Converted back to sinus rhythm.  Amiodarone stopped due to concerns for hypotension.  Remains on Levophed.  She will wake up and follow commands.  She seems appropriate.  Acute delirium noted per review of records.  ROS:  All other ROS reviewed and negative. Pertinent positives noted in the HPI.     Telemetry  Overnight telemetry shows sinus rhythm 70 to 80 bpm, which I personally reviewed.   ECG  The most recent ECG shows A-fib heart rate 142, which I personally reviewed.   Physical Exam   Vitals:   11/24/23 0600 11/24/23 0615 11/24/23 0630 11/24/23 0645  BP: (!) 109/55 (!) 115/56 (!) 115/51 (!) 123/52  Pulse: 67 65 66 65  Resp: 14 18 15 14   Temp:      TempSrc:      SpO2: 95% 95% 97% 94%  Weight:      Height:        Intake/Output Summary (Last 24 hours) at 11/24/2023 0820 Last data filed at 11/24/2023 0600 Gross per 24 hour  Intake 2685.03 ml  Output 1300 ml  Net 1385.03 ml       11/24/2023    4:09 AM 11/19/2023    2:04 PM 11/16/2023   10:39 AM  Last 3 Weights  Weight (lbs) 126 lb 15.8 oz 125 lb 125 lb  Weight (kg) 57.6 kg 56.7 kg 56.7 kg    Body mass index is 23.99 kg/m.  General: Well nourished, well developed, in no acute distress Head: Atraumatic, normal size  Eyes: PEERLA, EOMI  Neck: Supple, no JVD Endocrine: No thryomegaly Cardiac: Normal S1, S2; RRR; no murmurs, rubs, or gallops Lungs: Diminished breath sounds bilaterally Abd: Soft, nontender, no hepatomegaly  Ext: No edema, pulses 2+ Musculoskeletal: No deformities, BUE and BLE strength normal and equal Skin: Warm and dry, no rashes   Neuro: Alert, awake, drowsy but will follow commands  Cardiac Studies  TTE 11/23/2023  1. Left ventricular ejection fraction, by estimation, is 60 to 65%. The  left  ventricle has normal function. The left ventricle has no regional  wall motion abnormalities. There is mild left ventricular hypertrophy of  the septal segment. Left ventricular  diastolic parameters are indeterminate.   2. Right ventricular systolic function is normal. The right ventricular  size is normal. There is normal pulmonary artery systolic pressure.   3. The mitral valve is normal in structure. Trivial mitral valve  regurgitation. No evidence of mitral stenosis.   4. The aortic valve is tricuspid. Aortic valve regurgitation is not  visualized. No aortic stenosis is present.   5. The inferior vena cava is normal in size with greater than 50%  respiratory variability, suggesting right atrial pressure of 3 mmHg.   Patient Profile  Morgan [name] is a 78 y.o. female with COPD, hypertension, depression anxiety, tobacco abuse who was admitted on 11/21/2023 with acute encephalopathy in setting of hyponatremia and AKI.  Course has been complicated by acute delirium and possible pneumonia.  She is also had atrial fibrillation with RVR complicated by hypotension that required admission to the ICU overnight.  Currently maintaining sinus rhythm.  Remains on pressors.  Assessment & Plan   # A-fib with RVR -Likely driven by acute critical illness.  Remains in ICU on Levophed.  Being treated for pneumonia and delirium.  ICU  team with concerns for dehydration and volume depletion to explain her hypotension.  She is being fluid resuscitated.  She does not seem to be on antibiotics per my review of her medications. -Lactic acid is normal.  Troponin is negative. -Echo shows normal LV function. -AKI is resolving.  Hyponatremia is also resolving. -For A-fib she is on pressors.  We can only pursue amiodarone.  IV amiodarone was stopped.  She seems to be alert awake we will try her on oral amiodarone.  400 mg twice daily for 7 days followed by 200 mg daily.  We will plan for a short course of amiodarone  for 1 month of therapy while she gets over her critical illness. -She does merit anticoagulation given CHA2DS2-VASc of at least 4.  Continue heparin while critically ill.  Transition to DOAC as you are able. -I do not believe she needs an ischemia evaluation.  She reports no chest pain or trouble breathing.  Her echo shows normal LV function with no wall motion abnormality.  # Hypotension # Shock # Dehydration # Sepsis? -Per CCM.  Seems to be improving.  # AKI # Hyponatremia # Acute delirium -All seem to be improving.  # COPD # Possible pneumonia -Per hospital team.  CRITICAL CARE Performed by: Gerri Spore T O'Neal  Total critical care time: 45 minutes. Critical care time was exclusive of separately billable procedures and treating other patients. Critical care was necessary to treat or prevent imminent or life-threatening deterioration. Critical care was time spent personally by me on the following activities: development of treatment plan with patient and/or surrogate as well as nursing, discussions with consultants, evaluation of patient's response to treatment, examination of patient, obtaining history from patient or surrogate, ordering and performing treatments and interventions, ordering and review of laboratory studies, ordering and review of radiographic studies, pulse oximetry and re-evaluation of patient's condition.   Signed, Lenna Gilford. Flora Lipps, MD, [facility]  [facility]  11/24/2023 8:26 AM        For questions or updates, please contact [facility] Please consult [email or web address] for contact info under

## 2023-11-24 NOTE — Progress Notes (Addendum)
 eLink Physician-Brief Progress Note Patient Name: Aanika [facility] DOB: [birth year] MRN: [id number]   Date of Service  11/24/2023  HPI/Events of Note  78 year old female with a history of COPD, essential hypertension and atrial fibrillation initially presented with delirium and treated for urinary tract infection and hyponatremia.  The patient was transferred to ICU for further management of shock related to A-fib with RVR  Patient is hypotensive and tachycardic saturating 100% on 2 L of oxygen.  Started on amiodarone bolus and continuous infusion.  Results, moderate hyponatremia, hyperglycemia, mild leukocytosis.  Started on a heparin drip.  MRI brain without evidence of stroke.  Echocardiogram with preserved EF.  Net -500 cc for the day, negative -900 cc for this today.  eICU Interventions  Continue amiodarone infusion, metoprolol succinate.  Norepinephrine infusion if sustained blood pressure recently.  MAP of 65  DVT prophylaxis with heparin infusion GI prophylaxis with home PPI   0400 -now sustaining a MAP less than 60  Sinus rhythm with rate control.  Norepinephrine peripherally.  4098 -remains hypotensive on 10 mcg of PIV.  Will extend the range.  Attempt albumin bolus.  Intervention Category Evaluation Type: New Patient Evaluation  Bita Cartwright 11/24/2023, 3:07 AM

## 2023-11-24 NOTE — Progress Notes (Signed)
 Patient's son, Thayer Ohm, called and updated on the events of the night at 0330.  Advised that his mother was now in 2H09.  Bernie Covey RN

## 2023-11-25 DIAGNOSIS — I4891 Unspecified atrial fibrillation: Secondary | ICD-10-CM | POA: Diagnosis not present

## 2023-11-25 DIAGNOSIS — F05 Delirium due to known physiological condition: Secondary | ICD-10-CM | POA: Diagnosis not present

## 2023-11-25 LAB — CBC WITH DIFFERENTIAL/PLATELET
Abs Immature Granulocytes: 0.04 10*3/uL (ref 0.00–0.07)
Basophils Absolute: 0 10*3/uL (ref 0.0–0.1)
Basophils Relative: 0 %
Eosinophils Absolute: 0.1 10*3/uL (ref 0.0–0.5)
Eosinophils Relative: 1 %
HCT: 29 % — ABNORMAL LOW (ref 36.0–46.0)
Hemoglobin: 10.3 g/dL — ABNORMAL LOW (ref 12.0–15.0)
Immature Granulocytes: 0 %
Lymphocytes Relative: 14 %
Lymphs Abs: 1.4 10*3/uL (ref 0.7–4.0)
MCH: 33.4 pg (ref 26.0–34.0)
MCHC: 35.5 g/dL (ref 30.0–36.0)
MCV: 94.2 fL (ref 80.0–100.0)
Monocytes Absolute: 0.5 10*3/uL (ref 0.1–1.0)
Monocytes Relative: 5 %
Neutro Abs: 8.1 10*3/uL — ABNORMAL HIGH (ref 1.7–7.7)
Neutrophils Relative %: 80 %
Platelets: 281 10*3/uL (ref 150–400)
RBC: 3.08 MIL/uL — ABNORMAL LOW (ref 3.87–5.11)
RDW: 12.9 % (ref 11.5–15.5)
WBC: 10.1 10*3/uL (ref 4.0–10.5)
nRBC: 0 % (ref 0.0–0.2)

## 2023-11-25 LAB — BASIC METABOLIC PANEL
Anion gap: 8 (ref 5–15)
BUN: 6 mg/dL — ABNORMAL LOW (ref 8–23)
CO2: 22 mmol/L (ref 22–32)
Calcium: 8.7 mg/dL — ABNORMAL LOW (ref 8.9–10.3)
Chloride: 97 mmol/L — ABNORMAL LOW (ref 98–111)
Creatinine, Ser: 0.93 mg/dL (ref 0.44–1.00)
GFR, Estimated: >60 mL/min (ref >60)
Glucose, Bld: 171 mg/dL — ABNORMAL HIGH (ref 70–99)
Potassium: 4.4 mmol/L (ref 3.5–5.1)
Sodium: 127 mmol/L — ABNORMAL LOW (ref 135–145)

## 2023-11-25 LAB — HEPARIN LEVEL (UNFRACTIONATED): Heparin Unfractionated: 0.48 [IU]/mL (ref 0.30–0.70)

## 2023-11-25 MED ORDER — AMIODARONE HCL 200 MG PO TABS
400.0000 mg | ORAL_TABLET | Freq: Two times a day (BID) | ORAL | Status: DC
  Administered 2023-11-25 – 2023-12-01 (×13): 400 mg via ORAL
  Filled 2023-11-25 (×13): qty 2

## 2023-11-25 MED ORDER — ORAL CARE MOUTH RINSE: 15.0000 mL | OROMUCOSAL | Status: DC | PRN

## 2023-11-25 MED ORDER — AMIODARONE HCL 200 MG PO TABS: 200.0000 mg | ORAL_TABLET | Freq: Every day | ORAL | Status: DC

## 2023-11-25 MED ORDER — APIXABAN 5 MG PO TABS
5.0000 mg | ORAL_TABLET | Freq: Two times a day (BID) | ORAL | Status: DC
  Administered 2023-11-25 – 2023-12-01 (×13): 5 mg via ORAL
  Filled 2023-11-25 (×9): qty 1
  Filled 2023-11-25: qty 2
  Filled 2023-11-25 (×3): qty 1

## 2023-11-25 NOTE — Progress Notes (Addendum)
 PHARMACY - ANTICOAGULATION CONSULT NOTE  Pharmacy Consult for heparin drip Indication: atrial fibrillation  Allergies  Allergen Reactions   Hctz [Hydrochlorothiazide] Other (See Comments)    Hyponatremia    Amlodipine Swelling    Ankle/leg swelling   Azithromycin Nausea And Vomiting   Codeine Itching    Makes me crazy    Patient Measurements: Height: 5\' 1"  (154.9 cm) Weight: 62.2 kg (137 lb 2 oz) IBW/kg (Calculated) : 47.8 Heparin Dosing Weight: 56.7 kg  Vital Signs: Temp: 97.9 F (36.6 C) (02/16 0800) Temp Source: Oral (02/16 0800) BP: 135/89 (02/16 1030) Pulse Rate: 89 (02/16 1030)  Labs: Recent Labs    11/23/23 0734 11/23/23 0900 11/23/23 1550 11/24/23 0416 11/24/23 0954 11/24/23 1933 11/25/23 0215 11/25/23 0822  HGB 12.1  --   --   --   --   --   --  10.3*  HCT 34.7*  --   --   --   --   --   --  29.0*  PLT 283  --   --   --   --   --   --  281  HEPARINUNFRC  --   --    < >  --  0.56 0.50 0.48  --   CREATININE  --  0.85  --  0.77  --   --   --  0.93  TROPONINIHS 19* 27*  --  9  --   --   --   --    < > = values in this interval not displayed.    Estimated Creatinine Clearance: 43.5 mL/min (by C-G formula based on SCr of 0.93 mg/dL).   Assessment: 78 yo female presenting with encephalopathy in the setting of hyponatremia and AKI. No anticoagulation prior to admit. Has been receiving SQH for VTE prophylaxis - last dose 2/13 @ 2200. Patient has been tachycardic overnight into Afib w/ RVR. Pharmacy has been consulted to dose IV heparin for atrial fibrillation.  Heparin level therapeutic at 0.48, CBC ok, oozing at IV sites has improved - will transition to apixaban.  Goal of Therapy:  Heparin level 0.3-0.7 units/ml Monitor platelets by anticoagulation protocol: Yes   Plan:  Stop heparin Apixaban 5mg  BID   Fredonia Highland, PharmD, BCPS, [facility] Clinical Pharmacist [phone] Please check AMION for all [facility] Pharmacy numbers 11/25/2023

## 2023-11-25 NOTE — Progress Notes (Signed)
 Cardiology Progress Note  Patient ID: Neyra [facility] MRN: [id number] DOB: [birth year] Date of Encounter: 11/25/2023 Primary Cardiologist: None  Subjective   Chief Complaint: none.   HPI: Maintaining sinus rhythm.  Much more alert today.  Denies any chest pain or trouble breathing.  ROS:  All other ROS reviewed and negative. Pertinent positives noted in the HPI.     Telemetry  Overnight telemetry shows sinus rhythm 70 to 90 bpm, which I personally reviewed.   Physical Exam   Vitals:   11/25/23 1030 11/25/23 1100 11/25/23 1130 11/25/23 1200  BP: 135/89 (!) 163/81 (!) 153/137 (!) 169/81  Pulse: 89 80 84 83  Resp: (!) 21 (!) 24 (!) 22 18  Temp:   98.2 F (36.8 C)   TempSrc:   Oral   SpO2: 98% 98% 100% 100%  Weight:      Height:        Intake/Output Summary (Last 24 hours) at 11/25/2023 1245 Last data filed at 11/25/2023 1200 Gross per 24 hour  Intake 1246.66 ml  Output 1000 ml  Net 246.66 ml       11/25/2023    5:00 AM 11/24/2023    4:09 AM 11/19/2023    2:04 PM  Last 3 Weights  Weight (lbs) 137 lb 2 oz 126 lb 15.8 oz 125 lb  Weight (kg) 62.2 kg 57.6 kg 56.7 kg    Body mass index is 25.91 kg/m.  General: Well nourished, well developed, in no acute distress Head: Atraumatic, normal size  Eyes: PEERLA, EOMI  Neck: Supple, no JVD Endocrine: No thryomegaly Cardiac: Normal S1, S2; RRR; no murmurs, rubs, or gallops Lungs: Clear to auscultation bilaterally, no wheezing, rhonchi or rales  Abd: Soft, nontender, no hepatomegaly  Ext: No edema, pulses 2+ Musculoskeletal: No deformities, BUE and BLE strength normal and equal Skin: Warm and dry, no rashes   Neuro: Alert and oriented to person, place, time, and situation, CNII-XII grossly intact, no focal deficits  Psych: Normal mood and affect   Cardiac Studies  TTE 11/23/2023  1. Left ventricular ejection fraction, by estimation, is 60 to 65%. The  left ventricle has normal function. The left ventricle has no regional   wall motion abnormalities. There is mild left ventricular hypertrophy of  the septal segment. Left ventricular  diastolic parameters are indeterminate.   2. Right ventricular systolic function is normal. The right ventricular  size is normal. There is normal pulmonary artery systolic pressure.   3. The mitral valve is normal in structure. Trivial mitral valve  regurgitation. No evidence of mitral stenosis.   4. The aortic valve is tricuspid. Aortic valve regurgitation is not  visualized. No aortic stenosis is present.   5. The inferior vena cava is normal in size with greater than 50%  respiratory variability, suggesting right atrial pressure of 3 mmHg.   Patient Profile  Shanay Woolman is a 78 y.o. female with COPD, hypertension, depression anxiety, tobacco abuse who was admitted on 11/21/2023 with acute encephalopathy in setting of hyponatremia and AKI.  Course has been complicated by acute delirium and possible pneumonia.  Cardiology consulted for new onset Afib.   Assessment & Plan   # New onset A-fib with RVR -Triggered by acute delirium hyponatremia and possible infection.  Was being treated with antibiotics but primary team is stopped this.  Condition has improved with fluid resuscitation.  Sodium is improved. -Her mental status is much improved. -Transition to oral amiodarone.  400 mg twice daily for 7  days followed by 200 mg daily for 21 days.  Plan for short course. -On Eliquis 5 mg twice daily.  Should continue this indefinitely. -Echo shows normal LV function.  TSH normal. -No chest pain reported. -Cardiology to follow along.  If she stays in sinus rhythm we will likely just see her as an outpatient.       For questions or updates, please contact [facility] Please consult [email or web address] for contact info under       Signed, Gerri Spore T. Flora Lipps, MD, [facility] [place]  [facility]  11/25/2023 12:45 PM

## 2023-11-25 NOTE — Progress Notes (Signed)
 Progress Note   Patient: Pariss Hommes [facility] NFA:[id number] DOB: Jun 11, 1946 DOA: 11/21/2023     4 DOS: the patient was seen and examined on 11/25/2023   Brief hospital course: Aalyiah Camberos is a 78 y.o. female with medical history significant for COPD, HTN, HLD, depression/anxiety, tobacco use who is admitted with acute encephalopathy in setting of hyponatremia and AKI.  Went into afib with RVR x 2, the second time with hypotension s/p DCCV, required Levophed.  She was transferred to ICU and started on amiodarone infusion.  Assessment and Plan:  Delirium Patient presenting with 4 days of progressive delirium Negative head CT She was clearly altered on admission and throughout the first day of hospitalization, but this appears to be resolved She was given Seroquel at bedtime x 2 doses Initial concern was that this was related to hyponatremia but her Na++ improved from 120 to 130 overnight without improvement in delirium Delirium possibly related to constipation but now appears to be more likely related to unrecognized afib with RVR Delirium precautions Hold gabapentin With new-onset afib, MRI was ordered and negative for acute CVA   New onset afib Patient developed new-onset afib on 2/14 early morning She was given IV metoprolol Added Cardizem drip but she did not need it, converted spontaneously Started on heparin, will transition to Eliquis today Echo with mild septal hypertrophy but otherwise unremarkable Cardiology consulted when she had recurrent afib with hypotension on 2/14-15 Underwent DCCV and started on amiodarone drip in ICU, short period of pressors needed She is now hemodynamically stable, remains on amiodarone drip Will defer to cardiology about when to transition off Transfer to cardiac telemetry today   Urinary retention Required I/O cath overnight on 2/14 x 2 Foley placed Started tamsulosin Will do voiding trial today   Hyponatremia Sodium 120 on admit compared  to 128 on 11/16/2023 and previous 134 in August 2024 Suspected volume depletion given GI losses +ketonuria on presentation S/p 1 L normal saline given in the ED Na++ is improved, currently up to  Given lack of improvement with delirium, this appears to be less likely the cause   Acute kidney injury Creatinine 1.84 on admission Suspect secondary to hypovolemia S/p 1 L normal saline given in the ED, holding further fluids as above Resolved Holding olmesartan, can restart if needed   Abdominal pain Patient with left upper quadrant abdominal pain on admitting exam Recently started on Augmentin for suspected diverticulitis CT abdomen/pelvis with significant constipation Added bowel regimen In discussion with patient/son, it appears that she has been having overflow incontinence at home   Possible left basilar pneumonia CXR with question of left basilar pneumonia Low suspicion of infection Antibiotics stopped   COPD No current evidence of decompensation  Continue Trelegy Ellipta and albuterol as needed.   Hypertension Continue hydralazine Holding irbesartan, Toprol XL Will defer resumption of these medications to cardiology for now   Hyperlipidemia Holding statin for now   Depression/anxiety Resume Effexor since hyponatremia is improved   Tobacco use Patient reportedly smoking at least 1 pack/day Nicotine patch provided         Consultants: PCCM Cardiology   Procedures: Echocardiogram 2/14   Antibiotics: Ceftriaxone 2/12-15 Doxycycline 2/12-15  30 Day Unplanned Readmission Risk Score    Flowsheet Row ED to Hosp-Admission (Current) from 11/21/2023 in [facility] 2H CARDIOVASCULAR ICU  30 Day Unplanned Readmission Risk Score (%) 13.7 Filed at 11/25/2023 0401       This score is the patient's risk of an unplanned  readmission within 30 days of being discharged (0 -100%). The score is based on dignosis, age, lab data, medications, orders, and past utilization.    Low:  0-14.9   Medium: 15-21.9   High: 22-29.9   Extreme: 30 and above           Subjective: Feeling much better, no longer altered, was hoping to go home today.   Objective: Vitals:   11/25/23 1000 11/25/23 1030  BP: 117/63 135/89  Pulse: 90 89  Resp: 17 (!) 21  Temp:    SpO2: 95% 98%    Intake/Output Summary (Last 24 hours) at 11/25/2023 1123 Last data filed at 11/25/2023 1015 Gross per 24 hour  Intake 1217.48 ml  Output 1245 ml  Net -27.52 ml   Filed Weights   11/24/23 0409 11/25/23 0500  Weight: 57.6 kg 62.2 kg    Exam:  General:  Appears calm and comfortable and is in NAD, back to baseline mental status  Eyes:   EOMI, normal lids, iris ENT:  grossly normal hearing, lips & tongue, mmm Neck:  no LAD, masses or thyromegaly Cardiovascular:  RRR. No LE edema.  Respiratory:   CTA bilaterally with no wheezes/rales/rhonchi.  Normal respiratory effort. Abdomen:  soft, NT, ND Skin:  no rash or induration seen on limited exam Musculoskeletal:  grossly normal tone BUE/BLE, good ROM, no bony abnormality Psychiatric:  pleasant mood and affect, speech fluent and appropriate Neurologic:  CN 2-12 grossly intact, moves all extremities in coordinated fashion  Data Reviewed: I have reviewed the patient's lab results since admission.  Pertinent labs for today include:   Na++ 127 Glucose 171 WBC 10.1 Hgb 10.3     Family Communication: None present today  Disposition: Status is: Inpatient Remains inpatient appropriate because: ongoing management of afib      Time spent: 50 minutes  Unresulted Labs (From admission, onward)     Start     Ordered   11/26/23 0500  CBC  Daily,   R     Question:  Specimen collection method  Answer:  Lab=Lab collect   11/25/23 1106   11/26/23 0500  Basic metabolic panel  Tomorrow morning,   R       Question:  Specimen collection method  Answer:  Lab=Lab collect   11/25/23 1123             Author: Jonah Blue,  MD 11/25/2023 11:23 AM  For on call review [email or web address].

## 2023-11-26 DIAGNOSIS — F05 Delirium due to known physiological condition: Secondary | ICD-10-CM | POA: Diagnosis not present

## 2023-11-26 DIAGNOSIS — I4891 Unspecified atrial fibrillation: Secondary | ICD-10-CM | POA: Diagnosis not present

## 2023-11-26 LAB — BASIC METABOLIC PANEL
Anion gap: 10 (ref 5–15)
BUN: <5 mg/dL — ABNORMAL LOW (ref 8–23)
CO2: 21 mmol/L — ABNORMAL LOW (ref 22–32)
Calcium: 8.5 mg/dL — ABNORMAL LOW (ref 8.9–10.3)
Chloride: 88 mmol/L — ABNORMAL LOW (ref 98–111)
Creatinine, Ser: 0.78 mg/dL (ref 0.44–1.00)
GFR, Estimated: >60 mL/min (ref >60)
Glucose, Bld: 133 mg/dL — ABNORMAL HIGH (ref 70–99)
Potassium: 4.4 mmol/L (ref 3.5–5.1)
Sodium: 119 mmol/L — CL (ref 135–145)

## 2023-11-26 LAB — CBC
HCT: 31 % — ABNORMAL LOW (ref 36.0–46.0)
Hemoglobin: 10.9 g/dL — ABNORMAL LOW (ref 12.0–15.0)
MCH: 33.1 pg (ref 26.0–34.0)
MCHC: 35.2 g/dL (ref 30.0–36.0)
MCV: 94.2 fL (ref 80.0–100.0)
Platelets: 258 10*3/uL (ref 150–400)
RBC: 3.29 MIL/uL — ABNORMAL LOW (ref 3.87–5.11)
RDW: 12.5 % (ref 11.5–15.5)
WBC: 9.1 10*3/uL (ref 4.0–10.5)
nRBC: 0 % (ref 0.0–0.2)

## 2023-11-26 MED ORDER — ATORVASTATIN CALCIUM 40 MG PO TABS
40.0000 mg | ORAL_TABLET | Freq: Every day | ORAL | Status: DC
  Administered 2023-11-26 – 2023-12-01 (×6): 40 mg via ORAL
  Filled 2023-11-26 (×6): qty 1

## 2023-11-26 MED ORDER — SODIUM CHLORIDE 1 G PO TABS
1.0000 g | ORAL_TABLET | Freq: Three times a day (TID) | ORAL | Status: AC
  Administered 2023-11-26 – 2023-11-29 (×9): 1 g via ORAL
  Filled 2023-11-26 (×9): qty 1

## 2023-11-26 MED ORDER — SIMVASTATIN 20 MG PO TABS: 40.0000 mg | ORAL_TABLET | Freq: Every evening | ORAL | Status: DC

## 2023-11-26 MED ORDER — IRBESARTAN 300 MG PO TABS
300.0000 mg | ORAL_TABLET | Freq: Every day | ORAL | Status: DC
  Administered 2023-11-26 – 2023-12-01 (×5): 300 mg via ORAL
  Filled 2023-11-26 (×5): qty 1

## 2023-11-26 NOTE — Progress Notes (Signed)
 Pharmacist to MD Communication  PTA simvastatin and new amiodarone have drug interaction  Simvastatin changed to atorvastatin - no drug interaction Last LDL 85    Leota Sauers Pharm.D. CPP, BCPS Clinical Pharmacist [phone] 11/26/2023 12:53 PM

## 2023-11-26 NOTE — Progress Notes (Signed)
 Progress Note   Patient: [name] ZOX:[id number] DOB: 17-Jun-1946 DOA: 11/21/2023     5 DOS: the patient was seen and examined on 11/26/2023   Brief [name] course: Morgan [name] is a 78 y.o. female with medical history significant for COPD, HTN, HLD, depression/anxiety, tobacco use who is admitted with acute encephalopathy in setting of hyponatremia and AKI.  Went into afib with RVR x 2, the second time with hypotension s/p DCCV, required Levophed.  She was transferred to ICU and started on amiodarone infusion.  Assessment and Plan:  Delirium Patient presenting with 4 days of progressive delirium Negative head CT She was clearly altered on admission and throughout the first day of hospitalization, but this appears to be resolved She was given Seroquel at bedtime x 2 doses Initial concern was that this was related to hyponatremia but her Na++ improved from 120 to 130 overnight without improvement in delirium Delirium possibly related to constipation but now appears to be more likely related to unrecognized afib with RVR Delirium precautions Held gabapentin but will restart With new-onset afib, MRI was ordered and negative for acute CVA PT/OT consulted   New onset afib Patient developed new-onset afib on 2/14 early morning She was given IV metoprolol Added Cardizem drip but she did not need it, converted spontaneously Started on heparin -> Eliquis Echo with mild septal hypertrophy but otherwise unremarkable Cardiology consulted when she had recurrent afib with hypotension on 2/14-15 Underwent DCCV and started on amiodarone drip in ICU, short period of pressors needed She is now hemodynamically stable, transitioned off amiodarone drip and to PO on 2/16 Transfer to cardiac telemetry today   Urinary retention Required I/O cath overnight on 2/14 x 2 Foley placed Started tamsulosin Passed voiding trial Suggest outpatient f/u, may be able to transition off tamsulosin in the  near future   Hyponatremia Sodium 120 on admit compared to 128 on 11/16/2023 and previous 134 in August 2024 Suspected volume depletion given GI losses, +ketonuria on presentation S/p 1 L normal saline given in the ED Na++ improved but recurred, 119 today Given lack of improvement with delirium, this appears to be less likely the cause Will add salt tablets on 2/17 and continue to monitor   Acute kidney injury Creatinine 1.84 on admission Suspect secondary to hypovolemia S/p 1 L normal saline given in the ED, holding further fluids as above Resolved   Abdominal pain Patient with left upper quadrant abdominal pain on admitting exam Recently started on Augmentin for suspected diverticulitis CT abdomen/pelvis with significant constipation Added bowel regimen In discussion with patient/son, it appears that she has been having overflow incontinence at home This issue appears to have resolved   COPD No current evidence of decompensation  Continue Trelegy Ellipta and albuterol as needed.   Hypertension Continue hydralazine Holding irbesartan, Toprol XL Will restart irbesartan on 2/17, as her BP has been poorly controlled   Hyperlipidemia Resume simvastatin   Depression/anxiety Resume Effexor since hyponatremia is improved   Tobacco use Patient reportedly smoking at least 1 pack/day Nicotine patch provided Her son is planning to reinforce the importance of her stopping smoking         Consultants: PCCM Cardiology   Procedures: Echocardiogram 2/14   Antibiotics: Ceftriaxone 2/12-15 Doxycycline 2/12-15  30 Day Unplanned Readmission Risk Score    Flowsheet Row ED to Hosp-Admission (Current) from 11/21/2023 in [place] 2H CARDIOVASCULAR ICU  30 Day Unplanned Readmission Risk Score (%) 15.63 Filed at 11/26/2023 0801  This score is the patient's risk of an unplanned readmission within 30 days of being discharged (0 -100%). The score is based on dignosis, age,  lab data, medications, orders, and past utilization.   Low:  0-14.9   Medium: 15-21.9   High: 22-29.9   Extreme: 30 and above           Subjective: Feeling good, no longer confused, really wants to go home.   Objective: Vitals:   11/26/23 1015 11/26/23 1030  BP:  (!) 172/102  Pulse: 90 81  Resp: 20 20  Temp:    SpO2: 98% 98%    Intake/Output Summary (Last 24 hours) at 11/26/2023 1059 Last data filed at 11/26/2023 0600 Gross per 24 hour  Intake 546.97 ml  Output 1200 ml  Net -653.03 ml   Filed Weights   11/24/23 0409 11/25/23 0500 11/26/23 0500  Weight: 57.6 kg 62.2 kg 60.3 kg    Exam:  General:  Appears calm and comfortable and is in NAD, back to baseline mental status  Eyes:   EOMI, normal lids, iris ENT:  grossly normal hearing, lips & tongue, mmm Neck:  no LAD, masses or thyromegaly Cardiovascular:  RRR. No LE edema.  Respiratory:   CTA bilaterally with no wheezes/rales/rhonchi.  Normal respiratory effort. Abdomen:  soft, NT, ND Skin:  no rash or induration seen on limited exam Musculoskeletal:  grossly normal tone BUE/BLE, good ROM, no bony abnormality Psychiatric:  pleasant mood and affect, speech fluent and appropriate Neurologic:  CN 2-12 grossly intact, moves all extremities in coordinated fashion  Data Reviewed: I have reviewed the patient's lab results since admission.  Pertinent labs for today include:   Na++ 119, down from 127 Glucose 133 Stable CBC     Family Communication: None present; I spoke with her son by telephone  Disposition: Status is: Inpatient Remains inpatient appropriate because: ongoing management     Time spent: 50 minutes  Unresulted Labs (From admission, onward)     Start     Ordered   11/27/23 0500  Basic metabolic panel  Tomorrow morning,   R       Question:  Specimen collection method  Answer:  Lab=Lab collect   11/26/23 1056   11/26/23 0500  CBC  Daily,   R     Question:  Specimen collection method  Answer:   Lab=Lab collect   11/25/23 1106             Author: Jonah Blue, MD 11/26/2023 10:59 AM  For on call review [email or web address].

## 2023-11-26 NOTE — Evaluation (Signed)
 Physical Therapy Evaluation Patient Details Name: Morgan [name] MRN: [id number] DOB: [birth year] Today's Date: 11/26/2023  History of Present Illness  Pt is 78 year old presented to [facility] on  11/21/23 for acute encephalopathy in setting of hyponatremia and AKI. Went into afib with rvr. Pt with cardioversion. PMH - copd, htn, chronic back pain, back surgery, depression/ anxiety  Clinical Impression  Pt presents to PT with slightly unsteady gait due to illness and inactivity. Expect pt will make good progress back to baseline with mobility. Will follow acutely but doubt pt will need PT after DC. Pt's son present. He reports he checks on pt frequently via phone and in person as needed.          If plan is discharge home, recommend the following: Assist for transportation   Can travel by private vehicle        Equipment Recommendations None recommended by PT  Recommendations for Other Services       Functional Status Assessment Patient has had a recent decline in their functional status and demonstrates the ability to make significant improvements in function in a reasonable and predictable amount of time.     Precautions / Restrictions Precautions Precautions: Fall Restrictions Weight Bearing Restrictions Per Provider Order: No      Mobility  Bed Mobility               General bed mobility comments: Pt up in chair    Transfers Overall transfer level: Needs assistance Equipment used: Rolling walker (2 wheels), None Transfers: Sit to/from Stand Sit to Stand: Supervision           General transfer comment: for safety and lines    Ambulation/Gait Ambulation/Gait assistance: Supervision, Contact guard assist Gait Distance (Feet): 300 Feet Assistive device: Rolling walker (2 wheels), None Gait Pattern/deviations: Step-through pattern, Decreased stride length Gait velocity: decr Gait velocity interpretation: 1.31 - 2.62 ft/sec, indicative of limited community  ambulator   General Gait Details: supervision when using walker and CGA without walker  Stairs            Wheelchair Mobility     Tilt Bed    Modified Rankin (Stroke Patients Only)       Balance Overall balance assessment: Needs assistance Sitting-balance support: No upper extremity supported, Feet supported Sitting balance-Leahy Scale: Normal     Standing balance support: No upper extremity supported, During functional activity Standing balance-Leahy Scale: Good                               Pertinent Vitals/Pain Pain Assessment Pain Assessment: No/denies pain    Home Living Family/patient expects to be discharged to:: Private residence Living Arrangements: Alone Available Help at Discharge: Family;Available PRN/intermittently Type of Home: House Home Access: Level entry       Home Layout: Two level;Able to live on main level with bedroom/bathroom Home Equipment: Rolling Walker (2 wheels);Rollator (4 wheels);Grab bars - tub/shower;Toilet riser      Prior Function Prior Level of Function : Independent/Modified Independent             Mobility Comments: Uses walker or rollator if her back is bothering her but prmiarily no assistive device       Extremity/Trunk Assessment   Upper Extremity Assessment Upper Extremity Assessment: Defer to OT evaluation    Lower Extremity Assessment Lower Extremity Assessment: Generalized weakness       Communication   Communication Communication:  Impaired Factors Affecting Communication: Hearing impaired    Cognition Arousal: Alert Behavior During Therapy: WFL for tasks assessed/performed   PT - Cognitive impairments: No apparent impairments                         Following commands: Intact       Cueing       General Comments General comments (skin integrity, edema, etc.): VSS on RA    Exercises     Assessment/Plan    PT Assessment Patient needs continued PT services   PT Problem List Decreased strength;Decreased activity tolerance;Decreased mobility       PT Treatment Interventions DME instruction;Gait training;Stair training;Therapeutic activities;Functional mobility training;Therapeutic exercise;Balance training;Patient/family education    PT Goals (Current goals can be found in the Care Plan section)  Acute Rehab PT Goals Patient Stated Goal: go home PT Goal Formulation: With patient Time For Goal Achievement: 12/03/23 Potential to Achieve Goals: Good    Frequency Min 1X/week     Co-evaluation               AM-PAC PT "6 Clicks" Mobility  Outcome Measure Help needed turning from your back to your side while in a flat bed without using bedrails?: None Help needed moving from lying on your back to sitting on the side of a flat bed without using bedrails?: None Help needed moving to and from a bed to a chair (including a wheelchair)?: A Little Help needed standing up from a chair using your arms (e.g., wheelchair or bedside chair)?: A Little Help needed to walk in hospital room?: A Little Help needed climbing 3-5 steps with a railing? : A Little 6 Click Score: 20    End of Session   Activity Tolerance: Patient tolerated treatment well Patient left: in chair;with call bell/phone within reach;with family/visitor present   PT Visit Diagnosis: Muscle weakness (generalized) (M62.81);Other abnormalities of gait and mobility (R26.89)    Time: 9147-8295 PT Time Calculation (min) (ACUTE ONLY): 20 min   Charges:   PT Evaluation $PT Eval Low Complexity: 1 Low   PT General Charges $$ ACUTE PT VISIT: 1 Visit         [facility] PT Acute Rehabilitation Services Office [phone]   Angelina Ok [facility] 11/26/2023, 4:46 PM

## 2023-11-26 NOTE — Progress Notes (Signed)
 Cardiology Progress Note  [name] [facility] MRN: [id number] DOB: [birth year] Date of Encounter: 11/26/2023 Primary Cardiologist: None  Subjective   Chief Complaint: None.   HPI: Maintaining sinus rhythm.  Denies any chest pain or trouble breathing.  ROS:  All other ROS reviewed and negative. Pertinent positives noted in the HPI.     Telemetry  Overnight telemetry shows sinus rhythm 70s, which I personally reviewed.    Physical Exam   Vitals:   11/26/23 0800 11/26/23 0808 11/26/23 0815 11/26/23 0830  BP: (!) 161/81   (!) 159/77  Pulse: 80 80 81 81  Resp: 17 16 15 19   Temp:      TempSrc:      SpO2: 100% 100% 99% 97%  Weight:      Height:        Intake/Output Summary (Last 24 hours) at 11/26/2023 0917 Last data filed at 11/26/2023 0600 Gross per 24 hour  Intake 570.78 ml  Output 1300 ml  Net -729.22 ml       11/26/2023    5:00 AM 11/25/2023    5:00 AM 11/24/2023    4:09 AM  Last 3 Weights  Weight (lbs) 132 lb 15 oz 137 lb 2 oz 126 lb 15.8 oz  Weight (kg) 60.3 kg 62.2 kg 57.6 kg    Body mass index is 25.12 kg/m.  General: Well nourished, well developed, in no acute distress Head: Atraumatic, normal size  Eyes: PEERLA, EOMI  Neck: Supple, no JVD Endocrine: No thryomegaly Cardiac: Normal S1, S2; RRR; no murmurs, rubs, or gallops Lungs: Clear to auscultation bilaterally, no wheezing, rhonchi or rales  Abd: Soft, nontender, no hepatomegaly  Ext: No edema, pulses 2+ Musculoskeletal: No deformities, BUE and BLE strength normal and equal Skin: Warm and dry, no rashes   Neuro: Alert and oriented to person, place, time, and situation, CNII-XII grossly intact, no focal deficits  Psych: Normal mood and affect   Cardiac Studies  TTE 11/23/2023  1. Left ventricular ejection fraction, by estimation, is 60 to 65%. The  left ventricle has normal function. The left ventricle has no regional  wall motion abnormalities. There is mild left ventricular hypertrophy of   the septal segment. Left ventricular  diastolic parameters are indeterminate.   2. Right ventricular systolic function is normal. The right ventricular  size is normal. There is normal pulmonary artery systolic pressure.   3. The mitral valve is normal in structure. Trivial mitral valve  regurgitation. No evidence of mitral stenosis.   4. The aortic valve is tricuspid. Aortic valve regurgitation is not  visualized. No aortic stenosis is present.   5. The inferior vena cava is normal in size with greater than 50%  respiratory variability, suggesting right atrial pressure of 3 mmHg.   Patient Profile  Jeniece Hannis is a 78 y.o. female with COPD, depression, anxiety, tobacco abuse admitted on 11/21/2023 for acute encephalopathy in the setting of hyponatremia and AKI.  Course complicated by acute delirium and possible pneumonia.  Cardiology was consulted for A-fib with RVR.  Assessment & Plan   # New onset A-fib with RVR -Driven by acute illness.  Was emergently cardioverted and placed on amiodarone due to clinical instability.  Now maintaining sinus rhythm. -Plan is to continue at least 1 month of amiodarone therapy.  400 mg twice daily for 7 days followed by 200 mg for 21 days.  She will follow-up with me for further management. -LV function is normal.  TSH is normal.  No chest pain. -Continue Eliquis 5 mg twice daily.  [facility] will sign off.   Medication Recommendations: Amiodarone as above Other recommendations (labs, testing, etc): None Follow up as an outpatient: 4 to 6 weeks with me  For questions or updates, please contact [facility] Please consult [email or web address] for contact info under        Signed, Gerri Spore T. Flora Lipps, MD, [facility]  [facility]  11/26/2023 9:17 AM

## 2023-11-26 NOTE — TOC Initial Note (Signed)
 Transition of Care [facility]) - Initial/Assessment Note    Patient Details  Name: Morgan [name] MRN: [id number] Date of Birth: 31-Mar-1946  Transition of [facility]) CM/SW Contact:    Elliot Cousin, RN Phone Number: [phone] 11/26/2023, 8:35 PM  Clinical Narrative:                 TOC CM spoke to pt and son at bedside. Pt lives alone but son states he will stay with her after dc. He plans to purchase her a Life Alert system. Pt has RW and cane at home. Son will provide transportation to home. Will arrange PCP appt.  Expected Discharge Plan: Home/Self Care Barriers to Discharge: Continued Medical Work up   Patient Goals and CMS Choice Patient states their goals for this hospitalization and ongoing recovery are:: wants to get better          Expected Discharge Plan and Services   Discharge Planning Services: CM Consult   Living arrangements for the past 2 months: Single Family Home                                      Prior Living Arrangements/Services Living arrangements for the past 2 months: Single Family Home Lives with:: Self, Pets Patient language and need for interpreter reviewed:: Yes Do you feel safe going back to the place where you live?: Yes      Need for Family Participation in Patient Care: Yes (Comment) Care giver support system in place?: Yes (comment) Current home services: DME (rolling walker, cane) Criminal Activity/Legal Involvement Pertinent to Current Situation/Hospitalization: No - Comment as needed  Activities of Daily Living   ADL Screening (condition at time of admission) Independently performs ADLs?: Yes (appropriate for developmental age) Is the patient deaf or have difficulty hearing?: No Does the patient have difficulty seeing, even when wearing glasses/contacts?: No Does the patient have difficulty concentrating, remembering, or making decisions?: No  Permission Sought/Granted Permission sought to share information with :  Case Manager, Family Supports, PCP Permission granted to share information with : Yes, Verbal Permission Granted  Share Information with NAME: Sekai Nayak     Permission granted to share info w Relationship: son  Permission granted to share info w Contact Information: [phone]  Emotional Assessment Appearance:: Appears stated age Attitude/Demeanor/Rapport: Engaged Affect (typically observed): Accepting Orientation: : Oriented to Self, Oriented to Place, Oriented to  Time, Oriented to Situation   Psych Involvement: No (comment)  Admission diagnosis:  Hyponatremia [E87.1] AKI (acute kidney injury) (HCC) [N17.9] Altered mental status, unspecified altered mental status type [R41.82] Pneumonia due to infectious organism, unspecified laterality, unspecified part of lung [J18.9] Patient Active Problem List   Diagnosis Date Noted   New onset atrial fibrillation (HCC) 11/25/2023   Delirium due to multiple etiologies, acute, hyperactive 11/22/2023   Hyponatremia 11/21/2023   AKI (acute kidney injury) (HCC) 11/21/2023   Acute metabolic encephalopathy 11/21/2023   Left lower quadrant abdominal pain 11/16/2023   Trigger little finger of left hand 11/16/2023   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   Weakness of left lower extremity 06/01/2022   Thoracic compression fracture (HCC) 05/25/2022   Rash and nonspecific skin eruption 05/10/2022   Bilateral lower extremity edema 12/22/2020   [name] disturbance 09/23/2020   Preventative health care 03/30/2020   Type 2 diabetes mellitus (HCC) 03/05/2019   Tobacco abuse 08/09/2018   COPD (  chronic obstructive pulmonary disease) (HCC) 08/09/2018   Osteoporosis 08/09/2018   Medicare annual wellness visit, subsequent 10/11/2016   Constipation 06/28/2016   Kyphoscoliosis and scoliosis 12/09/2015   Back pain 10/28/2015   Atherosclerosis of aorta (HCC) 09/17/2015   Hyperlipidemia 09/08/2015   Essential hypertension  09/08/2015   Anxiety and depression 09/08/2015   GERD (gastroesophageal reflux disease) 09/08/2015   PCP:  Doreene Nest, NP Pharmacy:   [facility] [phone] - [address] [place] - [address] [place] [address] Phone: [phone] Fax: [phone]  [facility] - [place], [place] - [address] [place] [address] Phone: [phone] Fax: [phone]  [facility] [place], [place] - [address] [place] [address] [place] [address] Phone: [phone] Fax: [phone]     Social Drivers of Health (SDOH) Social History: SDOH Screenings   Food Insecurity: No Food Insecurity (11/22/2023)  Housing: Low Risk  (11/22/2023)  Transportation Needs: No Transportation Needs (11/22/2023)  Utilities: Not At Risk (11/22/2023)  Alcohol Screen: Low Risk  (03/21/2023)  Depression (PHQ2-9): High Risk (05/29/2023)  Financial Resource Strain: Low Risk  (03/21/2023)  Physical Activity: Inactive (03/21/2023)  Social Connections: Moderately Isolated (11/22/2023)  Stress: No Stress Concern Present (03/21/2023)  Tobacco Use: High Risk (11/22/2023)   SDOH Interventions: Transportation Interventions: Inpatient TOC, Intervention Not Indicated, Patient Resources (Friends/Family)   Readmission Risk Interventions    11/23/2023    3:51 PM  Readmission Risk Prevention Plan  Post Dischage Appt Complete  Medication Screening Complete  Transportation Screening Complete

## 2023-11-27 DIAGNOSIS — F05 Delirium due to known physiological condition: Secondary | ICD-10-CM | POA: Diagnosis not present

## 2023-11-27 LAB — BASIC METABOLIC PANEL
Anion gap: 9 (ref 5–15)
Anion gap: 12 (ref 5–15)
BUN: <5 mg/dL — ABNORMAL LOW (ref 8–23)
BUN: 6 mg/dL — ABNORMAL LOW (ref 8–23)
CO2: 22 mmol/L (ref 22–32)
CO2: 22 mmol/L (ref 22–32)
Calcium: 8.4 mg/dL — ABNORMAL LOW (ref 8.9–10.3)
Calcium: 8.8 mg/dL — ABNORMAL LOW (ref 8.9–10.3)
Chloride: 87 mmol/L — ABNORMAL LOW (ref 98–111)
Chloride: 86 mmol/L — ABNORMAL LOW (ref 98–111)
Creatinine, Ser: 0.66 mg/dL (ref 0.44–1.00)
Creatinine, Ser: 1.01 mg/dL — ABNORMAL HIGH (ref 0.44–1.00)
GFR, Estimated: >60 mL/min (ref >60)
GFR, Estimated: 58 mL/min — ABNORMAL LOW (ref >60)
Glucose, Bld: 105 mg/dL — ABNORMAL HIGH (ref 70–99)
Glucose, Bld: 138 mg/dL — ABNORMAL HIGH (ref 70–99)
Potassium: 3.9 mmol/L (ref 3.5–5.1)
Potassium: 3.8 mmol/L (ref 3.5–5.1)
Sodium: 118 mmol/L — CL (ref 135–145)
Sodium: 120 mmol/L — ABNORMAL LOW (ref 135–145)

## 2023-11-27 LAB — CBC
HCT: 28.1 % — ABNORMAL LOW (ref 36.0–46.0)
Hemoglobin: 10.2 g/dL — ABNORMAL LOW (ref 12.0–15.0)
MCH: 33 pg (ref 26.0–34.0)
MCHC: 36.3 g/dL — ABNORMAL HIGH (ref 30.0–36.0)
MCV: 90.9 fL (ref 80.0–100.0)
Platelets: 302 10*3/uL (ref 150–400)
RBC: 3.09 MIL/uL — ABNORMAL LOW (ref 3.87–5.11)
RDW: 12.2 % (ref 11.5–15.5)
WBC: 8.4 10*3/uL (ref 4.0–10.5)
nRBC: 0 % (ref 0.0–0.2)

## 2023-11-27 MED ORDER — SODIUM CHLORIDE 1 G PO TABS
1.0000 g | ORAL_TABLET | Freq: Once | ORAL | Status: AC
  Administered 2023-11-27: 1 g via ORAL
  Filled 2023-11-27: qty 1

## 2023-11-27 MED ORDER — LORAZEPAM 2 MG/ML IJ SOLN
0.5000 mg | INTRAMUSCULAR | Status: DC | PRN
Start: 2023-11-27 — End: 2023-12-01

## 2023-11-27 MED ORDER — SODIUM CHLORIDE 0.9 % IV BOLUS
500.0000 mL | Freq: Once | INTRAVENOUS | Status: AC
Start: 2023-11-27 — End: 2023-11-27
  Administered 2023-11-27: 500 mL via INTRAVENOUS

## 2023-11-27 NOTE — Discharge Instructions (Signed)

## 2023-11-27 NOTE — Evaluation (Signed)
 Occupational Therapy Evaluation Patient Details Name: Morgan [name] [facility] MRN: [id number] DOB: 22-Feb-1946 Today's Date: 11/27/2023   History of Present Illness   Pt is 78 year old presented to [facility] on  11/21/23 for acute encephalopathy in setting of hyponatremia and AKI. Went into afib with rvr. Pt with cardioversion. PMH - copd, htn, chronic back pain, back surgery, depression/ anxiety     Clinical Impressions PTA, pt lives alone and typically completely Independent in ADLs, IADLs and mobility without AD. Pt presents now fairly close to reported baseline, able to manage ADLs and hallway mobility without physical assistance. Pt did use RW for support and energy conservation- has the appropriate DME to use during daily routine as needed. Pt with no LOB or safety concerns; functionally appropriate for DC home w/o therapy follow up once deemed medically stable.      If plan is discharge home, recommend the following:    (PRN)     Functional Status Assessment   Patient has had a recent decline in their functional status and demonstrates the ability to make significant improvements in function in a reasonable and predictable amount of time.     Equipment Recommendations   None recommended by OT     Recommendations for Other Services         Precautions/Restrictions   Precautions Precautions: Fall Restrictions Weight Bearing Restrictions Per Provider Order: No     Mobility Bed Mobility Overal bed mobility: Modified Independent                  Transfers Overall transfer level: Modified independent Equipment used: Rolling walker (2 wheels), None Transfers: Sit to/from Stand Sit to Stand: Modified independent (Device/Increase time)                  Balance Overall balance assessment: Needs assistance Sitting-balance support: No upper extremity supported, Feet supported Sitting balance-Leahy Scale: Normal     Standing balance support: No upper  extremity supported, During functional activity Standing balance-Leahy Scale: Good                             ADL either performed or assessed with clinical judgement   ADL Overall ADL's : Needs assistance/impaired Eating/Feeding: Independent   Grooming: Standing;Modified independent   Upper Body Bathing: Set up;Sitting   Lower Body Bathing: Supervison/ safety;Sitting/lateral leans;Sit to/from stand   Upper Body Dressing : Set up;Sitting   Lower Body Dressing: Supervision/safety;Sitting/lateral leans;Sit to/from stand   Toilet Transfer: Supervision/safety;Ambulation;Regular Toilet;Rolling walker (2 wheels) Toilet Transfer Details (indicate cue type and reason): to/from bathroom using RW. able to stand easily from regular height toilet Toileting- Clothing Manipulation and Hygiene: Supervision/safety;Sitting/lateral lean;Sit to/from stand       Functional mobility during ADLs: Supervision/safety;Rolling walker (2 wheels) General ADL Comments: able to mobilize around unit with RW, no LOB. Discussed use of DME and initial modifications as needed at home. Pt also reports her son may stay with her 1-2 days     Vision Baseline Vision/History: 0 No visual deficits Ability to See in Adequate Light: 0 Adequate Patient Visual Report: No change from baseline Vision Assessment?: No apparent visual deficits     Perception         Praxis         Pertinent Vitals/Pain Pain Assessment Pain Assessment: No/denies pain     Extremity/Trunk Assessment Upper Extremity Assessment Upper Extremity Assessment: Overall WFL for tasks assessed;Right hand dominant   Lower  Extremity Assessment Lower Extremity Assessment: Defer to PT evaluation   Cervical / Trunk Assessment Cervical / Trunk Assessment: Normal   Communication Communication Communication: Impaired Factors Affecting Communication: Hearing impaired   Cognition Arousal: Alert Behavior During Therapy: WFL for  tasks assessed/performed Cognition: No apparent impairments                               Following commands: Intact       Cueing  General Comments   Cueing Techniques: Verbal cues;Gestural cues      Exercises     Shoulder Instructions      Home Living Family/patient expects to be discharged to:: Private residence Living Arrangements: Alone Available Help at Discharge: Family;Available PRN/intermittently;Friend(s);Neighbor Type of Home: House Home Access: Level entry     Home Layout: Two level;Able to live on main level with bedroom/bathroom     Bathroom Shower/Tub: Chief Strategy Officer: Standard     Home Equipment: Agricultural consultant (2 wheels);Rollator (4 wheels);Grab bars - tub/shower;Toilet riser;Shower seat          Prior Functioning/Environment Prior Level of Function : Independent/Modified Independent;Driving             Mobility Comments: Uses walker or rollator if her back is bothering her but prmiarily no assistive device ADLs Comments: Indep with ADLs, iADLs, driving and grocery shopping. Has a [place] dog, Audelia Acton    OT Problem List: Decreased activity tolerance   OT Treatment/Interventions: Self-care/ADL training;Therapeutic exercise;Energy conservation;DME and/or AE instruction;Therapeutic activities;Patient/family education      OT Goals(Current goals can be found in the care plan section)   Acute Rehab OT Goals Patient Stated Goal: go home today before the snow OT Goal Formulation: With patient Time For Goal Achievement: 12/11/23 Potential to Achieve Goals: Good   OT Frequency:  Min 1X/week    Co-evaluation              AM-PAC OT "6 Clicks" Daily Activity     Outcome Measure Help from another person eating meals?: None Help from another person taking care of personal grooming?: None Help from another person toileting, which includes using toliet, bedpan, or urinal?: A Little Help from another  person bathing (including washing, rinsing, drying)?: A Little Help from another person to put on and taking off regular upper body clothing?: A Little Help from another person to put on and taking off regular lower body clothing?: A Little 6 Click Score: 20   End of Session Equipment Utilized During Treatment: Gait belt;Rolling walker (2 wheels) Nurse Communication: Mobility status  Activity Tolerance: Patient tolerated treatment well Patient left: in bed;with call bell/phone within reach  OT Visit Diagnosis: Muscle weakness (generalized) (M62.81)                Time: 1610-9604 OT Time Calculation (min): 24 min Charges:  OT General Charges $OT Visit: 1 Visit OT Evaluation $OT Eval Low Complexity: 1 Low  Bradd Canary, OTR/L Acute Rehab Services Office: [phone]   Lorre Munroe 11/27/2023, 8:59 AM

## 2023-11-27 NOTE — Plan of Care (Signed)
  Problem: Education: Goal: Knowledge of General Education information will improve Description: Including pain rating scale, medication(s)/side effects and non-pharmacologic comfort measures Outcome: Progressing   Problem: Clinical Measurements: Goal: Will remain free from infection Outcome: Progressing Goal: Respiratory complications will improve Outcome: Progressing Goal: Cardiovascular complication will be avoided Outcome: Progressing   Problem: Coping: Goal: Level of anxiety will decrease Outcome: Progressing   Problem: Elimination: Goal: Will not experience complications related to bowel motility Outcome: Progressing

## 2023-11-27 NOTE — Progress Notes (Signed)
 Progress Note   Patient: Morgan [name] [facility] ZOX:[id number] DOB: [birth year] DOA: 11/21/2023     6 DOS: the patient was seen and examined on 11/27/2023   Brief hospital course: Morgan [name] is a 78 y.o. female with medical history significant for COPD, HTN, HLD, depression/anxiety, tobacco use who is admitted with acute encephalopathy in setting of hyponatremia and AKI.  Went into afib with RVR x 2, the second time with hypotension s/p DCCV, required Levophed.  She was transferred to ICU and started on amiodarone infusion.  Assessment and Plan:  Delirium Patient presenting with 4 days of progressive delirium Negative head CT She was clearly altered on admission and throughout the first day of hospitalization, but this appears to be resolved She was given Seroquel at bedtime x 2 doses Initial concern was that this was related to hyponatremia but her Na++ improved from 120 to 130 overnight without improvement in delirium Delirium possibly related to constipation but now appears to be more likely related to unrecognized afib with RVR Delirium precautions Held gabapentin but will restart With new-onset afib, MRI was ordered and negative for acute CVA PT/OT consulted   New onset afib Patient developed new-onset afib on 2/14 early morning She was given IV metoprolol Added Cardizem drip but she did not need it, converted spontaneously Started on heparin -> Eliquis Echo with mild septal hypertrophy but otherwise unremarkable Cardiology consulted when she had recurrent afib with hypotension on 2/14-15 Underwent DCCV and started on amiodarone drip in ICU, short period of pressors needed She is now hemodynamically stable, transitioned off amiodarone drip and to PO on 2/16 Transfer to cardiac telemetry    Urinary retention Required I/O cath overnight on 2/14 x 2 Foley placed Started tamsulosin Passed voiding trial Suggest outpatient f/u, may be able to transition off tamsulosin in the near  future   Hyponatremia Sodium 120 on admit compared to 128 on 11/16/2023 and previous 134 in August 2024 Suspected volume depletion given GI losses, +ketonuria on presentation S/p 1 L normal saline given in the ED Na++ improved but recurred, 119 yesterday and 188 today Given lack of improvement with delirium, this appears to be less likely the cause Added salt tablets on 2/17 and given a bolus on 2/18 with improvement to 120 Anticipate ongoing improvement tomorrow and she can likely dc to home then   COPD No current evidence of decompensation  Continue Trelegy Ellipta and albuterol as needed.   Hypertension Continue hydralazine Held irbesartan, Toprol XL on admission Restarted irbesartan on 2/17, as her BP has been poorly controlled   Hyperlipidemia Resume simvastatin   Depression/anxiety Resume Effexor since hyponatremia is improved   Tobacco use Patient reportedly smoking at least 1 pack/day Nicotine patch provided Her son is planning to reinforce the importance of her stopping smoking         Consultants: PCCM Cardiology   Procedures: Echocardiogram 2/14   Antibiotics: Ceftriaxone 2/12-15 Doxycycline 2/12-15  30 Day Unplanned Readmission Risk Score    Flowsheet Row ED to Hosp-Admission (Current) from 11/21/2023 in [place] 2H CARDIOVASCULAR ICU  30 Day Unplanned Readmission Risk Score (%) 16.51 Filed at 11/27/2023 0401       This score is the patient's risk of an unplanned readmission within 30 days of being discharged (0 -100%). The score is based on dignosis, age, lab data, medications, orders, and past utilization.   Low:  0-14.9   Medium: 15-21.9   High: 22-29.9   Extreme: 30 and above  Subjective: Feels great and really wants to go home.   Objective: Vitals:   11/27/23 1000 11/27/23 1100  BP: (!) 152/78   Pulse: 81   Resp: 12   Temp:  98.4 F (36.9 C)  SpO2: 94%     Intake/Output Summary (Last 24 hours) at 11/27/2023 1356 Last  data filed at 11/27/2023 1048 Gross per 24 hour  Intake 690.01 ml  Output 300 ml  Net 390.01 ml   Filed Weights   11/24/23 0409 11/25/23 0500 11/26/23 0500  Weight: 57.6 kg 62.2 kg 60.3 kg    Exam:  General:  Appears calm and comfortable and is in NAD, back to baseline mental status  Eyes:   EOMI, normal lids, iris ENT:  grossly normal hearing, lips & tongue, mmm Neck:  no LAD, masses or thyromegaly Cardiovascular:  RRR. No LE edema.  Respiratory:   CTA bilaterally with no wheezes/rales/rhonchi.  Normal respiratory effort. Abdomen:  soft, NT, ND Skin:  no rash or induration seen on limited exam Musculoskeletal:  grossly normal tone BUE/BLE, good ROM, no bony abnormality Psychiatric:  pleasant mood and affect, speech fluent and appropriate Neurologic:  CN 2-12 grossly intact, moves all extremities in coordinated fashion  Data Reviewed: I have reviewed the patient's lab results since admission.  Pertinent labs for today include:   Na++ 118 Glucose 105 Stable CBC     Family Communication: None present; I attempted to call her son without success  Disposition: Status is: Inpatient Remains inpatient appropriate because: ongoing management     Time spent: 50 minutes  Unresulted Labs (From admission, onward)     Start     Ordered   11/28/23 0500  Basic metabolic panel  Tomorrow morning,   R       Question:  Specimen collection method  Answer:  Lab=Lab collect   11/27/23 1355   11/26/23 0500  CBC  Daily,   R     Question:  Specimen collection method  Answer:  Lab=Lab collect   11/25/23 1106             Author: Jonah Blue, MD 11/27/2023 1:56 PM  For on call review [email or web address].

## 2023-11-28 DIAGNOSIS — I10 Essential (primary) hypertension: Secondary | ICD-10-CM

## 2023-11-28 DIAGNOSIS — K5900 Constipation, unspecified: Secondary | ICD-10-CM

## 2023-11-28 DIAGNOSIS — N179 Acute kidney failure, unspecified: Secondary | ICD-10-CM | POA: Diagnosis not present

## 2023-11-28 DIAGNOSIS — F05 Delirium due to known physiological condition: Secondary | ICD-10-CM | POA: Diagnosis not present

## 2023-11-28 DIAGNOSIS — E871 Hypo-osmolality and hyponatremia: Secondary | ICD-10-CM | POA: Diagnosis not present

## 2023-11-28 DIAGNOSIS — F419 Anxiety disorder, unspecified: Secondary | ICD-10-CM | POA: Diagnosis not present

## 2023-11-28 LAB — BASIC METABOLIC PANEL
Anion gap: 10 (ref 5–15)
BUN: 6 mg/dL — ABNORMAL LOW (ref 8–23)
CO2: 21 mmol/L — ABNORMAL LOW (ref 22–32)
Calcium: 8.5 mg/dL — ABNORMAL LOW (ref 8.9–10.3)
Chloride: 90 mmol/L — ABNORMAL LOW (ref 98–111)
Creatinine, Ser: 0.71 mg/dL (ref 0.44–1.00)
GFR, Estimated: >60 mL/min (ref >60)
Glucose, Bld: 107 mg/dL — ABNORMAL HIGH (ref 70–99)
Potassium: 3.8 mmol/L (ref 3.5–5.1)
Sodium: 121 mmol/L — ABNORMAL LOW (ref 135–145)

## 2023-11-28 LAB — CBC
HCT: 28.7 % — ABNORMAL LOW (ref 36.0–46.0)
Hemoglobin: 10.3 g/dL — ABNORMAL LOW (ref 12.0–15.0)
MCH: 32.8 pg (ref 26.0–34.0)
MCHC: 35.9 g/dL (ref 30.0–36.0)
MCV: 91.4 fL (ref 80.0–100.0)
Platelets: 344 10*3/uL (ref 150–400)
RBC: 3.14 MIL/uL — ABNORMAL LOW (ref 3.87–5.11)
RDW: 12.3 % (ref 11.5–15.5)
WBC: 8.4 10*3/uL (ref 4.0–10.5)
nRBC: 0 % (ref 0.0–0.2)

## 2023-11-28 LAB — SODIUM, URINE, RANDOM: Sodium, Ur: 101 mmol/L

## 2023-11-28 LAB — SODIUM
Sodium: 121 mmol/L — ABNORMAL LOW (ref 135–145)
Sodium: 120 mmol/L — ABNORMAL LOW (ref 135–145)
Sodium: 120 mmol/L — ABNORMAL LOW (ref 135–145)

## 2023-11-28 LAB — OSMOLALITY, URINE: Osmolality, Ur: 366 mosm/kg (ref 300–900)

## 2023-11-28 LAB — CREATININE, URINE, RANDOM: Creatinine, Urine: 58 mg/dL

## 2023-11-28 MED ORDER — METOPROLOL SUCCINATE ER 25 MG PO TB24
25.0000 mg | ORAL_TABLET | Freq: Every day | ORAL | Status: DC
  Administered 2023-11-28 – 2023-12-01 (×3): 25 mg via ORAL
  Filled 2023-11-28 (×3): qty 1

## 2023-11-28 MED ORDER — UREA 15 G PO PACK
15.0000 g | PACK | Freq: Two times a day (BID) | ORAL | Status: DC
  Administered 2023-11-28 (×2): 15 g via ORAL
  Filled 2023-11-28 (×3): qty 1

## 2023-11-28 NOTE — Care Management Important Message (Signed)
 Important Message  Patient Details  Name: Morgan [name] MRN: [id number] Date of Birth: 05-Aug-1946   Important Message Given:  Yes - Medicare IM     Renie Ora 11/28/2023, 12:04 PM

## 2023-11-28 NOTE — Progress Notes (Signed)
 PROGRESS NOTE    Morgan [name] [facility]  ZOX:[id number] DOB: 1946/02/19 DOA: 11/21/2023 PCP: Doreene Nest, NP   Brief Narrative:  Morgan [name] is a 78 y.o. female with medical history significant for COPD, HTN, HLD, depression/anxiety, tobacco use who is admitted with acute encephalopathy in setting of hyponatremia and AKI.  Went into afib with RVR x 2, the second time with hypotension s/p DCCV, required Levophed.  She was transferred to ICU and started on amiodarone infusion and spontaneously converted to normal sinus rhythm once she is is off of the amiodarone drip she converted back to A-fib with RVR.  Patient was now transitioned to oral amiodarone after her and transferred she converted back to normal sinus rhythm and she was subsequently transferred to Cardiac Telemetry.  Cardiology has now signed off the case however her hospitalization has been complicated by hyponatremia so Nephrology has been consulted for further evaluation and management.  Assessment and Plan:  Acute Delirium -Patient presenting with 4 days of progressive delirium -Negative head CT as it showed "No acute CT finding. Chronic small vessel ischemic changes of the white matter." -She was clearly altered on admission and throughout the first day of hospitalization, but this appears to be resolved -She was given Seroquel at bedtime x 2 doses -Initial concern was that this was related to hyponatremia but her Na+ improved and now worsened -Delirium possibly related to constipation but now appears to be more likely related to unrecognized afib with RVR -C/w Delirium precautions -Continue to Hold Gabapentin  -With new-onset afib, MRI was ordered and negative for acute CVA -PT/OT consulted and recommending Home Health PT/OT   New onset Atrial Fibrillation with RVR s/p Conversion to NSR -Patient developed new-onset afib on 2/14 early morning She was given IV metoprolol -Added Cardizem drip but she did not need it,  converted spontaneously Started on heparin -> Eliquis -Echo with mild septal hypertrophy but otherwise unremarkable Cardiology consulted when she had recurrent afib with hypotension on 2/14-15 -Underwent DCCV and started on amiodarone drip in ICU, short period of pressors needed -She is now hemodynamically stable, transitioned off amiodarone drip and to PO on 2/16 -Transferred to cardiac telemetry  -Continue p.o. amiodarone 400 g p.o. twice daily for 7 days and then 200 mg p.o. daily for 21 days -Restarted her home Metoprolol Succinate 25 mg p.o. 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 -Sodium 120 on admit compared to 128 on 11/16/2023 and previous 134 in August 2024 -Initially Suspected volume depletion given GI losses, +ketonuria on presentation -S/p 1 L normal saline given in the ED -Na+ improved but now fluctuating  -Na+ Trend: Recent Labs  Lab 11/24/23 0416 11/25/23 0822 11/26/23 0748 11/27/23 0229 11/27/23 1144 11/28/23 0229 11/28/23 1241  NA 132* 127* 119* 118* 120* 121* 121*  -Given lack of improvement with delirium, this appears to be less likely the cause -Added salt tablets on 2/17 and given a bolus on 2/18 with improvement to 120 yesterday -Given her lack of improvement we will consulted nephrology for further evaluation recommendations and repeating a urine sodium and urine osmolality and urine creatinine; Urine Na+ was 101, Urine Cr was 58, and Urine Osm was 366 -Nephrology recommends starting Urea at 15 g po BID and monitoring sodium levels every  4 hours today and continue sodium tablets 1 g 3 times daily for now and monitoring her neurological status closely  COPD -No current evidence of decompensation  -Continue Trelegy Ellipta substitution  with Breo Ellipta plus Incruse Ellipta 1 puff IH daily and continue with Xopenex 0.63 mg neb every 6 as needed for wheezing or shortness of breath   Hypertension -Continue Hydralazine 25 mg po q12h -Initially Held irbesartan, Toprol XL on admission -Restarted irbesartan on 2/17 and is now being continued on 300 mg po Daily -Resume Home Metoprolol Succinate 25 mg po daily -Her BP has been poorly controlled continue monitor blood pressures per protocol -Last blood pressure reading was 156/73   Hyperlipidemia -C/w Simvastatin 40 mg po Daily    Depression/Anxiety -Hold Home Venlafaxine XR since hyponatremia persists -Will need outpatient further evaluation for medication adjustments -Continue with IV Lorazepam 0.5 to 1 mg every 4 as needed for anxiety   Tobacco Use -Patient reportedly smoking at least 1 pack/day -Continue nicotine patch 7 mg transdermally q. 24h as well as 2 mg of nicotine polacrilex as needed for smoking cessation -Cessation counseling given and will need to continue reinforcement  GERD/GI Prophylaxis -Continue PPI with Pantoprazole 40 mg p.o. daily   Normocytic Anemia  -Hgb/Hct Trend: Recent Labs  Lab 11/21/23 1227 11/22/23 0514 11/23/23 0734 11/25/23 0822 11/26/23 0500 11/27/23 0229 11/28/23 0229  HGB 11.9* 11.6* 12.1 10.3* 10.9* 10.2* 10.3*  HCT 35.0* 33.4* 34.7* 29.0* 31.0* 28.1* 28.7*  MCV  --  94.9 93.0 94.2 94.2 90.9 91.4  -Check Anemia Panel in the AM -Continue to Monitor for S/Sx of Bleeding; No overt bleeding ntoed -Repeat CBC in the AM  Hypoalbuminemia -Patient's Albumin Trend: Recent Labs  Lab 11/16/23 1152 11/21/23 1142 11/22/23 0514 11/22/23 1618 11/24/23 0416  ALBUMIN 4.3 3.9 3.4* 3.3* 2.2*  -Continue to Monitor and Trend and repeat CMP in the AM   DVT prophylaxis:  apixaban (ELIQUIS) tablet 5 mg    Code Status: Full Code Family Communication: No family present at bedside  Disposition Plan:  Level of care: Telemetry  Cardiac Status is: Inpatient Remains inpatient appropriate because: Needs further clinical improvement and clearance by the nephrology team   Consultants:  Cardiology PCCM transfer Nephrology  Procedures:  ECHOCARDIOGRAM IMPRESSIONS     1. Left ventricular ejection fraction, by estimation, is 60 to 65%. The  left ventricle has normal function. The left ventricle has no regional  wall motion abnormalities. There is mild left ventricular hypertrophy of  the septal segment. Left ventricular  diastolic parameters are indeterminate.   2. Right ventricular systolic function is normal. The right ventricular  size is normal. There is normal pulmonary artery systolic pressure.   3. The mitral valve is normal in structure. Trivial mitral valve  regurgitation. No evidence of mitral stenosis.   4. The aortic valve is tricuspid. Aortic valve regurgitation is not  visualized. No aortic stenosis is present.   5. The inferior vena cava is normal in size with greater than 50%  respiratory variability, suggesting right atrial pressure of 3 mmHg.   FINDINGS   Left Ventricle: Left ventricular ejection fraction, by estimation, is 60  to 65%. The left ventricle has normal function. The left ventricle has no  regional wall motion abnormalities. Strain imaging was not performed. The  left ventricular internal cavity   size was normal in size. There is mild left ventricular hypertrophy of  the septal segment. Left ventricular diastolic parameters are  indeterminate. Indeterminate filling pressures.  Right Ventricle: The right ventricular size is normal. No increase in  right ventricular wall thickness. Right ventricular systolic function is  normal. There is normal pulmonary artery systolic pressure. The tricuspid  regurgitant velocity is 2.66 m/s, and   with an assumed right atrial pressure of 3 mmHg, the estimated right  ventricular systolic pressure is 31.3 mmHg.   Left Atrium: Left atrial  size was normal in size.   Right Atrium: Right atrial size was normal in size.   Pericardium: There is no evidence of pericardial effusion.   Mitral Valve: The mitral valve is normal in structure. Trivial mitral  valve regurgitation. No evidence of mitral valve stenosis.   Tricuspid Valve: The tricuspid valve is normal in structure. Tricuspid  valve regurgitation is trivial. No evidence of tricuspid stenosis.   Aortic Valve: The aortic valve is tricuspid. Aortic valve regurgitation is  not visualized. No aortic stenosis is present.   Pulmonic Valve: The pulmonic valve was normal in structure. Pulmonic valve  regurgitation is not visualized. No evidence of pulmonic stenosis.   Aorta: The aortic root is normal in size and structure.   Venous: The inferior vena cava is normal in size with greater than 50%  respiratory variability, suggesting right atrial pressure of 3 mmHg.   IAS/Shunts: No atrial level shunt detected by color flow Doppler.   Additional Comments: 3D imaging was not performed.     LEFT VENTRICLE  PLAX 2D  LVIDd:         3.80 cm   Diastology  LVIDs:         2.00 cm   LV e' medial:    7.94 cm/s  LV PW:         1.00 cm   LV E/e' medial:  9.5  LV IVS:        1.20 cm   LV e' lateral:   9.57 cm/s  LVOT diam:     2.00 cm   LV E/e' lateral: 7.9  LV SV:         66  LV SV Index:   42  LVOT Area:     3.14 cm     RIGHT VENTRICLE             IVC  RV Basal diam:  3.50 cm     IVC diam: 1.80 cm  RV S prime:     15.90 cm/s  TAPSE (M-mode): 2.7 cm   LEFT ATRIUM             Index        RIGHT ATRIUM           Index  LA diam:        3.10 cm 2.00 cm/m   RA Area:     12.00 cm  LA Vol (A2C):   25.8 ml 16.68 ml/m  RA Volume:   27.20 ml  17.59 ml/m  LA Vol (A4C):   35.9 ml 23.21 ml/m  LA Biplane Vol: 30.8 ml 19.91 ml/m   AORTIC VALVE  LVOT Vmax:   104.00 cm/s  LVOT Vmean:  76.500 cm/s  LVOT VTI:    0.209 m    AORTA  Ao Root diam: 2.80 cm  Ao Asc diam:  3.60 cm    MITRAL VALVE               TRICUSPID VALVE  MV Area (PHT): 4.06 cm    TR Peak grad:   28.3 mmHg  MV Decel Time:  187 msec    TR Vmax:        266.00 cm/s  MV E velocity: 75.60 cm/s  MV A velocity: 74.50 cm/s  SHUNTS  MV E/A ratio:  1.01        Systemic VTI:  0.21 m                             Systemic Diam: 2.00 cm   Antimicrobials:  Anti-infectives (From admission, onward)    Start     Dose/Rate Route Frequency Ordered Stop   11/25/23 0500  vancomycin (VANCOREADY) IVPB 750 mg/150 mL  Status:  Discontinued        750 mg 150 mL/hr over 60 Minutes Intravenous Every 24 hours 11/24/23 0207 11/24/23 0318   11/24/23 0300  piperacillin-tazobactam (ZOSYN) IVPB 3.375 g  Status:  Discontinued        3.375 g 12.5 mL/hr over 240 Minutes Intravenous Every 8 hours 11/24/23 0205 11/24/23 0318   11/24/23 0300  vancomycin (VANCOREADY) IVPB 1250 mg/250 mL  Status:  Discontinued        1,250 mg 166.7 mL/hr over 90 Minutes Intravenous  Once 11/24/23 0206 11/24/23 0450   11/22/23 1500  cefTRIAXone (ROCEPHIN) 2 g in sodium chloride 0.9 % 100 mL IVPB  Status:  Discontinued        2 g 200 mL/hr over 30 Minutes Intravenous Every 24 hours 11/21/23 2008 11/24/23 0152   11/22/23 1500  doxycycline (VIBRAMYCIN) 100 mg in sodium chloride 0.9 % 250 mL IVPB  Status:  Discontinued        100 mg 125 mL/hr over 120 Minutes Intravenous Every 24 hours 11/21/23 2008 11/24/23 0318   11/21/23 1515  cefTRIAXone (ROCEPHIN) 2 g in sodium chloride 0.9 % 100 mL IVPB        2 g 200 mL/hr over 30 Minutes Intravenous  Once 11/21/23 1507 11/21/23 1620   11/21/23 1515  doxycycline (VIBRAMYCIN) 100 mg in sodium chloride 0.9 % 250 mL IVPB        100 mg 125 mL/hr over 120 Minutes Intravenous  Once 11/21/23 1507 11/21/23 1928       Subjective: Seen and examined at bedside and states that she is feeling fine.  No nausea or vomiting.  Wanting to go home.  No chest pain or shortness breath.  Feels okay.  No other concerns or  complaints at this time.  Objective: Vitals:   11/28/23 0414 11/28/23 0815 11/28/23 0912 11/28/23 1150  BP:  124/66  (!) 156/73  Pulse: 88 97  95  Resp:  18  18  Temp:  97.8 F (36.6 C)  97.8 F (36.6 C)  TempSrc:    Oral  SpO2: 94% 94% 94% 94%  Weight:      Height:        Intake/Output Summary (Last 24 hours) at 11/28/2023 1543 Last data filed at 11/28/2023 1300 Gross per 24 hour  Intake 480 ml  Output --  Net 480 ml   Filed Weights   11/25/23 0500 11/26/23 0500 11/28/23 0412  Weight: 62.2 kg 60.3 kg 59 kg   Examination: Physical Exam:  Constitutional: WN/WD elderly Caucasian female no acute distress Respiratory: Diminished to auscultation bilaterally, no wheezing, rales, rhonchi or crackles. Normal respiratory effort and patient is not tachypenic. No accessory muscle use.  Unlabored breathing Cardiovascular: RRR, no murmurs / rubs / gallops. S1 and S2 auscultated. No extremity edema. Abdomen: Soft, non-tender, non-distended.  Bowel sounds positive.  GU: Deferred. Musculoskeletal: No clubbing / cyanosis of digits/nails. No joint deformity upper and lower extremities. Skin: No rashes, lesions, ulcers on limited skin evaluation. No induration; Warm and dry.  Neurologic: CN 2-12 grossly intact with no focal deficits. Romberg sign and cerebellar reflexes not assessed.  Psychiatric: Normal judgment and insight. Alert and oriented x 3. Normal mood and appropriate affect.   Data Reviewed: I have personally reviewed following labs and imaging studies  CBC: Recent Labs  Lab 11/23/23 0734 11/25/23 0822 11/26/23 0500 11/27/23 0229 11/28/23 0229  WBC 12.2* 10.1 9.1 8.4 8.4  NEUTROABS 10.1* 8.1*  --   --   --   HGB 12.1 10.3* 10.9* 10.2* 10.3*  HCT 34.7* 29.0* 31.0* 28.1* 28.7*  MCV 93.0 94.2 94.2 90.9 91.4  PLT 283 281 258 302 344   Basic Metabolic Panel: Recent Labs  Lab 11/25/23 0822 11/26/23 0748 11/27/23 0229 11/27/23 1144 11/28/23 0229 11/28/23 1241  NA 127*  119* 118* 120* 121* 121*  K 4.4 4.4 3.9 3.8 3.8  --   CL 97* 88* 87* 86* 90*  --   CO2 22 21* 22 22 21*  --   GLUCOSE 171* 133* 105* 138* 107*  --   BUN 6* <5* <5* 6* 6*  --   CREATININE 0.93 0.78 0.66 1.01* 0.71  --   CALCIUM 8.7* 8.5* 8.4* 8.8* 8.5*  --    GFR: Estimated Creatinine Clearance: 49.4 mL/min (by C-G formula based on SCr of 0.71 mg/dL). Liver Function Tests: Recent Labs  Lab 11/22/23 0514 11/22/23 1618 11/24/23 0416  AST 60* 51* 29  ALT 42 39 25  ALKPHOS 66 63 46  BILITOT 0.9 0.6 0.5  PROT 6.2* 6.0* 4.6*  ALBUMIN 3.4* 3.3* 2.2*   No results for input(s): "LIPASE", "AMYLASE" in the last 168 hours. No results for input(s): "AMMONIA" in the last 168 hours. Coagulation Profile: No results for input(s): "INR", "PROTIME" in the last 168 hours. Cardiac Enzymes: No results for input(s): "CKTOTAL", "CKMB", "CKMBINDEX", "TROPONINI" in the last 168 hours. BNP (last 3 results) No results for input(s): "PROBNP" in the last 8760 hours. HbA1C: No results for input(s): "HGBA1C" in the last 72 hours. CBG: Recent Labs  Lab 11/22/23 1522  GLUCAP 168*   Lipid Profile: No results for input(s): "CHOL", "HDL", "LDLCALC", "TRIG", "CHOLHDL", "LDLDIRECT" in the last 72 hours. Thyroid Function Tests: No results for input(s): "TSH", "T4TOTAL", "FREET4", "T3FREE", "THYROIDAB" in the last 72 hours. Anemia Panel: No results for input(s): "VITAMINB12", "FOLATE", "FERRITIN", "TIBC", "IRON", "RETICCTPCT" in the last 72 hours. Sepsis Labs: Recent Labs  Lab 11/23/23 0734 11/24/23 0416 11/24/23 0822  PROCALCITON 0.15  --   --   LATICACIDVEN  --  1.0 0.8   Recent Results (from the past 240 hours)  Resp panel by RT-PCR (RSV, Flu A&B, Covid) Anterior Nasal Swab     Status: None   Collection Time: 11/21/23 11:42 AM   Specimen: Anterior Nasal Swab  Result Value Ref Range Status   SARS Coronavirus 2 by RT PCR NEGATIVE NEGATIVE Final   Influenza A by PCR NEGATIVE NEGATIVE Final    Influenza B by PCR NEGATIVE NEGATIVE Final    Comment: (NOTE) The Xpert Xpress SARS-CoV-2/FLU/RSV plus assay is intended as an aid in the diagnosis of influenza from Nasopharyngeal swab specimens and should not be used as a sole basis for treatment. Nasal washings and aspirates are unacceptable for Xpert Xpress SARS-CoV-2/FLU/RSV testing.  Fact Sheet for Patients: BloggerCourse.com  Fact Sheet for Healthcare Providers: SeriousBroker.it  This test is not yet approved or cleared by the [place] FDA and has been authorized for detection and/or diagnosis of SARS-CoV-2 by FDA under an Emergency Use Authorization (EUA). This EUA will remain in effect (meaning this test can be used) for the duration of the COVID-19 declaration under Section 564(b)(1) of the Act, 21 U.S.C. section 360bbb-3(b)(1), unless the authorization is terminated or revoked.     Resp Syncytial Virus by PCR NEGATIVE NEGATIVE Final    Comment: (NOTE) Fact Sheet for Patients: BloggerCourse.com  Fact Sheet for Healthcare Providers: SeriousBroker.it  This test is not yet approved or cleared by the [place] FDA and has been authorized for detection and/or diagnosis of SARS-CoV-2 by FDA under an Emergency Use Authorization (EUA). This EUA will remain in effect (meaning this test can be used) for the duration of the COVID-19 declaration under Section 564(b)(1) of the Act, 21 U.S.C. section 360bbb-3(b)(1), unless the authorization is terminated or revoked.  Performed at [facility], [address], [place], [place] [address]   MRSA Next Gen by PCR, Nasal     Status: None   Collection Time: 11/24/23  2:51 AM   Specimen: Nasal Mucosa; Nasal Swab  Result Value Ref Range Status   MRSA by PCR Next Gen NOT DETECTED NOT DETECTED Final    Comment: (NOTE) The GeneXpert MRSA Assay (FDA approved for NASAL  specimens only), is one component of a comprehensive MRSA colonization surveillance program. It is not intended to diagnose MRSA infection nor to guide or monitor treatment for MRSA infections. Test performance is not FDA approved in patients less than 18 years old. Performed at [facility], [address], [place], [place] [address]     Radiology Studies: No results found.  Scheduled Meds:  amiodarone  400 mg Oral BID   Followed by   Melene Muller ON 12/02/2023] amiodarone  200 mg Oral Daily   apixaban  5 mg Oral BID   atorvastatin  40 mg Oral Daily   Chlorhexidine Gluconate Cloth  6 each Topical Daily   docusate sodium  100 mg Oral BID   fluticasone furoate-vilanterol  1 puff Inhalation Daily   And   umeclidinium bromide  1 puff Inhalation Daily   hydrALAZINE  25 mg Oral Q12H   irbesartan  300 mg Oral Daily   metoprolol succinate  25 mg Oral Daily   nicotine  7 mg Transdermal Daily   pantoprazole  40 mg Oral Daily   polyethylene glycol  17 g Oral Daily   sodium chloride  1 g Oral TID WC   urea  15 g Oral BID   Continuous Infusions:   LOS: 7 days   Marguerita Merles, DO [facility] Available via Epic secure chat 7am-7pm After these hours, please refer to coverage provider listed on [email or web address] 11/28/2023, 3:43 PM
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
## 2023-11-28 NOTE — Plan of Care (Signed)
  Problem: Activity: Goal: Risk for activity intolerance will decrease Outcome: Progressing   Problem: Pain Managment: Goal: General experience of comfort will improve and/or be controlled Outcome: Progressing   Problem: Safety: Goal: Ability to remain free from injury will improve Outcome: Progressing   Problem: Skin Integrity: Goal: Risk for impaired skin integrity will decrease Outcome: Progressing

## 2023-11-28 NOTE — Progress Notes (Signed)
 Physical Therapy Treatment Patient Details Name: Morgan [name] MRN: [id number] DOB: 04-May-1946 Today's Date: 11/28/2023   History of Present Illness Pt is 78 year old presented to [facility] on  11/21/23 for acute encephalopathy in setting of hyponatremia and AKI. Went into afib with rvr. Pt with cardioversion. PMH - copd, htn, chronic back pain, back surgery, depression/ anxiety    PT Comments  Session focused on gait training to promote ambulation without an AD and theract to promote functional mobility around her home and community. Pt showed improvement in her gait by performing more challenging interventions with CGA and no LOB. Pt required additional time and cueing for posture and positioning when picking boxes up from the floor. Pt had some difficulty with braiding and walking horizontally when attempting to stretch outside her BOS and with static narrow BOS as seen with increased postural sway and reaching strategy during semi-tandem stance. Pt continues to have difficulty with dynamic and higher level balance activities and has requested follow up HHPT to address these impairments. Pt would benefit from HHPT to address these impairments. Will continue to follow acutely.   If plan is discharge home, recommend the following: Assist for transportation   Can travel by private vehicle        Equipment Recommendations  None recommended by PT    Recommendations for Other Services       Precautions / Restrictions Precautions Precautions: Fall Recall of Precautions/Restrictions: Intact Restrictions Weight Bearing Restrictions Per Provider Order: No     Mobility  Bed Mobility   Bed Mobility: Supine to Sit     Supine to sit: Modified independent (Device/Increase time), HOB elevated     General bed mobility comments: Pt requires slightly increased time and HOB elevated.    Transfers Overall transfer level: Modified independent Equipment used: None Transfers: Sit to/from  Stand Sit to Stand: Modified independent (Device/Increase time)           General transfer comment: Increased fwd trunk flexion and wide BOS. Slightly increased time    Ambulation/Gait Ambulation/Gait assistance: Supervision, Contact guard assist   Assistive device: Rolling walker (2 wheels), None Gait Pattern/deviations: Step-through pattern, Decreased stride length, Drifts right/left, Wide base of support Gait velocity: decr Gait velocity interpretation: 1.31 - 2.62 ft/sec, indicative of limited community ambulator   General Gait Details: Supervision without gait challenges; CGA with gait challenges. Without AD, pt will use objects in her environment to help maintain balance but is able to stop when cued.   Stairs             Wheelchair Mobility     Tilt Bed    Modified Rankin (Stroke Patients Only)       Balance Overall balance assessment: Needs assistance Sitting-balance support: No upper extremity supported, Feet supported Sitting balance-Leahy Scale: Normal Sitting balance - Comments: Pt sits EOB, in chair, and recliner without LOB or directional lean   Standing balance support: No upper extremity supported, During functional activity Standing balance-Leahy Scale: Good Standing balance comment: Pt stands with wide BOS & increased out-toeing of R LE. No LOB             High level balance activites: Side stepping, Braiding High Level Balance Comments: Pt had difficulty with braiding b/c exercise caused L hip pain and difficulty going outside her BOS.            Communication Communication Communication: Impaired Factors Affecting Communication: Hearing impaired  Cognition Arousal: Alert Behavior During Therapy: [facility] for tasks  assessed/performed   PT - Cognitive impairments: No apparent impairments                         Following commands: Intact      Cueing Cueing Techniques: Verbal cues, Visual cues, Gestural cues  Exercises  Other Exercises Other Exercises: Static semi-tandem stance, Bil 2x25" (At room's sink with PRN UE support) Other Exercises: Dynamic reaching obstacle course, x4 boxes and drawing her name (Pt had to bend and pick 4 glove boxes off the floor and hand them to therapist. Also had to write her name as high as she could reach on room's whiteboard, 1 straight up, 1 across her body)    General Comments General comments (skin integrity, edema, etc.): VSS throughout session      Pertinent Vitals/Pain Pain Assessment Pain Assessment: No/denies pain    Home Living                          Prior Function            PT Goals (current goals can now be found in the care plan section) Acute Rehab PT Goals Patient Stated Goal: go home PT Goal Formulation: With patient Time For Goal Achievement: 12/03/23 Potential to Achieve Goals: Good Progress towards PT goals: Progressing toward goals    Frequency    Min 1X/week      PT Plan      Co-evaluation              AM-PAC PT "6 Clicks" Mobility   Outcome Measure  Help needed turning from your back to your side while in a flat bed without using bedrails?: None Help needed moving from lying on your back to sitting on the side of a flat bed without using bedrails?: None Help needed moving to and from a bed to a chair (including a wheelchair)?: None Help needed standing up from a chair using your arms (e.g., wheelchair or bedside chair)?: None Help needed to walk in hospital room?: A Little Help needed climbing 3-5 steps with a railing? : A Little 6 Click Score: 22    End of Session Equipment Utilized During Treatment: Gait belt Activity Tolerance: Patient tolerated treatment well Patient left: in chair;with call bell/phone within reach;with chair alarm set   PT Visit Diagnosis: Unsteadiness on feet (R26.81);Other abnormalities of gait and mobility (R26.89);Muscle weakness (generalized) (M62.81)     Time: 1610-9604 PT  Time Calculation (min) (ACUTE ONLY): 36 min  Charges:    $Gait Training: 8-22 mins $Therapeutic Activity: 8-22 mins PT General Charges $$ ACUTE PT VISIT: 1 Visit                    [address], SPT    [place] 11/28/2023, 3:06 PM

## 2023-11-29 DIAGNOSIS — N179 Acute kidney failure, unspecified: Secondary | ICD-10-CM | POA: Diagnosis not present

## 2023-11-29 DIAGNOSIS — E871 Hypo-osmolality and hyponatremia: Secondary | ICD-10-CM | POA: Diagnosis not present

## 2023-11-29 DIAGNOSIS — F419 Anxiety disorder, unspecified: Secondary | ICD-10-CM | POA: Diagnosis not present

## 2023-11-29 DIAGNOSIS — F05 Delirium due to known physiological condition: Secondary | ICD-10-CM | POA: Diagnosis not present

## 2023-11-29 LAB — COMPREHENSIVE METABOLIC PANEL
ALT: 27 U/L (ref 0–44)
AST: 26 U/L (ref 15–41)
Albumin: 3.1 g/dL — ABNORMAL LOW (ref 3.5–5.0)
Alkaline Phosphatase: 61 U/L (ref 38–126)
Anion gap: 12 (ref 5–15)
BUN: 21 mg/dL (ref 8–23)
CO2: 21 mmol/L — ABNORMAL LOW (ref 22–32)
Calcium: 9 mg/dL (ref 8.9–10.3)
Chloride: 89 mmol/L — ABNORMAL LOW (ref 98–111)
Creatinine, Ser: 0.84 mg/dL (ref 0.44–1.00)
GFR, Estimated: >60 mL/min (ref >60)
Glucose, Bld: 205 mg/dL — ABNORMAL HIGH (ref 70–99)
Potassium: 3.6 mmol/L (ref 3.5–5.1)
Sodium: 122 mmol/L — ABNORMAL LOW (ref 135–145)
Total Bilirubin: 0.6 mg/dL (ref 0.0–1.2)
Total Protein: 5.6 g/dL — ABNORMAL LOW (ref 6.5–8.1)

## 2023-11-29 LAB — CBC WITH DIFFERENTIAL/PLATELET
Abs Immature Granulocytes: 0.11 10*3/uL — ABNORMAL HIGH (ref 0.00–0.07)
Basophils Absolute: 0 10*3/uL (ref 0.0–0.1)
Basophils Relative: 0 %
Eosinophils Absolute: 0.2 10*3/uL (ref 0.0–0.5)
Eosinophils Relative: 2 %
HCT: 28.1 % — ABNORMAL LOW (ref 36.0–46.0)
Hemoglobin: 10.1 g/dL — ABNORMAL LOW (ref 12.0–15.0)
Immature Granulocytes: 1 %
Lymphocytes Relative: 15 %
Lymphs Abs: 1.4 10*3/uL (ref 0.7–4.0)
MCH: 32.7 pg (ref 26.0–34.0)
MCHC: 35.9 g/dL (ref 30.0–36.0)
MCV: 90.9 fL (ref 80.0–100.0)
Monocytes Absolute: 0.7 10*3/uL (ref 0.1–1.0)
Monocytes Relative: 7 %
Neutro Abs: 7.1 10*3/uL (ref 1.7–7.7)
Neutrophils Relative %: 75 %
Platelets: 419 10*3/uL — ABNORMAL HIGH (ref 150–400)
RBC: 3.09 MIL/uL — ABNORMAL LOW (ref 3.87–5.11)
RDW: 12.3 % (ref 11.5–15.5)
WBC: 9.5 10*3/uL (ref 4.0–10.5)
nRBC: 0 % (ref 0.0–0.2)

## 2023-11-29 LAB — FOLATE: Folate: 22.6 ng/mL (ref >5.9)

## 2023-11-29 LAB — SODIUM
Sodium: 122 mmol/L — ABNORMAL LOW (ref 135–145)
Sodium: 125 mmol/L — ABNORMAL LOW (ref 135–145)
Sodium: 125 mmol/L — ABNORMAL LOW (ref 135–145)
Sodium: 124 mmol/L — ABNORMAL LOW (ref 135–145)

## 2023-11-29 LAB — IRON AND TIBC
Iron: 60 ug/dL (ref 28–170)
Saturation Ratios: 23 % (ref 10.4–31.8)
TIBC: 259 ug/dL (ref 250–450)
UIBC: 199 ug/dL

## 2023-11-29 LAB — MAGNESIUM: Magnesium: 1.5 mg/dL — ABNORMAL LOW (ref 1.7–2.4)

## 2023-11-29 LAB — FERRITIN: Ferritin: 276 ng/mL (ref 11–307)

## 2023-11-29 LAB — PHOSPHORUS: Phosphorus: 3.1 mg/dL (ref 2.5–4.6)

## 2023-11-29 LAB — VITAMIN B12: Vitamin B-12: 1399 pg/mL — ABNORMAL HIGH (ref 180–914)

## 2023-11-29 LAB — RETICULOCYTES
Immature Retic Fract: 14.5 % (ref 2.3–15.9)
RBC.: 3.04 MIL/uL — ABNORMAL LOW (ref 3.87–5.11)
Retic Count, Absolute: 62.3 10*3/uL (ref 19.0–186.0)
Retic Ct Pct: 2.1 % (ref 0.4–3.1)

## 2023-11-29 MED ORDER — UREA 15 G PO PACK
30.0000 g | PACK | Freq: Two times a day (BID) | ORAL | Status: DC
  Administered 2023-11-29 – 2023-11-30 (×3): 30 g via ORAL
  Filled 2023-11-29 (×4): qty 2

## 2023-11-29 MED ORDER — MAGNESIUM SULFATE 4 GM/100ML IV SOLN
4.0000 g | Freq: Once | INTRAVENOUS | Status: AC
  Administered 2023-11-29: 4 g via INTRAVENOUS
  Filled 2023-11-29: qty 100

## 2023-11-29 NOTE — Progress Notes (Signed)
 Nephrology Follow-Up Consult note   Assessment/Recommendations: Morgan [name] [facility] is a/an 78 y.o. female with a past medical history significant for COPD, HTN, HLD, tobacco use disorder who presents with altered mental status c/b hyponatremia and AKI   Symptomatic Hyponatremia: more significant symptoms on admission and overall now improved.  Minimal to no symptoms at this point.  Initially treated as dehydration.  Urine studies on 2/12 were somewhat unequivocal with urine sodium of 25 and urine osmolality of 233 so treatment for dehydration seemed reasonable.  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  Possibly related to Effexor which has now been stopped. -Continue sodium tablet 1 g 3 times daily -Increase urea to 30 g twice daily -Monitor sodium every 6 hours for today -Goal sodium 128-135 by tomorrow morning -Consider tolvaptan and if she fails to improve by this afternoon -Monitor neurological status closely -Stopped Effexor   AKI: likely was 2/2 dehydration now resolved.   Depression: holding effexor. Consider alternate treatment outpatient   Anemia: Management per primary team   Altered mental status: Likely multifactorial with sodium contributing.  Now improved   Hypertension: Continue hydralazine 25 mg twice daily, irbesartan 300 mg daily.  Consider stopping sodium tablets when able   A-fib with RVR: On amiodarone.  Cardiology following   Recommendations conveyed to primary service.    Darnell Level [facility] 11/29/2023 10:42 AM  ___________________________________________________________  CC: Altered mental status  Interval History/Subjective: Patient feels well today with no complaints.  Good appetite.  No nausea, vomiting, confusion   Medications:  Current Facility-Administered Medications  Medication Dose Route Frequency Provider Last Rate Last  Admin   acetaminophen (TYLENOL) tablet 650 mg  650 mg Oral Q6H PRN Charlsie Quest, MD   650 mg at 11/23/23 2226   Or   acetaminophen (TYLENOL) suppository 650 mg  650 mg Rectal Q6H PRN Charlsie Quest, MD       amiodarone (PACERONE) tablet 400 mg  400 mg Oral BID Sande Rives, MD   400 mg at 11/29/23 0820   Followed by   Melene Muller ON 12/02/2023] amiodarone (PACERONE) tablet 200 mg  200 mg Oral Daily O'Neal, Ronnald Ramp, MD       apixaban Everlene Balls) tablet 5 mg  5 mg Oral BID Mosetta Anis, RPH   5 mg at 11/29/23 0820   atorvastatin (LIPITOR) tablet 40 mg  40 mg Oral Daily Jonah Blue, MD   40 mg at 11/29/23 0820   bisacodyl (DULCOLAX) EC tablet 5 mg  5 mg Oral Daily PRN Jonah Blue, MD       Chlorhexidine Gluconate Cloth 2 % PADS 6 each  6 each Topical Daily Lynnell Catalan, MD   6 each at 11/28/23 0935   docusate sodium (COLACE) capsule 100 mg  100 mg Oral BID Jonah Blue, MD   100 mg at 11/29/23 0820   fluticasone furoate-vilanterol (BREO ELLIPTA) 100-25 MCG/ACT 1 puff  1 puff Inhalation Daily Charlsie Quest, MD   1 puff at 11/29/23 0818   And   umeclidinium bromide (INCRUSE ELLIPTA) 62.5 MCG/ACT 1 puff  1 puff Inhalation Daily Charlsie Quest, MD   1 puff at 11/29/23 0818   hydrALAZINE (APRESOLINE) tablet 25 mg  25 mg Oral Q12H Henry Russel, MD   25 mg at 11/29/23 0820   irbesartan (AVAPRO) tablet 300 mg  300 mg Oral Daily Jonah Blue,  MD   300 mg at 11/29/23 0820   levalbuterol (XOPENEX) nebulizer solution 0.63 mg  0.63 mg Nebulization Q6H PRN Patrici Ranks, MD       LORazepam (ATIVAN) injection 0.5-1 mg  0.5-1 mg Intravenous Q4H PRN Jonah Blue, MD       metoprolol succinate (TOPROL-XL) 24 hr tablet 25 mg  25 mg Oral Daily Marguerita Merles [place], DO   25 mg at 11/29/23 9147   nicotine (NICODERM CQ - dosed in mg/24 hr) patch 7 mg  7 mg Transdermal Daily Jonah Blue, MD   7 mg at 11/29/23 8295   nicotine polacrilex (NICORETTE) gum 2 mg  2 mg Oral PRN  Jonah Blue, MD       ondansetron [facility]) tablet 4 mg  4 mg Oral Q6H PRN Charlsie Quest, MD       Or   ondansetron (ZOFRAN) injection 4 mg  4 mg Intravenous Q6H PRN Charlsie Quest, MD       Oral care mouth rinse  15 mL Mouth Rinse PRN Cheri Fowler, MD       pantoprazole (PROTONIX) EC tablet 40 mg  40 mg Oral Daily Jonah Blue, MD   40 mg at 11/29/23 0820   polyethylene glycol (MIRALAX / GLYCOLAX) packet 17 g  17 g Oral Daily Jonah Blue, MD   17 g at 11/25/23 0900   urea (URE-NA) oral packet 30 g  30 g Oral BID Darnell Level, MD   30 g at 11/29/23 6213      Review of Systems: 10 systems reviewed and negative except per interval history/subjective  Physical Exam: Vitals:   11/29/23 0000 11/29/23 0455  BP: 109/80 136/81  Pulse: 85 84  Resp: 18 20  Temp: 98.8 F (37.1 C) 98.2 F (36.8 C)  SpO2: 94% 94%   Total I/O In: -  Out: 400 [Urine:400]  Intake/Output Summary (Last 24 hours) at 11/29/2023 1042 Last data filed at 11/29/2023 1034 Gross per 24 hour  Intake 240 ml  Output 1300 ml  Net -1060 ml   Constitutional: well-appearing, no acute distress ENMT: ears and nose without scars or lesions, MMM CV: normal rate, no edema Respiratory: Bilateral chest rise, normal work of breathing Gastrointestinal: soft, non-tender, no palpable masses or hernias Skin: no visible lesions or rashes Psych: alert, judgement/insight appropriate, appropriate mood and affect   Test Results I personally reviewed new and old clinical labs and radiology tests Lab Results  Component Value Date   NA 122 (L) 11/29/2023   K 3.6 11/29/2023   CL 89 (L) 11/29/2023   CO2 21 (L) 11/29/2023   BUN 21 11/29/2023   CREATININE 0.84 11/29/2023   GFR 56.91 (L) 11/16/2023   CALCIUM 9.0 11/29/2023   ALBUMIN 3.1 (L) 11/29/2023   PHOS 3.1 11/29/2023    CBC Recent Labs  Lab 11/23/23 0734 11/25/23 0822 11/26/23 0500 11/27/23 0229 11/28/23 0229 11/29/23 0833  WBC 12.2* 10.1   < > 8.4  8.4 9.5  NEUTROABS 10.1* 8.1*  --   --   --  7.1  HGB 12.1 10.3*   < > 10.2* 10.3* 10.1*  HCT 34.7* 29.0*   < > 28.1* 28.7* 28.1*  MCV 93.0 94.2   < > 90.9 91.4 90.9  PLT 283 281   < > 302 344 419*   < > = values in this interval not displayed.

## 2023-11-29 NOTE — Progress Notes (Signed)
 Mobility Specialist Progress Note:   11/29/23 0931  Mobility  Activity Ambulated with assistance in hallway  Level of Assistance Contact guard assist, steadying assist  Assistive Device None  Distance Ambulated (ft) 200 ft  Activity Response Tolerated well  Mobility Referral Yes  Mobility visit 1 Mobility  Mobility Specialist Start Time (ACUTE ONLY) 0931  Mobility Specialist Stop Time (ACUTE ONLY) 0940  Mobility Specialist Time Calculation (min) (ACUTE ONLY) 9 min   Pt agreeable to mobility session. Required only minG assist to ambulate with no AD. No c/o throughout, pt back in bed with all needs met.   Addison Lank Mobility Specialist Please contact via SecureChat or  Rehab office at [phone]

## 2023-11-29 NOTE — TOC Progression Note (Addendum)
 Transition of Care [facility]) - Progression Note    Patient Details  Name: Morgan [name] MRN: [id number] Date of Birth: 1945/12/31  Transition of [facility]) CM/SW Contact  Leone Haven, RN Phone Number: 11/29/2023, 10:09 AM  Clinical Narrative:    NCM, offered choice for HHPT, patient states she does not have a preference,  NCM made referral to [facility] with Frances Furbish, he is able to take referral.  Soc will begin 24 to 48 hrs post dc.  Eliquis is new, copay is 47.00.     Expected Discharge Plan: Home/Self Care Barriers to Discharge: Continued Medical Work up  Expected Discharge Plan and Services   Discharge Planning Services: CM Consult   Living arrangements for the past 2 months: Single Family Home                                       Social Determinants of Health (SDOH) Interventions SDOH Screenings   Food Insecurity: No Food Insecurity (11/22/2023)  Housing: Low Risk  (11/22/2023)  Transportation Needs: No Transportation Needs (11/22/2023)  Utilities: Not At Risk (11/22/2023)  Alcohol Screen: Low Risk  (03/21/2023)  Depression (PHQ2-9): High Risk (05/29/2023)  Financial Resource Strain: Low Risk  (03/21/2023)  Physical Activity: Inactive (03/21/2023)  Social Connections: Moderately Isolated (11/22/2023)  Stress: No Stress Concern Present (03/21/2023)  Tobacco Use: High Risk (11/22/2023)    Readmission Risk Interventions    11/23/2023    3:51 PM  Readmission Risk Prevention Plan  Post Dischage Appt Complete  Medication Screening Complete  Transportation Screening Complete

## 2023-11-29 NOTE — Progress Notes (Signed)
 PROGRESS NOTE    [name]  ZOX:[id number] DOB: 1945/12/12 DOA: 11/21/2023 PCP: Doreene Nest, NP   Brief Narrative:  Morgan [name] is a 78 y.o. female with medical history significant for COPD, HTN, HLD, depression/anxiety, tobacco use who is admitted with acute encephalopathy in setting of hyponatremia and AKI.  Went into afib with RVR x 2, the second time with hypotension s/p DCCV, required Levophed.  She was transferred to ICU and started on amiodarone infusion and spontaneously converted to normal sinus rhythm once she is is off of the amiodarone drip she converted back to A-fib with RVR.  Patient was now transitioned to oral amiodarone after her and transferred she converted back to normal sinus rhythm and she was subsequently transferred to Cardiac Telemetry.  Cardiology has now signed off the case however her hospitalization has been complicated by hyponatremia so Nephrology has been consulted for further evaluation and management.  The patient's hyponatremia is persistent and nephrology has added urea and considering tolvaptan if she is not improving later this afternoon.  They are hoping the Goal sodium is to be 128-135 by tomorrow morning  Assessment and Plan:  Acute Delirium, improved  -Patient presenting with 4 days of progressive delirium -Negative head CT as it showed "No acute CT finding. Chronic small vessel ischemic changes of the white matter." -She was clearly altered on admission and throughout the first day of hospitalization, but this appears to be resolved -She was given Seroquel at bedtime x 2 doses -Initial concern was that this was related to hyponatremia but her Na+ improved and now worsened -Delirium possibly related to constipation but now appears to be more likely related to unrecognized afib with RVR -C/w Delirium precautions -Continue to Hold Gabapentin  -With new-onset afib, MRI was ordered and negative for acute CVA -PT/OT consulted and  recommending Home Health PT/OT   New onset Atrial Fibrillation with RVR s/p Conversion to NSR -Patient developed new-onset afib on 2/14 early morning She was given IV Metoprolol -Added Cardizem drip but she did not need it, converted spontaneously Started on heparin -> Eliquis -Echo with mild septal hypertrophy but otherwise unremarkable Cardiology consulted when she had recurrent afib with hypotension on 2/14-15 -Underwent DCCV and started on amiodarone drip in ICU, short period of pressors needed -She is now hemodynamically stable, transitioned off amiodarone drip and to PO on 2/16 -Transferred to cardiac telemetry  -Continue p.o. Amiodarone 400 g p.o. twice daily for 7 days and then 200 mg p.o. daily for 21 days -Restarted her home Metoprolol Succinate 25 mg p.o. 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 -Sodium 120 on admit compared to 128 on 11/16/2023 and previous 134 in August 2024 -Initially Suspected volume depletion given GI losses, +ketonuria on presentation -S/p 1 L normal saline given in the ED -Na+ improved but now fluctuating  -Na+ Trend: Recent Labs  Lab 11/27/23 1144 11/28/23 0229 11/28/23 1241 11/28/23 1627 11/28/23 2027 11/29/23 0115 11/29/23 0833  NA 120* 121* 121* 120* 120* 122* 122*  -Given lack of improvement with delirium, this appears to be less likely the cause -Added salt tablets on 2/17 and given a bolus on 2/18 with improvement to 120 yesterday -Given her lack of improvement we will consulted nephrology for further evaluation recommendations  and repeating a urine sodium and urine osmolality and urine creatinine; Urine Na+ was 101, Urine Cr was 58, and Urine Osm was 366 -Nephrology recommends starting Urea at 15 g po BID I have now  increased it to 30 g p.o. twice daily and now recommending changing monitoring the sodium levels from every 4 hours every 6 hours today -Continue sodium tablets 1 g 3 times daily for now and per nephrology recommend considering stopping sodium tablets when able and monitoring her neurological status closely -Per nephrology the goal sodium by tomorrow morning should be anywhere from 128-135 if necessary recommending tolvaptan if she fails to improve by this afternoon  COPD -No current evidence of decompensation  -Continue Trelegy Ellipta substitution with Breo Ellipta plus Incruse Ellipta 1 puff IH daily and continue with Xopenex 0.63 mg neb every 6 as needed for wheezing or shortness of breath SpO2: 94 % O2 Flow Rate (L/min): 2 L/min; Not on Supplemental O2 anymore  Metabolic Acidosis -Mild.  CO2 is now 21, anion gap is 12, chloride level was 89 -Repeat CMP in a.m.   Hypertension -Continue Hydralazine 25 mg po q12h -Initially Held irbesartan, Toprol XL on admission -Restarted irbesartan on 2/17 and is now being continued on 300 mg po Daily -Resume Home Metoprolol Succinate 25 mg po daily -Her BP has been poorly controlled continue monitor blood pressures per protocol -Last blood pressure reading was 136/81  Hypomagnesemia -Patient's Mag Level Trend: Recent Labs  Lab 11/29/23 0833  MG 1.5*  -Replete with IV Mag Sulfate 4 grams -Continue to Monitor and Replete as Necessary -Repeat Mag in the AM   Hyperlipidemia -C/w Simvastatin 40 mg po Daily    Depression/Anxiety -Hold Home Venlafaxine XR since hyponatremia persists -Will need outpatient further evaluation for medication adjustments -Continue with IV Lorazepam 0.5 to 1 mg every 4 as needed for anxiety   Tobacco Use -Patient reportedly smoking at least 1 pack/day -Continue nicotine patch 7 mg transdermally q. 24h as well as 2 mg of nicotine polacrilex as needed for smoking cessation -Cessation counseling given and will need to  continue reinforcement  GERD/GI Prophylaxis -Continue PPI with Pantoprazole 40 mg p.o. daily   Normocytic Anemia  -Hgb/Hct Trend: Recent Labs  Lab 11/22/23 0514 11/23/23 0734 11/25/23 0822 11/26/23 0500 11/27/23 0229 11/28/23 0229 11/29/23 0833  HGB 11.6* 12.1 10.3* 10.9* 10.2* 10.3* 10.1*  HCT 33.4* 34.7* 29.0* 31.0* 28.1* 28.7* 28.1*  MCV 94.9 93.0 94.2 94.2 90.9 91.4 90.9  -Checked Anemia Panel showed an iron level of 60, UIBC 199, TIBC 259, saturation ratios of 20%, ferritin level 276, and vitamin B12 1399 -Continue to Monitor for S/Sx of Bleeding; No overt bleeding ntoed -Repeat CBC in the AM  Hypoalbuminemia -Patient's Albumin Trending from 2.2-4.3 and today was 3.1 -Continue to Monitor and Trend and repeat CMP in the AM   DVT prophylaxis:  apixaban (ELIQUIS) tablet 5 mg    Code Status: Full Code Family Communication: No family currently at bedside  Disposition Plan:  Level of care: Telemetry Cardiac Status is: Inpatient Remains inpatient appropriate because: Needs further clinical improvement and clearance by the nephrologist   Consultants:  Cardiology PCCM transfer Nephrology  Procedures:  ECHOCARDIOGRAM IMPRESSIONS     1. Left ventricular ejection fraction, by estimation, is 60 to 65%. The  left ventricle has normal function. The left ventricle has no regional  wall motion abnormalities. There is mild left ventricular hypertrophy of  the septal segment. Left ventricular  diastolic parameters are indeterminate.  2. Right ventricular systolic function is normal. The right ventricular  size is normal. There is normal pulmonary artery systolic pressure.   3. The mitral valve is normal in structure. Trivial mitral valve  regurgitation. No evidence of mitral stenosis.   4. The aortic valve is tricuspid. Aortic valve regurgitation is not  visualized. No aortic stenosis is present.   5. The inferior vena cava is normal in size with greater than 50%   respiratory variability, suggesting right atrial pressure of 3 mmHg.   FINDINGS   Left Ventricle: Left ventricular ejection fraction, by estimation, is 60  to 65%. The left ventricle has normal function. The left ventricle has no  regional wall motion abnormalities. Strain imaging was not performed. The  left ventricular internal cavity   size was normal in size. There is mild left ventricular hypertrophy of  the septal segment. Left ventricular diastolic parameters are  indeterminate. Indeterminate filling pressures.   Right Ventricle: The right ventricular size is normal. No increase in  right ventricular wall thickness. Right ventricular systolic function is  normal. There is normal pulmonary artery systolic pressure. The tricuspid  regurgitant velocity is 2.66 m/s, and   with an assumed right atrial pressure of 3 mmHg, the estimated right  ventricular systolic pressure is 31.3 mmHg.   Left Atrium: Left atrial size was normal in size.   Right Atrium: Right atrial size was normal in size.   Pericardium: There is no evidence of pericardial effusion.   Mitral Valve: The mitral valve is normal in structure. Trivial mitral  valve regurgitation. No evidence of mitral valve stenosis.   Tricuspid Valve: The tricuspid valve is normal in structure. Tricuspid  valve regurgitation is trivial. No evidence of tricuspid stenosis.   Aortic Valve: The aortic valve is tricuspid. Aortic valve regurgitation is  not visualized. No aortic stenosis is present.   Pulmonic Valve: The pulmonic valve was normal in structure. Pulmonic valve  regurgitation is not visualized. No evidence of pulmonic stenosis.   Aorta: The aortic root is normal in size and structure.   Venous: The inferior vena cava is normal in size with greater than 50%  respiratory variability, suggesting right atrial pressure of 3 mmHg.   IAS/Shunts: No atrial level shunt detected by color flow Doppler.   Additional Comments: 3D  imaging was not performed.     LEFT VENTRICLE  PLAX 2D  LVIDd:         3.80 cm   Diastology  LVIDs:         2.00 cm   LV e' medial:    7.94 cm/s  LV PW:         1.00 cm   LV E/e' medial:  9.5  LV IVS:        1.20 cm   LV e' lateral:   9.57 cm/s  LVOT diam:     2.00 cm   LV E/e' lateral: 7.9  LV SV:         66  LV SV Index:   42  LVOT Area:     3.14 cm     RIGHT VENTRICLE             IVC  RV Basal diam:  3.50 cm     IVC diam: 1.80 cm  RV S prime:     15.90 cm/s  TAPSE (M-mode): 2.7 cm   LEFT ATRIUM             Index  RIGHT ATRIUM           Index  LA diam:        3.10 cm 2.00 cm/m   RA Area:     12.00 cm  LA Vol (A2C):   25.8 ml 16.68 ml/m  RA Volume:   27.20 ml  17.59 ml/m  LA Vol (A4C):   35.9 ml 23.21 ml/m  LA Biplane Vol: 30.8 ml 19.91 ml/m   AORTIC VALVE  LVOT Vmax:   104.00 cm/s  LVOT Vmean:  76.500 cm/s  LVOT VTI:    0.209 m    AORTA  Ao Root diam: 2.80 cm  Ao Asc diam:  3.60 cm   MITRAL VALVE               TRICUSPID VALVE  MV Area (PHT): 4.06 cm    TR Peak grad:   28.3 mmHg  MV Decel Time: 187 msec    TR Vmax:        266.00 cm/s  MV E velocity: 75.60 cm/s  MV A velocity: 74.50 cm/s  SHUNTS  MV E/A ratio:  1.01        Systemic VTI:  0.21 m                             Systemic Diam: 2.00 cm   Antimicrobials:  Anti-infectives (From admission, onward)    Start     Dose/Rate Route Frequency Ordered Stop   11/25/23 0500  vancomycin (VANCOREADY) IVPB 750 mg/150 mL  Status:  Discontinued        750 mg 150 mL/hr over 60 Minutes Intravenous Every 24 hours 11/24/23 0207 11/24/23 0318   11/24/23 0300  piperacillin-tazobactam (ZOSYN) IVPB 3.375 g  Status:  Discontinued        3.375 g 12.5 mL/hr over 240 Minutes Intravenous Every 8 hours 11/24/23 0205 11/24/23 0318   11/24/23 0300  vancomycin (VANCOREADY) IVPB 1250 mg/250 mL  Status:  Discontinued        1,250 mg 166.7 mL/hr over 90 Minutes Intravenous  Once 11/24/23 0206 11/24/23 0450   11/22/23 1500   cefTRIAXone (ROCEPHIN) 2 g in sodium chloride 0.9 % 100 mL IVPB  Status:  Discontinued        2 g 200 mL/hr over 30 Minutes Intravenous Every 24 hours 11/21/23 2008 11/24/23 0152   11/22/23 1500  doxycycline (VIBRAMYCIN) 100 mg in sodium chloride 0.9 % 250 mL IVPB  Status:  Discontinued        100 mg 125 mL/hr over 120 Minutes Intravenous Every 24 hours 11/21/23 2008 11/24/23 0318   11/21/23 1515  cefTRIAXone (ROCEPHIN) 2 g in sodium chloride 0.9 % 100 mL IVPB        2 g 200 mL/hr over 30 Minutes Intravenous  Once 11/21/23 1507 11/21/23 1620   11/21/23 1515  doxycycline (VIBRAMYCIN) 100 mg in sodium chloride 0.9 % 250 mL IVPB        100 mg 125 mL/hr over 120 Minutes Intravenous  Once 11/21/23 1507 11/21/23 1928       Subjective: Seen and examined at bedside and the patient was doing okay.  Any complaints or nausea or vomiting.  She is little hard of hearing.  No other concerns or complaints this time and no chest pain or shortness of breath.  Objective: Vitals:   11/28/23 2000 11/29/23 0000 11/29/23 0455 11/29/23 0458  BP: (!) 167/79 109/80 136/81   Pulse:  74 85 84   Resp: 18 18 20    Temp: 98.5 F (36.9 C) 98.8 F (37.1 C) 98.2 F (36.8 C)   TempSrc: Oral Oral Oral   SpO2: 98% 94% 94%   Weight:    57.2 kg  Height:        Intake/Output Summary (Last 24 hours) at 11/29/2023 1055 Last data filed at 11/29/2023 1034 Gross per 24 hour  Intake 240 ml  Output 1300 ml  Net -1060 ml   Filed Weights   11/26/23 0500 11/28/23 0412 11/29/23 0458  Weight: 60.3 kg 59 kg 57.2 kg   Examination: Physical Exam:  Constitutional: WN/WD elderly Caucasian female in no acute distress Respiratory: Diminished to auscultation bilaterally, no wheezing, rales, rhonchi or crackles. Normal respiratory effort and patient is not tachypenic. No accessory muscle use.  Unlabored breathing Cardiovascular: RRR, no murmurs / rubs / gallops. S1 and S2 auscultated. No extremity edema.  Abdomen: Soft,  non-tender, non-distended. Bowel sounds positive.  GU: Deferred. Musculoskeletal: No clubbing / cyanosis of digits/nails. No joint deformity upper and lower extremities.  Skin: No rashes, lesions, ulcers on limited skin evaluation. No induration; Warm and dry.  Neurologic: CN 2-12 grossly intact with no focal deficits. Romberg sign and cerebellar reflexes not assessed.  Psychiatric: Normal judgment and insight. Alert and oriented x 3. Normal mood and appropriate affect.   Data Reviewed: I have personally reviewed following labs and imaging studies  CBC: Recent Labs  Lab 11/23/23 0734 11/25/23 0822 11/26/23 0500 11/27/23 0229 11/28/23 0229 11/29/23 0833  WBC 12.2* 10.1 9.1 8.4 8.4 9.5  NEUTROABS 10.1* 8.1*  --   --   --  7.1  HGB 12.1 10.3* 10.9* 10.2* 10.3* 10.1*  HCT 34.7* 29.0* 31.0* 28.1* 28.7* 28.1*  MCV 93.0 94.2 94.2 90.9 91.4 90.9  PLT 283 281 258 302 344 419*   Basic Metabolic Panel: Recent Labs  Lab 11/26/23 0748 11/27/23 0229 11/27/23 1144 11/28/23 0229 11/28/23 1241 11/28/23 1627 11/28/23 2027 11/29/23 0115 11/29/23 0833  NA 119* 118* 120* 121* 121* 120* 120* 122* 122*  K 4.4 3.9 3.8 3.8  --   --   --   --  3.6  CL 88* 87* 86* 90*  --   --   --   --  89*  CO2 21* 22 22 21*  --   --   --   --  21*  GLUCOSE 133* 105* 138* 107*  --   --   --   --  205*  BUN <5* <5* 6* 6*  --   --   --   --  21  CREATININE 0.78 0.66 1.01* 0.71  --   --   --   --  0.84  CALCIUM 8.5* 8.4* 8.8* 8.5*  --   --   --   --  9.0  MG  --   --   --   --   --   --   --   --  1.5*  PHOS  --   --   --   --   --   --   --   --  3.1   GFR: Estimated Creatinine Clearance: 43 mL/min (by C-G formula based on SCr of 0.84 mg/dL). Liver Function Tests: Recent Labs  Lab 11/22/23 1618 11/24/23 0416 11/29/23 0833  AST 51* 29 26  ALT 39 25 27  ALKPHOS 63 46 61  BILITOT 0.6 0.5 0.6  PROT 6.0* 4.6* 5.6*  ALBUMIN  3.3* 2.2* 3.1*   No results for input(s): "LIPASE", "AMYLASE" in the last 168  hours. No results for input(s): "AMMONIA" in the last 168 hours. Coagulation Profile: No results for input(s): "INR", "PROTIME" in the last 168 hours. Cardiac Enzymes: No results for input(s): "CKTOTAL", "CKMB", "CKMBINDEX", "TROPONINI" in the last 168 hours. BNP (last 3 results) No results for input(s): "PROBNP" in the last 8760 hours. HbA1C: No results for input(s): "HGBA1C" in the last 72 hours. CBG: Recent Labs  Lab 11/22/23 1522  GLUCAP 168*   Lipid Profile: No results for input(s): "CHOL", "HDL", "LDLCALC", "TRIG", "CHOLHDL", "LDLDIRECT" in the last 72 hours. Thyroid Function Tests: No results for input(s): "TSH", "T4TOTAL", "FREET4", "T3FREE", "THYROIDAB" in the last 72 hours. Anemia Panel: Recent Labs    11/29/23 0833  VITAMINB12 1,399*  FERRITIN 276  TIBC 259  IRON 60  RETICCTPCT 2.1   Sepsis Labs: Recent Labs  Lab 11/23/23 0734 11/24/23 0416 11/24/23 0822  PROCALCITON 0.15  --   --   LATICACIDVEN  --  1.0 0.8   Recent Results (from the past 240 hours)  Resp panel by RT-PCR (RSV, Flu A&B, Covid) Anterior Nasal Swab     Status: None   Collection Time: 11/21/23 11:42 AM   Specimen: Anterior Nasal Swab  Result Value Ref Range Status   SARS Coronavirus 2 by RT PCR NEGATIVE NEGATIVE Final   Influenza A by PCR NEGATIVE NEGATIVE Final   Influenza B by PCR NEGATIVE NEGATIVE Final    Comment: (NOTE) The Xpert Xpress SARS-CoV-2/FLU/RSV plus assay is intended as an aid in the diagnosis of influenza from Nasopharyngeal swab specimens and should not be used as a sole basis for treatment. Nasal washings and aspirates are unacceptable for Xpert Xpress SARS-CoV-2/FLU/RSV testing.  Fact Sheet for Patients: BloggerCourse.com  Fact Sheet for Healthcare Providers: SeriousBroker.it  This test is not yet approved or cleared by the [place] FDA and has been authorized for detection and/or diagnosis of SARS-CoV-2  by FDA under an Emergency Use Authorization (EUA). This EUA will remain in effect (meaning this test can be used) for the duration of the COVID-19 declaration under Section 564(b)(1) of the Act, 21 U.S.C. section 360bbb-3(b)(1), unless the authorization is terminated or revoked.     Resp Syncytial Virus by PCR NEGATIVE NEGATIVE Final    Comment: (NOTE) Fact Sheet for Patients: BloggerCourse.com  Fact Sheet for Healthcare Providers: SeriousBroker.it  This test is not yet approved or cleared by the [place] FDA and has been authorized for detection and/or diagnosis of SARS-CoV-2 by FDA under an Emergency Use Authorization (EUA). This EUA will remain in effect (meaning this test can be used) for the duration of the COVID-19 declaration under Section 564(b)(1) of the Act, 21 U.S.C. section 360bbb-3(b)(1), unless the authorization is terminated or revoked.  Performed at [facility], [address] [place], [place] [address]   MRSA Next Gen by PCR, Nasal     Status: None   Collection Time: 11/24/23  2:51 AM   Specimen: Nasal Mucosa; Nasal Swab  Result Value Ref Range Status   MRSA by PCR Next Gen NOT DETECTED NOT DETECTED Final    Comment: (NOTE) The GeneXpert MRSA Assay (FDA approved for NASAL specimens only), is one component of a comprehensive MRSA colonization surveillance program. It is not intended to diagnose MRSA infection nor to guide or monitor treatment for MRSA infections. Test performance is not FDA approved in patients less than 50 years old. Performed at [facility]  [facility], [address], [place], [place] [address]     Radiology Studies: No results found.  Scheduled Meds:  amiodarone  400 mg Oral BID   Followed by   Melene Muller ON 12/02/2023] amiodarone  200 mg Oral Daily   apixaban  5 mg Oral BID   atorvastatin  40 mg Oral Daily   Chlorhexidine Gluconate Cloth  6 each Topical Daily   docusate sodium   100 mg Oral BID   fluticasone furoate-vilanterol  1 puff Inhalation Daily   And   umeclidinium bromide  1 puff Inhalation Daily   hydrALAZINE  25 mg Oral Q12H   irbesartan  300 mg Oral Daily   metoprolol succinate  25 mg Oral Daily   nicotine  7 mg Transdermal Daily   pantoprazole  40 mg Oral Daily   polyethylene glycol  17 g Oral Daily   urea  30 g Oral BID   Continuous Infusions:  magnesium sulfate bolus IVPB      LOS: 8 days   Marguerita Merles, DO [facility] Available via Epic secure chat 7am-7pm After these hours, please refer to coverage provider listed on [email or web address] 11/29/2023, 10:55 AM

## 2023-11-29 NOTE — Progress Notes (Signed)
 Occupational Therapy Treatment and Discharge Patient Details Name: Morgan [name] [facility] MRN: [id number] DOB: 05/17/1946 Today's Date: 11/29/2023   History of present illness Pt is 78 year old presented to [facility] on  11/21/23 for acute encephalopathy in setting of hyponatremia and AKI. Went into afib with rvr. Pt with cardioversion. PMH - copd, htn, chronic back pain, back surgery, depression/ anxiety   OT comments  Pt is functioning mod I in mobility (prefers walker) and ADLs. Knowledgeable in UB HEP and energy conservation strategies. No further OT needs.       If plan is discharge home, recommend the following:  Assist for transportation;Assistance with cooking/housework   Equipment Recommendations  None recommended by OT    Recommendations for Other Services      Precautions / Restrictions Precautions Precautions: Fall Restrictions Weight Bearing Restrictions Per Provider Order: No       Mobility Bed Mobility Overal bed mobility: Modified Independent                  Transfers Overall transfer level: Modified independent Equipment used: Rolling walker (2 wheels)                     Balance     Sitting balance-Leahy Scale: Normal       Standing balance-Leahy Scale: Fair                             ADL either performed or assessed with clinical judgement   ADL Overall ADL's : Modified independent                                            Extremity/Trunk Assessment              Vision       Perception     Praxis     Communication Communication Factors Affecting Communication: Hearing impaired   Cognition Arousal: Alert Behavior During Therapy: WFL for tasks assessed/performed Cognition: No apparent impairments                               Following commands: Intact        Cueing      Exercises      Shoulder Instructions       General Comments      Pertinent Vitals/ Pain        Pain Assessment Pain Assessment: No/denies pain  Home Living                                          Prior Functioning/Environment              Frequency           Progress Toward Goals  OT Goals(current goals can now be found in the care plan section)  Progress towards OT goals: Goals met/education completed, patient discharged from OT     Plan      Co-evaluation                 AM-PAC OT "6 Clicks" Daily Activity     Outcome Measure   Help from another person eating meals?: None Help  from another person taking care of personal grooming?: None Help from another person toileting, which includes using toliet, bedpan, or urinal?: None Help from another person bathing (including washing, rinsing, drying)?: None Help from another person to put on and taking off regular upper body clothing?: None Help from another person to put on and taking off regular lower body clothing?: None 6 Click Score: 24    End of Session    OT Visit Diagnosis: Muscle weakness (generalized) (M62.81)   Activity Tolerance Patient tolerated treatment well   Patient Left in bed;with call bell/phone within reach   Nurse Communication          Time: 1610-9604 OT Time Calculation (min): 21 min  Charges: OT General Charges $OT Visit: 1 Visit OT Treatments $Self Care/Home Management : 8-22 mins  Berna Spare, OTR/L Acute Rehabilitation Services Office: [phone]   Evern Bio 11/29/2023, 12:22 PM

## 2023-11-29 NOTE — Consult Note (Addendum)
 Value-Based [facility] Liaison Consult Note   11/29/2023  Morgan [name] [facility] 02/24/1946 [id number]  Insurance: [employer]  Primary Care Provider: Doreene Nest, NP with [facility], this provider is listed for the transition of care follow up appointments  and [facility] calls   [facility] Liaison met patient at bedside at [facility]. Admitted with delirium, multiple etiologies with new Atrial Fibrillation per MD progress notes. Patient is little hard of hearing noted but is alert and oriented. Explained reason for rounding visit and accepting for post hospital TOC calls.   The patient was screened for 7-day hospitalization which included and ICU LOC, with noted medium risk score for unplanned readmission.   The patient was assessed for potential [facility] Coordination service needs for post hospital transition for care coordination. Review of patient's electronic medical record reveals patient is from home alone. Inpatient TOC RN/team noted home with [facility] planned. Patient has been active with Pharmacist in the past.  Plan: [facility] Liaison will continue to follow progress and disposition to asess for post hospital community care coordination/management needs.  Referral request for community care coordination: Patient states no new needs.  States son will be with her for a few days and she has friends and neighbors to check on her. Son is listed but not on her current DPR as noted in electronic medical record.  Anticipate VBCI TOC follow up calls.   VBCI Community Care, Population Health does not replace or interfere with any arrangements made by the Inpatient Transition of Care team.   For questions contact:   Morgan Shanks, RN, BSN, CCM [place]  [facility], [facility] Liaison Direct Dial: [phone] or secure chat Email: [email or web address]

## 2023-11-30 DIAGNOSIS — F05 Delirium due to known physiological condition: Secondary | ICD-10-CM | POA: Diagnosis not present

## 2023-11-30 DIAGNOSIS — E871 Hypo-osmolality and hyponatremia: Secondary | ICD-10-CM | POA: Diagnosis not present

## 2023-11-30 DIAGNOSIS — N179 Acute kidney failure, unspecified: Secondary | ICD-10-CM | POA: Diagnosis not present

## 2023-11-30 DIAGNOSIS — F419 Anxiety disorder, unspecified: Secondary | ICD-10-CM | POA: Diagnosis not present

## 2023-11-30 LAB — COMPREHENSIVE METABOLIC PANEL
ALT: 21 U/L (ref 0–44)
AST: 21 U/L (ref 15–41)
Albumin: 2.9 g/dL — ABNORMAL LOW (ref 3.5–5.0)
Alkaline Phosphatase: 59 U/L (ref 38–126)
Anion gap: 10 (ref 5–15)
BUN: 74 mg/dL — ABNORMAL HIGH (ref 8–23)
CO2: 22 mmol/L (ref 22–32)
Calcium: 8.8 mg/dL — ABNORMAL LOW (ref 8.9–10.3)
Chloride: 93 mmol/L — ABNORMAL LOW (ref 98–111)
Creatinine, Ser: 0.82 mg/dL (ref 0.44–1.00)
GFR, Estimated: >60 mL/min (ref >60)
Glucose, Bld: 109 mg/dL — ABNORMAL HIGH (ref 70–99)
Potassium: 3.8 mmol/L (ref 3.5–5.1)
Sodium: 125 mmol/L — ABNORMAL LOW (ref 135–145)
Total Bilirubin: 0.3 mg/dL (ref 0.0–1.2)
Total Protein: 5.1 g/dL — ABNORMAL LOW (ref 6.5–8.1)

## 2023-11-30 LAB — CBC WITH DIFFERENTIAL/PLATELET
Abs Immature Granulocytes: 0.1 10*3/uL — ABNORMAL HIGH (ref 0.00–0.07)
Basophils Absolute: 0 10*3/uL (ref 0.0–0.1)
Basophils Relative: 0 %
Eosinophils Absolute: 0.2 10*3/uL (ref 0.0–0.5)
Eosinophils Relative: 2 %
HCT: 27.2 % — ABNORMAL LOW (ref 36.0–46.0)
Hemoglobin: 9.6 g/dL — ABNORMAL LOW (ref 12.0–15.0)
Immature Granulocytes: 1 %
Lymphocytes Relative: 14 %
Lymphs Abs: 1.3 10*3/uL (ref 0.7–4.0)
MCH: 33.2 pg (ref 26.0–34.0)
MCHC: 35.3 g/dL (ref 30.0–36.0)
MCV: 94.1 fL (ref 80.0–100.0)
Monocytes Absolute: 1.4 10*3/uL — ABNORMAL HIGH (ref 0.1–1.0)
Monocytes Relative: 15 %
Neutro Abs: 6.4 10*3/uL (ref 1.7–7.7)
Neutrophils Relative %: 68 %
Platelets: 385 10*3/uL (ref 150–400)
RBC: 2.89 MIL/uL — ABNORMAL LOW (ref 3.87–5.11)
RDW: 12.7 % (ref 11.5–15.5)
WBC: 9.3 10*3/uL (ref 4.0–10.5)
nRBC: 0 % (ref 0.0–0.2)

## 2023-11-30 LAB — MAGNESIUM: Magnesium: 2.4 mg/dL (ref 1.7–2.4)

## 2023-11-30 LAB — SODIUM
Sodium: 132 mmol/L — ABNORMAL LOW (ref 135–145)
Sodium: 135 mmol/L (ref 135–145)

## 2023-11-30 LAB — PHOSPHORUS: Phosphorus: 3.9 mg/dL (ref 2.5–4.6)

## 2023-11-30 MED ORDER — UREA 15 G PO PACK
30.0000 g | PACK | Freq: Two times a day (BID) | ORAL | Status: DC
  Filled 2023-11-30: qty 2

## 2023-11-30 MED ORDER — DEXTROSE 5 % IV SOLN: INTRAVENOUS | Status: AC

## 2023-11-30 MED ORDER — TOLVAPTAN 15 MG PO TABS
15.0000 mg | ORAL_TABLET | Freq: Once | ORAL | Status: AC
  Administered 2023-11-30: 15 mg via ORAL
  Filled 2023-11-30: qty 1

## 2023-11-30 MED ORDER — DEXTROSE 5 % IV SOLN: INTRAVENOUS | Status: DC

## 2023-11-30 MED ORDER — SODIUM CHLORIDE 0.9 % IV BOLUS
250.0000 mL | Freq: Once | INTRAVENOUS | Status: AC
  Administered 2023-11-30: 250 mL via INTRAVENOUS

## 2023-11-30 NOTE — Progress Notes (Signed)
 Physical Therapy Treatment Patient Details Name: Morgan [name] MRN: [id number] DOB: Oct 10, 1945 Today's Date: 11/30/2023   History of Present Illness Pt is 78 year old presented to [facility] on  11/21/23 for acute encephalopathy in setting of hyponatremia and AKI. Went into afib with rvr. Pt with cardioversion. PMH - copd, htn, chronic back pain, back surgery, depression/ anxiety    PT Comments  Focused session on balance and lower extremity strength and endurance training. She performed various dynamic gait challenges and scored 14/24 on the DGI today. A score of </= 19/24 on the DGI is predictive of falls in the elderly. Educated pt of her risk for falls and recs for pt to use her walker at d/c. She verbalized understanding. Pt reports she likes dancing, thus suggested for pt to enroll in dance classes after d/c in order to improve her strength, balance, and endurance. She verbalized understanding and excitement about this idea. Also, instructed pt through performing forwards/backwards and lateral steps ups/downs on the stairs to increase her lower extremity strength and aerobic endurance. Pt expressed this fatigued her, thereby successfully challenging her. Will continue to follow acutely.      If plan is discharge home, recommend the following: Assist for transportation   Can travel by private vehicle        Equipment Recommendations  None recommended by PT    Recommendations for Other Services       Precautions / Restrictions Precautions Precautions: Fall Recall of Precautions/Restrictions: Intact Restrictions Weight Bearing Restrictions Per Provider Order: No     Mobility  Bed Mobility Overal bed mobility: Modified Independent Bed Mobility: Supine to Sit     Supine to sit: Modified independent (Device/Increase time), HOB elevated     General bed mobility comments: Pt requires slightly increased time and HOB elevated.    Transfers Overall transfer level: Modified  independent Equipment used: None Transfers: Sit to/from Stand Sit to Stand: Modified independent (Device/Increase time)           General transfer comment: Slightly increased time and use of momentum to stand, no LOB    Ambulation/Gait Ambulation/Gait assistance: Contact guard assist, Min assist Gait Distance (Feet): 200 Feet (x2 bouts of ~200 ft > ~100 ft) Assistive device: None Gait Pattern/deviations: Step-through pattern, Decreased stride length, Drifts right/left, Wide base of support Gait velocity: decr Gait velocity interpretation: 1.31 - 2.62 ft/sec, indicative of limited community ambulator   General Gait Details: Pt ambulates a straight path with head forward with CGA for safety. However, when cued to turn head L <> R or look up <> down she did deviate from the path and intermittently sway laterally, needing light minA to maintain her balance.   Stairs Stairs: Yes Stairs assistance: Contact guard assist Stair Management: One rail Right, Step to pattern, Forwards, Backwards, One rail Left, Sideways Number of Stairs: 24 General stair comments: Ascends and descends forward with R rail x2 stairs, then switched to x12 (x6 with L foot on bottom step, x6 with R foot on bottom step) serial reps forward ascension then backwards descension on bottom step with R rail support, then switched to x10 serial (x5 with L foot on bottom step, x5 with R foot on bottom step) sideways ascension/descension on bottom step with bil hands on a rail   Wheelchair Mobility     Tilt Bed    Modified Rankin (Stroke Patients Only)       Balance Overall balance assessment: Needs assistance Sitting-balance support: No upper extremity supported, Feet  supported Sitting balance-Leahy Scale: Normal Sitting balance - Comments: No LOB   Standing balance support: No upper extremity supported, During functional activity Standing balance-Leahy Scale: Fair Standing balance comment: Balance deficits  noted with turning head while ambulating, CGA-minA for dynamic gait challenges without UE support                 Standardized Balance Assessment Standardized Balance Assessment : Dynamic Gait Index   Dynamic Gait Index Level Surface: Mild Impairment Change in Gait Speed: Normal Gait with Horizontal Head Turns: Moderate Impairment Gait with Vertical Head Turns: Moderate Impairment Gait and Pivot Turn: Mild Impairment Step Over Obstacle: Mild Impairment (assumed, not tested) Step Around Obstacles: Mild Impairment Steps: Moderate Impairment Total Score: 14      Communication Communication Communication: Impaired Factors Affecting Communication: Hearing impaired  Cognition Arousal: Alert Behavior During Therapy: WFL for tasks assessed/performed   PT - Cognitive impairments: No apparent impairments                         Following commands: Intact      Cueing Cueing Techniques: Verbal cues  Exercises Other Exercises Other Exercises: forward step ups, backwards step downs with each foot x6 reps bil, R UE support, CGA Other Exercises: lateral step ups/downs with bil UE support, x5 bil    General Comments General comments (skin integrity, edema, etc.): educated pt of her risk for falls and recs for pt to use her walker at d/c. She verbalized understanding. Pt reports she likes dancing, thus educated pt on enrolling in dance classes to improve her strength, balance, and endurance. She verbalized understanding and excitement about this idea.      Pertinent Vitals/Pain Pain Assessment Pain Assessment: Faces Faces Pain Scale: No hurt Pain Intervention(s): Monitored during session    Home Living                          Prior Function            PT Goals (current goals can now be found in the care plan section) Acute Rehab PT Goals Patient Stated Goal: go home PT Goal Formulation: With patient Time For Goal Achievement: 12/03/23 Potential to  Achieve Goals: Good Progress towards PT goals: Progressing toward goals    Frequency    Min 1X/week      PT Plan      Co-evaluation              AM-PAC PT "6 Clicks" Mobility   Outcome Measure  Help needed turning from your back to your side while in a flat bed without using bedrails?: None Help needed moving from lying on your back to sitting on the side of a flat bed without using bedrails?: None Help needed moving to and from a bed to a chair (including a wheelchair)?: None Help needed standing up from a chair using your arms (e.g., wheelchair or bedside chair)?: None Help needed to walk in hospital room?: A Little Help needed climbing 3-5 steps with a railing? : A Little 6 Click Score: 22    End of Session Equipment Utilized During Treatment: Gait belt Activity Tolerance: Patient tolerated treatment well Patient left: with call bell/phone within reach;in bed;with bed alarm set   PT Visit Diagnosis: Unsteadiness on feet (R26.81);Other abnormalities of gait and mobility (R26.89);Muscle weakness (generalized) (M62.81)     Time: 1438-1500 PT Time Calculation (min) (ACUTE ONLY): 22 min  Charges:    $Therapeutic Exercise: 8-22 mins PT General Charges $$ ACUTE PT VISIT: 1 Visit                     Virgil Benedict, PT, DPT Acute Rehabilitation Services  Office: [phone]    Bettina Gavia 11/30/2023, 4:57 PM

## 2023-11-30 NOTE — Progress Notes (Signed)
 Brief nephrology note.   Sodium responded robustly to tolvaptan. Now up to 135. Remove any volume restriction. Hold urea dose tonight. D5w 100cc/hr for 5 hours. Continue monitoring sodium every six hours.

## 2023-11-30 NOTE — Progress Notes (Addendum)
 Nephrology Follow-Up Consult note   Assessment/Recommendations: Morgan [name] [facility] is a/an 78 y.o. female with a past medical history significant for COPD, HTN, HLD, tobacco use disorder who presents with altered mental status c/b hyponatremia and AKI   Symptomatic Hyponatremia: more significant symptoms on admission and overall now improved.  Minimal to no symptoms at this point.  Initially treated as dehydration.  Urine studies on 2/12 were somewhat unequivocal with urine sodium of 25 and urine osmolality of 233 so treatment for dehydration seemed reasonable.  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  Possibly related to Effexor which has now been stopped. -Stop sodium tablets -Continue urea to 30 g twice daily -Monitor sodium every 6-8 hours for today -Goal sodium 132-135 by tomorrow morning -Tolvaptan 15mg  x1 -Liberalize fluid restriction to 2 L since we are giving tolvaptan.  Will require more restriction going forward -Monitor neurological status closely -Stopped Effexor    Depression: holding effexor. Consider alternate treatment outpatient   Anemia: Management per primary team   Altered mental status: Likely multifactorial with sodium contributing.  Now improved   Hypertension: Continue hydralazine 25 mg twice daily, irbesartan 300 mg daily.    A-fib with RVR: On amiodarone.  Cardiology following   Recommendations conveyed to primary service.    Darnell Level [facility] 11/30/2023 11:05 AM  ___________________________________________________________  CC: Altered mental status  Interval History/Subjective: Patient continues to feel well with no complaints.  Sodium up to 125 today.   Medications:  Current Facility-Administered Medications  Medication Dose Route Frequency Provider Last Rate Last Admin   acetaminophen (TYLENOL) tablet 650 mg  650 mg Oral  Q6H PRN Charlsie Quest, MD   650 mg at 11/23/23 2226   Or   acetaminophen (TYLENOL) suppository 650 mg  650 mg Rectal Q6H PRN Charlsie Quest, MD       amiodarone (PACERONE) tablet 400 mg  400 mg Oral BID Sande Rives, MD   400 mg at 11/30/23 0957   Followed by   Melene Muller ON 12/02/2023] amiodarone (PACERONE) tablet 200 mg  200 mg Oral Daily O'Neal, Ronnald Ramp, MD       apixaban Everlene Balls) tablet 5 mg  5 mg Oral BID Mosetta Anis, RPH   5 mg at 11/30/23 0957   atorvastatin (LIPITOR) tablet 40 mg  40 mg Oral Daily Jonah Blue, MD   40 mg at 11/30/23 0957   bisacodyl (DULCOLAX) EC tablet 5 mg  5 mg Oral Daily PRN Jonah Blue, MD       Chlorhexidine Gluconate Cloth 2 % PADS 6 each  6 each Topical Daily Agarwala, Daleen Bo, MD   6 each at 11/30/23 1002   docusate sodium (COLACE) capsule 100 mg  100 mg Oral BID Jonah Blue, MD   100 mg at 11/29/23 2236   fluticasone furoate-vilanterol (BREO ELLIPTA) 100-25 MCG/ACT 1 puff  1 puff Inhalation Daily Charlsie Quest, MD   1 puff at 11/30/23 8295   And   umeclidinium bromide (INCRUSE ELLIPTA) 62.5 MCG/ACT 1 puff  1 puff Inhalation Daily Charlsie Quest, MD   1 puff at 11/30/23 6213   hydrALAZINE (APRESOLINE) tablet 25 mg  25 mg Oral Q12H Henry Russel, MD   25 mg at 11/29/23 2236   irbesartan (AVAPRO) tablet 300 mg  300 mg Oral Daily Jonah Blue, MD   300 mg at 11/29/23 [phone]  levalbuterol (XOPENEX) nebulizer solution 0.63 mg  0.63 mg Nebulization Q6H PRN Patrici Ranks, MD       LORazepam (ATIVAN) injection 0.5-1 mg  0.5-1 mg Intravenous Q4H PRN Jonah Blue, MD       metoprolol succinate (TOPROL-XL) 24 hr tablet 25 mg  25 mg Oral Daily Marguerita Merles Hamilton, DO   25 mg at 11/29/23 4259   nicotine (NICODERM CQ - dosed in mg/24 hr) patch 7 mg  7 mg Transdermal Daily Jonah Blue, MD   7 mg at 11/30/23 5638   nicotine polacrilex (NICORETTE) gum 2 mg  2 mg Oral PRN Jonah Blue, MD       ondansetron [facility]) tablet 4 mg  4 mg  Oral Q6H PRN Charlsie Quest, MD       Or   ondansetron (ZOFRAN) injection 4 mg  4 mg Intravenous Q6H PRN Charlsie Quest, MD       Oral care mouth rinse  15 mL Mouth Rinse PRN Cheri Fowler, MD       pantoprazole (PROTONIX) EC tablet 40 mg  40 mg Oral Daily Jonah Blue, MD   40 mg at 11/30/23 0957   polyethylene glycol (MIRALAX / GLYCOLAX) packet 17 g  17 g Oral Daily Jonah Blue, MD   17 g at 11/25/23 0900   urea (URE-NA) oral packet 30 g  30 g Oral BID Darnell Level, MD   30 g at 11/30/23 7564      Review of Systems: 10 systems reviewed and negative except per interval history/subjective  Physical Exam: Vitals:   11/30/23 0738 11/30/23 0746  BP:  98/81  Pulse:  80  Resp:  18  Temp:    SpO2: 97% 94%   Total I/O In: 480 [P.O.:480] Out: -   Intake/Output Summary (Last 24 hours) at 11/30/2023 1105 Last data filed at 11/30/2023 1000 Gross per 24 hour  Intake 574.21 ml  Output --  Net 574.21 ml   Constitutional: well-appearing, no acute distress ENMT: ears and nose without scars or lesions, MMM CV: normal rate, no edema Respiratory: Bilateral chest rise, normal work of breathing Gastrointestinal: soft, non-tender, no palpable masses or hernias Skin: no visible lesions or rashes Psych: alert, judgement/insight appropriate, appropriate mood and affect   Test Results I personally reviewed new and old clinical labs and radiology tests Lab Results  Component Value Date   NA 125 (L) 11/30/2023   K 3.8 11/30/2023   CL 93 (L) 11/30/2023   CO2 22 11/30/2023   BUN 74 (H) 11/30/2023   CREATININE 0.82 11/30/2023   GFR 56.91 (L) 11/16/2023   CALCIUM 8.8 (L) 11/30/2023   ALBUMIN 2.9 (L) 11/30/2023   PHOS 3.9 11/30/2023    CBC Recent Labs  Lab 11/25/23 0822 11/26/23 0500 11/28/23 0229 11/29/23 0833 11/30/23 0239  WBC 10.1   < > 8.4 9.5 9.3  NEUTROABS 8.1*  --   --  7.1 6.4  HGB 10.3*   < > 10.3* 10.1* 9.6*  HCT 29.0*   < > 28.7* 28.1* 27.2*  MCV 94.2   <  > 91.4 90.9 94.1  PLT 281   < > 344 419* 385   < > = values in this interval not displayed.

## 2023-11-30 NOTE — Progress Notes (Signed)
 PROGRESS NOTE    Morgan [name]  ZOX:[id number] DOB: December 22, 1945 DOA: 11/21/2023 PCP: Doreene Nest, NP   Brief Narrative:  Morgan [name] is a 78 y.o. female with medical history significant for COPD, HTN, HLD, depression/anxiety, tobacco use who is admitted with acute encephalopathy in setting of hyponatremia and AKI.  Went into afib with RVR x 2, the second time with hypotension s/p DCCV, required Levophed.  She was transferred to ICU and started on amiodarone infusion and spontaneously converted to normal sinus rhythm once she is is off of the amiodarone drip she converted back to A-fib with RVR.  Patient was now transitioned to oral amiodarone after her and transferred she converted back to normal sinus rhythm and she was subsequently transferred to Cardiac Telemetry.  Cardiology has now signed off the case however her hospitalization has been complicated by hyponatremia so Nephrology has been consulted for further evaluation and management.  The patient's hyponatremia is persistent and nephrology has added urea and give it being persistently low Nephrology has ordered a dose of Tolvaptan 15 mg x1 today and liberalizing her fluid restriction to 2 Liters. Sodium Chloride tablets stopped now. Yesterday Nephrology increased her Urea to 30 g po BID.   Assessment and Plan:  Acute Delirium, improved  -Patient presenting with 4 days of progressive delirium -Negative head CT as it showed "No acute CT finding. Chronic small vessel ischemic changes of the white matter." -She was clearly altered on admission and throughout the first day of hospitalization, but this appears to be resolved -She was given Seroquel at bedtime x 2 doses -Initial concern was that this was related to hyponatremia but her Na+ improved and now worsened -Delirium possibly related to constipation but now appears to be more likely related to unrecognized afib with RVR -C/w Delirium precautions -Continue to Hold Gabapentin   -With new-onset afib, MRI was ordered and negative for acute CVA -PT/OT consulted and recommending Home Health PT/OT when medically stable for D/C   New onset Atrial Fibrillation with RVR s/p Conversion to NSR -Patient developed new-onset afib on 2/14 early morning She was given IV Metoprolol -Added Cardizem drip but she did not need it, converted spontaneously Started on heparin -> Eliquis -Echo with mild septal hypertrophy but otherwise unremarkable Cardiology consulted when she had recurrent afib with hypotension on 2/14-15 -Underwent DCCV and started on amiodarone drip in ICU, short period of pressors needed -She is now hemodynamically stable, transitioned off amiodarone drip and to PO on 2/16 -Transferred to cardiac telemetry  -Continue p.o. Amiodarone 400 g p.o. twice daily for 7 days and then 200 mg p.o. daily for 21 days -Restarted her home Metoprolol Succinate 25 mg p.o. 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 -Sodium 120 on admit compared to 128 on 11/16/2023 and previous 134 in August 2024 -Initially Suspected volume depletion given GI losses, +ketonuria on presentation -S/p 1 L normal saline given in the ED -Na+ improved but now fluctuating  -Na+ Trend: Recent Labs  Lab 11/29/23 0115 11/29/23 0833 11/29/23 1139 11/29/23 1704 11/29/23 2222 11/30/23 0239 11/30/23 1153  NA 122* 122* 125* 125* 124* 125* 132*  -Given lack of improvement with delirium, this appears to be less likely the cause -  Added salt tablets on 2/17 and given a bolus on 2/18 with improvement to 120 yesterday -Given her lack of improvement we will consulted nephrology for further evaluation recommendations and repeating a urine sodium and urine  osmolality and urine creatinine; Urine Na+ was 101, Urine Cr was 58, and Urine Osm was 366 -Nephrology recommends starting Urea 30 g p.o. twice daily and now recommending changing monitoring the sodium levels from every 4 hours every 6 hours today  -Sodium tablets 1 g 3 times daily now Discontinued  -Per nephrology the goal sodium should be anywhere from 132-135 tomorrow AM  -Given that Na+ remained on the lower side, Nephrology oing to give her a dose of Tolvaptan 15 mg po x1 today they are recommending liberalizing her fluid restriction to 2 L because she is being given tolvaptan and are without recommend that she requires more screening going forward  COPD -No current evidence of decompensation  -Continue Trelegy Ellipta substitution with Breo Ellipta plus Incruse Ellipta 1 puff IH daily and continue with Xopenex 0.63 mg neb every 6 as needed for wheezing or shortness of breath SpO2: 99 % O2 Flow Rate (L/min): 2 L/min; Not on Supplemental O2 anymore  Metabolic Acidosis -Mild and Improved.  CO2 is now 22, anion gap is 10, chloride level was 93 -Repeat CMP in a.m.   Hypertension -Continued Hydralazine 25 mg po q12h but Held this AM -Initially Held irbesartan, Toprol XL on admission -Restarted irbesartan on 2/17 and is now being continued on 300 mg po Daily but held this AM -Resume Home Metoprolol Succinate 25 mg po daily but held this AM -Her BP has been poorly controlled continue monitor blood pressures per protocol -Last blood pressure reading was on the softer side at 98/81 this AM but is now improved to 116/81  Hypomagnesemia -Patient's Mag Level Trend: Recent Labs  Lab 11/29/23 0833 11/30/23 0239  MG 1.5* 2.4  -Replete with IV Mag Sulfate 4 grams yesterday -Continue to Monitor and Replete as Necessary -Repeat Mag in the AM   Hyperlipidemia -C/w Simvastatin 40 mg po Daily    Depression/Anxiety -Hold Home Venlafaxine XR since hyponatremia persists -Will need outpatient  further evaluation for medication adjustments -Continue with IV Lorazepam 0.5 to 1 mg every 4 as needed for anxiety   Tobacco Use -Patient reportedly smoking at least 1 pack/day -Continue nicotine patch 7 mg transdermally q. 24h as well as 2 mg of nicotine polacrilex as needed for smoking cessation -Cessation counseling given and will need to continue reinforcement  GERD/GI Prophylaxis -Continue PPI with Pantoprazole 40 mg p.o. daily   Normocytic Anemia  -Hgb/Hct Trend: Recent Labs  Lab 11/23/23 0734 11/25/23 0822 11/26/23 0500 11/27/23 0229 11/28/23 0229 11/29/23 0833 11/30/23 0239  HGB 12.1 10.3* 10.9* 10.2* 10.3* 10.1* 9.6*  HCT 34.7* 29.0* 31.0* 28.1* 28.7* 28.1* 27.2*  MCV 93.0 94.2 94.2 90.9 91.4 90.9 94.1  -Checked Anemia Panel showed an iron level of 60, UIBC 199, TIBC 259, saturation ratios of 20%, ferritin level 276, and vitamin B12 1399 -Continue to Monitor for S/Sx of Bleeding; No overt bleeding ntoed -Repeat CBC in the AM  Hypoalbuminemia -Patient's Albumin Trending from 2.2-4.3 and today was 2.9 -Continue to Monitor and Trend and repeat CMP in the AM   DVT prophylaxis:  apixaban (ELIQUIS) tablet 5 mg    Code Status: Full Code Family Communication: No family present at bedside  Disposition Plan:  Level of care: Telemetry Cardiac Status is: Inpatient Remains inpatient appropriate because: Needs further  clinical improvement in Na+ and clearance by Nephrology   Consultants:  Cardiology PCCM Transfer Nephrology  Procedures:  ECHOCARDIOGRAM IMPRESSIONS     1. Left ventricular ejection fraction, by estimation, is 60 to 65%. The  left ventricle has normal function. The left ventricle has no regional  wall motion abnormalities. There is mild left ventricular hypertrophy of  the septal segment. Left ventricular  diastolic parameters are indeterminate.   2. Right ventricular systolic function is normal. The right ventricular  size is normal. There is  normal pulmonary artery systolic pressure.   3. The mitral valve is normal in structure. Trivial mitral valve  regurgitation. No evidence of mitral stenosis.   4. The aortic valve is tricuspid. Aortic valve regurgitation is not  visualized. No aortic stenosis is present.   5. The inferior vena cava is normal in size with greater than 50%  respiratory variability, suggesting right atrial pressure of 3 mmHg.   FINDINGS   Left Ventricle: Left ventricular ejection fraction, by estimation, is 60  to 65%. The left ventricle has normal function. The left ventricle has no  regional wall motion abnormalities. Strain imaging was not performed. The  left ventricular internal cavity   size was normal in size. There is mild left ventricular hypertrophy of  the septal segment. Left ventricular diastolic parameters are  indeterminate. Indeterminate filling pressures.   Right Ventricle: The right ventricular size is normal. No increase in  right ventricular wall thickness. Right ventricular systolic function is  normal. There is normal pulmonary artery systolic pressure. The tricuspid  regurgitant velocity is 2.66 m/s, and   with an assumed right atrial pressure of 3 mmHg, the estimated right  ventricular systolic pressure is 31.3 mmHg.   Left Atrium: Left atrial size was normal in size.   Right Atrium: Right atrial size was normal in size.   Pericardium: There is no evidence of pericardial effusion.   Mitral Valve: The mitral valve is normal in structure. Trivial mitral  valve regurgitation. No evidence of mitral valve stenosis.   Tricuspid Valve: The tricuspid valve is normal in structure. Tricuspid  valve regurgitation is trivial. No evidence of tricuspid stenosis.   Aortic Valve: The aortic valve is tricuspid. Aortic valve regurgitation is  not visualized. No aortic stenosis is present.   Pulmonic Valve: The pulmonic valve was normal in structure. Pulmonic valve  regurgitation is not  visualized. No evidence of pulmonic stenosis.   Aorta: The aortic root is normal in size and structure.   Venous: The inferior vena cava is normal in size with greater than 50%  respiratory variability, suggesting right atrial pressure of 3 mmHg.   IAS/Shunts: No atrial level shunt detected by color flow Doppler.   Additional Comments: 3D imaging was not performed.     LEFT VENTRICLE  PLAX 2D  LVIDd:         3.80 cm   Diastology  LVIDs:         2.00 cm   LV e' medial:    7.94 cm/s  LV PW:         1.00 cm   LV E/e' medial:  9.5  LV IVS:        1.20 cm   LV e' lateral:   9.57 cm/s  LVOT diam:     2.00 cm   LV E/e' lateral: 7.9  LV SV:         66  LV SV Index:   42  LVOT Area:  3.14 cm     RIGHT VENTRICLE             IVC  RV Basal diam:  3.50 cm     IVC diam: 1.80 cm  RV S prime:     15.90 cm/s  TAPSE (M-mode): 2.7 cm   LEFT ATRIUM             Index        RIGHT ATRIUM           Index  LA diam:        3.10 cm 2.00 cm/m   RA Area:     12.00 cm  LA Vol (A2C):   25.8 ml 16.68 ml/m  RA Volume:   27.20 ml  17.59 ml/m  LA Vol (A4C):   35.9 ml 23.21 ml/m  LA Biplane Vol: 30.8 ml 19.91 ml/m   AORTIC VALVE  LVOT Vmax:   104.00 cm/s  LVOT Vmean:  76.500 cm/s  LVOT VTI:    0.209 m    AORTA  Ao Root diam: 2.80 cm  Ao Asc diam:  3.60 cm   MITRAL VALVE               TRICUSPID VALVE  MV Area (PHT): 4.06 cm    TR Peak grad:   28.3 mmHg  MV Decel Time: 187 msec    TR Vmax:        266.00 cm/s  MV E velocity: 75.60 cm/s  MV A velocity: 74.50 cm/s  SHUNTS  MV E/A ratio:  1.01        Systemic VTI:  0.21 m                             Systemic Diam: 2.00 cm   Antimicrobials:  Anti-infectives (From admission, onward)    Start     Dose/Rate Route Frequency Ordered Stop   11/25/23 0500  vancomycin (VANCOREADY) IVPB 750 mg/150 mL  Status:  Discontinued        750 mg 150 mL/hr over 60 Minutes Intravenous Every 24 hours 11/24/23 0207 11/24/23 0318   11/24/23 0300   piperacillin-tazobactam (ZOSYN) IVPB 3.375 g  Status:  Discontinued        3.375 g 12.5 mL/hr over 240 Minutes Intravenous Every 8 hours 11/24/23 0205 11/24/23 0318   11/24/23 0300  vancomycin (VANCOREADY) IVPB 1250 mg/250 mL  Status:  Discontinued        1,250 mg 166.7 mL/hr over 90 Minutes Intravenous  Once 11/24/23 0206 11/24/23 0450   11/22/23 1500  cefTRIAXone (ROCEPHIN) 2 g in sodium chloride 0.9 % 100 mL IVPB  Status:  Discontinued        2 g 200 mL/hr over 30 Minutes Intravenous Every 24 hours 11/21/23 2008 11/24/23 0152   11/22/23 1500  doxycycline (VIBRAMYCIN) 100 mg in sodium chloride 0.9 % 250 mL IVPB  Status:  Discontinued        100 mg 125 mL/hr over 120 Minutes Intravenous Every 24 hours 11/21/23 2008 11/24/23 0318   11/21/23 1515  cefTRIAXone (ROCEPHIN) 2 g in sodium chloride 0.9 % 100 mL IVPB        2 g 200 mL/hr over 30 Minutes Intravenous  Once 11/21/23 1507 11/21/23 1620   11/21/23 1515  doxycycline (VIBRAMYCIN) 100 mg in sodium chloride 0.9 % 250 mL IVPB        100 mg 125 mL/hr over 120 Minutes Intravenous  Once 11/21/23 1507 11/21/23 1928       Subjective: Seen and examined at bedside had no complaints.  Felt good and denied any complaints with nausea or vomiting.  Continues to be hard of hearing.  No other concerns or points this time denies any chest pain or shortness of breath.  States that she is having bowel movements.  Objective: Vitals:   11/30/23 0623 11/30/23 0738 11/30/23 0746 11/30/23 1128  BP:   98/81 96/78  Pulse:   80 77  Resp:   18 18  Temp:    98.2 F (36.8 C)  TempSrc:    Oral  SpO2:  97% 94% 99%  Weight: 54.5 kg     Height:        Intake/Output Summary (Last 24 hours) at 11/30/2023 1307 Last data filed at 11/30/2023 1000 Gross per 24 hour  Intake 574.21 ml  Output --  Net 574.21 ml   Filed Weights   11/28/23 0412 11/29/23 0458 11/30/23 0623  Weight: 59 kg 57.2 kg 54.5 kg   Examination: Physical Exam:  Constitutional: WN/WD  elderly Caucasian female in no acute distress Respiratory: Diminished to auscultation bilaterally, no wheezing, rales, rhonchi or crackles. Normal respiratory effort and patient is not tachypenic. No accessory muscle use.  Unlabored breathing Cardiovascular: RRR, no murmurs / rubs / gallops. S1 and S2 auscultated. No extremity edema. 2+ pedal pulses. No carotid bruits.  Abdomen: Soft, non-tender, non-distended. Bowel sounds positive.  GU: Deferred. Musculoskeletal: No clubbing / cyanosis of digits/nails. No joint deformity upper and lower extremities.  Skin: No rashes, lesions, ulcers limited skin evaluation but does have some bruising and slight ecchymosis. No induration; Warm and dry.  Neurologic: CN 2-12 grossly intact with no focal deficits. Romberg sign and cerebellar reflexes not assessed.  Psychiatric: Normal judgment and insight. Alert and oriented x 3. Normal mood and appropriate affect.   Data Reviewed: I have personally reviewed following labs and imaging studies  CBC: Recent Labs  Lab 11/25/23 0822 11/26/23 0500 11/27/23 0229 11/28/23 0229 11/29/23 0833 11/30/23 0239  WBC 10.1 9.1 8.4 8.4 9.5 9.3  NEUTROABS 8.1*  --   --   --  7.1 6.4  HGB 10.3* 10.9* 10.2* 10.3* 10.1* 9.6*  HCT 29.0* 31.0* 28.1* 28.7* 28.1* 27.2*  MCV 94.2 94.2 90.9 91.4 90.9 94.1  PLT 281 258 302 344 419* 385   Basic Metabolic Panel: Recent Labs  Lab 11/27/23 0229 11/27/23 1144 11/28/23 0229 11/28/23 1241 11/29/23 0833 11/29/23 1139 11/29/23 1704 11/29/23 2222 11/30/23 0239 11/30/23 1153  NA 118* 120* 121*   < > 122* 125* 125* 124* 125* 132*  K 3.9 3.8 3.8  --  3.6  --   --   --  3.8  --   CL 87* 86* 90*  --  89*  --   --   --  93*  --   CO2 22 22 21*  --  21*  --   --   --  22  --   GLUCOSE 105* 138* 107*  --  205*  --   --   --  109*  --   BUN <5* 6* 6*  --  21  --   --   --  74*  --   CREATININE 0.66 1.01* 0.71  --  0.84  --   --   --  0.82  --   CALCIUM 8.4* 8.8* 8.5*  --  9.0  --    --   --  8.8*  --   MG  --   --   --   --  1.5*  --   --   --  2.4  --   PHOS  --   --   --   --  3.1  --   --   --  3.9  --    < > = values in this interval not displayed.   GFR: Estimated Creatinine Clearance: 44 mL/min (by C-G formula based on SCr of 0.82 mg/dL). Liver Function Tests: Recent Labs  Lab 11/24/23 0416 11/29/23 0833 11/30/23 0239  AST 29 26 21   ALT 25 27 21   ALKPHOS 46 61 59  BILITOT 0.5 0.6 0.3  PROT 4.6* 5.6* 5.1*  ALBUMIN 2.2* 3.1* 2.9*   No results for input(s): "LIPASE", "AMYLASE" in the last 168 hours. No results for input(s): "AMMONIA" in the last 168 hours. Coagulation Profile: No results for input(s): "INR", "PROTIME" in the last 168 hours. Cardiac Enzymes: No results for input(s): "CKTOTAL", "CKMB", "CKMBINDEX", "TROPONINI" in the last 168 hours. BNP (last 3 results) No results for input(s): "PROBNP" in the last 8760 hours. HbA1C: No results for input(s): "HGBA1C" in the last 72 hours. CBG: No results for input(s): "GLUCAP" in the last 168 hours. Lipid Profile: No results for input(s): "CHOL", "HDL", "LDLCALC", "TRIG", "CHOLHDL", "LDLDIRECT" in the last 72 hours. Thyroid Function Tests: No results for input(s): "TSH", "T4TOTAL", "FREET4", "T3FREE", "THYROIDAB" in the last 72 hours. Anemia Panel: Recent Labs    11/29/23 0833  VITAMINB12 1,399*  FOLATE 22.6  FERRITIN 276  TIBC 259  IRON 60  RETICCTPCT 2.1   Sepsis Labs: Recent Labs  Lab 11/24/23 0416 11/24/23 1610  LATICACIDVEN 1.0 0.8   Recent Results (from the past 240 hours)  Resp panel by RT-PCR (RSV, Flu A&B, Covid) Anterior Nasal Swab     Status: None   Collection Time: 11/21/23 11:42 AM   Specimen: Anterior Nasal Swab  Result Value Ref Range Status   SARS Coronavirus 2 by RT PCR NEGATIVE NEGATIVE Final   Influenza A by PCR NEGATIVE NEGATIVE Final   Influenza B by PCR NEGATIVE NEGATIVE Final    Comment: (NOTE) The Xpert Xpress SARS-CoV-2/FLU/RSV plus assay is intended as an  aid in the diagnosis of influenza from Nasopharyngeal swab specimens and should not be used as a sole basis for treatment. Nasal washings and aspirates are unacceptable for Xpert Xpress SARS-CoV-2/FLU/RSV testing.  Fact Sheet for Patients: BloggerCourse.com  Fact Sheet for Healthcare Providers: SeriousBroker.it  This test is not yet approved or cleared by the [place] FDA and has been authorized for detection and/or diagnosis of SARS-CoV-2 by FDA under an Emergency Use Authorization (EUA). This EUA will remain in effect (meaning this test can be used) for the duration of the COVID-19 declaration under Section 564(b)(1) of the Act, 21 U.S.C. section 360bbb-3(b)(1), unless the authorization is terminated or revoked.     Resp Syncytial Virus by PCR NEGATIVE NEGATIVE Final    Comment: (NOTE) Fact Sheet for Patients: BloggerCourse.com  Fact Sheet for Healthcare Providers: SeriousBroker.it  This test is not yet approved or cleared by the [place] FDA and has been authorized for detection and/or diagnosis of SARS-CoV-2 by FDA under an Emergency Use Authorization (EUA). This EUA will remain in effect (meaning this test can be used) for the duration of the COVID-19 declaration under Section 564(b)(1) of the Act, 21 U.S.C. section 360bbb-3(b)(1), unless the authorization is terminated or revoked.  Performed at [facility]  [facility], [address], [place], [place] [address]   MRSA Next Gen by PCR, Nasal     Status: None   Collection Time: 11/24/23  2:51 AM   Specimen: Nasal Mucosa; Nasal Swab  Result Value Ref Range Status   MRSA by PCR Next Gen NOT DETECTED NOT DETECTED Final    Comment: (NOTE) The GeneXpert MRSA Assay (FDA approved for NASAL specimens only), is one component of a comprehensive MRSA colonization surveillance program. It is not intended to diagnose MRSA  infection nor to guide or monitor treatment for MRSA infections. Test performance is not FDA approved in patients less than 67 years old. Performed at [name], [address], [place], [place] [address]     Radiology Studies: No results found.  Scheduled Meds:  amiodarone  400 mg Oral BID   Followed by   Melene Muller ON 12/02/2023] amiodarone  200 mg Oral Daily   apixaban  5 mg Oral BID   atorvastatin  40 mg Oral Daily   Chlorhexidine Gluconate Cloth  6 each Topical Daily   docusate sodium  100 mg Oral BID   fluticasone furoate-vilanterol  1 puff Inhalation Daily   And   umeclidinium bromide  1 puff Inhalation Daily   hydrALAZINE  25 mg Oral Q12H   irbesartan  300 mg Oral Daily   metoprolol succinate  25 mg Oral Daily   nicotine  7 mg Transdermal Daily   pantoprazole  40 mg Oral Daily   polyethylene glycol  17 g Oral Daily   urea  30 g Oral BID   Continuous Infusions:   LOS: 9 days   Marguerita Merles, DO [facility] Available via Epic secure chat 7am-7pm After these hours, please refer to coverage provider listed on [email or web address] 11/30/2023, 1:07 PM

## 2023-11-30 NOTE — Progress Notes (Signed)
 Mobility Specialist Progress Note:   11/30/23 1016  Mobility  Activity Ambulated with assistance in hallway  Level of Assistance Contact guard assist, steadying assist  Assistive Device None  Distance Ambulated (ft) 250 ft  Activity Response Tolerated well  Mobility Referral Yes  Mobility visit 1 Mobility  Mobility Specialist Start Time (ACUTE ONLY) 1016  Mobility Specialist Stop Time (ACUTE ONLY) 1027  Mobility Specialist Time Calculation (min) (ACUTE ONLY) 11 min   Pt agreeable to mobility session. Required only minG assist during ambulation with no AD use. No c/o throughout, pt back in bed with all needs met.   Addison Lank Mobility Specialist Please contact via SecureChat or  Rehab office at [phone]

## 2023-12-01 ENCOUNTER — Other Ambulatory Visit (HOSPITAL_COMMUNITY): Payer: Self-pay

## 2023-12-01 DIAGNOSIS — F05 Delirium due to known physiological condition: Secondary | ICD-10-CM | POA: Diagnosis not present

## 2023-12-01 DIAGNOSIS — N179 Acute kidney failure, unspecified: Secondary | ICD-10-CM | POA: Diagnosis not present

## 2023-12-01 DIAGNOSIS — E871 Hypo-osmolality and hyponatremia: Secondary | ICD-10-CM | POA: Diagnosis not present

## 2023-12-01 DIAGNOSIS — F419 Anxiety disorder, unspecified: Secondary | ICD-10-CM | POA: Diagnosis not present

## 2023-12-01 LAB — COMPREHENSIVE METABOLIC PANEL
ALT: 28 U/L (ref 0–44)
AST: 22 U/L (ref 15–41)
Albumin: 3.4 g/dL — ABNORMAL LOW (ref 3.5–5.0)
Alkaline Phosphatase: 64 U/L (ref 38–126)
Anion gap: 16 — ABNORMAL HIGH (ref 5–15)
BUN: 43 mg/dL — ABNORMAL HIGH (ref 8–23)
CO2: 25 mmol/L (ref 22–32)
Calcium: 9.9 mg/dL (ref 8.9–10.3)
Chloride: 95 mmol/L — ABNORMAL LOW (ref 98–111)
Creatinine, Ser: 0.91 mg/dL (ref 0.44–1.00)
GFR, Estimated: >60 mL/min (ref >60)
Glucose, Bld: 127 mg/dL — ABNORMAL HIGH (ref 70–99)
Potassium: 3.8 mmol/L (ref 3.5–5.1)
Sodium: 136 mmol/L (ref 135–145)
Total Bilirubin: 0.4 mg/dL (ref 0.0–1.2)
Total Protein: 6.1 g/dL — ABNORMAL LOW (ref 6.5–8.1)

## 2023-12-01 LAB — CBC WITH DIFFERENTIAL/PLATELET
Abs Immature Granulocytes: 0.12 10*3/uL — ABNORMAL HIGH (ref 0.00–0.07)
Basophils Absolute: 0 10*3/uL (ref 0.0–0.1)
Basophils Relative: 0 %
Eosinophils Absolute: 0.1 10*3/uL (ref 0.0–0.5)
Eosinophils Relative: 1 %
HCT: 30 % — ABNORMAL LOW (ref 36.0–46.0)
Hemoglobin: 10.5 g/dL — ABNORMAL LOW (ref 12.0–15.0)
Immature Granulocytes: 1 %
Lymphocytes Relative: 15 %
Lymphs Abs: 1.5 10*3/uL (ref 0.7–4.0)
MCH: 32.6 pg (ref 26.0–34.0)
MCHC: 35 g/dL (ref 30.0–36.0)
MCV: 93.2 fL (ref 80.0–100.0)
Monocytes Absolute: 1.3 10*3/uL — ABNORMAL HIGH (ref 0.1–1.0)
Monocytes Relative: 13 %
Neutro Abs: 6.7 10*3/uL (ref 1.7–7.7)
Neutrophils Relative %: 70 %
Platelets: 520 10*3/uL — ABNORMAL HIGH (ref 150–400)
RBC: 3.22 MIL/uL — ABNORMAL LOW (ref 3.87–5.11)
RDW: 13.1 % (ref 11.5–15.5)
WBC: 9.6 10*3/uL (ref 4.0–10.5)
nRBC: 0 % (ref 0.0–0.2)

## 2023-12-01 LAB — MAGNESIUM: Magnesium: 2.1 mg/dL (ref 1.7–2.4)

## 2023-12-01 LAB — SODIUM: Sodium: 137 mmol/L (ref 135–145)

## 2023-12-01 LAB — PHOSPHORUS: Phosphorus: 3.7 mg/dL (ref 2.5–4.6)

## 2023-12-01 MED ORDER — HYDRALAZINE HCL 25 MG PO TABS
25.0000 mg | ORAL_TABLET | Freq: Two times a day (BID) | ORAL | 0 refills | Status: DC
  Filled 2023-12-01: qty 60, 30d supply, fill #0

## 2023-12-01 MED ORDER — ATORVASTATIN CALCIUM 40 MG PO TABS
40.0000 mg | ORAL_TABLET | Freq: Every day | ORAL | 0 refills | Status: DC
  Filled 2023-12-01: qty 30, 30d supply, fill #0

## 2023-12-01 MED ORDER — NICOTINE 7 MG/24HR TD PT24
7.0000 mg | MEDICATED_PATCH | Freq: Every day | TRANSDERMAL | 0 refills | Status: DC
  Filled 2023-12-01: qty 28, 28d supply, fill #0

## 2023-12-01 MED ORDER — UREA 15 G PO PACK
15.0000 g | PACK | Freq: Two times a day (BID) | ORAL | Status: DC
  Filled 2023-12-01: qty 1

## 2023-12-01 MED ORDER — SODIUM CHLORIDE 1 G PO TABS
1.0000 g | ORAL_TABLET | Freq: Two times a day (BID) | ORAL | 0 refills | Status: DC
  Filled 2023-12-01: qty 60, 30d supply, fill #0

## 2023-12-01 MED ORDER — APIXABAN 5 MG PO TABS
5.0000 mg | ORAL_TABLET | Freq: Two times a day (BID) | ORAL | 0 refills | Status: DC
  Filled 2023-12-01: qty 60, 30d supply, fill #0

## 2023-12-01 MED ORDER — ONDANSETRON HCL 4 MG PO TABS
4.0000 mg | ORAL_TABLET | Freq: Four times a day (QID) | ORAL | 0 refills | Status: DC | PRN
  Filled 2023-12-01: qty 20, 5d supply, fill #0

## 2023-12-01 MED ORDER — AMIODARONE HCL 200 MG PO TABS
ORAL_TABLET | ORAL | 0 refills | Status: DC
  Filled 2023-12-01: qty 34, 31d supply, fill #0

## 2023-12-01 MED ORDER — SODIUM CHLORIDE 1 G PO TABS
1.0000 g | ORAL_TABLET | Freq: Two times a day (BID) | ORAL | Status: DC
  Administered 2023-12-01: 1 g via ORAL
  Filled 2023-12-01: qty 1

## 2023-12-01 MED ORDER — ACETAMINOPHEN 325 MG PO TABS
650.0000 mg | ORAL_TABLET | Freq: Four times a day (QID) | ORAL | 0 refills | Status: AC | PRN
  Filled 2023-12-01: qty 20, 3d supply, fill #0

## 2023-12-01 NOTE — Plan of Care (Signed)

## 2023-12-01 NOTE — Progress Notes (Signed)
 Mobility Specialist Progress Note;   12/01/23 1050  Mobility  Activity Ambulated with assistance in hallway  Level of Assistance Contact guard assist, steadying assist  Assistive Device Front wheel walker  Distance Ambulated (ft) 200 ft  Activity Response Tolerated well  Mobility Referral Yes  Mobility visit 1 Mobility  Mobility Specialist Start Time (ACUTE ONLY) 1050  Mobility Specialist Stop Time (ACUTE ONLY) 1102  Mobility Specialist Time Calculation (min) (ACUTE ONLY) 12 min   Pt agreeable to mobility. Required MinG assistance during ambulation for safety. Requested to use RW to ambulate this session d/t feeling a bit shaky. VSS throughout and no other c/o. Pt returned back to bed with all needs met, alarm on.   Caesar Bookman Mobility Specialist Please contact via SecureChat or [employer] [phone]

## 2023-12-01 NOTE — TOC Transition Note (Signed)
 Transition of Care [facility]) - Discharge Note   Patient Details  Name: Morgan [name] MRN: [id number] Date of Birth: 01/28/1946  [facility] CM/SW Contact:  Lawerance Sabal, RN Phone Number: 12/01/2023, 1:51 PM   Clinical Narrative:      Nils Flack that patient will DC today    Barriers to Discharge: Continued Medical Work up   Patient Goals and CMS Choice Patient states their goals for this hospitalization and ongoing recovery are:: wants to get better          Discharge Placement                       Discharge Plan and Services Additional resources added to the After Visit Summary for     Discharge Planning Services: CM Consult                                 Social Drivers of Health (SDOH) Interventions SDOH Screenings   Food Insecurity: No Food Insecurity (11/22/2023)  Housing: Low Risk  (11/22/2023)  Transportation Needs: No Transportation Needs (11/22/2023)  Utilities: Not At Risk (11/22/2023)  Alcohol Screen: Low Risk  (03/21/2023)  Depression (PHQ2-9): High Risk (05/29/2023)  Financial Resource Strain: Low Risk  (03/21/2023)  Physical Activity: Inactive (03/21/2023)  Social Connections: Moderately Isolated (11/22/2023)  Stress: No Stress Concern Present (03/21/2023)  Tobacco Use: High Risk (11/22/2023)     Readmission Risk Interventions    11/23/2023    3:51 PM  Readmission Risk Prevention Plan  Post Dischage Appt Complete  Medication Screening Complete  Transportation Screening Complete

## 2023-12-01 NOTE — Progress Notes (Signed)
 Nephrology Follow-Up Consult note   Assessment/Recommendations: Morgan [name] is a/an 78 y.o. female with a past medical history significant for COPD, HTN, HLD, tobacco use disorder who presents with altered mental status c/b hyponatremia and AKI   Symptomatic Hyponatremia: Initially with some dehydration component then elucidated SIADH.  Possibly from Effexor -Sodium now much improved with tolvaptan x 1 on 2/21 -Continue sodium tablet 1 g twice daily -Repeat sodium at 10 AM -If sodium has not increased significantly she would be okay for discharge -We will set up labs on Tuesday as well as follow-up in our office -Stopped Effexor    Depression: holding effexor. Consider alternate treatment outpatient   Anemia: Management per primary team   Altered mental status: Likely multifactorial with sodium contributing.  Now improved   Hypertension: Continue hydralazine 25 mg twice daily, irbesartan 300 mg daily.    A-fib with RVR: On amiodarone.  Cardiology following   Recommendations conveyed to primary service.    Darnell Level [facility] 12/01/2023 9:47 AM  ___________________________________________________________  CC: Altered mental status  Interval History/Subjective: Feels well today with no complaints.  Sodium up to 136.  Goal was around 133 but this level is acceptable.   Medications:  Current Facility-Administered Medications  Medication Dose Route Frequency Provider Last Rate Last Admin   acetaminophen (TYLENOL) tablet 650 mg  650 mg Oral Q6H PRN Charlsie Quest, MD   650 mg at 11/23/23 2226   Or   acetaminophen (TYLENOL) suppository 650 mg  650 mg Rectal Q6H PRN Charlsie Quest, MD       amiodarone (PACERONE) tablet 400 mg  400 mg Oral BID Sande Rives, MD   400 mg at 12/01/23 1914   Followed by   Melene Muller ON 12/02/2023] amiodarone (PACERONE) tablet 200 mg  200 mg Oral Daily O'Neal, Ronnald Ramp, MD       apixaban Everlene Balls) tablet 5 mg   5 mg Oral BID Mosetta Anis, RPH   5 mg at 12/01/23 7829   atorvastatin (LIPITOR) tablet 40 mg  40 mg Oral Daily Jonah Blue, MD   40 mg at 12/01/23 0944   bisacodyl (DULCOLAX) EC tablet 5 mg  5 mg Oral Daily PRN Jonah Blue, MD       Chlorhexidine Gluconate Cloth 2 % PADS 6 each  6 each Topical Daily Agarwala, Daleen Bo, MD   6 each at 12/01/23 0945   docusate sodium (COLACE) capsule 100 mg  100 mg Oral BID Jonah Blue, MD   100 mg at 11/30/23 2130   fluticasone furoate-vilanterol (BREO ELLIPTA) 100-25 MCG/ACT 1 puff  1 puff Inhalation Daily Charlsie Quest, MD   1 puff at 12/01/23 0845   And   umeclidinium bromide (INCRUSE ELLIPTA) 62.5 MCG/ACT 1 puff  1 puff Inhalation Daily Charlsie Quest, MD   1 puff at 12/01/23 0846   hydrALAZINE (APRESOLINE) tablet 25 mg  25 mg Oral Q12H Henry Russel, MD   25 mg at 12/01/23 0944   irbesartan (AVAPRO) tablet 300 mg  300 mg Oral Daily Jonah Blue, MD   300 mg at 12/01/23 0944   levalbuterol (XOPENEX) nebulizer solution 0.63 mg  0.63 mg Nebulization Q6H PRN Patrici Ranks, MD       LORazepam (ATIVAN) injection 0.5-1 mg  0.5-1 mg Intravenous Q4H PRN Jonah Blue, MD       metoprolol succinate (TOPROL-XL) 24 hr tablet 25 mg  25 mg Oral Daily Marguerita Merles [place], DO  25 mg at 12/01/23 0944   nicotine (NICODERM CQ - dosed in mg/24 hr) patch 7 mg  7 mg Transdermal Daily Jonah Blue, MD   7 mg at 11/30/23 1610   nicotine polacrilex (NICORETTE) gum 2 mg  2 mg Oral PRN Jonah Blue, MD       ondansetron [facility]) tablet 4 mg  4 mg Oral Q6H PRN Charlsie Quest, MD       Or   ondansetron (ZOFRAN) injection 4 mg  4 mg Intravenous Q6H PRN Charlsie Quest, MD       Oral care mouth rinse  15 mL Mouth Rinse PRN Cheri Fowler, MD       pantoprazole (PROTONIX) EC tablet 40 mg  40 mg Oral Daily Jonah Blue, MD   40 mg at 12/01/23 0944   polyethylene glycol (MIRALAX / GLYCOLAX) packet 17 g  17 g Oral Daily Jonah Blue, MD   17 g at 11/25/23  0900   sodium chloride tablet 1 g  1 g Oral BID WC Darnell Level, MD   1 g at 12/01/23 9604      Review of Systems: 10 systems reviewed and negative except per interval history/subjective  Physical Exam: Vitals:   12/01/23 0438 12/01/23 0735  BP:  127/66  Pulse: 80 84  Resp:  20  Temp:  98.6 F (37 C)  SpO2: 94% 94%   No intake/output data recorded.  Intake/Output Summary (Last 24 hours) at 12/01/2023 0947 Last data filed at 11/30/2023 2130 Gross per 24 hour  Intake 480 ml  Output --  Net 480 ml   Constitutional: well-appearing, no acute distress ENMT: ears and nose without scars or lesions, MMM CV: normal rate, no edema Respiratory: Bilateral chest rise, normal work of breathing Gastrointestinal: soft, non-tender, no palpable masses or hernias Skin: no visible lesions or rashes Psych: alert, judgement/insight appropriate, appropriate mood and affect   Test Results I personally reviewed new and old clinical labs and radiology tests Lab Results  Component Value Date   NA 136 12/01/2023   K 3.8 12/01/2023   CL 95 (L) 12/01/2023   CO2 25 12/01/2023   BUN 43 (H) 12/01/2023   CREATININE 0.91 12/01/2023   GFR 56.91 (L) 11/16/2023   CALCIUM 9.9 12/01/2023   ALBUMIN 3.4 (L) 12/01/2023   PHOS 3.7 12/01/2023    CBC Recent Labs  Lab 11/29/23 0833 11/30/23 0239 12/01/23 0229  WBC 9.5 9.3 9.6  NEUTROABS 7.1 6.4 6.7  HGB 10.1* 9.6* 10.5*  HCT 28.1* 27.2* 30.0*  MCV 90.9 94.1 93.2  PLT 419* 385 520*

## 2023-12-01 NOTE — Discharge Summary (Signed)
 Physician Discharge Summary   Patient: Morgan [name] [facility] MRN: [id number] DOB: Jul 24, 1946  Admit date:     11/21/2023  Discharge date: 12/01/23  Discharge Physician: Marguerita Merles, DO   PCP: Doreene Nest, NP   Recommendations at discharge:   Follow-up with PCP within 1 to 2 weeks repeat CBC, CMP, mag, Phos within 1 week Follow-up with cardiology in outpatient setting continue with amiodarone taper Follow-up with nephrology in outpatient setting within 1 to 2 weeks  Discharge Diagnoses: Principal Problem:   Delirium due to multiple etiologies, acute, hyperactive Active Problems:   Hyponatremia   AKI (acute kidney injury) (HCC)   Hyperlipidemia   Essential hypertension   Anxiety and depression   Constipation   COPD (chronic obstructive pulmonary disease) (HCC)   New onset atrial fibrillation (HCC)  Resolved Problems:   * No resolved hospital problems. *  Hospital Course: Morgan [name] is a 78 y.o. female with medical history significant for COPD, HTN, HLD, depression/anxiety, tobacco use who is admitted with acute encephalopathy in setting of hyponatremia and AKI.  Went into afib with RVR x 2, the second time with hypotension s/p DCCV, required Levophed.  She was transferred to ICU and started on amiodarone infusion and spontaneously converted to normal sinus rhythm once she is is off of the amiodarone drip she converted back to A-fib with RVR.  Patient was now transitioned to oral amiodarone after her and transferred she converted back to normal sinus rhythm and she was subsequently transferred to Cardiac Telemetry.  Cardiology has now signed off the case however her hospitalization has been complicated by hyponatremia so Nephrology has been consulted for further evaluation and management.  The patient's hyponatremia is persistent and nephrology has added urea and give it being persistently low Nephrology has ordered a dose of Tolvaptan 15 mg x1 today and liberalizing her fluid  restriction to 2 Liters. Sodium Chloride tablets stopped now.  The day before yesterday Nephrology increased her Urea to 30 g po BID. now the urea has been stopped and nephrology is recommending salt tablets again for discharge.  Her sodium is improved and she is back to her baseline and heart rate is controlled.  She will need to follow-up with PCP, nephrology, and cardiology outpatient setting.  Assessment and Plan:  Acute Delirium, improved  -Patient presenting with 4 days of progressive delirium -Negative head CT as it showed "No acute CT finding. Chronic small vessel ischemic changes of the white matter." -She was clearly altered on admission and throughout the first day of hospitalization, but this appears to be resolved -She was given Seroquel at bedtime x 2 doses -Initial concern was that this was related to hyponatremia but her Na+ improved and now worsened -Delirium possibly related to constipation but now appears to be more likely related to unrecognized afib with RVR -C/w Delirium precautions -Continue to Hold Gabapentin  -With new-onset afib, MRI was ordered and negative for acute CVA -PT/OT consulted and recommending Home Health PT/OT when medically stable for D/C and she is medically stable for discharge today. -Delirium is much improved and she is awake and alert and oriented x 4 and joking   New onset Atrial Fibrillation with RVR s/p Conversion to NSR -Patient developed new-onset afib on 2/14 early morning She was given IV Metoprolol -Added Cardizem drip but she did not need it, converted spontaneously Started on heparin -> Eliquis -Echo with mild septal hypertrophy but otherwise unremarkable Cardiology consulted when she had recurrent afib with hypotension on  2/14-15 -Underwent DCCV and started on amiodarone drip in ICU, short period of pressors needed -She is now hemodynamically stable, transitioned off amiodarone drip and to PO on 2/16 -Transferred to cardiac telemetry   -Continue p.o. Amiodarone 400 g p.o. twice daily for 7 days and then 200 mg p.o. daily for 21 days -Restarted her home Metoprolol Succinate 25 mg p.o. 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 -Sodium 120 on admit compared to 128 on 11/16/2023 and previous 134 in August 2024 -Initially Suspected volume depletion given GI losses, +ketonuria on presentation -S/p 1 L normal saline given in the ED -Na+ improved but now fluctuating  -Na+ Trend: Recent Labs  Lab 11/29/23 1704 11/29/23 2222 11/30/23 0239 11/30/23 1153 11/30/23 1755 12/01/23 0229 12/01/23 1031  NA 125* 124* 125* 132* 135 136 137  -Given lack of improvement with delirium, this appears to be less likely the cause -Added salt tablets on 2/17 and given a bolus on 2/18 with improvement to 120 yesterday -Given her lack of improvement we will consulted nephrology for further evaluation recommendations and repeating a urine sodium and urine osmolality and urine creatinine; Urine Na+ was 101, Urine Cr was 58, and Urine Osm was 366 -Nephrology recommends starting Urea 30 g p.o. twice daily and now recommending changing monitoring the sodium levels from every 4 hours every 6 hours today  -Sodium tablets 1 g 3 times daily now Discontinued  -Per nephrology the goal sodium should be anywhere from 132-135 tomorrow AM  -Given that Na+ remained on the lower side, Nephrology oing to give her a dose of Tolvaptan 15 mg po x1 today they are recommending liberalizing her fluid restriction to 2 L because she is being given tolvaptan and are without recommend that she requires more screening going  forward -Patient's sodium robustly improved and is now 137 at the time of discharge.  Nephrology has now taken off for fluid restriction and recommending salt tablets for discharge for at least a month and they will follow-up with her in 1 to 2 weeks.  She is cleared from their perspective for discharge at this time.  COPD -No current evidence of decompensation  -Continue Trelegy Ellipta substitution with Breo Ellipta plus Incruse Ellipta 1 puff IH daily and continue with Xopenex 0.63 mg neb every 6 as needed for wheezing or shortness of breath SpO2: 95 % O2 Flow Rate (L/min): 2 L/min; Not on Supplemental O2 anymore and weaned off of oxygen  Metabolic Acidosis -Mild and Improved.  CO2 is now 25, anion gap is 16, chloride level was 95 -Repeat CMP in a.m.   Hypertension -Continued Hydralazine 25 mg po q12h but Held this AM -Initially Held irbesartan, Toprol XL on admission -Restarted irbesartan on 2/17 and is now being continued on 300 mg po Daily but held this AM -Resume Home Metoprolol Succinate 25 mg po daily but held this AM -Her BP has been poorly controlled continue monitor blood pressures per protocol -Last blood pressure reading was on the softer side at 98/81 this AM but is now improved to 116/81  Hypomagnesemia -Patient's Mag Level Trend: Recent Labs  Lab 11/29/23 0833 11/30/23 0239 12/01/23  0229  MG 1.5* 2.4 2.1  -Continue to Monitor and Replete as Necessary -Repeat Mag in the AM   Hyperlipidemia -C/w Simvastatin 40 mg po Daily    Depression/Anxiety -Discontinued Home Venlafaxine XR since hyponatremia persists -Will need outpatient further evaluation for medication adjustments -Continue with IV Lorazepam 0.5 to 1 mg every 4 as needed for anxiety   Tobacco Use -Patient reportedly smoking at least 1 pack/day -Continue nicotine patch 7 mg transdermally q. 24h as well as 2 mg of nicotine polacrilex as needed for smoking cessation -Cessation counseling given and will  need to continue reinforcement  GERD/GI Prophylaxis -Continue PPI with Pantoprazole 40 mg p.o. daily   Normocytic Anemia  -Hgb/Hct Trend: Recent Labs  Lab 11/25/23 0822 11/26/23 0500 11/27/23 0229 11/28/23 0229 11/29/23 0833 11/30/23 0239 12/01/23 0229  HGB 10.3* 10.9* 10.2* 10.3* 10.1* 9.6* 10.5*  HCT 29.0* 31.0* 28.1* 28.7* 28.1* 27.2* 30.0*  MCV 94.2 94.2 90.9 91.4 90.9 94.1 93.2  -Checked Anemia Panel showed an iron level of 60, UIBC 199, TIBC 259, saturation ratios of 20%, ferritin level 276, and vitamin B12 1399 -Continue to Monitor for S/Sx of Bleeding; No overt bleeding ntoed -Repeat CBC in the AM  Hypoalbuminemia -Patient's Albumin Trending from 2.2-4.3 and today was 3.4 -Continue to Monitor and Trend and repeat CMP in the AM  Consultants: Cardiology, Nephrology Procedures performed: As delineated as above Disposition: Home health Diet recommendation:  Regular diet DISCHARGE MEDICATION: Allergies as of 12/01/2023       Reactions   Hctz [hydrochlorothiazide] Other (See Comments)   Hyponatremia    Amlodipine Swelling   Ankle/leg swelling   Azithromycin Nausea And Vomiting   Codeine Itching   Makes me crazy        Medication List     STOP taking these medications    ondansetron 4 MG disintegrating tablet Commonly known as: ZOFRAN-ODT   simvastatin 40 MG tablet Commonly known as: ZOCOR   venlafaxine XR 150 MG 24 hr capsule Commonly known as: EFFEXOR-XR       TAKE these medications    acetaminophen 325 MG tablet Commonly known as: TYLENOL Take 2 tablets (650 mg total) by mouth every 6 (six) hours as needed for mild pain (pain score 1-3) (or Fever >/= 101).   albuterol 108 (90 Base) MCG/ACT inhaler Commonly known as: VENTOLIN HFA INHALE 2 PUFFS BY MOUTH EVERY 4 HOURS AS NEEDED FOR WHEEZE OR FOR SHORTNESS OF BREATH   amiodarone 200 MG tablet Commonly known as: PACERONE Take 2 tablets (400 mg total) by mouth 2 (two) times daily for 1 day,  THEN take 1 tablet (200 mg total) daily. Start taking on: December 01, 2023   atorvastatin 40 MG tablet Commonly known as: LIPITOR Take 1 tablet (40 mg total) by mouth daily.   Cholecalciferol 125 MCG (5000 UT) capsule Take 5,000 Units by mouth daily.   Eliquis 5 MG Tabs tablet Generic drug: apixaban Take 1 tablet (5 mg total) by mouth 2 (two) times daily.   fluticasone 50 MCG/ACT nasal spray Commonly known as: FLONASE PLACE 1 SPRAY INTO BOTH NOSTRILS 2 (TWO) TIMES DAILY AS NEEDED FOR ALLERGIES OR RHINITIS.   gabapentin 300 MG capsule Commonly known as: NEURONTIN Take 300 mg by mouth 2 (two) times daily as needed.   hydrALAZINE 25 MG tablet Commonly known as: APRESOLINE Take 1 tablet (25 mg total) by mouth every 12 (twelve) hours. What changed:  medication strength how much to take how to take this when to  take this additional instructions   ipratropium-albuterol 0.5-2.5 (3) MG/3ML Soln Commonly known as: DUONEB Take 3 mLs by nebulization every 6 (six) hours as needed. What changed: reasons to take this   irbesartan 300 MG tablet Commonly known as: AVAPRO Take 1 tablet (300 mg total) by mouth daily. For blood pressure.   metoprolol succinate 25 MG 24 hr tablet Commonly known as: TOPROL-XL Take 1 tablet (25 mg total) by mouth daily. For blood pressure.   Multivitamin Gummies Adults Chew Chew 2 tablets by mouth daily.   nicotine 7 mg/24hr patch Commonly known as: NICODERM CQ - dosed in mg/24 hr Place 1 patch (7 mg total) onto the skin daily.   omeprazole 20 MG capsule Commonly known as: PRILOSEC Take 1 capsule by mouth every day for heartburn   ondansetron 4 MG tablet Commonly known as: ZOFRAN Take 1 tablet (4 mg total) by mouth every 6 (six) hours as needed for nausea.   polyethylene glycol powder 17 GM/SCOOP powder Commonly known as: GLYCOLAX/MIRALAX Mix 17 g into water once to twice daily as needed for constipation.   sodium chloride 1 g tablet Take 1  tablet (1 g total) by mouth 2 (two) times daily with a meal.   Trelegy Ellipta 100-62.5-25 MCG/ACT Aepb Generic drug: Fluticasone-Umeclidin-Vilant Inhale 1 puff into the lungs daily.        Follow-up Information     Doreene Nest, NP Follow up on 12/05/2023.   Specialty: Internal Medicine Why: 8:40 am hospital follow up Contact information: [address] [place] [address] [phone]         [facility], [facility] Follow up.   Specialty: Home Health Services Why: Agency will call you to set up apt times Contact information: [address] [place] [address] [phone]                Discharge Exam: Filed Weights   11/29/23 0458 11/30/23 0623 12/01/23 0436  Weight: 57.2 kg 54.5 kg 56.7 kg   Vitals:   12/01/23 0735 12/01/23 1210  BP: 127/66 (!) 105/91  Pulse: 84 85  Resp: 20 20  Temp: 98.6 F (37 C) 98 F (36.7 C)  SpO2: 94% 95%   Examination: Physical Exam:  Constitutional: WN/WD Caucasian female who is elderly in no acute distress Respiratory: Diminished to auscultation bilaterally, no wheezing, rales, rhonchi or crackles. Normal respiratory effort and patient is not tachypenic. No accessory muscle use.  Unlabored breathing Cardiovascular: RRR, no murmurs / rubs / gallops. S1 and S2 auscultated. No extremity edema.  Abdomen: Soft, non-tender, distended secondary body habitus. Bowel sounds positive.  GU: Deferred. Musculoskeletal: No clubbing / cyanosis of digits/nails. No joint deformity upper and lower extremities. Skin: No rashes, lesions, ulcers limited skin evaluation. No induration; Warm and dry.  Neurologic: CN 2-12 grossly intact with no focal deficits.  Romberg sign and cerebellar reflexes not assessed.  Psychiatric: Normal judgment and insight. Alert and oriented x 3. Normal mood and appropriate affect.   Condition at discharge: stable  The results of significant diagnostics from this hospitalization (including  imaging, microbiology, ancillary and laboratory) are listed below for reference.   Imaging Studies: MR BRAIN WO CONTRAST Result Date: 11/23/2023 CLINICAL DATA:  Delirium EXAM: MRI HEAD WITHOUT CONTRAST TECHNIQUE: Multiplanar, multiecho pulse sequences of the brain and surrounding structures were obtained without intravenous contrast. COMPARISON:  Head CT 11/21/2023 FINDINGS: Brain: Diffusion imaging does not show any acute or subacute infarction or other cause of restricted  diffusion. Chronic small-vessel ischemic changes affect pons. No focal cerebellar finding. Cerebral hemispheres show moderate chronic small-vessel ischemic change of the white matter. No cortical or large vessel territory infarction. No mass lesion, hemorrhage, hydrocephalus or extra-axial collection. Vascular: Major vessels at the base of the brain show flow. Skull and upper cervical spine: Negative Sinuses/Orbits: Clear/normal Other: None IMPRESSION: No acute or reversible finding. Moderate chronic small-vessel ischemic changes of the pons and cerebral hemispheric white matter. Electronically Signed   By: Paulina Fusi M.D.   On: 11/23/2023 17:25   ECHOCARDIOGRAM COMPLETE Result Date: 11/23/2023    ECHOCARDIOGRAM REPORT   Patient Name:   Morgan [name] [facility] Date of Exam: 11/23/2023 Medical Rec #:  [id number]       Height:       61.0 in Accession #:    [id number]      Weight:       125.0 lb Date of Birth:  October 23, 1945      BSA:          1.547 m Patient Age:    77 years        BP:           158/104 mmHg Patient Gender: F               HR:           81 bpm. Exam Location:  Inpatient Procedure: 2D Echo, Cardiac Doppler and Color Doppler (Both Spectral and Color            Flow Doppler were utilized during procedure). Indications:    Atrial Fibrillation I48.91  History:        Patient has no prior history of Echocardiogram examinations.                 COPD; Risk Factors:Hypertension and Diabetes.  Sonographer:    Webb Laws Referring Phys:  18 ARSHAD N KAKRAKANDY IMPRESSIONS  1. Left ventricular ejection fraction, by estimation, is 60 to 65%. The left ventricle has normal function. The left ventricle has no regional wall motion abnormalities. There is mild left ventricular hypertrophy of the septal segment. Left ventricular diastolic parameters are indeterminate.  2. Right ventricular systolic function is normal. The right ventricular size is normal. There is normal pulmonary artery systolic pressure.  3. The mitral valve is normal in structure. Trivial mitral valve regurgitation. No evidence of mitral stenosis.  4. The aortic valve is tricuspid. Aortic valve regurgitation is not visualized. No aortic stenosis is present.  5. The inferior vena cava is normal in size with greater than 50% respiratory variability, suggesting right atrial pressure of 3 mmHg. FINDINGS  Left Ventricle: Left ventricular ejection fraction, by estimation, is 60 to 65%. The left ventricle has normal function. The left ventricle has no regional wall motion abnormalities. Strain imaging was not performed. The left ventricular internal cavity  size was normal in size. There is mild left ventricular hypertrophy of the septal segment. Left ventricular diastolic parameters are indeterminate. Indeterminate filling pressures. Right Ventricle: The right ventricular size is normal. No increase in right ventricular wall thickness. Right ventricular systolic function is normal. There is normal pulmonary artery systolic pressure. The tricuspid regurgitant velocity is 2.66 m/s, and  with an assumed right atrial pressure of 3 mmHg, the estimated right ventricular systolic pressure is 31.3 mmHg. Left Atrium: Left atrial size was normal in size. Right Atrium: Right atrial size was normal in size. Pericardium: There is no evidence of pericardial effusion. Mitral Valve: The  mitral valve is normal in structure. Trivial mitral valve regurgitation. No evidence of mitral valve stenosis. Tricuspid  Valve: The tricuspid valve is normal in structure. Tricuspid valve regurgitation is trivial. No evidence of tricuspid stenosis. Aortic Valve: The aortic valve is tricuspid. Aortic valve regurgitation is not visualized. No aortic stenosis is present. Pulmonic Valve: The pulmonic valve was normal in structure. Pulmonic valve regurgitation is not visualized. No evidence of pulmonic stenosis. Aorta: The aortic root is normal in size and structure. Venous: The inferior vena cava is normal in size with greater than 50% respiratory variability, suggesting right atrial pressure of 3 mmHg. IAS/Shunts: No atrial level shunt detected by color flow Doppler. Additional Comments: 3D imaging was not performed.  LEFT VENTRICLE PLAX 2D LVIDd:         3.80 cm   Diastology LVIDs:         2.00 cm   LV e' medial:    7.94 cm/s LV PW:         1.00 cm   LV E/e' medial:  9.5 LV IVS:        1.20 cm   LV e' lateral:   9.57 cm/s LVOT diam:     2.00 cm   LV E/e' lateral: 7.9 LV SV:         66 LV SV Index:   42 LVOT Area:     3.14 cm  RIGHT VENTRICLE             IVC RV Basal diam:  3.50 cm     IVC diam: 1.80 cm RV S prime:     15.90 cm/s TAPSE (M-mode): 2.7 cm LEFT ATRIUM             Index        RIGHT ATRIUM           Index LA diam:        3.10 cm 2.00 cm/m   RA Area:     12.00 cm LA Vol (A2C):   25.8 ml 16.68 ml/m  RA Volume:   27.20 ml  17.59 ml/m LA Vol (A4C):   35.9 ml 23.21 ml/m LA Biplane Vol: 30.8 ml 19.91 ml/m  AORTIC VALVE LVOT Vmax:   104.00 cm/s LVOT Vmean:  76.500 cm/s LVOT VTI:    0.209 m  AORTA Ao Root diam: 2.80 cm Ao Asc diam:  3.60 cm MITRAL VALVE               TRICUSPID VALVE MV Area (PHT): 4.06 cm    TR Peak grad:   28.3 mmHg MV Decel Time: 187 msec    TR Vmax:        266.00 cm/s MV E velocity: 75.60 cm/s MV A velocity: 74.50 cm/s  SHUNTS MV E/A ratio:  1.01        Systemic VTI:  0.21 m                            Systemic Diam: 2.00 cm Chilton Si MD Electronically signed by Chilton Si MD Signature  Date/Time: 11/23/2023/1:41:57 PM    Final    DG Finger Little Left Result Date: 11/21/2023 CLINICAL DATA:  Assess for mallet finger EXAM: LEFT FINGER(S) - 2+ VIEW COMPARISON:  None Available. FINDINGS: No acute fracture is seen. Volar flexion at the fifth D IP joint. Mild heterogeneous mineralization possibly due to osteopenia. Mild joint space narrowing at the IP joints. IMPRESSION: No acute  osseous abnormality. Volar flexion deformity at the fifth DIP joint. Electronically Signed   By: Jasmine Pang M.D.   On: 11/21/2023 21:19   CT ABDOMEN PELVIS WO CONTRAST Result Date: 11/21/2023 CLINICAL DATA:  Abdomen pain EXAM: CT ABDOMEN AND PELVIS WITHOUT CONTRAST TECHNIQUE: Multidetector CT imaging of the abdomen and pelvis was performed following the standard protocol without IV contrast. RADIATION DOSE REDUCTION: This exam was performed according to the departmental dose-optimization program which includes automated exposure control, adjustment of the mA and/or kV according to patient size and/or use of iterative reconstruction technique. COMPARISON:  CT 10/30/2019 FINDINGS: Lower chest: Lung bases are clear.  Coronary vascular calcification Hepatobiliary: No focal liver abnormality is seen. No gallstones, gallbladder wall thickening, or biliary dilatation. Pancreas: Unremarkable. No pancreatic ductal dilatation or surrounding inflammatory changes. Spleen: Normal in size without focal abnormality. Adrenals/Urinary Tract: Adrenal glands are normal. Kidneys show no hydronephrosis. The bladder is unremarkable Stomach/Bowel: Stomach nonenlarged. No dilated small bowel. Large stool burden suggesting constipation. No acute bowel wall thickening Vascular/Lymphatic: Aortic atherosclerosis. No enlarged abdominal or pelvic lymph nodes. Reproductive: Uterus and bilateral adnexa are unremarkable. Other: Negative for pelvic effusion or free air Musculoskeletal: Scoliosis and degenerative changes of the spine. Posterior fusion  hardware L4 through S1. Post augmentation changes at L3. IMPRESSION: 1. No CT evidence for acute intra-abdominal or pelvic abnormality. 2. Large stool burden suggesting constipation. 3. Aortic atherosclerosis. Aortic Atherosclerosis (ICD10-I70.0). Electronically Signed   By: Jasmine Pang M.D.   On: 11/21/2023 21:16   CT HEAD WO CONTRAST Result Date: 11/21/2023 CLINICAL DATA:  Mental status change, persistent or worsening. EXAM: CT HEAD WITHOUT CONTRAST TECHNIQUE: Contiguous axial images were obtained from the base of the skull through the vertex without intravenous contrast. RADIATION DOSE REDUCTION: This exam was performed according to the departmental dose-optimization program which includes automated exposure control, adjustment of the mA and/or kV according to patient size and/or use of iterative reconstruction technique. COMPARISON:  06/02/2022 FINDINGS: Brain: No abnormality seen affecting the brainstem or cerebellum. Within the cerebral hemispheres, there is patchy low density in the frontal white matter and external capsule regions consistent with chronic small vessel ischemic change. No cortical or large vessel territory infarction. No mass, hemorrhage, hydrocephalus or extra-axial collection. Vascular: There is atherosclerotic calcification of the major vessels at the base of the brain. Skull: Negative Sinuses/Orbits: Clear/normal Other: None IMPRESSION: No acute CT finding. Chronic small vessel ischemic changes of the white matter. Electronically Signed   By: Paulina Fusi M.D.   On: 11/21/2023 15:54   DG Chest Port 1 View Result Date: 11/21/2023 CLINICAL DATA:  Altered mental status EXAM: PORTABLE CHEST 1 VIEW COMPARISON:  08/08/2023.  Chest CT dated 06/29/2023. FINDINGS: Normal sized heart. Tortuous and partially calcified thoracic aorta. Grossly stable left diaphragmatic eventration with interval ill-defined margins due to minimal adjacent airspace opacity. Small right diaphragmatic eventration.  Interval mild right basilar linear atelectasis. Stable moderate dextroconvex thoracolumbar scoliosis and midthoracic kyphoplasty material. Diffuse osteopenia. IMPRESSION: 1. Interval mild right basilar linear atelectasis. 2. Interval minimal left basilar atelectasis or pneumonia. Electronically Signed   By: Beckie Salts M.D.   On: 11/21/2023 14:09   Microbiology: Results for orders placed or performed during the hospital encounter of 11/21/23  Resp panel by RT-PCR (RSV, Flu A&B, Covid) Anterior Nasal Swab     Status: None   Collection Time: 11/21/23 11:42 AM   Specimen: Anterior Nasal Swab  Result Value Ref Range Status   SARS Coronavirus 2 by RT PCR  NEGATIVE NEGATIVE Final   Influenza A by PCR NEGATIVE NEGATIVE Final   Influenza B by PCR NEGATIVE NEGATIVE Final    Comment: (NOTE) The Xpert Xpress SARS-CoV-2/FLU/RSV plus assay is intended as an aid in the diagnosis of influenza from Nasopharyngeal swab specimens and should not be used as a sole basis for treatment. Nasal washings and aspirates are unacceptable for Xpert Xpress SARS-CoV-2/FLU/RSV testing.  Fact Sheet for Patients: BloggerCourse.com  Fact Sheet for Healthcare Providers: SeriousBroker.it  This test is not yet approved or cleared by the [place] FDA and has been authorized for detection and/or diagnosis of SARS-CoV-2 by FDA under an Emergency Use Authorization (EUA). This EUA will remain in effect (meaning this test can be used) for the duration of the COVID-19 declaration under Section 564(b)(1) of the Act, 21 U.S.C. section 360bbb-3(b)(1), unless the authorization is terminated or revoked.     Resp Syncytial Virus by PCR NEGATIVE NEGATIVE Final    Comment: (NOTE) Fact Sheet for Patients: BloggerCourse.com  Fact Sheet for Healthcare Providers: SeriousBroker.it  This test is not yet approved or cleared by the  [place] FDA and has been authorized for detection and/or diagnosis of SARS-CoV-2 by FDA under an Emergency Use Authorization (EUA). This EUA will remain in effect (meaning this test can be used) for the duration of the COVID-19 declaration under Section 564(b)(1) of the Act, 21 U.S.C. section 360bbb-3(b)(1), unless the authorization is terminated or revoked.  Performed at [facility], [address], [place], [place] [address]   MRSA Next Gen by PCR, Nasal     Status: None   Collection Time: 11/24/23  2:51 AM   Specimen: Nasal Mucosa; Nasal Swab  Result Value Ref Range Status   MRSA by PCR Next Gen NOT DETECTED NOT DETECTED Final    Comment: (NOTE) The GeneXpert MRSA Assay (FDA approved for NASAL specimens only), is one component of a comprehensive MRSA colonization surveillance program. It is not intended to diagnose MRSA infection nor to guide or monitor treatment for MRSA infections. Test performance is not FDA approved in patients less than 63 years old. Performed at [facility], [address], [place], [place] [address]    Labs: CBC: Recent Labs  Lab 11/27/23 0229 11/28/23 0229 11/29/23 0833 11/30/23 0239 12/01/23 0229  WBC 8.4 8.4 9.5 9.3 9.6  NEUTROABS  --   --  7.1 6.4 6.7  HGB 10.2* 10.3* 10.1* 9.6* 10.5*  HCT 28.1* 28.7* 28.1* 27.2* 30.0*  MCV 90.9 91.4 90.9 94.1 93.2  PLT 302 344 419* 385 520*   Basic Metabolic Panel: Recent Labs  Lab 11/27/23 1144 11/28/23 0229 11/28/23 1241 11/29/23 0833 11/29/23 1139 11/30/23 0239 11/30/23 1153 11/30/23 1755 12/01/23 0229 12/01/23 1031  NA 120* 121*   < > 122*   < > 125* 132* 135 136 137  K 3.8 3.8  --  3.6  --  3.8  --   --  3.8  --   CL 86* 90*  --  89*  --  93*  --   --  95*  --   CO2 22 21*  --  21*  --  22  --   --  25  --   GLUCOSE 138* 107*  --  205*  --  109*  --   --  127*  --   BUN 6* 6*  --  21  --  74*  --   --  43*  --   CREATININE  1.01* 0.71  --  0.84  --  0.82  --   --   0.91  --   CALCIUM 8.8* 8.5*  --  9.0  --  8.8*  --   --  9.9  --   MG  --   --   --  1.5*  --  2.4  --   --  2.1  --   PHOS  --   --   --  3.1  --  3.9  --   --  3.7  --    < > = values in this interval not displayed.   Liver Function Tests: Recent Labs  Lab 11/29/23 0833 11/30/23 0239 12/01/23 0229  AST 26 21 22   ALT 27 21 28   ALKPHOS 61 59 64  BILITOT 0.6 0.3 0.4  PROT 5.6* 5.1* 6.1*  ALBUMIN 3.1* 2.9* 3.4*   CBG: No results for input(s): "GLUCAP" in the last 168 hours.  Discharge time spent: greater than 30 minutes.  Signed: Marguerita Merles, DO [facility] 12/02/2023

## 2023-12-03 ENCOUNTER — Telehealth: Payer: Self-pay

## 2023-12-03 ENCOUNTER — Telehealth: Payer: Self-pay | Admitting: Internal Medicine

## 2023-12-03 NOTE — Transitions of Care (Post Inpatient/ED Visit) (Signed)
   12/03/2023  Name: Ellagrace Yoshida [facility] MRN: [id number] DOB: March 26, 1946  Today's TOC FU Call Status: Today's TOC FU Call Status:: Unsuccessful Call (1st Attempt) Unsuccessful Call (1st Attempt) Date: 12/03/23  Attempted to reach the patient regarding the most recent Inpatient/ED visit.  Follow Up Plan: Additional outreach attempts will be made to reach the patient to complete the Transitions of Care (Post Inpatient/ED visit) call.   Deidre Ala, BSN, RN [place]  VBCI - [employer] [facility] RN Care Manager [phone]

## 2023-12-04 ENCOUNTER — Telehealth: Payer: Self-pay | Admitting: *Deleted

## 2023-12-04 NOTE — Transitions of Care (Post Inpatient/ED Visit) (Signed)
   12/04/2023  Name: Morgan [name] [facility] MRN: [id number] DOB: 02/13/1946  Today's TOC FU Call Status: Today's TOC FU Call Status:: Unsuccessful Call (2nd Attempt) Unsuccessful Call (2nd Attempt) Date: 12/04/23  Attempted to reach the patient regarding the most recent Inpatient visit; received automated outgoing voice message stating that "the person you are trying to call has a voice mail box that is full; please hang up and try your call again later;" unable to leave voice message requesting call back   Follow Up Plan: Additional outreach attempts will be made to reach the patient to complete the Transitions of Care (Post Inpatient visit) call.   Pls call/ message for questions,  Caryl Pina, RN, BSN, CCRN Alumnus RN Care Manager  Transitions of Care  VBCI - [facility] [phone]: direct office

## 2023-12-05 ENCOUNTER — Telehealth: Payer: Self-pay

## 2023-12-05 ENCOUNTER — Ambulatory Visit (INDEPENDENT_AMBULATORY_CARE_PROVIDER_SITE_OTHER): Payer: PPO | Admitting: Primary Care

## 2023-12-05 ENCOUNTER — Encounter: Payer: Self-pay | Admitting: Primary Care

## 2023-12-05 VITALS — BP 148/80 | HR 76 | Temp 97.6°F | Ht 61.0 in | Wt 124.0 lb

## 2023-12-05 DIAGNOSIS — F32A Depression, unspecified: Secondary | ICD-10-CM | POA: Diagnosis not present

## 2023-12-05 DIAGNOSIS — I10 Essential (primary) hypertension: Secondary | ICD-10-CM | POA: Diagnosis not present

## 2023-12-05 DIAGNOSIS — I4891 Unspecified atrial fibrillation: Secondary | ICD-10-CM

## 2023-12-05 DIAGNOSIS — F419 Anxiety disorder, unspecified: Secondary | ICD-10-CM

## 2023-12-05 DIAGNOSIS — E871 Hypo-osmolality and hyponatremia: Secondary | ICD-10-CM | POA: Diagnosis not present

## 2023-12-05 DIAGNOSIS — N179 Acute kidney failure, unspecified: Secondary | ICD-10-CM | POA: Diagnosis not present

## 2023-12-05 LAB — COMPREHENSIVE METABOLIC PANEL
ALT: 22 U/L (ref 0–35)
AST: 22 U/L (ref 0–37)
Albumin: 4.1 g/dL (ref 3.5–5.2)
Alkaline Phosphatase: 89 U/L (ref 39–117)
BUN: 10 mg/dL (ref 6–23)
CO2: 24 meq/L (ref 19–32)
Calcium: 8.9 mg/dL (ref 8.4–10.5)
Chloride: 91 meq/L — ABNORMAL LOW (ref 96–112)
Creatinine, Ser: 0.91 mg/dL (ref 0.40–1.20)
GFR: 61.42 mL/min (ref >60.00)
Glucose, Bld: 114 mg/dL — ABNORMAL HIGH (ref 70–99)
Potassium: 4.7 meq/L (ref 3.5–5.1)
Sodium: 124 meq/L — ABNORMAL LOW (ref 135–145)
Total Bilirubin: 0.6 mg/dL (ref 0.2–1.2)
Total Protein: 6.9 g/dL (ref 6.0–8.3)

## 2023-12-05 LAB — CBC
HCT: 32.7 % — ABNORMAL LOW (ref 36.0–46.0)
Hemoglobin: 11.2 g/dL — ABNORMAL LOW (ref 12.0–15.0)
MCHC: 34.1 g/dL (ref 30.0–36.0)
MCV: 97.7 fl (ref 78.0–100.0)
Platelets: 732 10*3/uL — ABNORMAL HIGH (ref 150.0–400.0)
RBC: 3.35 Mil/uL — ABNORMAL LOW (ref 3.87–5.11)
RDW: 13.3 % (ref 11.5–15.5)
WBC: 9.8 10*3/uL (ref 4.0–10.5)

## 2023-12-05 LAB — PHOSPHORUS: Phosphorus: 3.4 mg/dL (ref 2.3–4.6)

## 2023-12-05 LAB — MAGNESIUM: Magnesium: 1.7 mg/dL (ref 1.5–2.5)

## 2023-12-05 MED ORDER — SERTRALINE HCL 25 MG PO TABS: 25.0000 mg | ORAL_TABLET | Freq: Every day | ORAL | 0 refills | Status: DC
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
## 2023-12-05 NOTE — Assessment & Plan Note (Signed)
 Deteriorated since off of Effexor.  Discussed options for treatment. Would initiate Lexapro 5 mg daily, however this will interact with amiodarone. Start Zoloft 25 mg daily.  She will update.

## 2023-12-05 NOTE — Assessment & Plan Note (Signed)
 Slightly elevated today. Will have her start monitoring home readings.  Continue hydralazine 25 mg BID, irbesartan 300 mg daily, metoprolol succinate 25 daily. Follow up with cardiology as scheduled.

## 2023-12-05 NOTE — Transitions of Care (Post Inpatient/ED Visit) (Signed)
   12/05/2023  Name: Morgan [name] [facility] MRN: [id number] DOB: [birth year]  Today's TOC FU Call Status: Today's TOC FU Call Status:: Unsuccessful Call (3rd Attempt) Unsuccessful Call (3rd Attempt) Date: 12/05/23  Attempted to reach the patient regarding the most recent Inpatient/ED visit. Voice mail box is full, TOC RNCM unable to leave a confidential voicemail or leave a call back number.   NOTE: Patient did see PCP for post discharge follow up today at 0840 per notes available for review in EPIC.   Follow Up Plan: No further outreach attempts will be made at this time. We have been unable to contact the patient.   Gabriel Cirri MSN, RN RN Case Sales executive Health  VBCI-Population Health Office Hours Wed/Thur  8:00 am-6:00 pm Direct Dial: [phone] Main Phone [phone]  Fax: [phone] [email or web address]

## 2023-12-05 NOTE — Assessment & Plan Note (Signed)
 With recent hospitalization. Hospital notes, labs, imaging reviewed.   Repeat labs pending. Remain off venlafaxine.  Follow-up with nephrology as scheduled.

## 2023-12-05 NOTE — Assessment & Plan Note (Signed)
 During recent hospitalization. Hospital labs, imaging, notes reviewed.  Repeat renal function pending. Follow up with nephrology as scheduled.

## 2023-12-05 NOTE — Patient Instructions (Signed)
 Stop by the lab prior to leaving today. I will notify you of your results once received.   Start sertraline (Zoloft) 25 mg for anxiety and depression.   Follow up with cardiology and nephrology.  It was a pleasure to see you today!

## 2023-12-05 NOTE — Assessment & Plan Note (Signed)
 With recent hospitalization. Hospital notes, labs, imaging reviewed.  Continue apixaban 5 mg twice daily, metoprolol succinate 25 mg daily, amiodarone 200 mg twice daily. Follow-up with cardiology as scheduled.

## 2023-12-06 ENCOUNTER — Telehealth: Payer: Self-pay

## 2023-12-06 ENCOUNTER — Other Ambulatory Visit: Payer: PPO

## 2023-12-06 DIAGNOSIS — F419 Anxiety disorder, unspecified: Secondary | ICD-10-CM

## 2023-12-06 NOTE — Telephone Encounter (Signed)
 Called and spoke with patient she states since her appt on Monday she has had bilateral lower leg and ankle swelling. She has been elevated her legs and hasn't noticed much improvement. Not painful to walk, and legs are not warm to the touch. She hasn't tried compression socks. Patient says  she will get compression socks and continue elevation when possible. Would like to know if Morgan [name] has any further suggestions.

## 2023-12-06 NOTE — Telephone Encounter (Signed)
 Please notify patient that the hydralazine could cause swelling.  This is one of the new medications that was initiated in the hospital.  When is her appointment with the kidney doctor?  Also, the Zoloft medication we prescribed yesterday for her anxiety could cause low sodium.  I still question if the venlafaxine was the actual cause of her low sodium.  Regardless, tell her not to take the Zoloft.  We can try a medication for anxiety goal buspirone.  This medication is taken twice daily for anxiety.  Let me know if she is interested.

## 2023-12-07 MED ORDER — BUSPIRONE HCL 5 MG PO TABS: 5.0000 mg | ORAL_TABLET | Freq: Two times a day (BID) | ORAL | 0 refills | Status: DC

## 2023-12-07 NOTE — Telephone Encounter (Signed)
 Noted.  Prescription for buspirone 5 mg sent to pharmacy to take twice daily for anxiety.  Follow-up with nephrologist as scheduled.

## 2023-12-07 NOTE — Telephone Encounter (Signed)
 Called patient and reviewed all information. Patient verbalized understanding.  Patients kidney doctor appt is on Mar 13 she stated she was planning on calling today to see if she can get it moved up any. She has not started Zoloft yet, and she is agreeable to switching to buspirone. Send to [facility]. Will call if any further questions.

## 2023-12-07 NOTE — Addendum Note (Signed)
 Addended by: Doreene Nest on: 12/07/2023 01:20 PM   Modules accepted: Orders

## 2023-12-11 ENCOUNTER — Ambulatory Visit: Payer: Self-pay | Admitting: Primary Care

## 2023-12-11 ENCOUNTER — Inpatient Hospital Stay
Admission: EM | Admit: 2023-12-11 | Discharge: 2023-12-14 | DRG: 644 | Disposition: A | Discharge status: Home Health | Attending: Internal Medicine | Admitting: Internal Medicine

## 2023-12-11 ENCOUNTER — Encounter: Payer: Self-pay | Admitting: Internal Medicine

## 2023-12-11 ENCOUNTER — Ambulatory Visit (INDEPENDENT_AMBULATORY_CARE_PROVIDER_SITE_OTHER): Admitting: Internal Medicine

## 2023-12-11 ENCOUNTER — Emergency Department

## 2023-12-11 ENCOUNTER — Other Ambulatory Visit: Payer: Self-pay

## 2023-12-11 ENCOUNTER — Telehealth: Payer: Self-pay | Admitting: Internal Medicine

## 2023-12-11 VITALS — BP 110/60 | HR 88 | Temp 98.1°F | Ht 61.0 in | Wt 127.0 lb

## 2023-12-11 DIAGNOSIS — I10 Essential (primary) hypertension: Secondary | ICD-10-CM | POA: Diagnosis not present

## 2023-12-11 DIAGNOSIS — R0602 Shortness of breath: Secondary | ICD-10-CM

## 2023-12-11 DIAGNOSIS — J439 Emphysema, unspecified: Secondary | ICD-10-CM | POA: Diagnosis not present

## 2023-12-11 DIAGNOSIS — E222 Syndrome of inappropriate secretion of antidiuretic hormone: Secondary | ICD-10-CM | POA: Diagnosis not present

## 2023-12-11 DIAGNOSIS — M419 Scoliosis, unspecified: Secondary | ICD-10-CM | POA: Diagnosis present

## 2023-12-11 DIAGNOSIS — I4891 Unspecified atrial fibrillation: Secondary | ICD-10-CM | POA: Diagnosis present

## 2023-12-11 DIAGNOSIS — Z974 Presence of external hearing-aid: Secondary | ICD-10-CM

## 2023-12-11 DIAGNOSIS — F419 Anxiety disorder, unspecified: Secondary | ICD-10-CM | POA: Diagnosis not present

## 2023-12-11 DIAGNOSIS — I959 Hypotension, unspecified: Secondary | ICD-10-CM | POA: Diagnosis not present

## 2023-12-11 DIAGNOSIS — R918 Other nonspecific abnormal finding of lung field: Secondary | ICD-10-CM | POA: Diagnosis not present

## 2023-12-11 DIAGNOSIS — Z885 Allergy status to narcotic agent status: Secondary | ICD-10-CM

## 2023-12-11 DIAGNOSIS — Z79899 Other long term (current) drug therapy: Secondary | ICD-10-CM | POA: Diagnosis not present

## 2023-12-11 DIAGNOSIS — Z1152 Encounter for screening for COVID-19: Secondary | ICD-10-CM | POA: Diagnosis not present

## 2023-12-11 DIAGNOSIS — E785 Hyperlipidemia, unspecified: Secondary | ICD-10-CM

## 2023-12-11 DIAGNOSIS — S2241XD Multiple fractures of ribs, right side, subsequent encounter for fracture with routine healing: Secondary | ICD-10-CM | POA: Diagnosis not present

## 2023-12-11 DIAGNOSIS — Z888 Allergy status to other drugs, medicaments and biological substances status: Secondary | ICD-10-CM | POA: Diagnosis not present

## 2023-12-11 DIAGNOSIS — Z7901 Long term (current) use of anticoagulants: Secondary | ICD-10-CM | POA: Diagnosis not present

## 2023-12-11 DIAGNOSIS — J449 Chronic obstructive pulmonary disease, unspecified: Secondary | ICD-10-CM | POA: Diagnosis not present

## 2023-12-11 DIAGNOSIS — R600 Localized edema: Secondary | ICD-10-CM

## 2023-12-11 DIAGNOSIS — K219 Gastro-esophageal reflux disease without esophagitis: Secondary | ICD-10-CM | POA: Diagnosis not present

## 2023-12-11 DIAGNOSIS — E871 Hypo-osmolality and hyponatremia: Secondary | ICD-10-CM | POA: Diagnosis not present

## 2023-12-11 DIAGNOSIS — Z8249 Family history of ischemic heart disease and other diseases of the circulatory system: Secondary | ICD-10-CM | POA: Diagnosis not present

## 2023-12-11 DIAGNOSIS — R079 Chest pain, unspecified: Secondary | ICD-10-CM

## 2023-12-11 DIAGNOSIS — R0789 Other chest pain: Secondary | ICD-10-CM | POA: Diagnosis not present

## 2023-12-11 DIAGNOSIS — F17210 Nicotine dependence, cigarettes, uncomplicated: Secondary | ICD-10-CM | POA: Diagnosis present

## 2023-12-11 DIAGNOSIS — Z604 Social exclusion and rejection: Secondary | ICD-10-CM | POA: Diagnosis not present

## 2023-12-11 DIAGNOSIS — M7989 Other specified soft tissue disorders: Secondary | ICD-10-CM | POA: Diagnosis present

## 2023-12-11 DIAGNOSIS — G8929 Other chronic pain: Secondary | ICD-10-CM | POA: Diagnosis present

## 2023-12-11 DIAGNOSIS — M810 Age-related osteoporosis without current pathological fracture: Secondary | ICD-10-CM | POA: Diagnosis not present

## 2023-12-11 DIAGNOSIS — M7121 Synovial cyst of popliteal space [Baker], right knee: Secondary | ICD-10-CM | POA: Diagnosis not present

## 2023-12-11 DIAGNOSIS — Z981 Arthrodesis status: Secondary | ICD-10-CM

## 2023-12-11 DIAGNOSIS — E8770 Fluid overload, unspecified: Secondary | ICD-10-CM | POA: Diagnosis not present

## 2023-12-11 DIAGNOSIS — R0689 Other abnormalities of breathing: Secondary | ICD-10-CM | POA: Diagnosis not present

## 2023-12-11 DIAGNOSIS — I700 Atherosclerosis of aorta: Secondary | ICD-10-CM | POA: Diagnosis not present

## 2023-12-11 DIAGNOSIS — R197 Diarrhea, unspecified: Secondary | ICD-10-CM | POA: Diagnosis present

## 2023-12-11 DIAGNOSIS — F17200 Nicotine dependence, unspecified, uncomplicated: Secondary | ICD-10-CM | POA: Diagnosis present

## 2023-12-11 DIAGNOSIS — S2249XA Multiple fractures of ribs, unspecified side, initial encounter for closed fracture: Secondary | ICD-10-CM | POA: Diagnosis not present

## 2023-12-11 DIAGNOSIS — R0600 Dyspnea, unspecified: Secondary | ICD-10-CM

## 2023-12-11 DIAGNOSIS — F32A Depression, unspecified: Secondary | ICD-10-CM | POA: Diagnosis not present

## 2023-12-11 DIAGNOSIS — J441 Chronic obstructive pulmonary disease with (acute) exacerbation: Secondary | ICD-10-CM | POA: Diagnosis not present

## 2023-12-11 DIAGNOSIS — M47816 Spondylosis without myelopathy or radiculopathy, lumbar region: Secondary | ICD-10-CM | POA: Diagnosis present

## 2023-12-11 LAB — CBC WITH DIFFERENTIAL/PLATELET
Abs Immature Granulocytes: 0.09 10*3/uL — ABNORMAL HIGH (ref 0.00–0.07)
Basophils Absolute: 0 10*3/uL (ref 0.0–0.1)
Basophils Relative: 0 %
Eosinophils Absolute: 0 10*3/uL (ref 0.0–0.5)
Eosinophils Relative: 0 %
HCT: 28.6 % — ABNORMAL LOW (ref 36.0–46.0)
Hemoglobin: 10 g/dL — ABNORMAL LOW (ref 12.0–15.0)
Immature Granulocytes: 1 %
Lymphocytes Relative: 12 %
Lymphs Abs: 1.3 10*3/uL (ref 0.7–4.0)
MCH: 33.6 pg (ref 26.0–34.0)
MCHC: 35 g/dL (ref 30.0–36.0)
MCV: 96 fL (ref 80.0–100.0)
Monocytes Absolute: 1 10*3/uL (ref 0.1–1.0)
Monocytes Relative: 10 %
Neutro Abs: 8.1 10*3/uL — ABNORMAL HIGH (ref 1.7–7.7)
Neutrophils Relative %: 77 %
Platelets: 512 10*3/uL — ABNORMAL HIGH (ref 150–400)
RBC: 2.98 MIL/uL — ABNORMAL LOW (ref 3.87–5.11)
RDW: 13.5 % (ref 11.5–15.5)
WBC: 10.6 10*3/uL — ABNORMAL HIGH (ref 4.0–10.5)
nRBC: 0 % (ref 0.0–0.2)

## 2023-12-11 LAB — URINALYSIS, ROUTINE W REFLEX MICROSCOPIC
Bacteria, UA: NONE SEEN
Bilirubin Urine: NEGATIVE
Glucose, UA: 50 mg/dL — AB
Ketones, ur: NEGATIVE mg/dL
Leukocytes,Ua: NEGATIVE
Nitrite: NEGATIVE
Protein, ur: NEGATIVE mg/dL
Specific Gravity, Urine: 1.023 (ref 1.005–1.030)
pH: 6 (ref 5.0–8.0)

## 2023-12-11 LAB — COMPREHENSIVE METABOLIC PANEL
ALT: 21 U/L (ref 0–44)
AST: 25 U/L (ref 15–41)
Albumin: 4 g/dL (ref 3.5–5.0)
Alkaline Phosphatase: 86 U/L (ref 38–126)
Anion gap: 11 (ref 5–15)
BUN: 10 mg/dL (ref 8–23)
CO2: 20 mmol/L — ABNORMAL LOW (ref 22–32)
Calcium: 8.8 mg/dL — ABNORMAL LOW (ref 8.9–10.3)
Chloride: 90 mmol/L — ABNORMAL LOW (ref 98–111)
Creatinine, Ser: 0.91 mg/dL (ref 0.44–1.00)
GFR, Estimated: >60 mL/min (ref >60)
Glucose, Bld: 96 mg/dL (ref 70–99)
Potassium: 3.9 mmol/L (ref 3.5–5.1)
Sodium: 121 mmol/L — ABNORMAL LOW (ref 135–145)
Total Bilirubin: 0.5 mg/dL (ref 0.0–1.2)
Total Protein: 6.7 g/dL (ref 6.5–8.1)

## 2023-12-11 LAB — BRAIN NATRIURETIC PEPTIDE: B Natriuretic Peptide: 97.5 pg/mL (ref 0.0–100.0)

## 2023-12-11 LAB — TROPONIN I (HIGH SENSITIVITY)
Troponin I (High Sensitivity): 5 ng/L (ref <18)
Troponin I (High Sensitivity): 5 ng/L (ref <18)

## 2023-12-11 MED ORDER — BUSPIRONE HCL 10 MG PO TABS
5.0000 mg | ORAL_TABLET | Freq: Two times a day (BID) | ORAL | Status: DC
  Administered 2023-12-12 – 2023-12-14 (×6): 5 mg via ORAL
  Filled 2023-12-11 (×6): qty 1

## 2023-12-11 MED ORDER — ACETAMINOPHEN 650 MG RE SUPP: 650.0000 mg | Freq: Four times a day (QID) | RECTAL | Status: DC | PRN

## 2023-12-11 MED ORDER — ONDANSETRON HCL 4 MG PO TABS
4.0000 mg | ORAL_TABLET | Freq: Four times a day (QID) | ORAL | Status: DC | PRN
Start: 2023-12-11 — End: 2023-12-14

## 2023-12-11 MED ORDER — ONDANSETRON HCL 4 MG/2ML IJ SOLN
4.0000 mg | Freq: Four times a day (QID) | INTRAMUSCULAR | Status: DC | PRN
Start: 2023-12-11 — End: 2023-12-14

## 2023-12-11 MED ORDER — AMIODARONE HCL 200 MG PO TABS
200.0000 mg | ORAL_TABLET | Freq: Every day | ORAL | Status: DC
  Administered 2023-12-12 – 2023-12-14 (×3): 200 mg via ORAL
  Filled 2023-12-11 (×3): qty 1

## 2023-12-11 MED ORDER — MORPHINE SULFATE (PF) 2 MG/ML IV SOLN: 2.0000 mg | INTRAVENOUS | Status: DC | PRN

## 2023-12-11 MED ORDER — ATORVASTATIN CALCIUM 20 MG PO TABS
40.0000 mg | ORAL_TABLET | Freq: Every day | ORAL | Status: DC
  Administered 2023-12-12 – 2023-12-14 (×3): 40 mg via ORAL
  Filled 2023-12-11 (×3): qty 2

## 2023-12-11 MED ORDER — IPRATROPIUM-ALBUTEROL 0.5-2.5 (3) MG/3ML IN SOLN
6.0000 mL | Freq: Once | RESPIRATORY_TRACT | Status: AC
  Administered 2023-12-11: 6 mL via RESPIRATORY_TRACT
  Filled 2023-12-11: qty 6

## 2023-12-11 MED ORDER — UMECLIDINIUM BROMIDE 62.5 MCG/ACT IN AEPB
1.0000 | INHALATION_SPRAY | Freq: Every day | RESPIRATORY_TRACT | Status: DC
  Administered 2023-12-12 – 2023-12-14 (×3): 1 via RESPIRATORY_TRACT
  Filled 2023-12-11 (×2): qty 7

## 2023-12-11 MED ORDER — HYDROCODONE-ACETAMINOPHEN 5-325 MG PO TABS
1.0000 | ORAL_TABLET | ORAL | Status: DC | PRN
  Administered 2023-12-13: 1 via ORAL
  Administered 2023-12-14: 2 via ORAL
  Filled 2023-12-11: qty 1
  Filled 2023-12-11: qty 2

## 2023-12-11 MED ORDER — HYDRALAZINE HCL 50 MG PO TABS
25.0000 mg | ORAL_TABLET | Freq: Two times a day (BID) | ORAL | Status: DC
  Administered 2023-12-12 – 2023-12-14 (×3): 25 mg via ORAL
  Filled 2023-12-11 (×4): qty 1

## 2023-12-11 MED ORDER — FLUTICASONE FUROATE-VILANTEROL 100-25 MCG/ACT IN AEPB
1.0000 | INHALATION_SPRAY | Freq: Every day | RESPIRATORY_TRACT | Status: DC
  Administered 2023-12-12 – 2023-12-14 (×3): 1 via RESPIRATORY_TRACT
  Filled 2023-12-11 (×2): qty 28

## 2023-12-11 MED ORDER — APIXABAN 5 MG PO TABS
5.0000 mg | ORAL_TABLET | Freq: Two times a day (BID) | ORAL | Status: DC
  Administered 2023-12-12 – 2023-12-14 (×6): 5 mg via ORAL
  Filled 2023-12-11 (×6): qty 1

## 2023-12-11 MED ORDER — ALBUTEROL SULFATE (2.5 MG/3ML) 0.083% IN NEBU: 2.5000 mg | INHALATION_SOLUTION | RESPIRATORY_TRACT | Status: DC | PRN

## 2023-12-11 MED ORDER — ACETAMINOPHEN 325 MG PO TABS
650.0000 mg | ORAL_TABLET | Freq: Four times a day (QID) | ORAL | Status: DC | PRN
  Administered 2023-12-12 – 2023-12-13 (×6): 650 mg via ORAL
  Filled 2023-12-11 (×6): qty 2

## 2023-12-11 MED ORDER — TOLVAPTAN 15 MG PO TABS
15.0000 mg | ORAL_TABLET | Freq: Once | ORAL | Status: DC
  Filled 2023-12-11: qty 1

## 2023-12-11 MED ORDER — IOHEXOL 350 MG/ML SOLN
75.0000 mL | Freq: Once | INTRAVENOUS | Status: AC | PRN
  Administered 2023-12-11: 75 mL via INTRAVENOUS

## 2023-12-11 MED ORDER — IRBESARTAN 150 MG PO TABS
300.0000 mg | ORAL_TABLET | Freq: Every day | ORAL | Status: DC
Start: 2023-12-12 — End: 2023-12-14
  Administered 2023-12-13 – 2023-12-14 (×2): 300 mg via ORAL
  Filled 2023-12-11 (×4): qty 2

## 2023-12-11 MED ORDER — METOPROLOL SUCCINATE ER 25 MG PO TB24
25.0000 mg | ORAL_TABLET | Freq: Every day | ORAL | Status: DC
Start: 2023-12-12 — End: 2023-12-14
  Administered 2023-12-12 – 2023-12-14 (×3): 25 mg via ORAL
  Filled 2023-12-11 (×3): qty 1

## 2023-12-11 NOTE — Assessment & Plan Note (Signed)
 Patient was wheezing on arrival to the ED, resolved following 2 DuoNebs Continue home inhalers with DuoNebs as needed
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
## 2023-12-11 NOTE — Assessment & Plan Note (Signed)
 Recent onset in February 2025 requiring DCCV and amiodarone infusion Continue amiodarone and metoprolol Continue Eliquis

## 2023-12-11 NOTE — Assessment & Plan Note (Signed)
 Continue metoprolol, hydralazine and irbesartan

## 2023-12-11 NOTE — ED Triage Notes (Signed)
 Pt arrives via POV with CC of leg swelling and difficulty breathing. Pt has recent dx of heart failure. Pt reports increased leg swelling and extreme difficulty breathing when ambulating. Pt also reports trouble controlling bowel movements.

## 2023-12-11 NOTE — Telephone Encounter (Signed)
 Copied from CRM [id number]. Topic: Clinical - Medical Advice >> Dec 11, 2023  5:59 PM Eula Fried wrote: Reason for CRM: Patient is on her way with her son (POA) to the Emergency Room at [facility] and he called to ask if we can call to let them know she was on her way because she just came from the ER and had to wait 35 hours and patient is believed to have heart failure, son is concerned
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
## 2023-12-11 NOTE — Patient Instructions (Addendum)
 Advised taking patient to ED at [facility] for further evaluation of multiple issues that are concerning including hyponatremia. Son says he will do this today.

## 2023-12-11 NOTE — Telephone Encounter (Signed)
 Noted.

## 2023-12-11 NOTE — Assessment & Plan Note (Signed)
 Possible venous stasis.  No history of CHF No prior history of CHF, BNP 97, chest x-ray without pulmonary vascular congestion Patient had echocardiogram on 11/21/2023 that showed EF 60 to 65% and indeterminate diastolic parameters Follow-up venous ultrasound to evaluate for DVT---> negative for DVT Daily weights with intake and output monitoring Keep legs elevated

## 2023-12-11 NOTE — Telephone Encounter (Signed)
 This RN called [facility] to give report to Amber in the ED per provider's OV notes from visit today. Notified pt's son, ED is aware they are enroute. Pt's son verbalized understanding and agrees to plan.

## 2023-12-11 NOTE — ED Provider Notes (Signed)
 [facility]ud[facility] Provider Note    Event Date/Time   First MD Initiated Contact with Patient 12/11/23 2121     (approximate)   History   Leg Swelling   HPI  Lativia Velie [facility] is a 78 y.o. female with history of COPD, anxiety, hypertension, hyperlipidemia, presenting with leg swelling since Thursday.  Also noted some chest pain, shortness of breath, cough.  Some nausea but no vomiting, no diarrhea.  She denies any dysuria.  Son states that she is smoking.  Had been admitted recently for similar low sodiums.  Went to her primary care doctor today and was sent here for concerns of volume overload and hyponatremia.  On independent chart review, she was seen by primary care doctor today at 2:00, was there for shortness of breath, cough, feet swelling.  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     Physical Exam   Triage Vital Signs: ED Triage Vitals  Encounter Vitals Group     BP 12/11/23 1920 130/65     Systolic BP Percentile --      Diastolic BP Percentile --      Pulse Rate 12/11/23 1920 80     Resp 12/11/23 1920 (!) 21     Temp 12/11/23 1920 98.2 F (36.8 C)     Temp Source 12/11/23 1920 Oral     SpO2 12/11/23 1920 96 %     Weight 12/11/23 1918 126 lb 15.8 oz (57.6 kg)     Height 12/11/23 1918 5\' 1"  (1.549 m)     Head Circumference --      Peak Flow --      Pain Score 12/11/23 1918 4     Pain Loc --      Pain Education --      Exclude from Growth Chart --     Most recent vital signs: Vitals:   12/11/23 2200 12/11/23 2300  BP: (!) 114/103 (!) 109/59  Pulse: 83 82  Resp: (!) 21 15  Temp:    SpO2: 97% 98%     General: Awake, no distress.  CV:  Good peripheral perfusion.  Resp:  Normal effort.  No increased work of breathing but she is wheezing more on the right. Abd:  No distention.  Soft nontender Other:  Bilateral lower extremity edema worse on the left.   ED Results /  Procedures / Treatments   Labs (all labs ordered are listed, but only abnormal results are displayed) Labs Reviewed  CBC WITH DIFFERENTIAL/PLATELET - Abnormal; Notable for the following components:      Result Value   WBC 10.6 (*)    RBC 2.98 (*)    Hemoglobin 10.0 (*)    HCT 28.6 (*)    Platelets 512 (*)    Neutro Abs 8.1 (*)    Abs Immature Granulocytes 0.09 (*)    All other components within normal limits  COMPREHENSIVE METABOLIC PANEL - Abnormal; Notable for the following components:   Sodium 121 (*)    Chloride 90 (*)    CO2 20 (*)    Calcium 8.8 (*)    All other components within normal limits  URINALYSIS, ROUTINE W REFLEX MICROSCOPIC - Abnormal; Notable for the following components:   Color, Urine YELLOW (*)    APPearance CLEAR (*)    Glucose, UA 50 (*)    Hgb urine dipstick SMALL (*)    All other components within normal  limits  BRAIN NATRIURETIC PEPTIDE  NA AND K (SODIUM & POTASSIUM), RAND UR  UREA NITROGEN, URINE  TROPONIN I (HIGH SENSITIVITY)  TROPONIN I (HIGH SENSITIVITY)     EKG  Sinus rhythm, rate 79, normal QS, normal QTc, T wave flattening in 3, aVL, no ischemic ST elevation, this is change compared to prior since prior EKG showed atrial fibrillation   RADIOLOGY Chest x-ray without obvious consolidation on my interpretation.   PROCEDURES:  Critical Care performed: Yes, see critical care procedure note(s)  .Critical Care  Performed by: Claybon Jabs, MD Authorized by: Claybon Jabs, MD   Critical care provider statement:    Critical care time (minutes):  35   Critical care was necessary to treat or prevent imminent or life-threatening deterioration of the following conditions:  Metabolic crisis   Critical care was time spent personally by me on the following activities:  Development of treatment plan with patient or surrogate, discussions with consultants, evaluation of patient's response to treatment, examination of patient, ordering and review of  laboratory studies, ordering and review of radiographic studies, ordering and performing treatments and interventions, pulse oximetry, re-evaluation of patient's condition and review of old charts    MEDICATIONS ORDERED IN ED: Medications  iohexol (OMNIPAQUE) 350 MG/ML injection 75 mL (75 mLs Intravenous Contrast Given 12/11/23 2140)  ipratropium-albuterol (DUONEB) 0.5-2.5 (3) MG/3ML nebulizer solution 6 mL (6 mLs Nebulization Given 12/11/23 2215)     IMPRESSION / MDM / ASSESSMENT AND PLAN / ED COURSE  I reviewed the triage vital signs and the nursing notes.                              Differential diagnosis includes, but is not limited to, electrolyte derangements, CHF, ACS, COPD, pneumonia, viral illness, DVT, PE.  Will get labs, EKG, troponin, chest x-ray, DVT study, CT PE study.  Will give her some DuoNebs.  She will need to be admitted for further management.  Patient's presentation is most consistent with acute presentation with potential threat to life or bodily function.  Independent review of labs imaging are below.  Given her hyponatremia, she will need to be admitted for further management.  Consult to hospitalist who is agreeable plan for admission will evaluate the patient.  She is admitted.  Clinical Course as of 12/11/23 2318  Tue Dec 11, 2023  2231 Independent review of labs, mild leukocytosis, her sodium is 121, creatinine is normal, LFTs are not elevated.  Troponin is not elevated, BNP is normal.  Added urine lites. [TT]  2231 DG Chest Portable 1 View Retrocardiac atelectasis or pneumonia.  Unchanged from 11/21/2023.  [TT]  2251 On reassessment after DuoNeb's, patient states that she feels a lot better, no longer short of breath.  On exam, she is no longer wheezing. [TT]    Clinical Course User Index [TT] Jodie Echevaria Franchot Erichsen, MD     FINAL CLINICAL IMPRESSION(S) / ED DIAGNOSES   Final diagnoses:  Leg edema  Right leg swelling  Hyponatremia  Chest pain, unspecified type   Shortness of breath     Rx / DC Orders   ED Discharge Orders     None        Note:  This document was prepared using Dragon voice recognition software and may include unintentional dictation errors.    Claybon Jabs, MD 12/11/23 [phone]

## 2023-12-11 NOTE — Assessment & Plan Note (Signed)
 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

## 2023-12-11 NOTE — Assessment & Plan Note (Addendum)
 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 Wynelle Link who agreed with giving a dose of tolvaptan 15 mg x 1 to treat hyponatremia Tolvaptan 15 mg x 1 ordered

## 2023-12-11 NOTE — Telephone Encounter (Signed)
 Noted, patient has checked into the ED at [facility].

## 2023-12-11 NOTE — Telephone Encounter (Signed)
 Called to check and see why patient was not at the emergency room yet. Spoke with son Thayer Ohm, he said they had gone by her house first so she could check on her dog. Then they are going to go to [facility] Emergency Room.

## 2023-12-11 NOTE — Telephone Encounter (Signed)
 Chief Complaint: feet/leg swelling Symptoms: swelling to the feet (mostly the top), ankles, up the calves Frequency: 3 days Pertinent Negatives: Patient denies redness, skin color changes, "knot" under the skin, numbness/tingling, CP, SOB Disposition: [] ED /[] Urgent Care (no appt availability in office) / [x] Appointment(In office/virtual)/ []  [facility]/ [] Home Care/ [] Refused Recommended Disposition /[] [facility]/ []  Follow-up with PCP Additional Notes: Pt's son calls in on behalf of pt. Son states pt has had swelling to her bilateral feet, ankles, and up to her calves for 3 days. Never had swelling before. Pt was recently hospitalized and discharged on 2/22. Did not have this swelling in the hospital. Pt endorses 2/10 pain with swelling. Pt denies redness (other than the marks created by her scratching), skin color change, numbness/tingling, a knot under the skin, drainage or signs of infection. Son states there may be a "little" warmth to the skin. Son states her socks have created an indentation, swelling appears pitting. No fever. No CP or SOB. Swelling started in the L foot, now is in the R. Son states both feet are equally swollen. Per protocol son advised pt should be seen within 4 hrs. No availability in the office during that time frame. RN scheduled pt at [facility] office for today 3/4 at 1400, close proximity to pt. Son agreeable to that plan. RN advised son he should call us back if pt worsens or symptoms change, he verbalized understanding.    Copied from CRM [id number]. Topic: Clinical - Red Word Triage >> Dec 11, 2023 11:11 AM [place] J wrote: [facility] that prompted transfer to Nurse Triage: Both feet are swollen with slight pain. Reason for Disposition  [1] Thigh, calf, or ankle swelling AND [2] bilateral AND [3] 1 side is more swollen  Answer Assessment - Initial Assessment Questions 1. ONSET: "When did the swelling start?" (e.g., minutes, hours, days)      Friday 2. LOCATION: "What part of the leg is swollen?"  "Are both legs swollen or just one leg?"     Calves to feet (top of the foot to where she has her socks on) - mostly her L, now it's in her R 3. SEVERITY: "How bad is the swelling?" (e.g., localized; mild, moderate, severe)   - Localized: Small area of swelling localized to one leg.   - MILD pedal edema: Swelling limited to foot and ankle, pitting edema < 1/4 inch (6 mm) deep, rest and elevation eliminate most or all swelling.   - MODERATE edema: Swelling of lower leg to knee, pitting edema > 1/4 inch (6 mm) deep, rest and elevation only partially reduce swelling.   - SEVERE edema: Swelling extends above knee, facial or hand swelling present.      Cannot get shoes on d/t swelling - swelling up to calves. 4. REDNESS: "Does the swelling look red or infected?"     No redness other than where she scratched, "she's itchy because they took her off Effexor when she was in the hospital" (recent admission) 5. PAIN: "Is the swelling painful to touch?" If Yes, ask: "How painful is it?"   (Scale 1-10; mild, moderate or severe)     2/10 6. FEVER: "Do you have a fever?" If Yes, ask: "What is it, how was it measured, and when did it start?"      None 7. CAUSE: "What do you think is causing the leg swelling?"     Not sure  8. MEDICAL HISTORY: "Do you have a history of  blood clots (e.g., DVT), cancer, heart failure, kidney disease, or liver failure?"     HTN, Afib, COPD, no hx of blood clots - on Eliquis 9. RECURRENT SYMPTOM: "Have you had leg swelling before?" If Yes, ask: "When was the last time?" "What happened that time?"     No 10. OTHER SYMPTOMS: "Do you have any other symptoms?" (e.g., chest pain, difficulty breathing)       "They really don't hurt that bad", son states edema is pitting, no CP or SOB, no knot under the skin, "maybe a bit hot to the touch", no drainage or discharge, no skin color changes, toes are not swollen (mostly top of the foot,  ankles, and up, don't go way up)  Protocols used: Leg Swelling and Edema-A-AH

## 2023-12-11 NOTE — Assessment & Plan Note (Signed)
 Trace to 1+ edema lower extremities but otherwise clinically fluid overloaded No evidence for pneumonia Follow-up CTA PE study to evaluate for PE--> negative for PE Did have a mild COPD exacerbation treated in the ED Continue to monitor respiratory status

## 2023-12-12 ENCOUNTER — Encounter: Payer: Self-pay | Admitting: Internal Medicine

## 2023-12-12 DIAGNOSIS — E871 Hypo-osmolality and hyponatremia: Secondary | ICD-10-CM | POA: Diagnosis not present

## 2023-12-12 LAB — BASIC METABOLIC PANEL
Anion gap: 9 (ref 5–15)
Anion gap: 8 (ref 5–15)
BUN: 12 mg/dL (ref 8–23)
BUN: 11 mg/dL (ref 8–23)
CO2: 22 mmol/L (ref 22–32)
CO2: 23 mmol/L (ref 22–32)
Calcium: 8.2 mg/dL — ABNORMAL LOW (ref 8.9–10.3)
Calcium: 8.2 mg/dL — ABNORMAL LOW (ref 8.9–10.3)
Chloride: 92 mmol/L — ABNORMAL LOW (ref 98–111)
Chloride: 92 mmol/L — ABNORMAL LOW (ref 98–111)
Creatinine, Ser: 0.95 mg/dL (ref 0.44–1.00)
Creatinine, Ser: 0.96 mg/dL (ref 0.44–1.00)
GFR, Estimated: >60 mL/min (ref >60)
GFR, Estimated: >60 mL/min (ref >60)
Glucose, Bld: 114 mg/dL — ABNORMAL HIGH (ref 70–99)
Glucose, Bld: 141 mg/dL — ABNORMAL HIGH (ref 70–99)
Potassium: 4 mmol/L (ref 3.5–5.1)
Potassium: 3.8 mmol/L (ref 3.5–5.1)
Sodium: 123 mmol/L — ABNORMAL LOW (ref 135–145)
Sodium: 123 mmol/L — ABNORMAL LOW (ref 135–145)

## 2023-12-12 LAB — CBC
HCT: 24.8 % — ABNORMAL LOW (ref 36.0–46.0)
Hemoglobin: 8.8 g/dL — ABNORMAL LOW (ref 12.0–15.0)
MCH: 34 pg (ref 26.0–34.0)
MCHC: 35.5 g/dL (ref 30.0–36.0)
MCV: 95.8 fL (ref 80.0–100.0)
Platelets: 433 10*3/uL — ABNORMAL HIGH (ref 150–400)
RBC: 2.59 MIL/uL — ABNORMAL LOW (ref 3.87–5.11)
RDW: 13.7 % (ref 11.5–15.5)
WBC: 10.4 10*3/uL (ref 4.0–10.5)
nRBC: 0 % (ref 0.0–0.2)

## 2023-12-12 LAB — URIC ACID: Uric Acid, Serum: 3.6 mg/dL (ref 2.5–7.1)

## 2023-12-12 LAB — NA AND K (SODIUM & POTASSIUM), RAND UR
Potassium Urine: 22 mmol/L
Sodium, Ur: 15 mmol/L

## 2023-12-12 LAB — SODIUM
Sodium: 120 mmol/L — ABNORMAL LOW (ref 135–145)
Sodium: 132 mmol/L — ABNORMAL LOW (ref 135–145)

## 2023-12-12 LAB — TSH: TSH: 3.401 u[IU]/mL (ref 0.350–4.500)

## 2023-12-12 MED ORDER — DEXTROSE 5 % IV SOLN
INTRAVENOUS | Status: DC
Start: 2023-12-12 — End: 2023-12-13

## 2023-12-12 MED ORDER — TOLVAPTAN 15 MG PO TABS
15.0000 mg | ORAL_TABLET | Freq: Once | ORAL | Status: AC
  Administered 2023-12-12: 15 mg via ORAL
  Filled 2023-12-12 (×2): qty 1

## 2023-12-12 MED ORDER — NICOTINE 21 MG/24HR TD PT24
21.0000 mg | MEDICATED_PATCH | Freq: Every day | TRANSDERMAL | Status: DC
  Administered 2023-12-12 – 2023-12-14 (×3): 21 mg via TRANSDERMAL
  Filled 2023-12-12 (×3): qty 1

## 2023-12-12 MED ORDER — SODIUM CHLORIDE 0.9 % IV BOLUS
500.0000 mL | Freq: Once | INTRAVENOUS | Status: AC
  Administered 2023-12-12: 500 mL via INTRAVENOUS

## 2023-12-12 NOTE — ED Notes (Signed)
 Confirmed [facility] cardiac monitoring.

## 2023-12-12 NOTE — Plan of Care (Signed)

## 2023-12-12 NOTE — Consult Note (Signed)
 PHARMACY CONSULT NOTE - Tolvaptan  Pharmacy Consult for Tolvaptan Monitoring  Recent Labs: Potassium (mmol/L)  Date Value  12/12/2023 4.0   Magnesium (mg/dL)  Date Value  16/07/9603 1.7   Calcium (mg/dL)  Date Value  54/06/8118 8.2 (L)   Albumin (g/dL)  Date Value  14/78/2956 4.0   Phosphorus (mg/dL)  Date Value  21/30/8657 3.4   Sodium (mmol/L)  Date Value  12/12/2023 123 (L)   Assessment  Morgan [name] is a 78 y.o. female presenting with leg swelling. Patient was recently admitted from 2/12-2/22/25 with SIADH related hyponatremia that responded to tolvaptan.  Patient found to have serum Na of 121 on admission. Pharmacy has been consulted to monitor tolvaptan.  Pertinent medications: None Drug interactions: N/A  Goal of Therapy:  Na > 130 Do not exceed increase in Na by 8 mEq/L per 8 hours or 12 mEq/L in 24 hours  Plan:  Give tolvaptan 15 mg x 1 Check Na Q8H x 2 then daily thereafter Continue to monitor for signs of clinical improvement and recommendations per nephrology  Littie Deeds, PharmD Pharmacy Resident  12/12/2023 9:21 AM
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
## 2023-12-12 NOTE — ED Notes (Signed)
 Pt assisted in the bathroom and was assisted back to her bed.

## 2023-12-12 NOTE — Progress Notes (Addendum)
 Progress Note   Patient: Morgan [name] DOB: 04-22-1946 DOA: 12/11/2023     1 DOS: the patient was seen and examined on 12/12/2023   Brief hospital course:  HPI on admission 12/11/2023 PM: "78 y.o. female with medical history significant for COPD, HTN, HLD, depression/anxiety, tobacco use , recently admitted from 2/12 to 12/01/2023 with delirium of multifactorial etiology with stay complicated by development of new onset rapid A-fib required DCCV and amiodarone, SIADH related hyponatremia treated with tolvaptan, and acute urinary retention briefly requiring Foley, who presents to the ED with difficulty breathing and lower extremity edema associated with intermittent chest pain.  She was seen by her PCP  on 2/26 and sodium was 124. Marland Kitchen.." See H&P for full HPI on admission & ED course.  Further hospital course and management as outlined below.   3/5: nephrology gave tolvaptan.  Na 120 >> 123 >> 123.    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  BP's improved. --Maintain MAP > 65  Acute dyspnea COPD with mild acute exacerbation  Trace to 1+ edema lower extremities but otherwise does not appear fluid overloaded No evidence for pneumonia CTA chest ruled out PE --Bronchodilators --Will add steroids if persistent wheezing, but overall seems stable --Monitor respiratory status closely  Bilateral lower extremity edema - suspect third-spacing or venous stasis. Note that pt's  albumin was low 2.9 back in February, but has since recovered to normal. Possible venous stasis.  No history of CHF No prior history of CHF, BNP 97, chest x-ray without pulmonary vascular congestion Patient had echocardiogram on 11/21/2023 that showed EF 60 to 65% and indeterminate diastolic parameters BLE doppler U/S negative for DVT's --Daily weights with intake and output monitoring --Keep legs elevated --No diuresis for now given hyponatremia  Atrial fibrillation  Recent onset in February 2025 requiring DCCV and amiodarone infusion --Continue amiodarone and metoprolol --Continue Eliquis  COPD with acute exacerbation  Patient was wheezing on arrival to the ED, resolved following 2 DuoNebs --Continue home inhalers with DuoNebs as needed  Chronic back pain / Scoliosis / History of lumbar spondylosis s/p fusion --Tylenol PRN --Pt does not like narcotics --PT evaluation when sodium level is improved --Chart reviewed, pt has been told she is not a surgical candidate for further intervention due to "weak bones"  Tobacco use disorder Patient is a 1 pack-a-day smoker.  Has been smoking since age 31 Counseled on cutting back and quitting-states she was counseled during her recent hospitalization but quitting cold [place] did not work, especially as her Effexor was discontinued due to SIADH --Nicotine patch  Anxiety and depression Venlafaxine recently discontinued due to SIADH with severe hyponatremia --Continue buspirone 5 mg twice daily  Essential hypertension --Continue metoprolol, hydralazine and irbesartan        Subjective: Pt seen awake sitting up in bed this AM.  She reports back pain, chronic issue but currently bothering her more than usual.  Reports getting harder to walk recently due to pain.  Multiple back surgeries.  Dislikes narcotics for pain, takes Tylenol  at home. Denies other acute complaints.     Physical Exam: Vitals:   12/12/23 1249 12/12/23 1300 12/12/23  1546 12/12/23 1600  BP: (!) 91/48 (!) 83/52 112/70 110/62  Pulse: 90  79   Resp: 18  18   Temp: 98 F (36.7 C)  97.9 F (36.6 C)   TempSrc: Oral     SpO2: 99%  100%   Weight:      Height:       General exam: awake, alert, no acute distress HEENT: moist mucus membranes, hearing grossly normal  Respiratory system: CTA, no wheezes, rales or rhonchi, normal respiratory effort. Cardiovascular system: normal S1/S2, RRR, no JVD, murmurs, rubs, gallops, no pedal edema.   Gastrointestinal system: soft, NT, ND, no HSM felt, +bowel sounds. Central nervous system: A&O x 2+. no gross focal neurologic deficits, normal speech Extremities: moves all, no edema, normal tone Skin: dry, intact, normal temperature Psychiatry: normal mood, congruent affect, judgement and insight appear normal   Data Reviewed:  Notable labs --   Na trend: 120 >> 123 >> 123  Cl 92, glucose 141, Ca 8.2, Hbg 8.8 from 10.0   Family Communication: Called son, Thayer Ohm, and updated by phone this afternoon.   Disposition: Status is: Inpatient Remains inpatient appropriate because: hyponatremia closely monitoring labs until improvement to safe level. Not yet cleared by nephrology.   Planned Discharge Destination: Home / prior     Time spent: 45 minutes  Author: Pennie Banter, DO 12/12/2023 4:41 PM  For on call review [email or web address].

## 2023-12-12 NOTE — Consult Note (Signed)
 PHARMACY CONSULT NOTE - Tolvaptan  Pharmacy Consult for Tolvaptan Monitoring  Recent Labs: Potassium (mmol/L)  Date Value  12/12/2023 3.8   Magnesium (mg/dL)  Date Value  60/45/4098 1.7   Calcium (mg/dL)  Date Value  11/91/4782 8.2 (L)   Albumin (g/dL)  Date Value  95/62/1308 4.0   Phosphorus (mg/dL)  Date Value  65/78/4696 3.4   Sodium (mmol/L)  Date Value  12/12/2023 132 (L)   Assessment  Morgan [name] is a 78 y.o. female presenting with leg swelling. Patient was recently admitted from 2/12-2/22/25 with SIADH related hyponatremia that responded to tolvaptan.  Patient found to have serum Na of 121 on admission. Pharmacy has been consulted to monitor tolvaptan.  Pertinent medications: None Drug interactions: N/A  Goal of Therapy:  Na > 130 Do not exceed increase in Na by 8 mEq/L per 8 hours or 12 mEq/L in 24 hours  3/5 0921 Na 123 3/5 1109 Tolvaptan 15 mg x 1 3/5 1710 Na 132  Plan:  First check after tolvapan dose 132 (>8 mEq/L in 8 hours) Per Dr. Wynelle Link begin D5W @ 50 ml/hr x 12 hours Check Na Q8H x 2 then daily thereafter Continue to monitor for signs of clinical improvement and recommendations per nephrology  Barrie Folk, PharmD 12/12/2023 6:41 PM

## 2023-12-12 NOTE — Assessment & Plan Note (Signed)
 Patient is a 1 pack-a-day smoker.  Has been smoking since age 78 Counseled on cutting back and quitting-states she was counseled during her recent hospitalization but quitting cold [place] did not work, especially as her Effexor was discontinued due to SIADH Nicotine patch

## 2023-12-13 ENCOUNTER — Ambulatory Visit: Payer: PPO | Admitting: Internal Medicine

## 2023-12-13 DIAGNOSIS — E871 Hypo-osmolality and hyponatremia: Secondary | ICD-10-CM | POA: Diagnosis not present

## 2023-12-13 LAB — BASIC METABOLIC PANEL
Anion gap: 6 (ref 5–15)
BUN: 8 mg/dL (ref 8–23)
CO2: 24 mmol/L (ref 22–32)
Calcium: 8.6 mg/dL — ABNORMAL LOW (ref 8.9–10.3)
Chloride: 107 mmol/L (ref 98–111)
Creatinine, Ser: 0.87 mg/dL (ref 0.44–1.00)
GFR, Estimated: >60 mL/min (ref >60)
Glucose, Bld: 110 mg/dL — ABNORMAL HIGH (ref 70–99)
Potassium: 3.9 mmol/L (ref 3.5–5.1)
Sodium: 137 mmol/L (ref 135–145)

## 2023-12-13 LAB — CBC
HCT: 26 % — ABNORMAL LOW (ref 36.0–46.0)
Hemoglobin: 9.2 g/dL — ABNORMAL LOW (ref 12.0–15.0)
MCH: 33.5 pg (ref 26.0–34.0)
MCHC: 35.4 g/dL (ref 30.0–36.0)
MCV: 94.5 fL (ref 80.0–100.0)
Platelets: 483 10*3/uL — ABNORMAL HIGH (ref 150–400)
RBC: 2.75 MIL/uL — ABNORMAL LOW (ref 3.87–5.11)
RDW: 14.2 % (ref 11.5–15.5)
WBC: 8 10*3/uL (ref 4.0–10.5)
nRBC: 0 % (ref 0.0–0.2)

## 2023-12-13 LAB — CORTISOL-AM, BLOOD: Cortisol - AM: 8 ug/dL (ref 6.7–22.6)

## 2023-12-13 LAB — UREA NITROGEN, URINE: Urea Nitrogen, Ur: 233 mg/dL

## 2023-12-13 LAB — SODIUM: Sodium: 134 mmol/L — ABNORMAL LOW (ref 135–145)

## 2023-12-13 MED ORDER — NYSTATIN 100000 UNIT/ML MT SUSP
5.0000 mL | Freq: Four times a day (QID) | OROMUCOSAL | Status: DC
  Administered 2023-12-13 – 2023-12-14 (×5): 500000 [IU] via ORAL
  Filled 2023-12-13 (×5): qty 5

## 2023-12-13 MED ORDER — DM-GUAIFENESIN ER 30-600 MG PO TB12
1.0000 | ORAL_TABLET | Freq: Two times a day (BID) | ORAL | Status: DC
  Administered 2023-12-13 – 2023-12-14 (×3): 1 via ORAL
  Filled 2023-12-13 (×3): qty 1

## 2023-12-13 MED ORDER — AMOXICILLIN-POT CLAVULANATE 875-125 MG PO TABS
1.0000 | ORAL_TABLET | Freq: Two times a day (BID) | ORAL | Status: DC
  Administered 2023-12-13 – 2023-12-14 (×3): 1 via ORAL
  Filled 2023-12-13 (×3): qty 1

## 2023-12-13 MED ORDER — LIDOCAINE 5 % EX PTCH
1.0000 | MEDICATED_PATCH | CUTANEOUS | Status: DC
  Administered 2023-12-13: 1 via TRANSDERMAL
  Filled 2023-12-13 (×2): qty 1

## 2023-12-13 NOTE — Progress Notes (Addendum)
 Progress Note   Patient: Morgan [name] [facility] ZOX:[id number] DOB: 07-Aug-1946 DOA: 12/11/2023     2 DOS: the patient was seen and examined on 12/13/2023   Brief hospital course:  HPI on admission 12/11/2023 PM: "78 y.o. female with medical history significant for COPD, HTN, HLD, depression/anxiety, tobacco use , recently admitted from 2/12 to 12/01/2023 with delirium of multifactorial etiology with stay complicated by development of new onset rapid A-fib required DCCV and amiodarone, SIADH related hyponatremia treated with tolvaptan, and acute urinary retention briefly requiring Foley, who presents to the ED with difficulty breathing and lower extremity edema associated with intermittent chest pain.  She was seen by her PCP  on 2/26 and sodium was 124. Marland Kitchen.." See H&P for full HPI on admission & ED course.  Further hospital course and management as outlined below.   3/5: nephrology gave tolvaptan.  Na 120 >> 123 >> 123.    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  BP's improved. --Maintain MAP > 65  Acute RIGHT 9th and 10th Rib Fractures Pt admits to recently falling.   --Lidocaine patch --Tylenol PRN --IS and flutter  --PT/OT evaluations  Possible LUL Pneumonia - pt with productive cough, CTA chest LUL ground glass opacity, at risk for pneumonia with acute rib fractures, intermittently hypotensive. --Start 5 day course of Augmentin --Mucinex --Pain control for rib fractures as  above --Monitor fever curve, CBC, BP  Acute dyspnea COPD with mild acute exacerbation  Trace to 1+ edema lower extremities but otherwise does not appear fluid overloaded No evidence for pneumonia CTA chest ruled out PE --Bronchodilators --Will add steroids if persistent wheezing, but overall seems stable --Monitor respiratory status closely  Bilateral lower extremity edema - suspect third-spacing or venous stasis. Note that pt's albumin was low 2.9 back in February, but has since recovered to normal. Possible venous stasis.  No history of CHF No prior history of CHF, BNP 97, chest x-ray without pulmonary vascular congestion Patient had echocardiogram on 11/21/2023 that showed EF 60 to 65% and indeterminate diastolic parameters BLE doppler U/S negative for DVT's --Daily weights with intake and output monitoring --Keep legs elevated --No diuresis for now given hyponatremia  Atrial fibrillation  Recent onset in February 2025 requiring DCCV and amiodarone infusion --Continue amiodarone and metoprolol --Continue Eliquis  COPD with acute exacerbation  Patient was wheezing on arrival to the ED, resolved following 2 DuoNebs --Continue home inhalers with DuoNebs as needed  Chronic back pain / Scoliosis / History of lumbar spondylosis s/p fusion Hx of Vertebral Compression Fractures --Tylenol PRN --Pt does not like narcotics --PT evaluation  --Chart reviewed, pt has been told she is not a surgical candidate for further intervention due to "weak bones"  Tobacco use disorder Patient is a 1 pack-a-day smoker.  Has been smoking since age 21 Counseled on cutting back and quitting-states she was counseled during her recent hospitalization but quitting cold [place] did not work, especially as her Effexor was discontinued due to SIADH --Nicotine patch  Anxiety and depression Venlafaxine recently discontinued due  to SIADH with severe hyponatremia --Continue buspirone 5 mg twice  daily  Essential hypertension --Continue metoprolol, hydralazine and irbesartan        Subjective: Pt seen awake sitting up in bed this AM.  She reports productive cough that's started since coming in.  No fever or chills.  When asked about right rib fractures, she does endorse pain on that side with cough or deep inspiration.  Has ongoing back pain worse on left that has recently impacted her walking significantly.  Lives alone and expresses concern she needs help at home.   Physical Exam: Vitals:   12/13/23 0132 12/13/23 0319 12/13/23 0807 12/13/23 1232  BP: 120/60 123/61 138/73 (!) 95/52  Pulse: 87 77 76 89  Resp: 17  16 16   Temp: 98.2 F (36.8 C) 98.8 F (37.1 C) 98.3 F (36.8 C)   TempSrc: Oral Oral Oral   SpO2: 97% 98% 98% 97%  Weight:      Height:       General exam: awake, alert, no acute distress HEENT: moist mucus membranes, hearing grossly normal  Respiratory system: CTA diminished bases, productive sounding cough, no wheezes or rhonchi, normal respiratory effort at rest on room air. Cardiovascular system: normal S1/S2, RRR Gastrointestinal system: soft, NT, ND Central nervous system: A&O x 2+. no gross focal neurologic deficits, normal speech Extremities: moves all, no edema, normal tone Skin: dry, intact, normal temperature Psychiatry: normal mood, congruent affect, judgement and insight appear normal   Data Reviewed:  Notable labs --   Na trend: 120 >> 123 >> 132 (started on dextrose fluid) >> 134 >> 137  BMP today with glucose 110, Ca 8.6 otherwise normal.   Family Communication: Called son, Thayer Ohm, and updated by phone 3/5 afternoon. None present on rounds, will attempt to call as time allows.   Disposition: Status is: Inpatient Remains inpatient appropriate because: hyponatremia closely monitoring labs until improvement to safe level. Not yet cleared by nephrology.   Planned Discharge Destination: Home / prior     Time spent: 45  minutes  Author: Pennie Banter, DO 12/13/2023 1:15 PM  For on call review [email or web address].

## 2023-12-13 NOTE — Progress Notes (Signed)
 Occupational Therapy Evaluation Patient Details Name: Morgan [name] [facility] MRN: [id number] DOB: 1946-05-20 Today's Date: 12/13/2023   History of Present Illness   Morgan [name] is a 77yoF who comes to [facility] on 12/11/23 after a fall at home, increased LEE x1 week, nausea. Pt admitted with hyponatremia. PMH: HTN, HLD, chronic low back pain, s/p kyphoplasty. Na+: 124. Imagine revealing of  "Interval age indeterminate moderate compression fracture at T3, new since chest CT from September 2024. Possible subacute superior endplate deformity at L2." And "acute right ninth and tenth rib fractures."     Clinical Impressions Pt was seen for OT evaluation this date. Prior to hospital admission, pt was MODI/Indep, living alone in a  house, able to live on the main floor. Pt presents to acute OT demonstrating impaired ADL performance and functional mobility 2/2 (See OT problem list for additional functional deficits). Pt currently requires CGA for STS from recliner with no physical assistance. Pt donned socks while seated in recliner with supervision, pt utilized the figure 4 method to donn socks. Pt amb with RW + CGA for safety to sink within room. Participated in sink level grooming task; CGA, no LOB noted during dynamic standing tasks. Pt returned to recliner then t/f to bed for transfer to new room. Pt utilized 1 HHA  for short 2 steps to bed in prep for transfer. Pt educated on pain management strategies and self advocating for her needs. Pt would benefit from skilled OT services to address noted impairments and functional limitations (see below for any additional details) in order to maximize safety and independence while minimizing falls risk and caregiver burden. OT will follow acutely.     If plan is discharge home, recommend the following:   A little help with walking and/or transfers;A little help with bathing/dressing/bathroom;Assistance with cooking/housework;Assist for transportation;Help with stairs or  ramp for entrance     Functional Status Assessment   Patient has had a recent decline in their functional status and demonstrates the ability to make significant improvements in function in a reasonable and predictable amount of time.     Equipment Recommendations   None recommended by OT     Recommendations for Other Services         Precautions/Restrictions   Precautions Precautions: Fall Recall of Precautions/Restrictions: Intact Restrictions Weight Bearing Restrictions Per Provider Order: No     Mobility Bed Mobility Overal bed mobility: Modified Independent Bed Mobility: Sit to Supine       Sit to supine: Modified independent (Device/Increase time), Used rails        Transfers Overall transfer level: Needs assistance Equipment used: Rolling walker (2 wheels) Transfers: Sit to/from Stand Sit to Stand: Contact guard assist           General transfer comment: No physical assistance required, pt states she is feeling better, it just takes a minute once she is up      Balance Overall balance assessment: Needs assistance Sitting-balance support: No upper extremity supported, Feet supported Sitting balance-Leahy Scale: Normal     Standing balance support: No upper extremity supported, During functional activity Standing balance-Leahy Scale: Fair Standing balance comment: No LOB noted during dynmamic standing while completing sink level grooming tasks                           ADL either performed or assessed with clinical judgement   ADL Overall ADL's : Needs assistance/impaired Eating/Feeding: Independent   Grooming: Wash/dry face;Wash/dry  hands;Oral care;Brushing hair;Standing;Contact guard assist               Lower Body Dressing: Supervision/safety (Donning socks in sitting)   Toilet Transfer: Contact guard assist;Rolling walker (2 wheels) (Simulated wtih recliner)           Functional mobility during ADLs: Rolling  walker (2 wheels);Contact guard assist General ADL Comments:  (No physical assistance required, CGA for safety)      Pertinent Vitals/Pain Pain Assessment Pain Assessment: 0-10 Pain Score: 4  Pain Location: left hip (suspect referral from L2 deformty) Pain Descriptors / Indicators: Aching Pain Intervention(s): Limited activity within patient's tolerance, Monitored during session, Repositioned     Extremity/Trunk Assessment Upper Extremity Assessment Upper Extremity Assessment: Overall WFL for tasks assessed;Generalized weakness   Lower Extremity Assessment Lower Extremity Assessment: Defer to PT evaluation;Overall [facility] for tasks assessed   Cervical / Trunk Assessment Cervical / Trunk Assessment: Normal   Communication Communication Communication: Impaired Factors Affecting Communication: Hearing impaired   Cognition Arousal: Alert Behavior During Therapy: WFL for tasks assessed/performed Cognition: No apparent impairments             OT - Cognition Comments: Pt A/Ox4                 Following commands: Intact       Cueing  General Comments   Cueing Techniques: Verbal cues      Exercises Exercises: Other exercises Other Exercises Other Exercises: Edu: role of OT, safe ADL completion with use of DME, pain management tehcniques (pursed lip breathing)   Shoulder Instructions      Home Living Family/patient expects to be discharged to:: Private residence Living Arrangements: Alone Available Help at Discharge: Family;Available PRN/intermittently;Friend(s);Neighbor Type of Home: House Home Access: Level entry     Home Layout: Two level;Able to live on main level with bedroom/bathroom     Bathroom Shower/Tub: Chief Strategy Officer: Standard     Home Equipment: Agricultural consultant (2 wheels);Rollator (4 wheels);Grab bars - tub/shower;Toilet riser;Shower seat          Prior Functioning/Environment Prior Level of Function :  Independent/Modified Independent;Driving             Mobility Comments: has been using waker since she fell, typically will use a SPC when going out. Still grocery shops. ADLs Comments: Indep with ADLs/ADLs, driving and grocery shopping. Has a Bertram [place] dog, [address]. Uses vac. that operates itself.    OT Problem List: Decreased activity tolerance;Decreased safety awareness;Decreased knowledge of use of DME or AE   OT Treatment/Interventions: Self-care/ADL training;Therapeutic exercise;Energy conservation;DME and/or AE instruction;Therapeutic activities;Patient/family education      OT Goals(Current goals can be found in the care plan section)   Acute Rehab OT Goals Patient Stated Goal: to return home to her dog OT Goal Formulation: With patient Time For Goal Achievement: 12/27/23 Potential to Achieve Goals: Good ADL Goals Pt Will Perform Grooming: with modified independence;standing Pt Will Perform Lower Body Dressing: with modified independence Pt Will Transfer to Toilet: with modified independence;ambulating Pt Will Perform Toileting - Clothing Manipulation and hygiene: with modified independence;sit to/from stand   OT Frequency:  Min 2X/week    Co-evaluation              AM-PAC OT "6 Clicks" Daily Activity     Outcome Measure Help from another person eating meals?: None Help from another person taking care of personal grooming?: A Little Help from another person toileting, which includes using toliet,  bedpan, or urinal?: A Little Help from another person bathing (including washing, rinsing, drying)?: A Lot Help from another person to put on and taking off regular upper body clothing?: None Help from another person to put on and taking off regular lower body clothing?: A Little 6 Click Score: 19   End of Session Equipment Utilized During Treatment: Gait belt;Rolling walker (2 wheels) Nurse Communication: Mobility status  Activity Tolerance: Patient tolerated  treatment well Patient left: in bed (with transfer staff present. preparing pt for room change)  OT Visit Diagnosis: Unsteadiness on feet (R26.81);Other abnormalities of gait and mobility (R26.89);Muscle weakness (generalized) (M62.81);Pain Pain - part of body:  (Ribs)                Time: 1610-9604 OT Time Calculation (min): 20 min Charges:  OT General Charges $OT Visit: 1 Visit OT Evaluation $OT Eval Moderate Complexity: 1 Mod  Jahaad Penado M.S. OTR/L  12/13/23, 4:07 PM

## 2023-12-13 NOTE — Plan of Care (Signed)

## 2023-12-13 NOTE — Progress Notes (Signed)
 [facility]  ROUNDING NOTE   Subjective:   Patient seen and evaluated sitting up Remains in good spirits Tolerating meals without nausea or vomiting  Sodium 137 Concern for overcorrection overnight, patient giving D5 infusion  Objective:  Vital signs in last 24 hours:  Temp:  [97.9 F (36.6 C)-98.8 F (37.1 C)] 98.3 F (36.8 C) (03/06 0807) Pulse Rate:  [76-90] 76 (03/06 0807) Resp:  [16-18] 16 (03/06 0807) BP: (83-138)/(48-73) 138/73 (03/06 0807) SpO2:  [97 %-100 %] 98 % (03/06 0807)  Weight change:  Filed Weights   12/11/23 1918  Weight: 57.6 kg    Intake/Output: I/O last 3 completed shifts: In: 705.3 [P.O.:60; I.V.:257.5; IV Piggyback:387.8] Out: -    Intake/Output this shift:  Total I/O In: 150 [P.O.:150] Out: -   Physical Exam: General: NAD, sitting up in bed  Head: Normocephalic, atraumatic. Moist oral mucosal membranes  Eyes: Anicteric  Lungs:  Clear to auscultation, normal effort  Heart: Regular rate and rhythm  Abdomen:  Soft, nontender, nondistended  Extremities: No peripheral edema.  Neurologic: Nonfocal, moving all four extremities  Skin: No lesions  Access: None    Basic Metabolic Panel: Recent Labs  Lab 12/11/23 1938 12/12/23 0117 12/12/23 0651 12/12/23 0921 12/12/23 1710 12/13/23 0121 12/13/23 0458  NA 121*   < > 123* 123* 132* 134* 137  K 3.9  --  4.0 3.8  --   --  3.9  CL 90*  --  92* 92*  --   --  107  CO2 20*  --  22 23  --   --  24  GLUCOSE 96  --  114* 141*  --   --  110*  BUN 10  --  12 11  --   --  8  CREATININE 0.91  --  0.95 0.96  --   --  0.87  CALCIUM 8.8*  --  8.2* 8.2*  --   --  8.6*   < > = values in this interval not displayed.    Liver Function Tests: Recent Labs  Lab 12/11/23 1938  AST 25  ALT 21  ALKPHOS 86  BILITOT 0.5  PROT 6.7  ALBUMIN 4.0   No results for input(s): "LIPASE", "AMYLASE" in the last 168 hours. No results for input(s): "AMMONIA" in the last 168 hours.  CBC: Recent  Labs  Lab 12/11/23 1938 12/12/23 0651 12/13/23 0458  WBC 10.6* 10.4 8.0  NEUTROABS 8.1*  --   --   HGB 10.0* 8.8* 9.2*  HCT 28.6* 24.8* 26.0*  MCV 96.0 95.8 94.5  PLT 512* 433* 483*    Cardiac Enzymes: No results for input(s): "CKTOTAL", "CKMB", "CKMBINDEX", "TROPONINI" in the last 168 hours.  BNP: Invalid input(s): "POCBNP"  CBG: No results for input(s): "GLUCAP" in the last 168 hours.  Microbiology: Results for orders placed or performed during the hospital encounter of 11/21/23  Resp panel by RT-PCR (RSV, Flu A&B, Covid) Anterior Nasal Swab     Status: None   Collection Time: 11/21/23 11:42 AM   Specimen: Anterior Nasal Swab  Result Value Ref Range Status   SARS Coronavirus 2 by RT PCR NEGATIVE NEGATIVE Final   Influenza A by PCR NEGATIVE NEGATIVE Final   Influenza B by PCR NEGATIVE NEGATIVE Final    Comment: (NOTE) The Xpert Xpress SARS-CoV-2/FLU/RSV plus assay is intended as an aid in the diagnosis of influenza from Nasopharyngeal swab specimens and should not be used as a sole basis for treatment. Nasal washings  and aspirates are unacceptable for Xpert Xpress SARS-CoV-2/FLU/RSV testing.  Fact Sheet for Patients: BloggerCourse.com  Fact Sheet for Healthcare Providers: SeriousBroker.it  This test is not yet approved or cleared by the [place] FDA and has been authorized for detection and/or diagnosis of SARS-CoV-2 by FDA under an Emergency Use Authorization (EUA). This EUA will remain in effect (meaning this test can be used) for the duration of the COVID-19 declaration under Section 564(b)(1) of the Act, 21 U.S.C. section 360bbb-3(b)(1), unless the authorization is terminated or revoked.     Resp Syncytial Virus by PCR NEGATIVE NEGATIVE Final    Comment: (NOTE) Fact Sheet for Patients: BloggerCourse.com  Fact Sheet for Healthcare  Providers: SeriousBroker.it  This test is not yet approved or cleared by the [place] FDA and has been authorized for detection and/or diagnosis of SARS-CoV-2 by FDA under an Emergency Use Authorization (EUA). This EUA will remain in effect (meaning this test can be used) for the duration of the COVID-19 declaration under Section 564(b)(1) of the Act, 21 U.S.C. section 360bbb-3(b)(1), unless the authorization is terminated or revoked.  Performed at [facility], [address], [place], [place] [address]   MRSA Next Gen by PCR, Nasal     Status: None   Collection Time: 11/24/23  2:51 AM   Specimen: Nasal Mucosa; Nasal Swab  Result Value Ref Range Status   MRSA by PCR Next Gen NOT DETECTED NOT DETECTED Final    Comment: (NOTE) The GeneXpert MRSA Assay (FDA approved for NASAL specimens only), is one component of a comprehensive MRSA colonization surveillance program. It is not intended to diagnose MRSA infection nor to guide or monitor treatment for MRSA infections. Test performance is not FDA approved in patients less than 62 years old. Performed at [facility], [address], [place], [place] [address]     Coagulation Studies: No results for input(s): "LABPROT", "INR" in the last 72 hours.  Urinalysis: Recent Labs    12/11/23 2221  COLORURINE YELLOW*  LABSPEC 1.023  PHURINE 6.0  GLUCOSEU 50*  HGBUR SMALL*  BILIRUBINUR NEGATIVE  KETONESUR NEGATIVE  PROTEINUR NEGATIVE  NITRITE NEGATIVE  LEUKOCYTESUR NEGATIVE      Imaging: US Venous Img Lower Bilateral (DVT) Result Date: 12/12/2023 CLINICAL DATA:  Bilateral leg swelling. EXAM: BILATERAL LOWER EXTREMITY VENOUS DOPPLER ULTRASOUND TECHNIQUE: Gray-scale sonography with graded compression, as well as color Doppler and duplex ultrasound were performed to evaluate the lower extremity deep venous systems from the level of the common femoral vein and including the common femoral,  femoral, profunda femoral, popliteal and calf veins including the posterior tibial, peroneal and gastrocnemius veins when visible. The superficial great saphenous vein was also interrogated. Spectral Doppler was utilized to evaluate flow at rest and with distal augmentation maneuvers in the common femoral, femoral and popliteal veins. COMPARISON:  None Available. FINDINGS: RIGHT LOWER EXTREMITY Common Femoral Vein: No evidence of thrombus. Normal compressibility, respiratory phasicity and response to augmentation. Saphenofemoral Junction: No evidence of thrombus. Normal compressibility and flow on color Doppler imaging. Profunda Femoral Vein: No evidence of thrombus. Normal compressibility and flow on color Doppler imaging. Femoral Vein: No evidence of thrombus. Normal compressibility, respiratory phasicity and response to augmentation. Popliteal Vein: No evidence of thrombus. Normal compressibility, respiratory phasicity and response to augmentation. Calf Veins: No evidence of thrombus. Normal compressibility and flow on color Doppler imaging. Superficial Great Saphenous Vein: No evidence of thrombus. Normal compressibility. Venous Reflux:  None. Other Findings: A 3.7 cm x  0.4 cm x 2.1 cm fluid collection is seen within the soft tissues of the RIGHT popliteal fossa. LEFT LOWER EXTREMITY Common Femoral Vein: No evidence of thrombus. Normal compressibility, respiratory phasicity and response to augmentation. Saphenofemoral Junction: No evidence of thrombus. Normal compressibility and flow on color Doppler imaging. Profunda Femoral Vein: No evidence of thrombus. Normal compressibility and flow on color Doppler imaging. Femoral Vein: No evidence of thrombus. Normal compressibility, respiratory phasicity and response to augmentation. Popliteal Vein: No evidence of thrombus. Normal compressibility, respiratory phasicity and response to augmentation. Calf Veins: No evidence of thrombus. Normal compressibility and flow on  color Doppler imaging. Superficial Great Saphenous Vein: No evidence of thrombus. Normal compressibility. Venous Reflux:  None. Other Findings:  None. IMPRESSION: 1. No evidence of deep venous thrombosis in either lower extremity. 2. Right Baker's cyst. Electronically Signed   By: Aram Candela M.D.   On: 12/12/2023 01:15   CT Angio Chest PE W/Cm &/Or Wo Cm Result Date: 12/11/2023 CLINICAL DATA:  Leg swelling difficulty breathing EXAM: CT ANGIOGRAPHY CHEST WITH CONTRAST TECHNIQUE: Multidetector CT imaging of the chest was performed using the standard protocol during bolus administration of intravenous contrast. Multiplanar CT image reconstructions and MIPs were obtained to evaluate the vascular anatomy. RADIATION DOSE REDUCTION: This exam was performed according to the departmental dose-optimization program which includes automated exposure control, adjustment of the mA and/or kV according to patient size and/or use of iterative reconstruction technique. CONTRAST:  75mL OMNIPAQUE IOHEXOL 350 MG/ML SOLN COMPARISON:  Chest x-ray 12/11/2023, CT chest 06/29/2023 FINDINGS: Cardiovascular: Satisfactory opacification of the pulmonary arteries to the segmental level. No evidence of pulmonary embolism. Moderate aortic atherosclerosis. No aneurysm. Coronary vascular calcification. Upper normal cardiac size. No pericardial effusion Mediastinum/Nodes: Patent trachea. No suspicious thyroid mass. No suspicious lymph nodes. Esophagus within normal limits except for small hiatal hernia Lungs/Pleura: Emphysema. Stable small pulmonary nodules for which no imaging follow-up is recommended. Small focus of ground-glass disease in the subpleural left upper lobe. Mild bibasilar atelectasis. Upper Abdomen: No acute finding Musculoskeletal: Scoliosis and multilevel degenerative changes. Treated compression deformity at T7. Chronic compression deformity at T5. Interval age indeterminate moderate compression deformity at T3. Chronic  superior endplate deformity at T11 and T12. possible subacute superior endplate deformity at L2, minimal sclerosis and endplate depression, not confidently seen on CT from February 12. Acute mildly displaced right ninth and tenth lateral rib fractures. Review of the MIP images confirms the above findings. IMPRESSION: 1. Negative for acute pulmonary embolism 2. Emphysema. Small focus of ground-glass disease at the left upper lobe likely infectious or inflammatory 3. Interval age indeterminate moderate compression fracture at T3, new since chest CT from September 2024. Possible subacute superior endplate deformity at L2 4. Acute right ninth and tenth rib fractures Aortic Atherosclerosis (ICD10-I70.0) and Emphysema (ICD10-J43.9). Electronically Signed   By: Jasmine Pang M.D.   On: 12/11/2023 23:36   DG Chest Portable 1 View Result Date: 12/11/2023 CLINICAL DATA:  Shortness of breath and leg swelling EXAM: PORTABLE CHEST 1 VIEW COMPARISON:  11/21/2023 FINDINGS: Stable cardiomediastinal silhouette. Aortic atherosclerotic calcification. Bibasilar atelectasis/scarring. Retrocardiac atelectasis or pneumonia. Findings are unchanged from 11/21/2023. No definite pleural effusion. No pneumothorax. No displaced rib fractures. IMPRESSION: Retrocardiac atelectasis or pneumonia.  Unchanged from 11/21/2023. Electronically Signed   By: Minerva Fester M.D.   On: 12/11/2023 21:34     Medications:     amiodarone  200 mg Oral Daily   apixaban  5 mg Oral BID   atorvastatin  40  mg Oral Daily   busPIRone  5 mg Oral BID   dextromethorphan-guaiFENesin  1 tablet Oral BID   fluticasone furoate-vilanterol  1 puff Inhalation Daily   And   umeclidinium bromide  1 puff Inhalation Daily   hydrALAZINE  25 mg Oral Q12H   irbesartan  300 mg Oral Daily   lidocaine  1 patch Transdermal Q24H   metoprolol succinate  25 mg Oral Daily   nicotine  21 mg Transdermal Daily   nystatin  5 mL Oral QID   acetaminophen **OR** acetaminophen,  albuterol, HYDROcodone-acetaminophen, morphine injection, ondansetron **OR** ondansetron (ZOFRAN) IV  Assessment/ Plan:  Ms. Morgan [name] is a 78 y.o.  female with past medical conditions including hypertension, COPD, hyperlipidemia, and anxiety and depression, who was admitted to [facility] on 12/11/2023 for Shortness of breath [R06.02] Hyponatremia [E87.1] Leg edema [R60.0] Right leg swelling [M79.89] Chest pain, unspecified type [R07.9]   Hyponatremia likely secondary to SIADH.  Recent admission for this concern, successfully treated with tolvaptan.  Sodium 121 on admission.  Sodium peaked to 137 during prior admission.  CT angio chest shows stable small pulmonary nodules, presence of emphysema, and suspicion for infection versus inflammation.  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   Sodium corrected to 137 today. She was given D5 at 55ml/hr to slow correction. Continue to encourage fluid restriction and salt tabs. Patient is cleared to discharge from renal stance.     LOS: 2 Treasure Ingrum 3/6/202511:17 AM

## 2023-12-13 NOTE — Evaluation (Signed)
 Physical Therapy Evaluation Patient Details Name: Morgan [name] MRN: [id number] DOB: May 14, 1946 Today's Date: 12/13/2023  History of Present Illness  Morgan [name] is a 77yoF who [facility] on [facility]23 after a fall at home, increased LEE x1 week, nausea. Pt admitted with hyponatremia. PMH: HTN, HLD, chronic low back pain, s/p kyphoplasty. Na+: 124. Imagine revealing of  "Interval age indeterminate moderate compression fracture at T3, new since chest CT from September 2024. Possible subacute superior endplate deformity at L2." And "acute right ninth and tenth rib fractures."  Clinical Impression  Pt in bed on entry, RN giving meds, getting vitals, pressures running soft, but improved when taken in standing. Pt having a great deal of pain in ribs and Left hip, but use of RW aids in improving pain control, confidence, and balance for AMB twice at household distances. Pain remains the biggest limitation in AMB distance. We speak at length regarding her basic ADL needs at DC and whether she will be able to safely navigate these. Pt will have social support ready tomorrow afternoon and feels safe returning home at that time.       If plan is discharge home, recommend the following: Assist for transportation;Assistance with cooking/housework;Help with stairs or ramp for entrance   Can travel by private vehicle        Equipment Recommendations None recommended by PT  Recommendations for Other Services       Functional Status Assessment Patient has had a recent decline in their functional status and demonstrates the ability to make significant improvements in function in a reasonable and predictable amount of time.     Precautions / Restrictions Precautions Precautions: Fall Recall of Precautions/Restrictions: Intact Restrictions Weight Bearing Restrictions Per Provider Order: No      Mobility  Bed Mobility Overal bed mobility: Modified Independent Bed Mobility: Supine to Sit      Supine to sit: HOB elevated, Used rails     General bed mobility comments: Pt requires slightly increased time and HOB elevated.    Transfers Overall transfer level: Needs assistance Equipment used: None, Rolling walker (2 wheels) Transfers: Sit to/from Stand Sit to Stand: Contact guard assist, Supervision           General transfer comment: pain intensity makes balance difficulty, pt requires 1 hand support while obtaining LUE BP;    Ambulation/Gait Ambulation/Gait assistance: Contact guard assist, Min assist Gait Distance (Feet): 120 Feet (98ft then 119ft RW (seated recovery interval between)) Assistive device: Rolling walker (2 wheels) Gait Pattern/deviations: Step-to pattern, Step-through pattern Gait velocity: 0.58m/s        Stairs            Wheelchair Mobility     Tilt Bed    Modified Rankin (Stroke Patients Only)       Balance                                             Pertinent Vitals/Pain Pain Assessment Pain Assessment: 0-10 Pain Score: 6  (5-6) Pain Location: left hip (suspect referral from L2 deformty) Pain Descriptors / Indicators: Aching Pain Intervention(s): Limited activity within patient's tolerance, Monitored during session, Premedicated before session    Home Living Family/patient expects to be discharged to:: Private residence Living Arrangements: Alone (son lives in [place], helps a lot; husband died last year.) Available Help at Discharge: Family;Available PRN/intermittently;Friend(s);Neighbor Type of Home:  House Home Access: Level entry       Home Layout: Two level;Able to live on main level with bedroom/bathroom Home Equipment: Rolling Walker (2 wheels);Rollator (4 wheels);Grab bars - tub/shower;Toilet riser;Shower seat      Prior Function Prior Level of Function : Independent/Modified Independent;Driving             Mobility Comments: has been using waker since she fell, typically will use  a SPC when going out. Still grocery shops. ADLs Comments: Indep with ADLs, iADLs, driving and grocery shopping. Has a Bertram [place] dog, Audelia Acton     Extremity/Trunk Assessment                Communication        Cognition Arousal: Alert Behavior During Therapy: WFL for tasks assessed/performed   PT - Cognitive impairments: No apparent impairments                                 Cueing       General Comments      Exercises     Assessment/Plan    PT Assessment Patient needs continued PT services  PT Problem List Decreased strength;Decreased activity tolerance;Decreased mobility       PT Treatment Interventions DME instruction;Gait training;Stair training;Therapeutic activities;Functional mobility training;Therapeutic exercise;Balance training;Patient/family education    PT Goals (Current goals can be found in the Care Plan section)  Acute Rehab PT Goals Patient Stated Goal: go home tomrrow once supervison assist is in place PT Goal Formulation: With patient Time For Goal Achievement: 12/27/23 Potential to Achieve Goals: Good    Frequency Min 3X/week     Co-evaluation               AM-PAC PT "6 Clicks" Mobility  Outcome Measure Help needed turning from your back to your side while in a flat bed without using bedrails?: None Help needed moving from lying on your back to sitting on the side of a flat bed without using bedrails?: None Help needed moving to and from a bed to a chair (including a wheelchair)?: A Little Help needed standing up from a chair using your arms (e.g., wheelchair or bedside chair)?: A Little Help needed to walk in hospital room?: A Little Help needed climbing 3-5 steps with a railing? : A Little 6 Click Score: 20    End of Session Equipment Utilized During Treatment: Gait belt Activity Tolerance: Patient tolerated treatment well;Patient limited by pain Patient left: in chair;with call bell/phone within reach Nurse  Communication: Mobility status PT Visit Diagnosis: Unsteadiness on feet (R26.81);Other abnormalities of gait and mobility (R26.89);History of falling (Z91.81);Pain Pain - part of body:  (back, left hip, left ribs)    Time: 9604-5409 PT Time Calculation (min) (ACUTE ONLY): 30 min   Charges:   PT Evaluation $PT Eval Moderate Complexity: 1 Mod PT Treatments $Therapeutic Activity: 8-22 mins PT General Charges $$ ACUTE PT VISIT: 1 Visit        2:56 PM, 12/13/23 Rosamaria Lints, PT, DPT Physical Therapist - [facility]  [phone] (ASCOM)    Irvin Lizama C 12/13/2023, 2:41 PM

## 2023-12-14 ENCOUNTER — Telehealth (HOSPITAL_COMMUNITY): Payer: Self-pay | Admitting: Pharmacy Technician

## 2023-12-14 DIAGNOSIS — E871 Hypo-osmolality and hyponatremia: Secondary | ICD-10-CM | POA: Diagnosis not present

## 2023-12-14 LAB — BASIC METABOLIC PANEL
Anion gap: 8 (ref 5–15)
BUN: 13 mg/dL (ref 8–23)
CO2: 24 mmol/L (ref 22–32)
Calcium: 8.8 mg/dL — ABNORMAL LOW (ref 8.9–10.3)
Chloride: 105 mmol/L (ref 98–111)
Creatinine, Ser: 0.79 mg/dL (ref 0.44–1.00)
GFR, Estimated: >60 mL/min (ref >60)
Glucose, Bld: 105 mg/dL — ABNORMAL HIGH (ref 70–99)
Potassium: 4.3 mmol/L (ref 3.5–5.1)
Sodium: 137 mmol/L (ref 135–145)

## 2023-12-14 MED ORDER — DM-GUAIFENESIN ER 30-600 MG PO TB12: 1.0000 | ORAL_TABLET | Freq: Two times a day (BID) | ORAL | 0 refills | Status: AC

## 2023-12-14 MED ORDER — AMOXICILLIN-POT CLAVULANATE 875-125 MG PO TABS: 1.0000 | ORAL_TABLET | Freq: Two times a day (BID) | ORAL | 0 refills | Status: AC

## 2023-12-14 MED ORDER — AMIODARONE HCL 200 MG PO TABS: ORAL_TABLET | ORAL | Status: DC

## 2023-12-14 MED ORDER — NYSTATIN 100000 UNIT/ML MT SUSP: 5.0000 mL | Freq: Four times a day (QID) | OROMUCOSAL | 0 refills | Status: DC

## 2023-12-14 MED ORDER — LIDOCAINE 5 % EX PTCH: 1.0000 | MEDICATED_PATCH | CUTANEOUS | 0 refills | Status: DC

## 2023-12-14 NOTE — Progress Notes (Signed)
 Mobility Specialist - Progress Note  Pre-mobility: HR 89, SpO2 97% During mobility: HR 98, SpO2 96%   12/14/23 1003  Mobility  Activity Ambulated with assistance in hallway  Level of Assistance Standby assist, set-up cues, supervision of patient - no hands on  Assistive Device Front wheel walker  Distance Ambulated (ft) 160 ft  Activity Response Tolerated well  Mobility visit 1 Mobility  Mobility Specialist Start Time (ACUTE ONLY) 0935  Mobility Specialist Stop Time (ACUTE ONLY) 0951  Mobility Specialist Time Calculation (min) (ACUTE ONLY) 16 min   Pt semi supine upon entry, utilizing RA. Pt motivated and agreeable to OOB amb this date. Pt completed bed mob and STS to RW ModI, expressed 7-8/10 L sided pain upon standing. Pt amb one lap around the NS with SBA, stopping once for a standing rest break (energy conservation)-- no hands on required. During amb Pt expressed pain subsiding some during amb, reporting feeling better today and stronger. Pt returned to the room, left semi fowler with needs within reach.    Zetta Bills Mobility Specialist 12/14/23 10:27 AM

## 2023-12-14 NOTE — Plan of Care (Signed)

## 2023-12-14 NOTE — Telephone Encounter (Signed)
 Pharmacy Patient Advocate Encounter   Received notification from Fax that prior authorization for Lidocaine 5% patches is required/requested.   Insurance verification completed.   The patient is insured through [facility] ADVANTAGE/RX ADVANCE .   Per test claim: PA required; PA submitted to above mentioned insurance via CoverMyMeds Key/confirmation #/EOC [id number] Status is pending

## 2023-12-14 NOTE — Telephone Encounter (Signed)
 Pharmacy Patient Advocate Encounter  Received notification from [facility] ADVANTAGE/RX ADVANCE that Prior Authorization for Lidocaine 5% patches  has been DENIED.  Full denial letter will be uploaded to the media tab. See denial reason below. 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  PA #/Case ID/Reference #: [id number]

## 2023-12-14 NOTE — Progress Notes (Signed)
 [facility]  ROUNDING NOTE   Subjective:   Patient seen sitting up in bed Breakfast tray at bedside States she feels well today Hoping to discharge today   Sodium remains 137   Objective:  Vital signs in last 24 hours:  Temp:  [97.9 F (36.6 C)-99 F (37.2 C)] 97.9 F (36.6 C) (03/07 0751) Pulse Rate:  [76-89] 76 (03/07 0751) Resp:  [16-20] 16 (03/07 0751) BP: (86-144)/(47-76) 144/76 (03/07 0751) SpO2:  [95 %-100 %] 96 % (03/07 0751)  Weight change:  Filed Weights   12/11/23 1918  Weight: 57.6 kg    Intake/Output: I/O last 3 completed shifts: In: 607.5 [P.O.:350; I.V.:257.5] Out: -    Intake/Output this shift:  No intake/output data recorded.  Physical Exam: General: NAD, sitting up in bed  Head: Normocephalic, atraumatic. Moist oral mucosal membranes  Eyes: Anicteric  Lungs:  Clear to auscultation, normal effort  Heart: Regular rate and rhythm  Abdomen:  Soft, nontender, nondistended  Extremities: No peripheral edema.  Neurologic: Nonfocal, moving all four extremities  Skin: No lesions  Access: None    Basic Metabolic Panel: Recent Labs  Lab 12/11/23 1938 12/12/23 0117 12/12/23 0651 12/12/23 0921 12/12/23 1710 12/13/23 0121 12/13/23 0458 12/14/23 0437  NA 121*   < > 123* 123* 132* 134* 137 137  K 3.9  --  4.0 3.8  --   --  3.9 4.3  CL 90*  --  92* 92*  --   --  107 105  CO2 20*  --  22 23  --   --  24 24  GLUCOSE 96  --  114* 141*  --   --  110* 105*  BUN 10  --  12 11  --   --  8 13  CREATININE 0.91  --  0.95 0.96  --   --  0.87 0.79  CALCIUM 8.8*  --  8.2* 8.2*  --   --  8.6* 8.8*   < > = values in this interval not displayed.    Liver Function Tests: Recent Labs  Lab 12/11/23 1938  AST 25  ALT 21  ALKPHOS 86  BILITOT 0.5  PROT 6.7  ALBUMIN 4.0   No results for input(s): "LIPASE", "AMYLASE" in the last 168 hours. No results for input(s): "AMMONIA" in the last 168 hours.  CBC: Recent Labs  Lab 12/11/23 1938  12/12/23 0651 12/13/23 0458  WBC 10.6* 10.4 8.0  NEUTROABS 8.1*  --   --   HGB 10.0* 8.8* 9.2*  HCT 28.6* 24.8* 26.0*  MCV 96.0 95.8 94.5  PLT 512* 433* 483*    Cardiac Enzymes: No results for input(s): "CKTOTAL", "CKMB", "CKMBINDEX", "TROPONINI" in the last 168 hours.  BNP: Invalid input(s): "POCBNP"  CBG: No results for input(s): "GLUCAP" in the last 168 hours.  Microbiology: Results for orders placed or performed during the hospital encounter of 11/21/23  Resp panel by RT-PCR (RSV, Flu A&B, Covid) Anterior Nasal Swab     Status: None   Collection Time: 11/21/23 11:42 AM   Specimen: Anterior Nasal Swab  Result Value Ref Range Status   SARS Coronavirus 2 by RT PCR NEGATIVE NEGATIVE Final   Influenza A by PCR NEGATIVE NEGATIVE Final   Influenza B by PCR NEGATIVE NEGATIVE Final    Comment: (NOTE) The Xpert Xpress SARS-CoV-2/FLU/RSV plus assay is intended as an aid in the diagnosis of influenza from Nasopharyngeal swab specimens and should not be used as a sole basis for treatment.  Nasal washings and aspirates are unacceptable for Xpert Xpress SARS-CoV-2/FLU/RSV testing.  Fact Sheet for Patients: BloggerCourse.com  Fact Sheet for Healthcare Providers: SeriousBroker.it  This test is not yet approved or cleared by the [place] FDA and has been authorized for detection and/or diagnosis of SARS-CoV-2 by FDA under an Emergency Use Authorization (EUA). This EUA will remain in effect (meaning this test can be used) for the duration of the COVID-19 declaration under Section 564(b)(1) of the Act, 21 U.S.C. section 360bbb-3(b)(1), unless the authorization is terminated or revoked.     Resp Syncytial Virus by PCR NEGATIVE NEGATIVE Final    Comment: (NOTE) Fact Sheet for Patients: BloggerCourse.com  Fact Sheet for Healthcare Providers: SeriousBroker.it  This test is not  yet approved or cleared by the [place] FDA and has been authorized for detection and/or diagnosis of SARS-CoV-2 by FDA under an Emergency Use Authorization (EUA). This EUA will remain in effect (meaning this test can be used) for the duration of the COVID-19 declaration under Section 564(b)(1) of the Act, 21 U.S.C. section 360bbb-3(b)(1), unless the authorization is terminated or revoked.  Performed at [facility], [address] [place], [place] [address]   MRSA Next Gen by PCR, Nasal     Status: None   Collection Time: 11/24/23  2:51 AM   Specimen: Nasal Mucosa; Nasal Swab  Result Value Ref Range Status   MRSA by PCR Next Gen NOT DETECTED NOT DETECTED Final    Comment: (NOTE) The GeneXpert MRSA Assay (FDA approved for NASAL specimens only), is one component of a comprehensive MRSA colonization surveillance program. It is not intended to diagnose MRSA infection nor to guide or monitor treatment for MRSA infections. Test performance is not FDA approved in patients less than 72 years old. Performed at [facility], [address] [place], [place] [address]     Coagulation Studies: No results for input(s): "LABPROT", "INR" in the last 72 hours.  Urinalysis: Recent Labs    12/11/23 2221  COLORURINE YELLOW*  LABSPEC 1.023  PHURINE 6.0  GLUCOSEU 50*  HGBUR SMALL*  BILIRUBINUR NEGATIVE  KETONESUR NEGATIVE  PROTEINUR NEGATIVE  NITRITE NEGATIVE  LEUKOCYTESUR NEGATIVE      Imaging: No results found.    Medications:     amiodarone  200 mg Oral Daily   amoxicillin-clavulanate  1 tablet Oral Q12H   apixaban  5 mg Oral BID   atorvastatin  40 mg Oral Daily   busPIRone  5 mg Oral BID   dextromethorphan-guaiFENesin  1 tablet Oral BID   fluticasone furoate-vilanterol  1 puff Inhalation Daily   And   umeclidinium bromide  1 puff Inhalation Daily   hydrALAZINE  25 mg Oral Q12H   irbesartan  300 mg Oral Daily   lidocaine  1 patch Transdermal Q24H    metoprolol succinate  25 mg Oral Daily   nicotine  21 mg Transdermal Daily   nystatin  5 mL Oral QID   acetaminophen **OR** acetaminophen, albuterol, HYDROcodone-acetaminophen, morphine injection, ondansetron **OR** ondansetron (ZOFRAN) IV  Assessment/ Plan:  Ms. Johnita Palleschi is a 78 y.o.  female with past medical conditions including hypertension, COPD, hyperlipidemia, and anxiety and depression, who was admitted to [facility] on 12/11/2023 for Shortness of breath [R06.02] Hyponatremia [E87.1] Leg edema [R60.0] Right leg swelling [M79.89] Chest pain, unspecified type [R07.9]   Hyponatremia likely secondary to SIADH.  Recent admission for this concern, successfully treated with tolvaptan.  Sodium 121 on admission.  Sodium peaked  to 137 during prior admission.  CT angio chest shows stable small pulmonary nodules, presence of emphysema, and suspicion for infection versus inflammation.  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   Sodium remains stable at 137. Patient encouraged to limit fluids to 40 oz daily. Recommend holding salt tabs at discharge and can assess need outpatient. Will schedule outpatient follow up in our office.     LOS: 3 Avigail Pilling 3/7/202511:02 AM

## 2023-12-14 NOTE — Discharge Summary (Signed)
 Physician Discharge Summary   Patient: Morgan [name] [facility] MRN: [id number] DOB: Dec 11, 1945  Admit date:     12/11/2023  Discharge date: 12/14/2023  Discharge Physician: Pennie Banter   PCP: Doreene Nest, NP   Recommendations at discharge:   Follow up with Primary Care in 1 week Repeat CBC, BMP in 1 week - follow up hyponatremia Follow up on healing of rib fractures Follow up on recovery of suspected pneumonia in setting of rib fractures   Discharge Diagnoses: Principal Problem:   Hyponatremia, recurrent Active Problems:   Bilateral lower extremity edema   Acute dyspnea   COPD with acute exacerbation (HCC)   Atrial fibrillation (HCC)   Essential hypertension   Anxiety and depression   Tobacco use disorder  Resolved Problems:   * No resolved hospital problems. [facility] Course:  HPI on admission 12/11/2023 PM: "78 y.o. female with medical history significant for COPD, HTN, HLD, depression/anxiety, tobacco use , recently admitted from 2/12 to 12/01/2023 with delirium of multifactorial etiology with stay complicated by development of new onset rapid A-fib required DCCV and amiodarone, SIADH related hyponatremia treated with tolvaptan, and acute urinary retention briefly requiring Foley, who presents to the ED with difficulty breathing and lower extremity edema associated with intermittent chest pain.  She was seen by her PCP  on 2/26 and sodium was 124. Marland Kitchen.." See H&P for full HPI on admission & ED course.   Further hospital course and management as outlined below.     3/5: nephrology gave tolvaptan.  Na 120 >> 123 >> 123.  3/6: Na increased to 132>>134 last evening, nephrology ordered dextrose fluids 3/7: Na 137 and stable.    3/7 - pt feels well, better.  Medically stable, cleared by neprhology and stable for discharge home with home health today.   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 Pt asymptomatic and feeling better. --PCP in 1 week for repeat BMP   Hypotension -- BP's low on 3/5, down to 83/52, MAP was below 65 --3/5 - given 500 cc bolus NS after d/w nephrology.  BP's improved. 3/7 - BP's satble   Acute RIGHT 9th and 10th Rib Fractures Pt admits to recently falling.   --Lidocaine patch --Tylenol PRN --IS and flutter  --PT/OT evaluations - HH recommended and arranged by TOC   Possible LUL Pneumonia - pt with productive cough, CTA chest LUL ground glass opacity, at risk for pneumonia with acute rib fractures, intermittently hypotensive. --Started 5 day course of Augmentin --Mucinex --Pain control for rib fractures as above --Monitor fever curve, CBC, BP   Acute dyspnea COPD with mild acute exacerbation  Trace to 1+ edema lower extremities but otherwise does not appear fluid overloaded No evidence for pneumonia CTA chest ruled out PE --Bronchodilators --Will add steroids if persistent wheezing, but overall seems stable --Monitor respiratory status closely   Bilateral lower extremity edema - suspect third-spacing or venous stasis. Note that pt's albumin was low 2.9 back in February, but has since recovered to normal. Possible venous stasis.  No history of CHF No prior history of CHF, BNP 97, chest x-ray without pulmonary vascular congestion Patient had echocardiogram on 11/21/2023 that  showed EF 60 to 65% and indeterminate diastolic parameters BLE doppler U/S negative for DVT's --Daily weights with intake and output monitoring --Keep legs elevated --No diuresis for now given hyponatremia   Atrial fibrillation  Recent onset in February 2025 requiring  DCCV and amiodarone infusion --Continue amiodarone and metoprolol --Continue Eliquis   COPD with acute exacerbation  Patient was wheezing on arrival to the ED, resolved following 2 DuoNebs --Continue home inhalers with DuoNebs as needed   Chronic back pain / Scoliosis / History of lumbar spondylosis s/p fusion Hx of Vertebral Compression Fractures --Tylenol PRN --Pt does not like narcotics --PT evaluation  --Chart reviewed, pt has been told she is not a surgical candidate for further intervention due to "weak bones"   Tobacco use disorder Patient is a 1 pack-a-day smoker.  Has been smoking since age 13 Counseled on cutting back and quitting-states she was counseled during her recent hospitalization but quitting cold [place] did not work, especially as her Effexor was discontinued due to SIADH --Nicotine patch   Anxiety and depression Venlafaxine recently discontinued due to SIADH with severe hyponatremia --Continue buspirone 5 mg twice daily   Essential hypertension --Continue metoprolol, hydralazine and irbesartan         Consultants: Nephrology Procedures performed: none  Disposition: Home health Diet recommendation:  Cardiac diet DISCHARGE MEDICATION: Allergies as of 12/14/2023       Reactions   Hctz [hydrochlorothiazide] Other (See Comments)   Hyponatremia    Amlodipine Swelling   Ankle/leg swelling   Azithromycin Nausea And Vomiting   Codeine Itching   Makes me crazy        Medication List     STOP taking these medications    sodium chloride 1 g tablet       TAKE these medications    acetaminophen 325 MG tablet Commonly known as: TYLENOL Take 2 tablets (650 mg total) by mouth every 6 (six) hours as needed for mild pain (pain score 1-3) (or Fever >/= 101).   albuterol 108 (90 Base) MCG/ACT inhaler Commonly known as: VENTOLIN HFA INHALE 2 PUFFS BY MOUTH EVERY 4 HOURS AS NEEDED FOR WHEEZE OR FOR SHORTNESS OF BREATH   amiodarone 200 MG  tablet Commonly known as: PACERONE Take 1 tablet (200 mg total) daily.   amoxicillin-clavulanate 875-125 MG tablet Commonly known as: AUGMENTIN Take 1 tablet by mouth every 12 (twelve) hours for 3 days.   atorvastatin 40 MG tablet Commonly known as: LIPITOR Take 1 tablet (40 mg total) by mouth daily.   busPIRone 5 MG tablet Commonly known as: BUSPAR Take 1 tablet (5 mg total) by mouth 2 (two) times daily. For anxiety   Cholecalciferol 125 MCG (5000 UT) capsule Take 5,000 Units by mouth daily.   dextromethorphan-guaiFENesin 30-600 MG 12hr tablet Commonly known as: MUCINEX DM Take 1 tablet by mouth 2 (two) times daily for 7 days.   Eliquis 5 MG Tabs tablet Generic drug: apixaban Take 1 tablet (5 mg total) by mouth 2 (two) times daily.   fluticasone 50 MCG/ACT nasal spray Commonly known as: FLONASE PLACE 1 SPRAY INTO BOTH NOSTRILS 2 (TWO) TIMES DAILY AS NEEDED FOR ALLERGIES OR RHINITIS.   hydrALAZINE 25 MG tablet Commonly known as: APRESOLINE Take 1 tablet (25 mg total) by mouth every 12 (twelve) hours.   ipratropium-albuterol 0.5-2.5 (3) MG/3ML Soln Commonly known as: DUONEB Take 3 mLs by nebulization every 6 (six) hours as needed. What changed: reasons to take this   irbesartan 300 MG tablet Commonly  known as: AVAPRO Take 1 tablet (300 mg total) by mouth daily. For blood pressure.   lidocaine 5 % Commonly known as: LIDODERM Place 1 patch onto the skin daily. Remove & Discard patch within 12 hours or as directed by MD   metoprolol succinate 25 MG 24 hr tablet Commonly known as: TOPROL-XL Take 1 tablet (25 mg total) by mouth daily. For blood pressure.   multivitamin with minerals Tabs tablet Take 1 tablet by mouth daily.   nicotine 7 mg/24hr patch Commonly known as: NICODERM CQ - dosed in mg/24 hr Place 1 patch (7 mg total) onto the skin daily.   nystatin 100000 UNIT/ML suspension Commonly known as: MYCOSTATIN Take 5 mLs (500,000 Units total) by mouth 4  (four) times daily.   omeprazole 20 MG capsule Commonly known as: PRILOSEC Take 1 capsule by mouth every day for heartburn   polyethylene glycol powder 17 GM/SCOOP powder Commonly known as: GLYCOLAX/MIRALAX Mix 17 g into water once to twice daily as needed for constipation.   Trelegy Ellipta 100-62.5-25 MCG/ACT Aepb Generic drug: Fluticasone-Umeclidin-Vilant Inhale 1 puff into the lungs daily.        Follow-up Information     [facility] Follow up.   Why: Home Health PT/OT is arranged with [facility]. They will call you to schedule an appointment.               Discharge Exam: Filed Weights   12/11/23 1918  Weight: 57.6 kg   General exam: awake, alert, no acute distress HEENT: atraumatic, clear conjunctiva, anicteric sclera, moist mucus membranes, hearing grossly normal  Respiratory system: CTAB, no wheezes, rales or rhonchi, normal respiratory effort. Cardiovascular system: normal S1/S2 RRR, no pedal edema.   Gastrointestinal system: soft, NT, ND, no HSM felt, +bowel sounds. Central nervous system: A&O x 3. no gross focal neurologic deficits, normal speech Extremities: moves all, no edema, normal tone Skin: dry, intact, normal temperature Psychiatry: normal mood, congruent affect, judgement and insight appear normal   Condition at discharge: stable  The results of significant diagnostics from this hospitalization (including imaging, microbiology, ancillary and laboratory) are listed below for reference.   Imaging Studies: US Venous Img Lower Bilateral (DVT) Result Date: 12/12/2023 CLINICAL DATA:  Bilateral leg swelling. EXAM: BILATERAL LOWER EXTREMITY VENOUS DOPPLER ULTRASOUND TECHNIQUE: Gray-scale sonography with graded compression, as well as color Doppler and duplex ultrasound were performed to evaluate the lower extremity deep venous systems from the level of the common femoral vein and including the common femoral, femoral, profunda femoral, popliteal and  calf veins including the posterior tibial, peroneal and gastrocnemius veins when visible. The superficial great saphenous vein was also interrogated. Spectral Doppler was utilized to evaluate flow at rest and with distal augmentation maneuvers in the common femoral, femoral and popliteal veins. COMPARISON:  None Available. FINDINGS: RIGHT LOWER EXTREMITY Common Femoral Vein: No evidence of thrombus. Normal compressibility, respiratory phasicity and response to augmentation. Saphenofemoral Junction: No evidence of thrombus. Normal compressibility and flow on color Doppler imaging. Profunda Femoral Vein: No evidence of thrombus. Normal compressibility and flow on color Doppler imaging. Femoral Vein: No evidence of thrombus. Normal compressibility, respiratory phasicity and response to augmentation. Popliteal Vein: No evidence of thrombus. Normal compressibility, respiratory phasicity and response to augmentation. Calf Veins: No evidence of thrombus. Normal compressibility and flow on color Doppler imaging. Superficial Great Saphenous Vein: No evidence of thrombus. Normal compressibility. Venous Reflux:  None. Other Findings: A 3.7 cm x 0.4 cm x 2.1 cm fluid collection is  seen within the soft tissues of the RIGHT popliteal fossa. LEFT LOWER EXTREMITY Common Femoral Vein: No evidence of thrombus. Normal compressibility, respiratory phasicity and response to augmentation. Saphenofemoral Junction: No evidence of thrombus. Normal compressibility and flow on color Doppler imaging. Profunda Femoral Vein: No evidence of thrombus. Normal compressibility and flow on color Doppler imaging. Femoral Vein: No evidence of thrombus. Normal compressibility, respiratory phasicity and response to augmentation. Popliteal Vein: No evidence of thrombus. Normal compressibility, respiratory phasicity and response to augmentation. Calf Veins: No evidence of thrombus. Normal compressibility and flow on color Doppler imaging. Superficial Great  Saphenous Vein: No evidence of thrombus. Normal compressibility. Venous Reflux:  None. Other Findings:  None. IMPRESSION: 1. No evidence of deep venous thrombosis in either lower extremity. 2. Right Baker's cyst. Electronically Signed   By: Aram Candela M.D.   On: 12/12/2023 01:15   CT Angio Chest PE W/Cm &/Or Wo Cm Result Date: 12/11/2023 CLINICAL DATA:  Leg swelling difficulty breathing EXAM: CT ANGIOGRAPHY CHEST WITH CONTRAST TECHNIQUE: Multidetector CT imaging of the chest was performed using the standard protocol during bolus administration of intravenous contrast. Multiplanar CT image reconstructions and MIPs were obtained to evaluate the vascular anatomy. RADIATION DOSE REDUCTION: This exam was performed according to the departmental dose-optimization program which includes automated exposure control, adjustment of the mA and/or kV according to patient size and/or use of iterative reconstruction technique. CONTRAST:  75mL OMNIPAQUE IOHEXOL 350 MG/ML SOLN COMPARISON:  Chest x-ray 12/11/2023, CT chest 06/29/2023 FINDINGS: Cardiovascular: Satisfactory opacification of the pulmonary arteries to the segmental level. No evidence of pulmonary embolism. Moderate aortic atherosclerosis. No aneurysm. Coronary vascular calcification. Upper normal cardiac size. No pericardial effusion Mediastinum/Nodes: Patent trachea. No suspicious thyroid mass. No suspicious lymph nodes. Esophagus within normal limits except for small hiatal hernia Lungs/Pleura: Emphysema. Stable small pulmonary nodules for which no imaging follow-up is recommended. Small focus of ground-glass disease in the subpleural left upper lobe. Mild bibasilar atelectasis. Upper Abdomen: No acute finding Musculoskeletal: Scoliosis and multilevel degenerative changes. Treated compression deformity at T7. Chronic compression deformity at T5. Interval age indeterminate moderate compression deformity at T3. Chronic superior endplate deformity at T11 and T12.  possible subacute superior endplate deformity at L2, minimal sclerosis and endplate depression, not confidently seen on CT from February 12. Acute mildly displaced right ninth and tenth lateral rib fractures. Review of the MIP images confirms the above findings. IMPRESSION: 1. Negative for acute pulmonary embolism 2. Emphysema. Small focus of ground-glass disease at the left upper lobe likely infectious or inflammatory 3. Interval age indeterminate moderate compression fracture at T3, new since chest CT from September 2024. Possible subacute superior endplate deformity at L2 4. Acute right ninth and tenth rib fractures Aortic Atherosclerosis (ICD10-I70.0) and Emphysema (ICD10-J43.9). Electronically Signed   By: Jasmine Pang M.D.   On: 12/11/2023 23:36   DG Chest Portable 1 View Result Date: 12/11/2023 CLINICAL DATA:  Shortness of breath and leg swelling EXAM: PORTABLE CHEST 1 VIEW COMPARISON:  11/21/2023 FINDINGS: Stable cardiomediastinal silhouette. Aortic atherosclerotic calcification. Bibasilar atelectasis/scarring. Retrocardiac atelectasis or pneumonia. Findings are unchanged from 11/21/2023. No definite pleural effusion. No pneumothorax. No displaced rib fractures. IMPRESSION: Retrocardiac atelectasis or pneumonia.  Unchanged from 11/21/2023. Electronically Signed   By: Minerva Fester M.D.   On: 12/11/2023 21:34   MR BRAIN WO CONTRAST Result Date: 11/23/2023 CLINICAL DATA:  Delirium EXAM: MRI HEAD WITHOUT CONTRAST TECHNIQUE: Multiplanar, multiecho pulse sequences of the brain and surrounding structures were obtained without intravenous contrast. COMPARISON:  Head CT 11/21/2023 FINDINGS: Brain: Diffusion imaging does not show any acute or subacute infarction or other cause of restricted diffusion. Chronic small-vessel ischemic changes affect pons. No focal cerebellar finding. Cerebral hemispheres show moderate chronic small-vessel ischemic change of the white matter. No cortical or large vessel territory  infarction. No mass lesion, hemorrhage, hydrocephalus or extra-axial collection. Vascular: Major vessels at the base of the brain show flow. Skull and upper cervical spine: Negative Sinuses/Orbits: Clear/normal Other: None IMPRESSION: No acute or reversible finding. Moderate chronic small-vessel ischemic changes of the pons and cerebral hemispheric white matter. Electronically Signed   By: Paulina Fusi M.D.   On: 11/23/2023 17:25   ECHOCARDIOGRAM COMPLETE Result Date: 11/23/2023    ECHOCARDIOGRAM REPORT   Patient Name:   AYLINN RYDBERG [facility] Date of Exam: 11/23/2023 Medical Rec #:  [id number]       Height:       61.0 in Accession #:    [id number]      Weight:       125.0 lb Date of Birth:  January 02, 1946      BSA:          1.547 m Patient Age:    77 years        BP:           158/104 mmHg Patient Gender: F               HR:           81 bpm. Exam Location:  Inpatient Procedure: 2D Echo, Cardiac Doppler and Color Doppler (Both Spectral and Color            Flow Doppler were utilized during procedure). Indications:    Atrial Fibrillation I48.91  History:        Patient has no prior history of Echocardiogram examinations.                 COPD; Risk Factors:Hypertension and Diabetes.  Sonographer:    Webb Laws Referring Phys: [id number] ARSHAD N KAKRAKANDY IMPRESSIONS  1. Left ventricular ejection fraction, by estimation, is 60 to 65%. The left ventricle has normal function. The left ventricle has no regional wall motion abnormalities. There is mild left ventricular hypertrophy of the septal segment. Left ventricular diastolic parameters are indeterminate.  2. Right ventricular systolic function is normal. The right ventricular size is normal. There is normal pulmonary artery systolic pressure.  3. The mitral valve is normal in structure. Trivial mitral valve regurgitation. No evidence of mitral stenosis.  4. The aortic valve is tricuspid. Aortic valve regurgitation is not visualized. No aortic stenosis is present.  5. The  inferior vena cava is normal in size with greater than 50% respiratory variability, suggesting right atrial pressure of 3 mmHg. FINDINGS  Left Ventricle: Left ventricular ejection fraction, by estimation, is 60 to 65%. The left ventricle has normal function. The left ventricle has no regional wall motion abnormalities. Strain imaging was not performed. The left ventricular internal cavity  size was normal in size. There is mild left ventricular hypertrophy of the septal segment. Left ventricular diastolic parameters are indeterminate. Indeterminate filling pressures. Right Ventricle: The right ventricular size is normal. No increase in right ventricular wall thickness. Right ventricular systolic function is normal. There is normal pulmonary artery systolic pressure. The tricuspid regurgitant velocity is 2.66 m/s, and  with an assumed right atrial pressure of 3 mmHg, the estimated right ventricular systolic pressure is 31.3 mmHg. Left Atrium: Left atrial size was normal in  size. Right Atrium: Right atrial size was normal in size. Pericardium: There is no evidence of pericardial effusion. Mitral Valve: The mitral valve is normal in structure. Trivial mitral valve regurgitation. No evidence of mitral valve stenosis. Tricuspid Valve: The tricuspid valve is normal in structure. Tricuspid valve regurgitation is trivial. No evidence of tricuspid stenosis. Aortic Valve: The aortic valve is tricuspid. Aortic valve regurgitation is not visualized. No aortic stenosis is present. Pulmonic Valve: The pulmonic valve was normal in structure. Pulmonic valve regurgitation is not visualized. No evidence of pulmonic stenosis. Aorta: The aortic root is normal in size and structure. Venous: The inferior vena cava is normal in size with greater than 50% respiratory variability, suggesting right atrial pressure of 3 mmHg. IAS/Shunts: No atrial level shunt detected by color flow Doppler. Additional Comments: 3D imaging was not performed.   LEFT VENTRICLE PLAX 2D LVIDd:         3.80 cm   Diastology LVIDs:         2.00 cm   LV e' medial:    7.94 cm/s LV PW:         1.00 cm   LV E/e' medial:  9.5 LV IVS:        1.20 cm   LV e' lateral:   9.57 cm/s LVOT diam:     2.00 cm   LV E/e' lateral: 7.9 LV SV:         66 LV SV Index:   42 LVOT Area:     3.14 cm  RIGHT VENTRICLE             IVC RV Basal diam:  3.50 cm     IVC diam: 1.80 cm RV S prime:     15.90 cm/s TAPSE (M-mode): 2.7 cm LEFT ATRIUM             Index        RIGHT ATRIUM           Index LA diam:        3.10 cm 2.00 cm/m   RA Area:     12.00 cm LA Vol (A2C):   25.8 ml 16.68 ml/m  RA Volume:   27.20 ml  17.59 ml/m LA Vol (A4C):   35.9 ml 23.21 ml/m LA Biplane Vol: 30.8 ml 19.91 ml/m  AORTIC VALVE LVOT Vmax:   104.00 cm/s LVOT Vmean:  76.500 cm/s LVOT VTI:    0.209 m  AORTA Ao Root diam: 2.80 cm Ao Asc diam:  3.60 cm MITRAL VALVE               TRICUSPID VALVE MV Area (PHT): 4.06 cm    TR Peak grad:   28.3 mmHg MV Decel Time: 187 msec    TR Vmax:        266.00 cm/s MV E velocity: 75.60 cm/s MV A velocity: 74.50 cm/s  SHUNTS MV E/A ratio:  1.01        Systemic VTI:  0.21 m                            Systemic Diam: 2.00 cm Chilton Si MD Electronically signed by Chilton Si MD Signature Date/Time: 11/23/2023/1:41:57 PM    Final    DG Finger Little Left Result Date: 11/21/2023 CLINICAL DATA:  Assess for mallet finger EXAM: LEFT FINGER(S) - 2+ VIEW COMPARISON:  None Available. FINDINGS: No acute fracture is seen. Volar flexion at the fifth D  IP joint. Mild heterogeneous mineralization possibly due to osteopenia. Mild joint space narrowing at the IP joints. IMPRESSION: No acute osseous abnormality. Volar flexion deformity at the fifth DIP joint. Electronically Signed   By: Jasmine Pang M.D.   On: 11/21/2023 21:19   CT ABDOMEN PELVIS WO CONTRAST Result Date: 11/21/2023 CLINICAL DATA:  Abdomen pain EXAM: CT ABDOMEN AND PELVIS WITHOUT CONTRAST TECHNIQUE: Multidetector CT imaging of the  abdomen and pelvis was performed following the standard protocol without IV contrast. RADIATION DOSE REDUCTION: This exam was performed according to the departmental dose-optimization program which includes automated exposure control, adjustment of the mA and/or kV according to patient size and/or use of iterative reconstruction technique. COMPARISON:  CT 10/30/2019 FINDINGS: Lower chest: Lung bases are clear.  Coronary vascular calcification Hepatobiliary: No focal liver abnormality is seen. No gallstones, gallbladder wall thickening, or biliary dilatation. Pancreas: Unremarkable. No pancreatic ductal dilatation or surrounding inflammatory changes. Spleen: Normal in size without focal abnormality. Adrenals/Urinary Tract: Adrenal glands are normal. Kidneys show no hydronephrosis. The bladder is unremarkable Stomach/Bowel: Stomach nonenlarged. No dilated small bowel. Large stool burden suggesting constipation. No acute bowel wall thickening Vascular/Lymphatic: Aortic atherosclerosis. No enlarged abdominal or pelvic lymph nodes. Reproductive: Uterus and bilateral adnexa are unremarkable. Other: Negative for pelvic effusion or free air Musculoskeletal: Scoliosis and degenerative changes of the spine. Posterior fusion hardware L4 through S1. Post augmentation changes at L3. IMPRESSION: 1. No CT evidence for acute intra-abdominal or pelvic abnormality. 2. Large stool burden suggesting constipation. 3. Aortic atherosclerosis. Aortic Atherosclerosis (ICD10-I70.0). Electronically Signed   By: Jasmine Pang M.D.   On: 11/21/2023 21:16   CT HEAD WO CONTRAST Result Date: 11/21/2023 CLINICAL DATA:  Mental status change, persistent or worsening. EXAM: CT HEAD WITHOUT CONTRAST TECHNIQUE: Contiguous axial images were obtained from the base of the skull through the vertex without intravenous contrast. RADIATION DOSE REDUCTION: This exam was performed according to the departmental dose-optimization program which includes  automated exposure control, adjustment of the mA and/or kV according to patient size and/or use of iterative reconstruction technique. COMPARISON:  06/02/2022 FINDINGS: Brain: No abnormality seen affecting the brainstem or cerebellum. Within the cerebral hemispheres, there is patchy low density in the frontal white matter and external capsule regions consistent with chronic small vessel ischemic change. No cortical or large vessel territory infarction. No mass, hemorrhage, hydrocephalus or extra-axial collection. Vascular: There is atherosclerotic calcification of the major vessels at the base of the brain. Skull: Negative Sinuses/Orbits: Clear/normal Other: None IMPRESSION: No acute CT finding. Chronic small vessel ischemic changes of the white matter. Electronically Signed   By: Paulina Fusi M.D.   On: 11/21/2023 15:54   DG Chest Port 1 View Result Date: 11/21/2023 CLINICAL DATA:  Altered mental status EXAM: PORTABLE CHEST 1 VIEW COMPARISON:  08/08/2023.  Chest CT dated 06/29/2023. FINDINGS: Normal sized heart. Tortuous and partially calcified thoracic aorta. Grossly stable left diaphragmatic eventration with interval ill-defined margins due to minimal adjacent airspace opacity. Small right diaphragmatic eventration. Interval mild right basilar linear atelectasis. Stable moderate dextroconvex thoracolumbar scoliosis and midthoracic kyphoplasty material. Diffuse osteopenia. IMPRESSION: 1. Interval mild right basilar linear atelectasis. 2. Interval minimal left basilar atelectasis or pneumonia. Electronically Signed   By: Beckie Salts M.D.   On: 11/21/2023 14:09    Microbiology: Results for orders placed or performed during the hospital encounter of 11/21/23  Resp panel by RT-PCR (RSV, Flu A&B, Covid) Anterior Nasal Swab     Status: None   Collection Time: 11/21/23 11:42  AM   Specimen: Anterior Nasal Swab  Result Value Ref Range Status   SARS Coronavirus 2 by RT PCR NEGATIVE NEGATIVE Final   Influenza A  by PCR NEGATIVE NEGATIVE Final   Influenza B by PCR NEGATIVE NEGATIVE Final    Comment: (NOTE) The Xpert Xpress SARS-CoV-2/FLU/RSV plus assay is intended as an aid in the diagnosis of influenza from Nasopharyngeal swab specimens and should not be used as a sole basis for treatment. Nasal washings and aspirates are unacceptable for Xpert Xpress SARS-CoV-2/FLU/RSV testing.  Fact Sheet for Patients: BloggerCourse.com  Fact Sheet for Healthcare Providers: SeriousBroker.it  This test is not yet approved or cleared by the [place] FDA and has been authorized for detection and/or diagnosis of SARS-CoV-2 by FDA under an Emergency Use Authorization (EUA). This EUA will remain in effect (meaning this test can be used) for the duration of the COVID-19 declaration under Section 564(b)(1) of the Act, 21 U.S.C. section 360bbb-3(b)(1), unless the authorization is terminated or revoked.     Resp Syncytial Virus by PCR NEGATIVE NEGATIVE Final    Comment: (NOTE) Fact Sheet for Patients: BloggerCourse.com  Fact Sheet for Healthcare Providers: SeriousBroker.it  This test is not yet approved or cleared by the [place] FDA and has been authorized for detection and/or diagnosis of SARS-CoV-2 by FDA under an Emergency Use Authorization (EUA). This EUA will remain in effect (meaning this test can be used) for the duration of the COVID-19 declaration under Section 564(b)(1) of the Act, 21 U.S.C. section 360bbb-3(b)(1), unless the authorization is terminated or revoked.  Performed at [facility], [address] [place], [place] [address]   MRSA Next Gen by PCR, Nasal     Status: None   Collection Time: 11/24/23  2:51 AM   Specimen: Nasal Mucosa; Nasal Swab  Result Value Ref Range Status   MRSA by PCR Next Gen NOT DETECTED NOT DETECTED Final    Comment: (NOTE) The GeneXpert MRSA  Assay (FDA approved for NASAL specimens only), is one component of a comprehensive MRSA colonization surveillance program. It is not intended to diagnose MRSA infection nor to guide or monitor treatment for MRSA infections. Test performance is not FDA approved in patients less than 69 years old. Performed at [facility], [address], [place], [place] [address]     Labs: CBC: Recent Labs  Lab 12/11/23 1938 12/12/23 0651 12/13/23 0458  WBC 10.6* 10.4 8.0  NEUTROABS 8.1*  --   --   HGB 10.0* 8.8* 9.2*  HCT 28.6* 24.8* 26.0*  MCV 96.0 95.8 94.5  PLT 512* 433* 483*   Basic Metabolic Panel: Recent Labs  Lab 12/11/23 1938 12/12/23 0117 12/12/23 0651 12/12/23 0921 12/12/23 1710 12/13/23 0121 12/13/23 0458 12/14/23 0437  NA 121*   < > 123* 123* 132* 134* 137 137  K 3.9  --  4.0 3.8  --   --  3.9 4.3  CL 90*  --  92* 92*  --   --  107 105  CO2 20*  --  22 23  --   --  24 24  GLUCOSE 96  --  114* 141*  --   --  110* 105*  BUN 10  --  12 11  --   --  8 13  CREATININE 0.91  --  0.95 0.96  --   --  0.87 0.79  CALCIUM 8.8*  --  8.2* 8.2*  --   --  8.6* 8.8*   < > =  values in this interval not displayed.   Liver Function Tests: Recent Labs  Lab 12/11/23 1938  AST 25  ALT 21  ALKPHOS 86  BILITOT 0.5  PROT 6.7  ALBUMIN 4.0   CBG: No results for input(s): "GLUCAP" in the last 168 hours.  Discharge time spent: less than 30 minutes.  Signed: Pennie Banter, DO [facility] 12/17/2023

## 2023-12-14 NOTE — TOC Initial Note (Addendum)
 Transition of Care [facility]) - Initial/Assessment Note    Patient Details  Name: Morgan [name] MRN: [id number] Date of Birth: Jan 26, 1946  Transition [facility]) CM/SW Contact:    Erin Sons, LCSW Phone Number: 12/14/2023, 10:44 AM  Clinical Narrative:                  CSW met with pt to discuss therapy recs for [facility]. Pt lives home alone in [place]. She states she will be having a friend coming to stay with her temporarily and her son lives in [place] and checks in on her. No DME needs. She is agreeable to CSW arranging HH. She had [facility] in the past and wants a different agency. She is requesting female therapists if possible and that they call before coming to her home.   CSW arranged HH PT/OT with [facility]. Informed them of her requests. Start of care will be 12/17/23  Expected Discharge Plan: Home w Home Health Services Barriers to Discharge: No Barriers Identified        Expected Discharge Plan and Services         Expected Discharge Date: 12/14/23                           [facility] Agency: [facility] Date [facility] Agency Contacted: 12/14/23 Time HH Agency Contacted: 1044 Representative spoke with at [facility] Agency: Velna Hatchet  Prior Living Arrangements/Services     Patient language and need for interpreter reviewed:: Yes Do you feel safe going back to the place where you live?: Yes      Need for Family Participation in Patient Care: No (Comment) Care giver support system in place?: Yes (comment)   Criminal Activity/Legal Involvement Pertinent to Current Situation/Hospitalization: No - Comment as needed  Activities of Daily Living   ADL Screening (condition at time of admission) Independently performs ADLs?: No Does the patient have a NEW difficulty with bathing/dressing/toileting/self-feeding that is expected to last >3 days?: Yes (Initiates electronic notice to provider for possible OT consult) Does the patient have a NEW difficulty with getting in/out of bed,  walking, or climbing stairs that is expected to last >3 days?: Yes (Initiates electronic notice to provider for possible PT consult) Does the patient have a NEW difficulty with communication that is expected to last >3 days?: No Is the patient deaf or have difficulty hearing?: No Does the patient have difficulty seeing, even when wearing glasses/contacts?: No Does the patient have difficulty concentrating, remembering, or making decisions?: No  Permission Sought/Granted                  Emotional Assessment Appearance:: Appears stated age Attitude/Demeanor/Rapport: Engaged Affect (typically observed): Accepting Orientation: : Oriented to Self, Oriented to Place, Oriented to Situation, Oriented to  Time Alcohol / Substance Use: Not Applicable Psych Involvement: No (comment)  Admission diagnosis:  Shortness of breath [R06.02] Hyponatremia [E87.1] Leg edema [R60.0] Right leg swelling [M79.89] Chest pain, unspecified type [R07.9] Patient Active Problem List   Diagnosis Date Noted   Acute dyspnea 12/11/2023   Atrial fibrillation (HCC) 11/25/2023   Hyponatremia, recurrent 11/21/2023   AKI (acute kidney injury) (HCC) 11/21/2023   Left lower quadrant abdominal pain 11/16/2023   Trigger little finger of left hand 11/16/2023   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   Weakness of left lower extremity 06/01/2022   Thoracic compression fracture (HCC) 05/25/2022   Rash and nonspecific skin eruption 05/10/2022  Bilateral lower extremity edema 12/22/2020   Sleep disturbance 09/23/2020   Preventative health care 03/30/2020   Type 2 diabetes mellitus (HCC) 03/05/2019   Tobacco use disorder 08/09/2018   COPD with acute exacerbation (HCC) 08/09/2018   Osteoporosis 08/09/2018   Medicare annual wellness visit, subsequent 10/11/2016   Constipation 06/28/2016   Kyphoscoliosis and scoliosis 12/09/2015   Back pain 10/28/2015   Atherosclerosis of aorta (HCC)  09/17/2015   Hyperlipidemia 09/08/2015   Essential hypertension 09/08/2015   Anxiety and depression 09/08/2015   GERD (gastroesophageal reflux disease) 09/08/2015   PCP:  Doreene Nest, NP Pharmacy:   [facility] [phone] - [address], [place] - [address] [place] [address] Phone: [phone] Fax: [phone]  [facility] - [place], [place] - [address] [place] [address] Phone: [phone] Fax: [phone]  [facility] [place], [place] - [address] [place] [address] [place] [address] Phone: [phone] Fax: [phone]  [facility] [address] [place] [address] Phone: [phone] Fax: [phone]     Social Drivers of Health (SDOH) Social History: SDOH Screenings   Food Insecurity: No Food Insecurity (12/12/2023)  Housing: Low Risk  (12/12/2023)  Transportation Needs: No Transportation Needs (12/12/2023)  Utilities: Not At Risk (12/12/2023)  Alcohol Screen: Low Risk  (03/21/2023)  Depression (PHQ2-9): High Risk (05/29/2023)  Financial Resource Strain: Low Risk  (03/21/2023)  Physical Activity: Inactive (03/21/2023)  Social Connections: Socially Isolated (12/12/2023)  Stress: No Stress Concern Present (03/21/2023)  Tobacco Use: High Risk (12/12/2023)   SDOH Interventions:     Readmission Risk Interventions    11/23/2023    3:51 PM  Readmission Risk Prevention Plan  Post Dischage Appt Complete  Medication Screening Complete  Transportation Screening Complete

## 2023-12-14 NOTE — Progress Notes (Signed)
 Patient is alert and oriented X 4. Discharge instruction given. No any questions at this time. Peripheral I/v removed.

## 2023-12-17 ENCOUNTER — Telehealth: Payer: Self-pay

## 2023-12-17 NOTE — Transitions of Care (Post Inpatient/ED Visit) (Signed)
   12/17/2023  Name: Morgan [name] [facility] MRN: [id number] DOB: [birth year]  Today's TOC FU Call Status: Today's TOC FU Call Status:: Unsuccessful Call (1st Attempt) Unsuccessful Call (1st Attempt) Date: 12/17/23  Attempted to reach the patient regarding the most recent Inpatient/ED visit.  Follow Up Plan: Additional outreach attempts will be made to reach the patient to complete the Transitions of Care (Post Inpatient/ED visit) call.   Deidre Ala, BSN, RN [place]  VBCI - [employer] [facility] RN Care Manager [phone]

## 2023-12-18 ENCOUNTER — Telehealth: Payer: Self-pay

## 2023-12-18 DIAGNOSIS — E222 Syndrome of inappropriate secretion of antidiuretic hormone: Secondary | ICD-10-CM | POA: Diagnosis not present

## 2023-12-18 DIAGNOSIS — J9601 Acute respiratory failure with hypoxia: Secondary | ICD-10-CM | POA: Diagnosis not present

## 2023-12-18 DIAGNOSIS — E1122 Type 2 diabetes mellitus with diabetic chronic kidney disease: Secondary | ICD-10-CM | POA: Diagnosis not present

## 2023-12-18 DIAGNOSIS — E871 Hypo-osmolality and hyponatremia: Secondary | ICD-10-CM | POA: Diagnosis not present

## 2023-12-18 DIAGNOSIS — N179 Acute kidney failure, unspecified: Secondary | ICD-10-CM | POA: Diagnosis not present

## 2023-12-18 DIAGNOSIS — I110 Hypertensive heart disease with heart failure: Secondary | ICD-10-CM | POA: Diagnosis not present

## 2023-12-18 DIAGNOSIS — J4489 Other specified chronic obstructive pulmonary disease: Secondary | ICD-10-CM | POA: Diagnosis not present

## 2023-12-18 DIAGNOSIS — I5033 Acute on chronic diastolic (congestive) heart failure: Secondary | ICD-10-CM | POA: Diagnosis not present

## 2023-12-18 DIAGNOSIS — D631 Anemia in chronic kidney disease: Secondary | ICD-10-CM | POA: Diagnosis not present

## 2023-12-18 DIAGNOSIS — N183 Chronic kidney disease, stage 3 unspecified: Secondary | ICD-10-CM | POA: Diagnosis not present

## 2023-12-18 NOTE — Telephone Encounter (Signed)
 Copied from CRM [id number]. Topic: Clinical - Home Health Verbal Orders >> Dec 18, 2023  2:21 PM Truddie Crumble wrote: Caller/Agency: gretchen from [facility] Callback Number: [phone] Service Requested: Physical Therapy Frequency: two week four and one week five Any new concerns about the patient? No Patient was taken off her antidepressant medication at the hospital and the doctor may be aware of this

## 2023-12-18 NOTE — Telephone Encounter (Signed)
 Okay to provide verbal orders as requested for physical therapy.

## 2023-12-18 NOTE — Transitions of Care (Post Inpatient/ED Visit) (Signed)
   12/18/2023  Name: Morgan [name] [facility] MRN: [id number] DOB: [birth year]  Today's TOC FU Call Status: Today's TOC FU Call Status:: Unsuccessful Call (2nd Attempt) Unsuccessful Call (1st Attempt) Date: 12/17/23 Unsuccessful Call (2nd Attempt) Date: 12/18/23  Attempted to reach the patient regarding the most recent Inpatient/ED visit.  Follow Up Plan: Additional outreach attempts will be made to reach the patient to complete the Transitions of Care (Post Inpatient/ED visit) call.   Deidre Ala, BSN, RN [place]  VBCI - [employer] [facility] RN Care Manager [phone]

## 2023-12-19 ENCOUNTER — Telehealth: Payer: Self-pay

## 2023-12-19 NOTE — Telephone Encounter (Signed)
Called and advised Morgan [name]  with [facility] of the approval of the requested verbal orders for this patient. Advised to call back with any further questions.

## 2023-12-19 NOTE — Transitions of Care (Post Inpatient/ED Visit) (Signed)
   12/19/2023  Name: Morgan [name] [facility] MRN: [id number] DOB: March 11, 1946  Today's TOC FU Call Status: Today's TOC FU Call Status:: Unsuccessful Call (3rd Attempt) Unsuccessful Call (1st Attempt) Date: 12/17/23 Unsuccessful Call (2nd Attempt) Date: 12/18/23 Unsuccessful Call (3rd Attempt) Date: 12/19/23  Attempted to reach the patient regarding the most recent Inpatient/ED visit.  Follow Up Plan: No further outreach attempts will be made at this time. We have been unable to contact the patient.  Deidre Ala, BSN, RN [place]  VBCI - [employer] [facility] RN Care Manager [phone]

## 2023-12-20 DIAGNOSIS — I10 Essential (primary) hypertension: Secondary | ICD-10-CM | POA: Diagnosis not present

## 2023-12-20 DIAGNOSIS — R609 Edema, unspecified: Secondary | ICD-10-CM | POA: Diagnosis not present

## 2023-12-20 DIAGNOSIS — I4891 Unspecified atrial fibrillation: Secondary | ICD-10-CM | POA: Diagnosis not present

## 2023-12-20 DIAGNOSIS — E871 Hypo-osmolality and hyponatremia: Secondary | ICD-10-CM | POA: Diagnosis not present

## 2023-12-20 DIAGNOSIS — F419 Anxiety disorder, unspecified: Secondary | ICD-10-CM | POA: Diagnosis not present

## 2023-12-20 DIAGNOSIS — E785 Hyperlipidemia, unspecified: Secondary | ICD-10-CM | POA: Diagnosis not present

## 2023-12-20 DIAGNOSIS — J449 Chronic obstructive pulmonary disease, unspecified: Secondary | ICD-10-CM | POA: Diagnosis not present

## 2023-12-20 DIAGNOSIS — N179 Acute kidney failure, unspecified: Secondary | ICD-10-CM | POA: Diagnosis not present

## 2023-12-20 DIAGNOSIS — F32A Depression, unspecified: Secondary | ICD-10-CM | POA: Diagnosis not present

## 2023-12-21 ENCOUNTER — Telehealth: Payer: Self-pay | Admitting: Primary Care

## 2023-12-21 LAB — LAB REPORT - SCANNED: EGFR: 70

## 2023-12-21 NOTE — Telephone Encounter (Signed)
 Copied from CRM [id number]. Topic: Clinical - Home Health Verbal Orders >> Dec 21, 2023 12:55 PM Ernst Spell wrote: Caller/Agency:Ashley Haith w/ [facility] Callback Number: [phone] Service Requested: Occupational Therapy Frequency: 1x/week for 4 weeks Any new concerns about the patient? No

## 2023-12-22 NOTE — Telephone Encounter (Signed)
 Approved.

## 2023-12-24 ENCOUNTER — Ambulatory Visit: Payer: PPO | Attending: Nurse Practitioner | Admitting: Nurse Practitioner

## 2023-12-24 ENCOUNTER — Encounter: Payer: Self-pay | Admitting: Nurse Practitioner

## 2023-12-24 VITALS — BP 156/70 | HR 71 | Ht 61.0 in | Wt 123.2 lb

## 2023-12-24 DIAGNOSIS — E782 Mixed hyperlipidemia: Secondary | ICD-10-CM | POA: Diagnosis not present

## 2023-12-24 DIAGNOSIS — F17200 Nicotine dependence, unspecified, uncomplicated: Secondary | ICD-10-CM | POA: Diagnosis not present

## 2023-12-24 DIAGNOSIS — E871 Hypo-osmolality and hyponatremia: Secondary | ICD-10-CM | POA: Diagnosis not present

## 2023-12-24 DIAGNOSIS — J449 Chronic obstructive pulmonary disease, unspecified: Secondary | ICD-10-CM

## 2023-12-24 DIAGNOSIS — I480 Paroxysmal atrial fibrillation: Secondary | ICD-10-CM

## 2023-12-24 DIAGNOSIS — I10 Essential (primary) hypertension: Secondary | ICD-10-CM

## 2023-12-24 NOTE — Patient Instructions (Addendum)
 Medication Instructions:  Please Stop Amiodarone 200 mg once you've completed your current prescription.   *If you need a refill on your cardiac medications before your next appointment, please call your pharmacy*   Lab Work: NONE ordered at this time of appointment   Testing/Procedures: NONE ordered at this time of appointment   Follow-Up: At [facility], you and your health needs are our priority.  As part of our continuing mission to provide you with exceptional heart care, we have created designated Provider Care Teams.  These Care Teams include your primary Cardiologist (physician) and Advanced Practice Providers (APPs -  Physician Assistants and Nurse Practitioners) who all work together to provide you with the care you need, when you need it.  We recommend signing up for the patient portal called "MyChart".  Sign up information is provided on this After Visit Summary.  MyChart is used to connect with patients for Virtual Visits (Telemedicine).  Patients are able to view lab/test results, encounter notes, upcoming appointments, etc.  Non-urgent messages can be sent to your provider as well.   To learn more about what you can do with MyChart, go to [email or web address].    Your next appointment:   3-4 month(s)  Provider:   Reatha Harps, MD  or Bernadene Person, NP        Other Instructions   1st Floor: - Lobby - Registration  - Pharmacy  - Lab - Cafe  2nd Floor: - PV Lab - Diagnostic Testing (echo, CT, nuclear med)  3rd Floor: - Vacant  4th Floor: - TCTS (cardiothoracic surgery) - AFib Clinic - Structural Heart Clinic - Vascular Surgery  - Vascular Ultrasound  5th Floor: - HeartCare Cardiology (general and EP) - Clinical Pharmacy for coumadin, hypertension, lipid, weight-loss medications, and med management appointments    Valet parking services will be available as well.
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
## 2023-12-24 NOTE — Telephone Encounter (Signed)
 Called and advised Morrie Sheldon with [facility] of the approval of the requested verbal orders for this patient. Advised to call back with any further questions.

## 2023-12-25 ENCOUNTER — Encounter: Payer: Self-pay | Admitting: Nurse Practitioner

## 2024-01-02 ENCOUNTER — Telehealth: Payer: Self-pay | Admitting: Primary Care

## 2024-01-02 DIAGNOSIS — I4891 Unspecified atrial fibrillation: Secondary | ICD-10-CM

## 2024-01-02 DIAGNOSIS — E785 Hyperlipidemia, unspecified: Secondary | ICD-10-CM

## 2024-01-02 NOTE — Telephone Encounter (Signed)
 Copied from CRM [id number]. Topic: Clinical - Medication Refill >> Jan 02, 2024  1:23 PM Aletta Edouard wrote: Most Recent Primary Care Visit:  Provider: Margaree Mackintosh  Department: [facility]  Visit Type: ACUTE  Date: 12/11/2023  Medication: atorvastatin (LIPITOR) 40 MG tablet (ELIQUIS) 5 MG TABS   Has the patient contacted their pharmacy? Yes (Agent: If no, request that the patient contact the pharmacy for the refill. If patient does not wish to contact the pharmacy document the reason why and proceed with request.) (Agent: If yes, when and what did the pharmacy advise?)  Is this the correct pharmacy for this prescription? Yes If no, delete pharmacy and type the correct one.  This is the patient's preferred pharmacy:  [id number], [place] - [address] [place] [address] Phone: [phone] Fax: [phone]   [facility] [address] [place] [address] Phone: [phone] Fax: [phone]   Has the prescription been filled recently? No  Is the patient out of the medication? Yes  Has the patient been seen for an appointment in the last year OR does the patient have an upcoming appointment? Yes  Can we respond through MyChart? No  Agent: Please be advised that Rx refills may take up to 3 business days. We ask that you follow-up with your pharmacy.

## 2024-01-03 MED ORDER — ATORVASTATIN CALCIUM 40 MG PO TABS: 40.0000 mg | ORAL_TABLET | Freq: Every day | ORAL | 1 refills | Status: DC

## 2024-01-03 MED ORDER — APIXABAN 5 MG PO TABS: 5.0000 mg | ORAL_TABLET | Freq: Two times a day (BID) | ORAL | 1 refills | Status: DC

## 2024-01-03 NOTE — Addendum Note (Signed)
 Addended by: Doreene Nest on: 01/03/2024 10:46 AM   Modules accepted: Orders

## 2024-01-03 NOTE — Telephone Encounter (Signed)
 Refills sent to pharmacy.

## 2024-01-07 DIAGNOSIS — N179 Acute kidney failure, unspecified: Secondary | ICD-10-CM | POA: Diagnosis not present

## 2024-01-10 ENCOUNTER — Telehealth: Payer: Self-pay

## 2024-01-10 NOTE — Telephone Encounter (Signed)
 Noted. Can they provide nursing care for the skin tears? She is scheduled to see me on 04/15.

## 2024-01-10 NOTE — Telephone Encounter (Signed)
 Spoke with Morgan [name] they will provide nursing care to skin tears in the meantime.

## 2024-01-10 NOTE — Telephone Encounter (Signed)
 Noted.

## 2024-01-10 NOTE — Telephone Encounter (Signed)
 Copied from CRM [id number]. Topic: Clinical - Medication Question >> Jan 10, 2024  3:06 PM Sonny Dandy B wrote: Reason for CRM: Morgan [name] called from [facility] to report pt has 2 small skin tears on right forearm and rt knee, call to inform the provider. Please call [phone]

## 2024-01-15 ENCOUNTER — Other Ambulatory Visit: Payer: Self-pay | Admitting: Primary Care

## 2024-01-15 ENCOUNTER — Telehealth: Payer: Self-pay

## 2024-01-15 DIAGNOSIS — F32A Anxiety disorder, unspecified: Secondary | ICD-10-CM

## 2024-01-15 DIAGNOSIS — J309 Allergic rhinitis, unspecified: Secondary | ICD-10-CM

## 2024-01-15 NOTE — Telephone Encounter (Signed)
 Spoke to pt, scheduled sooner appt in person for tomorrow, 01/16/24

## 2024-01-15 NOTE — Telephone Encounter (Signed)
 Copied from CRM [id number]. Topic: Appointments - Appointment Cancel/Reschedule >> Jan 15, 2024 10:57 AM Morgan [name] wrote: Patient/patient representative is calling to cancel or reschedule an appointment. Refer to attachments for appointment information.   Patient called stating she want to change her 4/15 appointment to a virtual and also she want to see if there is any medication for diverticulitis. Patient has thrush in her mouth and she want to discuss her leg

## 2024-01-16 ENCOUNTER — Encounter: Payer: Self-pay | Admitting: Primary Care

## 2024-01-16 ENCOUNTER — Ambulatory Visit (INDEPENDENT_AMBULATORY_CARE_PROVIDER_SITE_OTHER): Admitting: Primary Care

## 2024-01-16 VITALS — BP 130/76 | HR 79 | Temp 97.1°F | Ht 61.0 in | Wt 117.0 lb

## 2024-01-16 DIAGNOSIS — L03115 Cellulitis of right lower limb: Secondary | ICD-10-CM

## 2024-01-16 DIAGNOSIS — R1032 Left lower quadrant pain: Secondary | ICD-10-CM | POA: Diagnosis not present

## 2024-01-16 DIAGNOSIS — J449 Chronic obstructive pulmonary disease, unspecified: Secondary | ICD-10-CM | POA: Diagnosis not present

## 2024-01-16 HISTORY — DX: Cellulitis of right lower limb: L03.115

## 2024-01-16 MED ORDER — ALBUTEROL SULFATE HFA 108 (90 BASE) MCG/ACT IN AERS: 2.0000 | INHALATION_SPRAY | Freq: Four times a day (QID) | RESPIRATORY_TRACT | 0 refills | Status: DC | PRN

## 2024-01-16 MED ORDER — DOXYCYCLINE HYCLATE 100 MG PO TABS: 100.0000 mg | ORAL_TABLET | Freq: Two times a day (BID) | ORAL | 0 refills | Status: DC

## 2024-01-16 NOTE — Assessment & Plan Note (Signed)
 Exam today suggestive of cellulitis from recent wound/injury.  Start doxycycline 100 mg.  Take 1 tablet by mouth twice daily x 10 days. Return precautions provided.
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
## 2024-01-16 NOTE — Patient Instructions (Signed)
 Start Doxycycline antibiotic for the infection. Take 1 tablet by mouth twice daily for 10 days.  Take the Miralax only if needed for constipation.   Increase water intake.   Stop smoking!!   It was a pleasure to see you today!

## 2024-01-16 NOTE — Assessment & Plan Note (Signed)
 Improving with improved diet.  Exam today without alarm signs. Continue to work on diet.   Use Miralax PRN.

## 2024-01-16 NOTE — Assessment & Plan Note (Signed)
 Stable.  Strongly advise she quit smoking. Continue Trelegy Ellipta daily. Continue albuterol inhaler as needed.  Refill provided.

## 2024-01-21 ENCOUNTER — Telehealth: Payer: Self-pay

## 2024-01-21 DIAGNOSIS — L03115 Cellulitis of right lower limb: Secondary | ICD-10-CM

## 2024-01-21 MED ORDER — SULFAMETHOXAZOLE-TRIMETHOPRIM 800-160 MG PO TABS: 1.0000 | ORAL_TABLET | Freq: Two times a day (BID) | ORAL | 0 refills | Status: DC

## 2024-01-21 NOTE — Telephone Encounter (Signed)
 Yes, have her stop the Doxycycline. Start Bactrim DS (sulfamethoxazole/trimethoprim) tablets for infection. Take 1 tablet by mouth twice daily for 7 days. I will send this to her pharmacy now.  Does the wound look any better?

## 2024-01-21 NOTE — Addendum Note (Signed)
 Addended by: Alveria Mcglaughlin K on: 01/21/2024 01:56 PM   Modules accepted: Orders

## 2024-01-21 NOTE — Telephone Encounter (Signed)
 Copied from CRM [id number]. Topic: Clinical - Medication Question >> Jan 21, 2024  9:59 AM Bambi Bonine D wrote: Reason for CRM: Pt stated that she would like to see if Dr.Clarke is able to prescribe her a different medication. Pt stated that the doxycycline (VIBRA-TABS) 100 MG tablet is making her nauseous and is throwing up in the morning when she takes the medication.

## 2024-01-21 NOTE — Telephone Encounter (Signed)
 Noted.

## 2024-01-21 NOTE — Telephone Encounter (Signed)
 Called and spoke with with patient. Advised of [employer]. Patient states the wound does look a little better so fa, she just couldn't tolerate the nausea and vomiting that came along with the Doxycycline. She will pick up and begin taking new abx.

## 2024-01-22 ENCOUNTER — Telehealth: Payer: Self-pay

## 2024-01-22 ENCOUNTER — Telehealth: Payer: Self-pay | Admitting: Primary Care

## 2024-01-22 ENCOUNTER — Ambulatory Visit: Admitting: Primary Care

## 2024-01-22 NOTE — Telephone Encounter (Signed)
 Copied from CRM [id number]. Topic: Clinical - Home Health Verbal Orders >> Jan 22, 2024 12:36 PM Melissa C wrote: Caller/Agency: [facility]- 2 callers on call Callback Number: Sherleen Dirks RN is [phone] and Diamond Formica PTA is [phone]  Service Requested: Skilled Nursing Frequency: N/A- just need guidance as stated below  Any new concerns about the patient? Yes- Cecilia reporting wounds on right arm and right leg. Wound on right arm is slightly healed so intervention may not be needed, but the one on right leg has not healed- not weeping, no blood just slightly crusted over. Both are only about 1cm but wanted to advise on care for the leg since it has not healed. Can we go ahead with wound care and are there any further suggestions. Lavonia Powers reported that patient's weight is 116 which is out of set weight parameters.

## 2024-01-22 NOTE — Telephone Encounter (Signed)
 Reviewed cardiology notes from March 2025 which states that amiodarone was only recommended for 1 month in total.  She should have completed her course by now if she started the amiodarone upon discharge (12/14/23).

## 2024-01-22 NOTE — Telephone Encounter (Signed)
 Copied from CRM [id number]. Topic: Clinical - Medication Question >> Jan 22, 2024  1:23 PM Kita Perish H wrote: Reason for CRM: Rosezena Contes is calling to verify if patient should be taking the amiodarone (PACERONE) 200 MG tablet, states its on patients medication list but patient isn't taking it please reach out for some clarity.  Cecila [facility] [phone]

## 2024-01-22 NOTE — Telephone Encounter (Signed)
 I expect no significant improvement in her wound to the leg as her antibiotics were just changed on 01/21/2024.  She needs additional time on these antibiotics.  Yes, okay to proceed with wound care.

## 2024-01-23 NOTE — Telephone Encounter (Signed)
 Cecila called back in, reviewed [employer]. She verbalized understanding.

## 2024-01-23 NOTE — Telephone Encounter (Signed)
 Ceclia called back in, reviewed [place] message. She verbalized understanding.

## 2024-01-23 NOTE — Telephone Encounter (Signed)
 Unable to reach Ceclia. Left voicemail to return call to our office.

## 2024-01-24 NOTE — Telephone Encounter (Signed)
 Called and advised patient, she verbalized understanding and stated she would go to the hospital for IV antibiotic treatment. Called and advised Sherleen Dirks of Onalee Bien recommendation as well, she verbalized she would follow up with the patient as well.

## 2024-01-24 NOTE — Telephone Encounter (Signed)
 Noted.

## 2024-01-24 NOTE — Telephone Encounter (Signed)
 If the wound is worse despite treatment on oral antibiotics then she needs to go the hospital for IV antibiotics/treatment.

## 2024-01-24 NOTE — Telephone Encounter (Addendum)
 Morgan [name] called back in to report that the wound on her right lower has gotten worse. She states the redness around the wound is about 10cm in circumference. Patient is reporting shooting pain that leg. She stated patient is worried about this and anxious that it is getting worse. Patient is taking abx Bactrim as prescribed.   Morgan [name] would like to know what orders she needs to be following to provide wound care, please advise.

## 2024-01-29 ENCOUNTER — Other Ambulatory Visit: Payer: Self-pay | Admitting: Primary Care

## 2024-01-29 DIAGNOSIS — K219 Gastro-esophageal reflux disease without esophagitis: Secondary | ICD-10-CM

## 2024-01-29 NOTE — Telephone Encounter (Unsigned)
 Copied from CRM [id number]. Topic: Clinical - Medication Refill >> Jan 29, 2024  1:32 PM Morgan [name] wrote: Most Recent Primary Care Visit:  Provider: CLARK, KATHERINE K  Department: [facility]  Visit Type: OFFICE VISIT  Date: 01/16/2024  Medication: omeprazole  (PRILOSEC) 20 MG capsule  Has the patient contacted their pharmacy? Yes (Agent: If no, request that the patient contact the pharmacy for the refill. If patient does not wish to contact the pharmacy document the reason why and proceed with request.) (Agent: If yes, when and what did the pharmacy advise?)  Is this the correct pharmacy for this prescription? Yes If no, delete pharmacy and type the correct one.  This is the patient's preferred pharmacy:  [facility] [phone] [place] - [name] [place] [address] Phone: [phone] Fax: [phone]    Has the prescription been filled recently? Yes  Is the patient out of the medication? No  Has the patient been seen for an appointment in the last year OR does the patient have an upcoming appointment? Yes  Can we respond through MyChart? Yes  Agent: Please be advised that Rx refills may take up to 3 business days. We ask that you follow-up with your pharmacy.

## 2024-02-08 ENCOUNTER — Other Ambulatory Visit: Payer: Self-pay | Admitting: Primary Care

## 2024-02-08 DIAGNOSIS — F32A Depression, unspecified: Secondary | ICD-10-CM

## 2024-02-08 DIAGNOSIS — K219 Gastro-esophageal reflux disease without esophagitis: Secondary | ICD-10-CM

## 2024-02-08 DIAGNOSIS — E785 Hyperlipidemia, unspecified: Secondary | ICD-10-CM

## 2024-02-08 DIAGNOSIS — I10 Essential (primary) hypertension: Secondary | ICD-10-CM

## 2024-02-08 NOTE — Telephone Encounter (Signed)
 Copied from CRM [id number]. Topic: Clinical - Medication Refill >> Feb 08, 2024  2:08 PM Aisha D wrote: Most Recent Primary Care Visit:  Provider: CLARK, KATHERINE K  Department: [facility]  Visit Type: OFFICE VISIT  Date: 01/16/2024  Medication: omeprazole  (PRILOSEC) 20 MG capsule, atorvastatin  (LIPITOR) 40 MG tablet, busPIRone  (BUSPAR ) 5 MG tablet, irbesartan  (AVAPRO ) 300 MG tablet, metoprolol  succinate (TOPROL -XL) 25 MG 24 hr tablet  Has the patient contacted their pharmacy? Yes (Agent: If no, request that the patient contact the pharmacy for the refill. If patient does not wish to contact the pharmacy document the reason why and proceed with request.) (Agent: If yes, when and what did the pharmacy advise?)  Is this the correct pharmacy for this prescription? Yes If no, delete pharmacy and type the correct one.  This is the patient's preferred pharmacy:  [facility] [phone] [facility] [place] - [name] [place] [address] Phone: [phone] Fax: [phone]    Has the prescription been filled recently? No  Is the patient out of the medication? Yes  Has the patient been seen for an appointment in the last year OR does the patient have an upcoming appointment? Yes  Can we respond through MyChart? No  Agent: Please be advised that Rx refills may take up to 3 business days. We ask that you follow-up with your pharmacy.

## 2024-02-11 DIAGNOSIS — N179 Acute kidney failure, unspecified: Secondary | ICD-10-CM | POA: Diagnosis not present

## 2024-02-11 MED ORDER — METOPROLOL SUCCINATE ER 25 MG PO TB24: 25.0000 mg | ORAL_TABLET | Freq: Every day | ORAL | 0 refills | Status: DC

## 2024-02-11 MED ORDER — BUSPIRONE HCL 5 MG PO TABS: 5.0000 mg | ORAL_TABLET | Freq: Two times a day (BID) | ORAL | 0 refills | Status: DC

## 2024-02-11 MED ORDER — OMEPRAZOLE 20 MG PO CPDR: DELAYED_RELEASE_CAPSULE | ORAL | 1 refills | Status: DC

## 2024-02-11 MED ORDER — IRBESARTAN 300 MG PO TABS: 300.0000 mg | ORAL_TABLET | Freq: Every day | ORAL | 0 refills | Status: DC

## 2024-02-11 NOTE — Telephone Encounter (Signed)
 Please call patient:  Refill(s) sent to pharmacy except for the atorvastatin  which was sent to her pharmacy on 01/01/2024.

## 2024-02-12 ENCOUNTER — Other Ambulatory Visit: Payer: Self-pay | Admitting: Primary Care

## 2024-02-12 ENCOUNTER — Telehealth: Payer: Self-pay | Admitting: Primary Care

## 2024-02-12 DIAGNOSIS — J449 Chronic obstructive pulmonary disease, unspecified: Secondary | ICD-10-CM

## 2024-02-12 NOTE — Telephone Encounter (Signed)
 You can get an injection while taking Eliquis .

## 2024-02-12 NOTE — Telephone Encounter (Addendum)
 Pt called to schedule ov with Dr. Geralyn Knee for Cortizone injection for left hand pinkie finger. Pt asked since she is currently taking apixaban  (ELIQUIS ) 5 MG TABS tablet, will it be okay for her to proceed with the inj? Pt scheduled ov for tomorrow, 5/7 @ 10:40am. Please advise. Call back # [phone]. Pt mentioned she doesn't have a vm.

## 2024-02-12 NOTE — Telephone Encounter (Signed)
 Morgan [name] notified as instructed by telephone.  Patient states understanding.

## 2024-02-12 NOTE — Progress Notes (Unsigned)
     Morgan Hoog T. Carrell Rahmani, MD, CAQ Sports Medicine [facility] [address] [place], [address]  Phone: [phone]  FAX: [phone]  Morgan [name] See [facility] - 78 y.o. female  MRN [id number]  Date of Birth: August 10, 1946  Date: 02/13/2024  PCP: Gabriel John, NP  Referral: Gabriel John, NP  No chief complaint on file.  Subjective:   Morgan [name] is a 78 y.o. very pleasant female patient with There is no height or weight on file to calculate BMI. who presents with the following:  I initially saw the patient on November 19, 2023 at that point she had a left side fifth digit mallet finger.  I placed her in a stack splint, and she was supposed to follow-up in 6 to 8 weeks, which she did not do.  Today she presents with some ongoing symptoms and pain question possible injection    Review of Systems is noted in the HPI, as appropriate  Objective:   There were no vitals taken for this visit.  GEN: No acute distress; alert,appropriate. PULM: Breathing comfortably in no respiratory distress PSYCH: Normally interactive.   Laboratory and Imaging Data:  Assessment and Plan:   ***

## 2024-02-12 NOTE — Telephone Encounter (Signed)
 Please call patient:  Received refill request for the albuterol  rescue inhaler. I just sent an inhaler one month ago. Is she using her daily inhaler called Trelegy? Does she actually need a refill of albuterol ?

## 2024-02-13 ENCOUNTER — Encounter: Payer: Self-pay | Admitting: Family Medicine

## 2024-02-13 ENCOUNTER — Ambulatory Visit (INDEPENDENT_AMBULATORY_CARE_PROVIDER_SITE_OTHER): Admitting: Family Medicine

## 2024-02-13 VITALS — BP 140/60 | HR 82 | Temp 97.5°F | Ht 61.0 in | Wt 115.2 lb

## 2024-02-13 DIAGNOSIS — L089 Local infection of the skin and subcutaneous tissue, unspecified: Secondary | ICD-10-CM | POA: Diagnosis not present

## 2024-02-13 DIAGNOSIS — M20012 Mallet finger of left finger(s): Secondary | ICD-10-CM

## 2024-02-13 DIAGNOSIS — S60415A Abrasion of left ring finger, initial encounter: Secondary | ICD-10-CM | POA: Diagnosis not present

## 2024-02-13 MED ORDER — CEFTRIAXONE SODIUM 1 G IJ SOLR
1.0000 g | Freq: Once | INTRAMUSCULAR | Status: AC
  Administered 2024-02-13: 1 g via INTRAMUSCULAR

## 2024-02-13 MED ORDER — AMOXICILLIN-POT CLAVULANATE 875-125 MG PO TABS: 1.0000 | ORAL_TABLET | Freq: Two times a day (BID) | ORAL | 0 refills | Status: DC

## 2024-02-15 ENCOUNTER — Ambulatory Visit (INDEPENDENT_AMBULATORY_CARE_PROVIDER_SITE_OTHER): Admitting: Primary Care

## 2024-02-15 ENCOUNTER — Encounter: Payer: Self-pay | Admitting: Primary Care

## 2024-02-15 VITALS — BP 132/86 | HR 78 | Temp 97.3°F | Ht 61.0 in | Wt 117.0 lb

## 2024-02-15 DIAGNOSIS — L089 Local infection of the skin and subcutaneous tissue, unspecified: Secondary | ICD-10-CM | POA: Diagnosis not present

## 2024-02-15 DIAGNOSIS — S60415A Abrasion of left ring finger, initial encounter: Secondary | ICD-10-CM

## 2024-02-15 HISTORY — DX: Local infection of the skin and subcutaneous tissue, unspecified: L08.9

## 2024-02-15 NOTE — Assessment & Plan Note (Signed)
 Not a significant change within 2 days, but there appears to be a reduction in overall swelling. She agrees.  Continue Augmentin  BID x 10 days. I have asked that she upload a picture onto my chart in 3 days for comparison.
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
## 2024-02-15 NOTE — Patient Instructions (Signed)
 Continue taking the Augmentin  antibiotics until complete.  Please upload a picture of your finger to MyChart on Tuesday next week.  It was a pleasure to see you today!

## 2024-02-18 ENCOUNTER — Other Ambulatory Visit (HOSPITAL_COMMUNITY): Payer: Self-pay

## 2024-02-28 ENCOUNTER — Other Ambulatory Visit: Payer: Self-pay | Admitting: Primary Care

## 2024-02-28 DIAGNOSIS — J449 Chronic obstructive pulmonary disease, unspecified: Secondary | ICD-10-CM

## 2024-02-28 DIAGNOSIS — I10 Essential (primary) hypertension: Secondary | ICD-10-CM

## 2024-02-28 DIAGNOSIS — F32A Depression, unspecified: Secondary | ICD-10-CM

## 2024-03-12 DIAGNOSIS — R609 Edema, unspecified: Secondary | ICD-10-CM | POA: Diagnosis not present

## 2024-03-12 DIAGNOSIS — I4891 Unspecified atrial fibrillation: Secondary | ICD-10-CM | POA: Diagnosis not present

## 2024-03-12 DIAGNOSIS — I10 Essential (primary) hypertension: Secondary | ICD-10-CM | POA: Diagnosis not present

## 2024-03-12 DIAGNOSIS — E785 Hyperlipidemia, unspecified: Secondary | ICD-10-CM | POA: Diagnosis not present

## 2024-03-12 DIAGNOSIS — N179 Acute kidney failure, unspecified: Secondary | ICD-10-CM | POA: Diagnosis not present

## 2024-03-12 DIAGNOSIS — F32A Depression, unspecified: Secondary | ICD-10-CM | POA: Diagnosis not present

## 2024-03-12 DIAGNOSIS — F419 Anxiety disorder, unspecified: Secondary | ICD-10-CM | POA: Diagnosis not present

## 2024-03-12 DIAGNOSIS — J449 Chronic obstructive pulmonary disease, unspecified: Secondary | ICD-10-CM | POA: Diagnosis not present

## 2024-03-12 DIAGNOSIS — E871 Hypo-osmolality and hyponatremia: Secondary | ICD-10-CM | POA: Diagnosis not present

## 2024-03-12 LAB — BASIC METABOLIC PANEL WITH GFR
BUN: 17 (ref 4–21)
CO2: 27 — AB (ref 13–22)
Chloride: 102 (ref 99–108)
Creatinine: 1.1 (ref 0.5–1.1)
Glucose: 87
Potassium: 4.7 meq/L (ref 3.5–5.1)
Sodium: 141 (ref 137–147)

## 2024-03-12 LAB — COMPREHENSIVE METABOLIC PANEL WITH GFR
Albumin: 4.4 (ref 3.5–5.0)
Calcium: 9.3 (ref 8.7–10.7)
eGFR: 52

## 2024-03-12 LAB — PROTEIN / CREATININE RATIO, URINE: Creatinine, Urine: 150

## 2024-03-13 LAB — LAB REPORT - SCANNED
Albumin, Urine POC: 28.3
Albumin/Creatinine Ratio, Urine, POC: 19
Creatinine, POC: 150 mg/dL
EGFR: 52

## 2024-03-24 ENCOUNTER — Ambulatory Visit (INDEPENDENT_AMBULATORY_CARE_PROVIDER_SITE_OTHER): Payer: PPO

## 2024-03-24 VITALS — BP 132/86 | Ht 61.0 in | Wt 118.0 lb

## 2024-03-24 DIAGNOSIS — Z0000 Encounter for general adult medical examination without abnormal findings: Secondary | ICD-10-CM | POA: Diagnosis not present
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
## 2024-03-24 NOTE — Patient Instructions (Signed)
 Ms. Trego , Thank you for taking time out of your busy schedule to complete your Annual Wellness Visit with me. I enjoyed our conversation and look forward to speaking with you again next year. I, as well as your care team,  appreciate your ongoing commitment to your health goals. Please review the following plan we discussed and let me know if I can assist you in the future. Your Game plan/ To Do List    Referrals: If you haven't heard from the office you've been referred to, please reach out to them at the phone provided.  none Follow up Visits: Next Medicare AWV with our clinical staff: 03/26/2025   Have you seen your provider in the last 6 months (3 months if uncontrolled diabetes)? No Next Office Visit with your provider: n/a  Clinician Recommendations:  Aim for 30 minutes of exercise or brisk walking, 6-8 glasses of water, and 5 servings of fruits and vegetables each day.       This is a list of the screening recommended for you and due dates:  Health Maintenance  Topic Date Due   Complete foot exam   Never done   Eye exam for diabetics  Never done   Hemoglobin A1C  11/29/2023   DTaP/Tdap/Td vaccine (1 - Tdap) 12/04/2024*   Flu Shot  05/09/2024   Screening for Lung Cancer  12/10/2024   Yearly kidney function blood test for diabetes  03/13/2025   Yearly kidney health urinalysis for diabetes  03/13/2025   Medicare Annual Wellness Visit  03/24/2025   Pneumococcal Vaccine for age over 88  Completed   DEXA scan (bone density measurement)  Completed   Hepatitis C Screening  Completed   HPV Vaccine  Aged Out   Meningitis B Vaccine  Aged Out   COVID-19 Vaccine  Discontinued   Cologuard (Stool DNA test)  Discontinued   Zoster (Shingles) Vaccine  Discontinued  *Topic was postponed. The date shown is not the original due date.    Advanced directives: (Declined) Advance directive discussed with you today. Even though you declined this today, please call our office should you change  your mind, and we can give you the proper paperwork for you to fill out. Advance Care Planning is important because it:  [x]  Makes sure you receive the medical care that is consistent with your values, goals, and preferences  [x]  It provides guidance to your family and loved ones and reduces their decisional burden about whether or not they are making the right decisions based on your wishes.  Follow the link provided in your after visit summary or read over the paperwork we have mailed to you to help you started getting your Advance Directives in place. If you need assistance in completing these, please reach out to us  so that we can help you!  See attachments for Preventive Care and Fall Prevention Tips.

## 2024-03-26 ENCOUNTER — Ambulatory Visit: Attending: Cardiology | Admitting: Nurse Practitioner

## 2024-03-26 ENCOUNTER — Encounter: Payer: Self-pay | Admitting: Nurse Practitioner

## 2024-03-26 VITALS — BP 130/78 | HR 68 | Ht 61.0 in | Wt 119.0 lb

## 2024-03-26 DIAGNOSIS — E871 Hypo-osmolality and hyponatremia: Secondary | ICD-10-CM | POA: Diagnosis not present

## 2024-03-26 DIAGNOSIS — I10 Essential (primary) hypertension: Secondary | ICD-10-CM

## 2024-03-26 DIAGNOSIS — J449 Chronic obstructive pulmonary disease, unspecified: Secondary | ICD-10-CM | POA: Diagnosis not present

## 2024-03-26 DIAGNOSIS — F17200 Nicotine dependence, unspecified, uncomplicated: Secondary | ICD-10-CM | POA: Diagnosis not present

## 2024-03-26 DIAGNOSIS — E782 Mixed hyperlipidemia: Secondary | ICD-10-CM | POA: Diagnosis not present

## 2024-03-26 DIAGNOSIS — I480 Paroxysmal atrial fibrillation: Secondary | ICD-10-CM | POA: Diagnosis not present

## 2024-03-26 MED ORDER — ROSUVASTATIN CALCIUM 5 MG PO TABS: 5.0000 mg | ORAL_TABLET | Freq: Every day | ORAL | 3 refills | Status: AC
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
## 2024-03-26 NOTE — Patient Instructions (Addendum)
 Medication Instructions:  Stop Lipitor 40 mg as directed  Start Crestor 5 mg daily  *If you need a refill on your cardiac medications before your next appointment, please call your pharmacy*  Lab Work: Fasting Lipid panel & CMET in 6 weeks  Testing/Procedures: NONE ordered at this time of appointment   Follow-Up: At [facility], you and your health needs are our priority.  As part of our continuing mission to provide you with exceptional heart care, our providers are all part of one team.  This team includes your primary Cardiologist (physician) and Advanced Practice Providers or APPs (Physician Assistants and Nurse Practitioners) who all work together to provide you with the care you need, when you need it.  Your next appointment:   6 month(s)  Provider:   Oneil Bigness, MD or Marlana Silvan, NP          We recommend signing up for the patient portal called MyChart.  Sign up information is provided on this After Visit Summary.  MyChart is used to connect with patients for Virtual Visits (Telemedicine).  Patients are able to view lab/test results, encounter notes, upcoming appointments, etc.  Non-urgent messages can be sent to your provider as well.   To learn more about what you can do with MyChart, go to [email or web address].

## 2024-03-31 ENCOUNTER — Ambulatory Visit: Payer: Self-pay

## 2024-03-31 NOTE — Telephone Encounter (Signed)
 Noted, will evaluate.

## 2024-03-31 NOTE — Telephone Encounter (Signed)
 FYI Only or Action Required?: FYI only for provider.  Patient was last seen in primary care on 02/15/2024 by Gretta Comer POUR, NP. Called Nurse Triage reporting Depression. Symptoms began several months ago. Interventions attempted: Rest, hydration, or home remedies. Symptoms are: depression (decreased energy, increased sleeping), generalized joint pain gradually worsening.  Triage Disposition: See Physician Within 24 Hours  Patient/caregiver understands and will follow disposition?: Yes                     Summary: antidepression medication   Reason for Triage: Patient has been seen previously for depression but her PCP. States in February she was in the hospital and they took her off her antidepressant medication. Thought she would be okay but as time has gone on she feels like she needs to be back on it.         Reason for Disposition  [1] Depression AND [2] worsening (e.g., sleeping poorly, less able to do activities of daily living)  Answer Assessment - Initial Assessment Questions 1. CONCERN: What happened that made you call today?     Patient was taken off her antidepressant in February when she was in the hospital and she states lately she just wants to stay in bed and do nothing, she feels like if she gets back on the medication it could help.  2. DEPRESSION SYMPTOM SCREENING: How are you feeling overall? (e.g., decreased energy, increased sleeping or difficulty sleeping, difficulty concentrating, feelings of sadness, guilt, hopelessness, or worthlessness)     Decreased energy, increased sleeping.  3. RISK OF HARM - SUICIDAL IDEATION:  Do you ever have thoughts of hurting or killing yourself?  (e.g., yes, no, no but preoccupation with thoughts about death)   - INTENT:  Do you have thoughts of hurting or killing yourself right NOW? (e.g., yes, no, N/A)   - PLAN: Do you have a specific plan for how you would do this? (e.g., gun, knife, overdose, no plan,  N/A)     No.  4. RISK OF HARM - HOMICIDAL IDEATION:  Do you ever have thoughts of hurting or killing someone else?  (e.g., yes, no, no but preoccupation with thoughts about death)   - INTENT:  Do you have thoughts of hurting or killing someone right NOW? (e.g., yes, no, N/A)   - PLAN: Do you have a specific plan for how you would do this? (e.g., gun, knife, no plan, N/A)      No.  5. FUNCTIONAL IMPAIRMENT: How have things been going for you overall? Have you had more difficulty than usual doing your normal daily activities?  (e.g., better, same, worse; self-care, school, work, interactions)     She states she can still do normal daily activities and self care.  6. SUPPORT: Who is with you now? Who do you live with? Do you have family or friends who you can talk to?      Lives home alone, her husband passed away a year ago. She states she does not have anyone but her son.  7. THERAPIST: Do you have a counselor or therapist? Name?     No.  8. STRESSORS: Has there been any new stress or recent changes in your life?     No.  9. ALCOHOL USE OR SUBSTANCE USE (DRUG USE): Do you drink alcohol or use any illegal drugs?     She states she drinks a couple beers in the evening. Patient denies drug use.  10. OTHER: Do  you have any other physical symptoms right now? (e.g., fever)       Generalized joint pain.  11. PREGNANCY: Is there any chance you are pregnant? When was your last menstrual period?       N/A.  Protocols used: Depression-A-AH

## 2024-04-01 ENCOUNTER — Encounter: Payer: Self-pay | Admitting: Primary Care

## 2024-04-01 ENCOUNTER — Ambulatory Visit (INDEPENDENT_AMBULATORY_CARE_PROVIDER_SITE_OTHER): Admitting: Primary Care

## 2024-04-01 VITALS — BP 118/72 | HR 77 | Temp 97.2°F | Ht 61.0 in | Wt 117.0 lb

## 2024-04-01 DIAGNOSIS — F32A Depression, unspecified: Secondary | ICD-10-CM

## 2024-04-01 DIAGNOSIS — F419 Anxiety disorder, unspecified: Secondary | ICD-10-CM | POA: Diagnosis not present

## 2024-04-01 MED ORDER — VENLAFAXINE HCL ER 37.5 MG PO CP24: 37.5000 mg | ORAL_CAPSULE | Freq: Every day | ORAL | 0 refills | Status: DC

## 2024-04-01 NOTE — Assessment & Plan Note (Signed)
 Deteriorated.   Unclear if venlafaxine  ER caused hyponatremia. Reviewed sodium level from [facility] from June 2025.  Start venlafaxine  ER 37.5 mg once daily. Continue buspirone  5 mg twice daily for now.  May discontinue in the future.  Follow up in 1 month for repeat sodium level and follow-up for depression.

## 2024-04-01 NOTE — Patient Instructions (Signed)
 Start venlafaxine  ER 37.5 mg once daily for depression and anxiety.  Schedule a follow-up visit in 1 month.  It was a pleasure to see you today!
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
## 2024-04-06 ENCOUNTER — Other Ambulatory Visit: Payer: Self-pay | Admitting: Primary Care

## 2024-04-06 DIAGNOSIS — F32A Depression, unspecified: Secondary | ICD-10-CM

## 2024-04-23 DIAGNOSIS — E782 Mixed hyperlipidemia: Secondary | ICD-10-CM | POA: Diagnosis not present

## 2024-04-23 DIAGNOSIS — I10 Essential (primary) hypertension: Secondary | ICD-10-CM | POA: Diagnosis not present

## 2024-04-23 DIAGNOSIS — J449 Chronic obstructive pulmonary disease, unspecified: Secondary | ICD-10-CM | POA: Diagnosis not present

## 2024-04-23 DIAGNOSIS — I480 Paroxysmal atrial fibrillation: Secondary | ICD-10-CM | POA: Diagnosis not present

## 2024-04-23 DIAGNOSIS — E871 Hypo-osmolality and hyponatremia: Secondary | ICD-10-CM | POA: Diagnosis not present

## 2024-04-23 DIAGNOSIS — F17200 Nicotine dependence, unspecified, uncomplicated: Secondary | ICD-10-CM | POA: Diagnosis not present

## 2024-04-24 LAB — COMPREHENSIVE METABOLIC PANEL WITH GFR
ALT: 13 IU/L (ref 0–32)
AST: 26 IU/L (ref 0–40)
Albumin: 4.5 g/dL (ref 3.8–4.8)
Alkaline Phosphatase: 82 IU/L (ref 44–121)
BUN/Creatinine Ratio: 19 (ref 12–28)
BUN: 14 mg/dL (ref 8–27)
Bilirubin Total: 0.5 mg/dL (ref 0.0–1.2)
CO2: 22 mmol/L (ref 20–29)
Calcium: 9.7 mg/dL (ref 8.7–10.3)
Chloride: 94 mmol/L — ABNORMAL LOW (ref 96–106)
Creatinine, Ser: 0.72 mg/dL (ref 0.57–1.00)
Globulin, Total: 2 g/dL (ref 1.5–4.5)
Glucose: 84 mg/dL (ref 70–99)
Potassium: 4.3 mmol/L (ref 3.5–5.2)
Sodium: 134 mmol/L (ref 134–144)
Total Protein: 6.5 g/dL (ref 6.0–8.5)
eGFR: 87 mL/min/1.73 (ref >59)

## 2024-04-24 LAB — LIPID PANEL
Chol/HDL Ratio: 1.7 ratio (ref 0.0–4.4)
Cholesterol, Total: 211 mg/dL — ABNORMAL HIGH (ref 100–199)
HDL: 123 mg/dL (ref >39)
LDL Chol Calc (NIH): 75 mg/dL (ref 0–99)
Triglycerides: 73 mg/dL (ref 0–149)
VLDL Cholesterol Cal: 13 mg/dL (ref 5–40)

## 2024-04-28 ENCOUNTER — Ambulatory Visit: Payer: Self-pay | Admitting: Nurse Practitioner

## 2024-05-01 ENCOUNTER — Ambulatory Visit: Admitting: Primary Care

## 2024-05-09 ENCOUNTER — Other Ambulatory Visit: Payer: Self-pay | Admitting: Primary Care

## 2024-05-09 DIAGNOSIS — I10 Essential (primary) hypertension: Secondary | ICD-10-CM

## 2024-05-09 NOTE — Telephone Encounter (Signed)
 Lvm to schedule appt and sent mychart

## 2024-05-09 NOTE — Telephone Encounter (Signed)
Patient is due for CPE/follow up in late August, this will be required prior to any further refills.  Please schedule, thank you!   

## 2024-06-13 ENCOUNTER — Other Ambulatory Visit: Payer: Self-pay | Admitting: Primary Care

## 2024-06-13 DIAGNOSIS — I10 Essential (primary) hypertension: Secondary | ICD-10-CM

## 2024-06-13 NOTE — Telephone Encounter (Signed)
Patient is due for CPE/follow up in October, this will be required prior to any further refills.  Please schedule, thank you!

## 2024-06-16 ENCOUNTER — Other Ambulatory Visit: Payer: Self-pay | Admitting: Primary Care

## 2024-06-16 DIAGNOSIS — F419 Anxiety disorder, unspecified: Secondary | ICD-10-CM

## 2024-06-24 ENCOUNTER — Other Ambulatory Visit: Payer: Self-pay | Admitting: Primary Care

## 2024-06-24 DIAGNOSIS — I10 Essential (primary) hypertension: Secondary | ICD-10-CM

## 2024-06-24 DIAGNOSIS — F419 Anxiety disorder, unspecified: Secondary | ICD-10-CM

## 2024-06-24 DIAGNOSIS — K219 Gastro-esophageal reflux disease without esophagitis: Secondary | ICD-10-CM

## 2024-06-25 ENCOUNTER — Encounter: Admitting: Primary Care

## 2024-06-30 ENCOUNTER — Ambulatory Visit: Admission: RE | Admit: 2024-06-30 | Source: Ambulatory Visit

## 2024-07-09 DIAGNOSIS — M47816 Spondylosis without myelopathy or radiculopathy, lumbar region: Secondary | ICD-10-CM | POA: Diagnosis not present

## 2024-07-16 ENCOUNTER — Encounter: Payer: Self-pay | Admitting: Primary Care

## 2024-07-16 ENCOUNTER — Ambulatory Visit: Admitting: Primary Care

## 2024-07-16 VITALS — BP 136/82 | HR 55 | Temp 97.8°F | Ht 61.0 in | Wt 124.0 lb

## 2024-07-16 DIAGNOSIS — D472 Monoclonal gammopathy: Secondary | ICD-10-CM

## 2024-07-16 DIAGNOSIS — H6123 Impacted cerumen, bilateral: Secondary | ICD-10-CM

## 2024-07-16 DIAGNOSIS — F17200 Nicotine dependence, unspecified, uncomplicated: Secondary | ICD-10-CM | POA: Diagnosis not present

## 2024-07-16 DIAGNOSIS — K219 Gastro-esophageal reflux disease without esophagitis: Secondary | ICD-10-CM

## 2024-07-16 DIAGNOSIS — I10 Essential (primary) hypertension: Secondary | ICD-10-CM

## 2024-07-16 DIAGNOSIS — R600 Localized edema: Secondary | ICD-10-CM

## 2024-07-16 DIAGNOSIS — M810 Age-related osteoporosis without current pathological fracture: Secondary | ICD-10-CM

## 2024-07-16 DIAGNOSIS — I480 Paroxysmal atrial fibrillation: Secondary | ICD-10-CM

## 2024-07-16 DIAGNOSIS — J439 Emphysema, unspecified: Secondary | ICD-10-CM | POA: Diagnosis not present

## 2024-07-16 DIAGNOSIS — F419 Anxiety disorder, unspecified: Secondary | ICD-10-CM

## 2024-07-16 DIAGNOSIS — Z0001 Encounter for general adult medical examination with abnormal findings: Secondary | ICD-10-CM | POA: Diagnosis not present

## 2024-07-16 DIAGNOSIS — E871 Hypo-osmolality and hyponatremia: Secondary | ICD-10-CM | POA: Diagnosis not present

## 2024-07-16 DIAGNOSIS — F32A Depression, unspecified: Secondary | ICD-10-CM | POA: Diagnosis not present

## 2024-07-16 DIAGNOSIS — E1165 Type 2 diabetes mellitus with hyperglycemia: Secondary | ICD-10-CM

## 2024-07-16 DIAGNOSIS — E785 Hyperlipidemia, unspecified: Secondary | ICD-10-CM

## 2024-07-16 DIAGNOSIS — H6120 Impacted cerumen, unspecified ear: Secondary | ICD-10-CM | POA: Insufficient documentation

## 2024-07-16 LAB — BASIC METABOLIC PANEL WITH GFR
BUN: 15 mg/dL (ref 6–23)
CO2: 31 meq/L (ref 19–32)
Calcium: 9.1 mg/dL (ref 8.4–10.5)
Chloride: 98 meq/L (ref 96–112)
Creatinine, Ser: 0.75 mg/dL (ref 0.40–1.20)
GFR: 77.13 mL/min (ref >60.00)
Glucose, Bld: 91 mg/dL (ref 70–99)
Potassium: 3.8 meq/L (ref 3.5–5.1)
Sodium: 135 meq/L (ref 135–145)

## 2024-07-16 LAB — HEMOGLOBIN A1C: Hgb A1c MFr Bld: 6.1 % (ref 4.6–6.5)

## 2024-07-16 MED ORDER — VENLAFAXINE HCL ER 75 MG PO CP24: 75.0000 mg | ORAL_CAPSULE | Freq: Every day | ORAL | 3 refills | Status: AC

## 2024-07-16 NOTE — Assessment & Plan Note (Signed)
 Improved.  Reviewed lipid panel from July 2025. Continue rosuvastatin  5 mg daily.

## 2024-07-16 NOTE — Assessment & Plan Note (Signed)
Controlled.   Continue omeprazole 20 mg daily. 

## 2024-07-16 NOTE — Assessment & Plan Note (Signed)
 Immunizations UTD. Influenza vaccine provided today.  Mammogram UTD. Declines bone density scan. Colonoscopy N/A given age  Exam stable. Labs pending.  Follow up in 1 year for repeat physical.

## 2024-07-16 NOTE — Assessment & Plan Note (Signed)
 Commended her on cessation from cigarettes, encouraged that she discontinue vaping.  She will reschedule her CT chest for lung cancer screening program

## 2024-07-16 NOTE — Assessment & Plan Note (Signed)
 Rate and rhythm regular today.  Advised that she resume Eliquis  as prescribed.  We discussed her risk for stroke.  Resume Eliquis  5 mg twice daily.

## 2024-07-16 NOTE — Assessment & Plan Note (Signed)
 Deteriorated.  Increase venlafaxine  ER to 75 mg daily.  New prescription sent to pharmacy. Continue buspirone  5 mg twice daily.  She will update if no improvement.

## 2024-07-16 NOTE — Assessment & Plan Note (Signed)
 Bilateral cerumen impaction identified on exam. Patient consented to irrigation of canals bilaterally.  Bilateral canals irrigated. Patient tolerated well. TM's and canals post irrigation unremarkable.   Discussed home care instructions.

## 2024-07-16 NOTE — Assessment & Plan Note (Signed)
 Controlled.  Continue Trelegy inhaler 100-62.5-25 mcg, 1 puff daily, albuterol  inhaler as needed. She will reschedule her CT chest for lung cancer screening program.

## 2024-07-16 NOTE — Assessment & Plan Note (Signed)
 Overall stable.   Continue irbesartan  300 mg daily, metoprolol  succinate 25 mg daily, furosemide 20 mg as needed BMP pending

## 2024-07-16 NOTE — Patient Instructions (Addendum)
 Stop by the lab prior to leaving today. I will notify you of your results once received.   Increase your dose of venlafaxine  ER to 75 mg daily for anxiety and depression.  I sent a new prescription to your pharmacy.  Do not take the 37.5 mg dose.  Resume your Eliquis  blood thinner medication until you talk to cardiology.  Please schedule a follow up visit for 6 months for a diabetes check.  It was a pleasure to see you today!
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
## 2024-07-16 NOTE — Assessment & Plan Note (Signed)
 Repeat A1c pending.  Remain off treatment She will schedule eye exam  Follow-up in 6 months.

## 2024-07-16 NOTE — Assessment & Plan Note (Signed)
 Appears euvolemic today.  Continue furosemide 20 mg as needed.

## 2024-07-16 NOTE — Assessment & Plan Note (Signed)
 Declines to complete bone density scan despite recommendations.  Continue calcium  and vitamin D . Encourage walking daily

## 2024-07-16 NOTE — Assessment & Plan Note (Signed)
 Following with oncology. Continue annual monitoring.

## 2024-07-16 NOTE — Assessment & Plan Note (Signed)
 Repeat sodium levels ordered and pending.

## 2024-07-17 ENCOUNTER — Ambulatory Visit: Payer: Self-pay | Admitting: Primary Care

## 2024-07-18 ENCOUNTER — Other Ambulatory Visit: Payer: Self-pay | Admitting: Primary Care

## 2024-07-18 DIAGNOSIS — I4891 Unspecified atrial fibrillation: Secondary | ICD-10-CM

## 2024-07-22 DIAGNOSIS — M5416 Radiculopathy, lumbar region: Secondary | ICD-10-CM | POA: Diagnosis not present

## 2024-07-22 DIAGNOSIS — M5116 Intervertebral disc disorders with radiculopathy, lumbar region: Secondary | ICD-10-CM | POA: Diagnosis not present

## 2024-08-06 ENCOUNTER — Other Ambulatory Visit: Payer: Self-pay | Admitting: Acute Care

## 2024-08-06 DIAGNOSIS — F17210 Nicotine dependence, cigarettes, uncomplicated: Secondary | ICD-10-CM

## 2024-08-06 DIAGNOSIS — Z87891 Personal history of nicotine dependence: Secondary | ICD-10-CM

## 2024-08-06 DIAGNOSIS — Z122 Encounter for screening for malignant neoplasm of respiratory organs: Secondary | ICD-10-CM

## 2024-08-16 ENCOUNTER — Other Ambulatory Visit: Payer: Self-pay | Admitting: Primary Care

## 2024-08-16 DIAGNOSIS — I10 Essential (primary) hypertension: Secondary | ICD-10-CM

## 2024-08-18 ENCOUNTER — Ambulatory Visit
Admission: RE | Admit: 2024-08-18 | Discharge: 2024-08-18 | Disposition: A | Discharge status: Home / Self Care | Source: Ambulatory Visit | Attending: Acute Care | Admitting: Acute Care

## 2024-08-18 DIAGNOSIS — Z122 Encounter for screening for malignant neoplasm of respiratory organs: Secondary | ICD-10-CM | POA: Diagnosis not present

## 2024-08-18 DIAGNOSIS — F17210 Nicotine dependence, cigarettes, uncomplicated: Secondary | ICD-10-CM | POA: Insufficient documentation

## 2024-08-18 DIAGNOSIS — Z87891 Personal history of nicotine dependence: Secondary | ICD-10-CM | POA: Insufficient documentation

## 2024-08-22 ENCOUNTER — Other Ambulatory Visit: Payer: Self-pay | Admitting: Primary Care

## 2024-08-22 DIAGNOSIS — K219 Gastro-esophageal reflux disease without esophagitis: Secondary | ICD-10-CM

## 2024-08-22 MED ORDER — OMEPRAZOLE 20 MG PO CPDR: DELAYED_RELEASE_CAPSULE | ORAL | 2 refills | Status: AC

## 2024-08-22 NOTE — Addendum Note (Signed)
 Addended by: SEBASTIAN DANNA GRADE on: 08/22/2024 03:13 PM   Modules accepted: Orders

## 2024-08-22 NOTE — Telephone Encounter (Signed)
 Please call patient and notify her that I never denied her omeprazole  refill.  In fact I never received a refill request for her omeprazole .  I will refill her omeprazole  now.

## 2024-08-22 NOTE — Addendum Note (Signed)
 Addended by: Mahogani Holohan K on: 08/22/2024 04:50 PM   Modules accepted: Orders

## 2024-08-22 NOTE — Telephone Encounter (Signed)
 Copied from CRM #[id number]. Topic: Clinical - Prescription Issue >> Aug 22, 2024 10:24 AM Alfonso ORN wrote: Reason for CRM: pt called to f/u on denied rx omprazole dr 20 mg . Pt says she still needs rx. Advised unable to see order for this rx and see approved orders for   Metoprolol  Succinate 25 MG and Apixaban   . Please clarify with pt

## 2024-08-25 ENCOUNTER — Other Ambulatory Visit: Payer: Self-pay | Admitting: Acute Care

## 2024-08-25 DIAGNOSIS — F17210 Nicotine dependence, cigarettes, uncomplicated: Secondary | ICD-10-CM

## 2024-08-25 DIAGNOSIS — Z87891 Personal history of nicotine dependence: Secondary | ICD-10-CM

## 2024-08-25 DIAGNOSIS — Z122 Encounter for screening for malignant neoplasm of respiratory organs: Secondary | ICD-10-CM

## 2024-08-25 NOTE — Telephone Encounter (Signed)
 Spoke with pt relaying [name]. Pt verbalizes understanding and expresses her thanks.

## 2024-09-18 ENCOUNTER — Telehealth: Payer: Self-pay | Admitting: Primary Care

## 2024-09-18 DIAGNOSIS — I10 Essential (primary) hypertension: Secondary | ICD-10-CM

## 2024-09-18 NOTE — Telephone Encounter (Unsigned)
 Copied from CRM #[id number]. Topic: Clinical - Medication Refill >> Sep 18, 2024  2:08 PM Sasha M wrote: Medication: irbesartan  (AVAPRO ) 300 MG tablet  Has the patient contacted their pharmacy? Yes (Agent: If no, request that the patient contact the pharmacy for the refill. If patient does not wish to contact the pharmacy document the reason why and proceed with request.) (Agent: If yes, when and what did the pharmacy advise?)  This is the patient's preferred pharmacy:  [facility] [phone] [place] - [address] [place] [address] Phone: [phone] Fax: [phone]   Is this the correct pharmacy for this prescription? Yes If no, delete pharmacy and type the correct one.   Has the prescription been filled recently? No  Is the patient out of the medication? No  Has the patient been seen for an appointment in the last year OR does the patient have an upcoming appointment? Yes  Can we respond through MyChart? Yes  Agent: Please be advised that Rx refills may take up to 3 business days. We ask that you follow-up with your pharmacy.

## 2024-10-15 LAB — HM MAMMOGRAPHY

## 2024-10-16 ENCOUNTER — Encounter: Payer: Self-pay | Admitting: Primary Care

## 2024-10-24 ENCOUNTER — Other Ambulatory Visit: Payer: Self-pay | Admitting: Primary Care

## 2024-10-24 DIAGNOSIS — F419 Anxiety disorder, unspecified: Secondary | ICD-10-CM

## 2024-11-09 NOTE — Progress Notes (Unsigned)
" °  Cardiology Office Note:  .   Date:  11/09/2024  ID:  Morgan [name], DOB [birth year], MRN [id number] PCP: Gretta Comer POUR, NP  [facility] Providers Cardiologist:  Darryle ONEIDA Decent, MD   History of Present Illness: .   No chief complaint on file.   Morgan [name] is a 79 y.o. female with below history who presents for follow-up.   History of Present Illness               Problem List Persistent Afib -Dx 2025 in setting sepsis/PNA 2. HTN 3. HLD -T chol 211, HDL 123, LDL 75, TG 73 4. SIADH 5. COPD    ROS: All other ROS reviewed and negative. Pertinent positives noted in the HPI.     Studies Reviewed: SABRA       TTE 11/23/2023  1. Left ventricular ejection fraction, by estimation, is 60 to 65%. The  left ventricle has normal function. The left ventricle has no regional  wall motion abnormalities. There is mild left ventricular hypertrophy of  the septal segment. Left ventricular  diastolic parameters are indeterminate.   2. Right ventricular systolic function is normal. The right ventricular  size is normal. There is normal pulmonary artery systolic pressure.   3. The mitral valve is normal in structure. Trivial mitral valve  regurgitation. No evidence of mitral stenosis.   4. The aortic valve is tricuspid. Aortic valve regurgitation is not  visualized. No aortic stenosis is present.   5. The inferior vena cava is normal in size with greater than 50%  respiratory variability, suggesting right atrial pressure of 3 mmHg.   Physical Exam:   VS:  There were no vitals taken for this visit.   Wt Readings from Last 3 Encounters:  07/16/24 124 lb (56.2 kg)  04/01/24 117 lb (53.1 kg)  03/26/24 119 lb (54 kg)    GEN: Well nourished, well developed in no acute distress NECK: No JVD; No carotid bruits CARDIAC: ***RRR, no murmurs, rubs, gallops RESPIRATORY:  Clear to auscultation without rales, wheezing or rhonchi  ABDOMEN: Soft, non-tender,  non-distended EXTREMITIES:  No edema; No deformity  ASSESSMENT AND PLAN: .   Assessment and Plan                 {Are you ordering a CV Procedure (e.g. stress test, cath, DCCV, TEE, etc)?   Press F2        :[id number]}   Follow-up: No follow-ups on file.  Signed, Darryle ONEIDA. Decent, MD, [facility]  [name]  [address] [place], [place] [address] [phone]  6:33 PM   "

## 2024-11-12 ENCOUNTER — Ambulatory Visit: Admitting: Cardiovascular Disease

## 2024-11-12 DIAGNOSIS — E782 Mixed hyperlipidemia: Secondary | ICD-10-CM

## 2024-11-12 DIAGNOSIS — I480 Paroxysmal atrial fibrillation: Secondary | ICD-10-CM

## 2024-11-12 DIAGNOSIS — I10 Essential (primary) hypertension: Secondary | ICD-10-CM

## 2025-01-09 ENCOUNTER — Ambulatory Visit: Admitting: Cardiovascular Disease

## 2025-03-26 ENCOUNTER — Ambulatory Visit
